# Patient Record
Sex: Male | Born: 1935
Health system: Southern US, Community
[De-identification: ages and names within clinical notes are randomized; demographics above are authoritative.]

## PROBLEM LIST (undated history)

## (undated) DIAGNOSIS — R011 Cardiac murmur, unspecified: Secondary | ICD-10-CM

## (undated) DIAGNOSIS — Z8673 Personal history of transient ischemic attack (TIA), and cerebral infarction without residual deficits: Secondary | ICD-10-CM

## (undated) DIAGNOSIS — T8859XA Other complications of anesthesia, initial encounter: Secondary | ICD-10-CM

## (undated) DIAGNOSIS — R2689 Other abnormalities of gait and mobility: Secondary | ICD-10-CM

## (undated) DIAGNOSIS — R269 Unspecified abnormalities of gait and mobility: Secondary | ICD-10-CM

## (undated) DIAGNOSIS — R112 Nausea with vomiting, unspecified: Secondary | ICD-10-CM

## (undated) DIAGNOSIS — I350 Nonrheumatic aortic (valve) stenosis: Secondary | ICD-10-CM

## (undated) DIAGNOSIS — F0390 Unspecified dementia without behavioral disturbance: Secondary | ICD-10-CM

## (undated) DIAGNOSIS — H409 Unspecified glaucoma: Secondary | ICD-10-CM

## (undated) DIAGNOSIS — Z9889 Other specified postprocedural states: Secondary | ICD-10-CM

## (undated) DIAGNOSIS — I340 Nonrheumatic mitral (valve) insufficiency: Secondary | ICD-10-CM

## (undated) DIAGNOSIS — Z9289 Personal history of other medical treatment: Secondary | ICD-10-CM

## (undated) DIAGNOSIS — E7800 Pure hypercholesterolemia, unspecified: Secondary | ICD-10-CM

## (undated) DIAGNOSIS — K922 Gastrointestinal hemorrhage, unspecified: Secondary | ICD-10-CM

## (undated) DIAGNOSIS — M1990 Unspecified osteoarthritis, unspecified site: Secondary | ICD-10-CM

## (undated) DIAGNOSIS — E785 Hyperlipidemia, unspecified: Secondary | ICD-10-CM

## (undated) DIAGNOSIS — R413 Other amnesia: Secondary | ICD-10-CM

## (undated) DIAGNOSIS — T4145XA Adverse effect of unspecified anesthetic, initial encounter: Secondary | ICD-10-CM

## (undated) DIAGNOSIS — C801 Malignant (primary) neoplasm, unspecified: Secondary | ICD-10-CM

## (undated) DIAGNOSIS — B029 Zoster without complications: Secondary | ICD-10-CM

## (undated) DIAGNOSIS — I10 Essential (primary) hypertension: Secondary | ICD-10-CM

## (undated) DIAGNOSIS — K219 Gastro-esophageal reflux disease without esophagitis: Secondary | ICD-10-CM

## (undated) DIAGNOSIS — I639 Cerebral infarction, unspecified: Secondary | ICD-10-CM

## (undated) DIAGNOSIS — N189 Chronic kidney disease, unspecified: Secondary | ICD-10-CM

## (undated) DIAGNOSIS — D649 Anemia, unspecified: Secondary | ICD-10-CM

## (undated) DIAGNOSIS — W19XXXA Unspecified fall, initial encounter: Secondary | ICD-10-CM

## (undated) HISTORY — DX: Gastro-esophageal reflux disease without esophagitis: K21.9

## (undated) HISTORY — PX: BACK SURGERY: SHX140

## (undated) HISTORY — DX: Hyperlipidemia, unspecified: E78.5

## (undated) HISTORY — DX: Other amnesia: R41.3

## (undated) HISTORY — DX: Unspecified glaucoma: H40.9

## (undated) HISTORY — PX: EYE SURGERY: SHX253

## (undated) HISTORY — DX: Personal history of transient ischemic attack (TIA), and cerebral infarction without residual deficits: Z86.73

## (undated) HISTORY — PX: APPENDECTOMY: SHX54

## (undated) HISTORY — DX: Unspecified dementia, unspecified severity, without behavioral disturbance, psychotic disturbance, mood disturbance, and anxiety: F03.90

## (undated) HISTORY — DX: Unspecified abnormalities of gait and mobility: R26.9

## (undated) HISTORY — DX: Pure hypercholesterolemia, unspecified: E78.00

## (undated) HISTORY — PX: AORTIC VALVE REPLACEMENT: SHX41

## (undated) HISTORY — DX: Anemia, unspecified: D64.9

## (undated) HISTORY — DX: Gastrointestinal hemorrhage, unspecified: K92.2

## (undated) HISTORY — DX: Essential (primary) hypertension: I10

## (undated) HISTORY — DX: Chronic kidney disease, unspecified: N18.9

## (undated) HISTORY — DX: Zoster without complications: B02.9

## (undated) HISTORY — DX: Cerebral infarction, unspecified: I63.9

## (undated) HISTORY — DX: Nonrheumatic mitral (valve) insufficiency: I34.0

## (undated) HISTORY — DX: Nonrheumatic aortic (valve) stenosis: I35.0

## (undated) HISTORY — DX: Unspecified fall, initial encounter: W19.XXXA

---

## 2000-01-19 ENCOUNTER — Ambulatory Visit (HOSPITAL_COMMUNITY): Admission: RE | Admit: 2000-01-19 | Discharge: 2000-01-19 | Payer: Self-pay | Admitting: Neurosurgery

## 2000-01-19 ENCOUNTER — Encounter: Payer: Self-pay | Admitting: Neurosurgery

## 2000-02-05 ENCOUNTER — Ambulatory Visit (HOSPITAL_COMMUNITY): Admission: RE | Admit: 2000-02-05 | Discharge: 2000-02-05 | Payer: Self-pay | Admitting: Neurosurgery

## 2000-02-05 ENCOUNTER — Encounter: Payer: Self-pay | Admitting: Neurosurgery

## 2000-02-19 ENCOUNTER — Ambulatory Visit (HOSPITAL_COMMUNITY): Admission: RE | Admit: 2000-02-19 | Discharge: 2000-02-19 | Payer: Self-pay | Admitting: Neurosurgery

## 2000-02-19 ENCOUNTER — Encounter: Payer: Self-pay | Admitting: Neurosurgery

## 2000-09-18 LAB — HM COLONOSCOPY

## 2000-12-16 ENCOUNTER — Ambulatory Visit (HOSPITAL_COMMUNITY): Admission: RE | Admit: 2000-12-16 | Discharge: 2000-12-16 | Payer: Self-pay | Admitting: Gastroenterology

## 2000-12-16 ENCOUNTER — Encounter (INDEPENDENT_AMBULATORY_CARE_PROVIDER_SITE_OTHER): Payer: Self-pay | Admitting: Specialist

## 2001-10-30 ENCOUNTER — Encounter: Admission: RE | Admit: 2001-10-30 | Discharge: 2001-10-30 | Payer: Self-pay | Admitting: Gastroenterology

## 2001-10-30 ENCOUNTER — Encounter: Payer: Self-pay | Admitting: Gastroenterology

## 2007-01-01 HISTORY — PX: CARDIOVASCULAR STRESS TEST: SHX262

## 2007-12-26 ENCOUNTER — Ambulatory Visit: Payer: Self-pay | Admitting: Vascular Surgery

## 2008-08-13 ENCOUNTER — Emergency Department (HOSPITAL_COMMUNITY): Admission: EM | Admit: 2008-08-13 | Discharge: 2008-08-13 | Payer: Self-pay | Admitting: Emergency Medicine

## 2008-11-03 ENCOUNTER — Encounter: Admission: RE | Admit: 2008-11-03 | Discharge: 2008-11-03 | Payer: Self-pay | Admitting: Neurosurgery

## 2008-11-19 ENCOUNTER — Encounter: Admission: RE | Admit: 2008-11-19 | Discharge: 2008-11-19 | Payer: Self-pay | Admitting: Neurosurgery

## 2009-03-03 ENCOUNTER — Encounter: Admission: RE | Admit: 2009-03-03 | Discharge: 2009-03-03 | Payer: Self-pay | Admitting: Neurosurgery

## 2009-04-22 ENCOUNTER — Encounter: Admission: RE | Admit: 2009-04-22 | Discharge: 2009-04-22 | Payer: Self-pay | Admitting: Neurosurgery

## 2009-06-17 ENCOUNTER — Encounter: Admission: RE | Admit: 2009-06-17 | Discharge: 2009-06-17 | Payer: Self-pay | Admitting: Neurosurgery

## 2009-08-17 ENCOUNTER — Inpatient Hospital Stay (HOSPITAL_COMMUNITY): Admission: RE | Admit: 2009-08-17 | Discharge: 2009-08-21 | Payer: Self-pay | Admitting: Neurosurgery

## 2010-07-04 ENCOUNTER — Encounter: Admission: RE | Admit: 2010-07-04 | Discharge: 2010-07-04 | Payer: Self-pay | Admitting: Endocrinology

## 2010-08-08 ENCOUNTER — Encounter
Admission: RE | Admit: 2010-08-08 | Discharge: 2010-08-08 | Payer: Self-pay | Source: Home / Self Care | Attending: Endocrinology | Admitting: Endocrinology

## 2010-08-23 ENCOUNTER — Ambulatory Visit: Payer: Self-pay | Admitting: Cardiovascular Disease

## 2010-08-27 ENCOUNTER — Encounter: Payer: Self-pay | Admitting: Orthopedic Surgery

## 2010-08-31 ENCOUNTER — Encounter: Payer: Self-pay | Admitting: Cardiovascular Disease

## 2010-08-31 ENCOUNTER — Ambulatory Visit: Admission: RE | Admit: 2010-08-31 | Discharge: 2010-08-31 | Payer: Self-pay | Source: Home / Self Care

## 2010-08-31 ENCOUNTER — Ambulatory Visit (HOSPITAL_COMMUNITY)
Admission: RE | Admit: 2010-08-31 | Discharge: 2010-08-31 | Payer: Self-pay | Source: Home / Self Care | Attending: Cardiovascular Disease | Admitting: Cardiovascular Disease

## 2010-08-31 HISTORY — PX: US ECHOCARDIOGRAPHY: HXRAD669

## 2010-10-19 ENCOUNTER — Ambulatory Visit (INDEPENDENT_AMBULATORY_CARE_PROVIDER_SITE_OTHER): Payer: Medicare Other | Admitting: Cardiovascular Disease

## 2010-10-19 DIAGNOSIS — I359 Nonrheumatic aortic valve disorder, unspecified: Secondary | ICD-10-CM

## 2010-10-19 DIAGNOSIS — R0602 Shortness of breath: Secondary | ICD-10-CM

## 2010-10-22 LAB — MRSA PCR SCREENING: MRSA by PCR: NEGATIVE

## 2010-10-22 LAB — CBC
Hemoglobin: 11.2 g/dL — ABNORMAL LOW (ref 13.0–17.0)
MCHC: 34.9 g/dL (ref 30.0–36.0)
MCV: 97.2 fL (ref 78.0–100.0)
RBC: 3.3 MIL/uL — ABNORMAL LOW (ref 4.22–5.81)
RDW: 13.8 % (ref 11.5–15.5)

## 2010-10-22 LAB — PROTIME-INR
INR: 1.15 (ref 0.00–1.49)
Prothrombin Time: 14.6 seconds (ref 11.6–15.2)

## 2010-10-22 LAB — COMPREHENSIVE METABOLIC PANEL
CO2: 25 mEq/L (ref 19–32)
Calcium: 9.3 mg/dL (ref 8.4–10.5)
Creatinine, Ser: 1.19 mg/dL (ref 0.4–1.5)
GFR calc Af Amer: >60 mL/min (ref >60)
GFR calc non Af Amer: 58 mL/min — ABNORMAL LOW (ref >60)
Glucose, Bld: 80 mg/dL (ref 70–99)
Total Protein: 7.1 g/dL (ref 6.0–8.3)

## 2010-10-22 LAB — DIFFERENTIAL
Lymphocytes Relative: 9 % — ABNORMAL LOW (ref 12–46)
Lymphs Abs: 0.4 10*3/uL — ABNORMAL LOW (ref 0.7–4.0)
Neutrophils Relative %: 79 % — ABNORMAL HIGH (ref 43–77)

## 2010-10-22 LAB — URINALYSIS, ROUTINE W REFLEX MICROSCOPIC
Hgb urine dipstick: NEGATIVE
Nitrite: NEGATIVE
Protein, ur: NEGATIVE mg/dL
Urobilinogen, UA: 1 mg/dL (ref 0.0–1.0)

## 2010-10-22 LAB — APTT: aPTT: 30 seconds (ref 24–37)

## 2010-10-23 ENCOUNTER — Inpatient Hospital Stay (HOSPITAL_BASED_OUTPATIENT_CLINIC_OR_DEPARTMENT_OTHER)
Admission: RE | Admit: 2010-10-23 | Discharge: 2010-10-23 | Disposition: A | Discharge status: Home / Self Care | Payer: Medicare Other | Source: Ambulatory Visit | Attending: Cardiovascular Disease | Admitting: Cardiovascular Disease

## 2010-10-23 DIAGNOSIS — E785 Hyperlipidemia, unspecified: Secondary | ICD-10-CM | POA: Insufficient documentation

## 2010-10-23 DIAGNOSIS — I359 Nonrheumatic aortic valve disorder, unspecified: Secondary | ICD-10-CM | POA: Insufficient documentation

## 2010-10-23 DIAGNOSIS — R0609 Other forms of dyspnea: Secondary | ICD-10-CM | POA: Insufficient documentation

## 2010-10-23 DIAGNOSIS — I10 Essential (primary) hypertension: Secondary | ICD-10-CM | POA: Insufficient documentation

## 2010-10-23 DIAGNOSIS — D649 Anemia, unspecified: Secondary | ICD-10-CM | POA: Insufficient documentation

## 2010-10-23 DIAGNOSIS — R0989 Other specified symptoms and signs involving the circulatory and respiratory systems: Secondary | ICD-10-CM | POA: Insufficient documentation

## 2010-10-23 DIAGNOSIS — R079 Chest pain, unspecified: Secondary | ICD-10-CM | POA: Insufficient documentation

## 2010-10-23 DIAGNOSIS — F0390 Unspecified dementia without behavioral disturbance: Secondary | ICD-10-CM | POA: Insufficient documentation

## 2010-10-23 HISTORY — PX: CARDIAC CATHETERIZATION: SHX172

## 2010-10-23 LAB — POCT I-STAT 3, ART BLOOD GAS (G3+)
Acid-base deficit: 1 mmol/L (ref 0.0–2.0)
Bicarbonate: 23.9 mEq/L (ref 20.0–24.0)

## 2010-10-23 LAB — POCT I-STAT 3, VENOUS BLOOD GAS (G3P V)
Acid-base deficit: 2 mmol/L (ref 0.0–2.0)
pO2, Ven: 34 mmHg (ref 30.0–45.0)

## 2010-10-23 LAB — POCT ACTIVATED CLOTTING TIME: Activated Clotting Time: 163 seconds

## 2010-11-14 NOTE — Procedures (Signed)
NAME:  Bryan Moore, Bryan Moore NO.:  [id number]  MEDICAL RECORD NO.:  [id number]           PATIENT TYPE:  LOCATION:                                 FACILITY:  PHYSICIAN:  Thayer Headings, M.D. DATE OF BIRTH:  January 16, 1920  DATE OF PROCEDURE:  10/23/2010 DATE OF DISCHARGE:                           CARDIAC CATHETERIZATION   PROCEDURE:  Right and left heart catheterization with coronary angiography and measurement of right heart pressures.  We will also do a left ventriculography.  HISTORY:  Mr. Caselli is an 77 year old gentleman with a history of mild dementia, hypertension, anemia, and hyperlipidemia.  He was recently found to have moderate-to-severe aortic stenosis.  He has developed symptoms consistent with aortic stenosis including chest pain and severe dyspnea on exertion.  We referred him for heart catheterization for further evaluation of his coronary arteries.  Our anticipation is that he hopefully can be referred for transcatheter aortic valve replacement.  PROCEDURE:  Right left heart catheterization with coronary angiography and measurement of right heart cath pressures.  The right femoral artery and the right femoral vein were easily cannulated using modified Seldinger technique.  HEMODYNAMICS:  The right atrial pressure is 11/9 with a mean of 7. Right ventricular pressure is 37/4 with a mean of 11.  Pulmonary capillary wedge pressure is 10.  Pulmonary artery pressure is 39/14 with a mean of 24.  His AO saturation is 94%, the PA saturation is 60%.  His cardiac output by thermodilution is 3.7 liters per minute.  The cardiac output by Fick calculation is 4.4 liters per minute.  His left ventricular pressure is 155/18.  On pullback, his aortic pressure was 129/55.  His aortic valve area by thermodilution is 0.69 cm2.  His aortic valve area by Fick calculation is 0.81 cm2.  Coronary angiography:  Left main.  The left main is smooth and normal.  Left  anterior descending artery:  LAD is smooth and normal.  It gives off two moderate to large diagonal vessels which are smooth and normal.  The left circumflex artery is a fairly normal vessel.  It provides flow mainly to a first obtuse marginal distribution.  This vessel is smooth and normal.  The right coronary artery is large and normal.  The posterior descending artery and the posterolateral segment artery are normal.  The left ventriculogram was performed in a 30-RAO position.  We crossed the valve using an Amplatz left 1 catheter with a straight wire.  The Amplatz catheter was traded out for pigtail to perform the left ventriculogram.  The LV gram reveals overall normal left ventricular systolic function. The ejection fraction was approximately 65%.  COMPLICATIONS:  None.  CONCLUSIONS: 1. Smooth and normal coronary arteries. 2. Moderate-to-severe aortic stenosis.  Given his symptoms, he     probably would benefit from aortic valve replacement.  Because of     his multiple problems, including his mild-to-moderate dementia, I     do not think that standard aortic valve surgery would be advisable.     He also is 77 years old.  We will discuss transcatheter aortic     valve  replacement with the family.  They have done quite a bit of     reading on this subject, and I think that this may be suitable.     Thayer Headings, M.D.     PJN/MEDQ  D:  10/23/2010  T:  10/24/2010  Job:  [id number]  cc:   Juanda Bond. [name], M.D.  Electronically Signed by Mertie Moores M.D. on 11/14/2010 09:07:53 AM

## 2010-11-22 ENCOUNTER — Other Ambulatory Visit: Payer: Self-pay | Admitting: *Deleted

## 2010-11-22 ENCOUNTER — Encounter: Payer: Self-pay | Admitting: Cardiovascular Disease

## 2010-11-22 MED ORDER — LISINOPRIL 10 MG PO TABS: 10.0000 mg | ORAL_TABLET | Freq: Every day | ORAL | Status: DC

## 2010-11-24 ENCOUNTER — Other Ambulatory Visit: Payer: Self-pay | Admitting: *Deleted

## 2010-11-24 DIAGNOSIS — I10 Essential (primary) hypertension: Secondary | ICD-10-CM

## 2010-11-24 MED ORDER — LISINOPRIL 10 MG PO TABS: 10.0000 mg | ORAL_TABLET | Freq: Every day | ORAL | Status: DC

## 2010-11-24 NOTE — Telephone Encounter (Signed)
Fax received from pharmacy. Refill completed. Jodette Tynetta Bachmann RN  

## 2010-12-13 ENCOUNTER — Other Ambulatory Visit: Payer: Self-pay | Admitting: *Deleted

## 2010-12-13 NOTE — Telephone Encounter (Signed)
Dr Acie Fredrickson was consulted, pt taking HCTZ that was left over from 2010, using it for ankle edema in afternoon. Pt told not to use and to elevate legs 3-4 times a day and to wear leg ted hose. Wife/pt verbalized understanding. F/u if  Edema continues. Corwin Levins RN

## 2010-12-13 NOTE — Telephone Encounter (Signed)
Received refill fax for hctz 25mg  and the med list doesn't have it on it on last visit. msg left to call and update me. jodette Daryana Whirley rn

## 2010-12-22 NOTE — Procedures (Signed)
[place]  Patient:    Bryan Moore, Bryan Moore                      MRN: [id number] Proc. Date: 12/16/00 Adm. Date:  [id number] Attending:  Ernie Avena CC:         Juanda Bond. [name], M.D.   Procedure Report  PROCEDURE:  Colonoscopy with polypectomy.  ENDOSCOPIST:  Cleotis Nipper, M.D.  INDICATIONS:  A 77 year old for screening.  FINDINGS:  Several small to medium sized polyps removed.  INFORMED CONSENT:  The nature, purpose, and risks of the procedure have been discussed with the patient who provided written consent.  SEDATION:  Fentanyl 40 mcg and versed 4 mg IV.  NOTE:  His heart rate did drop down into the high 30s, very transiently, while going through areas of spasm or around turns where there was more discomfort but it would come up spontaneously on its own and there is transient bradycardia, down from a baseline heart rate in the 60s was not associated with any evident clinical instability.  The Olympus pediatric adjustable tension video colonoscope was able to be advanced to the cecum through a somewhat spastic sigmoid region, turning the patient into the supine position and applying some external abdominal compression.  There is quite a bit of tortuosity and angulation and fixation in the sigmoid region perhaps with some degree of diverticulosis.  The cecum was reached and pullback was then performed.  The quality of the prep was very good and it was felt that essentially all areas were well seen although in some areas of fixation and angulation, the scope may have slid by without optimal visualization of the "inside curve."  However, it is not felt that any significant lesions would have been missed.  At or just above the cecum there was a 4 mm sessile polyp removed by snare technique.  There was a 6 mm polyp draped across the fold in the mid colon at about 70 cm which I snared off after using epinephrine  submucosally to raise a slight bleb and achieved local tissue blanching.  There was a 6 mm polyp in the distal rectum just a centimeter or 2 above the dentate line removed by snare technique as well as a couple of diminutive sessile polyps in the mid rectum removed by snare technique.  No large polyps, cancer, colitis or vascular malformations were noted.  There may have been some sigmoid diverticulosis.  The patient tolerated this procedure well and there were no apparent complications.  IMPRESSION:  Several small to medium sized polyps removed as described above.  PLAN:  Await pathology.  FOLLOWUP:  Depends on histologic findings. DD:  12/16/00 TD:  12/15/00 Job: [id number]

## 2011-03-05 ENCOUNTER — Telehealth: Payer: Self-pay | Admitting: Cardiovascular Disease

## 2011-03-05 ENCOUNTER — Telehealth: Payer: Self-pay | Admitting: *Deleted

## 2011-03-05 DIAGNOSIS — Z952 Presence of prosthetic heart valve: Secondary | ICD-10-CM

## 2011-03-05 NOTE — Telephone Encounter (Signed)
Wife has called numerous times today wanting labs and EKG done and be sent to [facility]. Received request from [place]. Will get EKG and lab Tues. Advised Dr. Acie Fredrickson is not in office until Wed so will be Wed before labs will be sent to [facility]. Fax at [facility] is [phone]

## 2011-03-05 NOTE — Telephone Encounter (Signed)
There was no note needed for this encounter.

## 2011-03-06 ENCOUNTER — Inpatient Hospital Stay (HOSPITAL_COMMUNITY)
Admission: EM | Admit: 2011-03-06 | Discharge: 2011-03-09 | DRG: 378 | Disposition: A | Discharge status: Home / Self Care | Payer: Medicare Other | Attending: Internal Medicine | Admitting: Internal Medicine

## 2011-03-06 ENCOUNTER — Telehealth: Payer: Self-pay | Admitting: *Deleted

## 2011-03-06 ENCOUNTER — Other Ambulatory Visit: Payer: Self-pay | Admitting: *Deleted

## 2011-03-06 ENCOUNTER — Other Ambulatory Visit (INDEPENDENT_AMBULATORY_CARE_PROVIDER_SITE_OTHER): Payer: Medicare Other | Admitting: *Deleted

## 2011-03-06 ENCOUNTER — Encounter (INDEPENDENT_AMBULATORY_CARE_PROVIDER_SITE_OTHER): Payer: Medicare Other | Admitting: *Deleted

## 2011-03-06 ENCOUNTER — Telehealth: Payer: Self-pay | Admitting: Cardiovascular Disease

## 2011-03-06 ENCOUNTER — Other Ambulatory Visit: Payer: Medicare Other | Admitting: *Deleted

## 2011-03-06 DIAGNOSIS — K31811 Angiodysplasia of stomach and duodenum with bleeding: Principal | ICD-10-CM | POA: Diagnosis present

## 2011-03-06 DIAGNOSIS — Z954 Presence of other heart-valve replacement: Secondary | ICD-10-CM

## 2011-03-06 DIAGNOSIS — Z7902 Long term (current) use of antithrombotics/antiplatelets: Secondary | ICD-10-CM

## 2011-03-06 DIAGNOSIS — I498 Other specified cardiac arrhythmias: Secondary | ICD-10-CM | POA: Diagnosis present

## 2011-03-06 DIAGNOSIS — Z952 Presence of prosthetic heart valve: Secondary | ICD-10-CM

## 2011-03-06 DIAGNOSIS — Z7982 Long term (current) use of aspirin: Secondary | ICD-10-CM

## 2011-03-06 DIAGNOSIS — I129 Hypertensive chronic kidney disease with stage 1 through stage 4 chronic kidney disease, or unspecified chronic kidney disease: Secondary | ICD-10-CM | POA: Diagnosis present

## 2011-03-06 DIAGNOSIS — N189 Chronic kidney disease, unspecified: Secondary | ICD-10-CM | POA: Diagnosis present

## 2011-03-06 DIAGNOSIS — I10 Essential (primary) hypertension: Secondary | ICD-10-CM

## 2011-03-06 DIAGNOSIS — D62 Acute posthemorrhagic anemia: Secondary | ICD-10-CM | POA: Diagnosis present

## 2011-03-06 DIAGNOSIS — Z880 Allergy status to penicillin: Secondary | ICD-10-CM

## 2011-03-06 LAB — CBC WITH DIFFERENTIAL/PLATELET
Basophils Relative: 0.3 % (ref 0.0–3.0)
Eosinophils Relative: 2.7 % (ref 0.0–5.0)
Hemoglobin: 6 g/dL — CL (ref 13.0–17.0)
Lymphocytes Relative: 10.8 % — ABNORMAL LOW (ref 12.0–46.0)
MCHC: 34.5 g/dL (ref 30.0–36.0)
Monocytes Relative: 12.6 % — ABNORMAL HIGH (ref 3.0–12.0)
Neutro Abs: 3.4 10*3/uL (ref 1.4–7.7)
RBC: 1.75 Mil/uL — ABNORMAL LOW (ref 4.22–5.81)

## 2011-03-06 LAB — HEPATIC FUNCTION PANEL
Albumin: 3.2 g/dL — ABNORMAL LOW (ref 3.5–5.2)
Alkaline Phosphatase: 64 U/L (ref 39–117)
Bilirubin, Direct: 0.1 mg/dL (ref 0.0–0.3)
Indirect Bilirubin: 0.1 mg/dL — ABNORMAL LOW (ref 0.3–0.9)
Total Bilirubin: 0.2 mg/dL — ABNORMAL LOW (ref 0.3–1.2)

## 2011-03-06 LAB — BASIC METABOLIC PANEL
CO2: 22 mEq/L (ref 19–32)
Calcium: 8.6 mg/dL (ref 8.4–10.5)
GFR: 43.48 mL/min — ABNORMAL LOW (ref >60.00)
Sodium: 138 mEq/L (ref 135–145)

## 2011-03-06 LAB — CK TOTAL AND CKMB (NOT AT ARMC): Relative Index: INVALID (ref 0.0–2.5)

## 2011-03-06 LAB — ABO/RH: ABO/RH(D): O POS

## 2011-03-06 LAB — CBC
Hemoglobin: 4.7 g/dL — CL (ref 13.0–17.0)
MCH: 31.5 pg (ref 26.0–34.0)
MCHC: 32.9 g/dL (ref 30.0–36.0)
Platelets: 154 10*3/uL (ref 150–400)
RDW: 15.3 % (ref 11.5–15.5)

## 2011-03-06 LAB — LACTATE DEHYDROGENASE: LDH: 203 U/L (ref 94–250)

## 2011-03-06 LAB — PHOSPHORUS: Phosphorus: 4 mg/dL (ref 2.3–4.6)

## 2011-03-06 LAB — PREPARE RBC (CROSSMATCH)

## 2011-03-06 LAB — TROPONIN I: Troponin I: <0.3 ng/mL (ref <0.30)

## 2011-03-06 LAB — PROTIME-INR
INR: 1.21 (ref 0.00–1.49)
Prothrombin Time: 15.6 seconds — ABNORMAL HIGH (ref 11.6–15.2)

## 2011-03-06 NOTE — Telephone Encounter (Signed)
Advised that I just sent labs and EKG to [facility] at fax [phone]. Advised that his HGB is 6 and if she doesn't hear from [facility] she should call them.

## 2011-03-06 NOTE — Telephone Encounter (Signed)
03/05/11 PT'S WIFE CALLED NUMEROUS TIMES TO CONFIRM FAX # AND WHETHER OR NOT WE HAD GOTTEN ORDERS FOR LABS AND WHAT SHE THOUGHT WAS AN ECHO FROM [facility]. ADVISED HER WE HAD NOT BUT WE WOULD LOOK FOR IT. AT ONE POINT, DAVID WALKER HAD EVEN FACILITATED IN CALLING [facility] TO MAKE SURE THAT THEY HAD THE CORRECT FAX #. FAX WAS FINALLY RECV LATER IN THE AFTERNOON. THE CHART WAS PULLED AND PLACED IN NURSE'S BOX TO BE FOLLOWED UP ON WITH THE PT. BY 450PM PT'S WIFE CALLED AGAIN (HAD PROB BEEN ABOUT 7 TIMES) SAYING THAT SHE STILL HAD NOT RECV A CALL BACK. ASK ANITA TO CALL PT'S WIFE. LABS AND EKG NOT ECHO WERE SET UP FOR 03/06/11. S/W ANITA THIS MORNING.

## 2011-03-06 NOTE — Telephone Encounter (Signed)
Was in this am for bmet,cbc and EKG. All faxed to [facility] at [phone]; hgb was 6. Called and spoke w/ PA at [facility]. She advised that pt go to ER here and be treated for GI bled. She will call Ms. Shultz and tell her to take him to ER. She will fax me all notes from [place]. I called [facility] and advised that he would be coming and sent labs from today from here and EKG, also records from [facility]. Faxed to ER at [phone].

## 2011-03-06 NOTE — Telephone Encounter (Signed)
Spoke w/ wife at 4:50 on 03/05/11; this was first time I was aware that I was to call pt. Set him up for CBC,Bmet,ekg for 7/31. Will fax to [facility].

## 2011-03-07 DIAGNOSIS — I359 Nonrheumatic aortic valve disorder, unspecified: Secondary | ICD-10-CM

## 2011-03-07 LAB — COMPREHENSIVE METABOLIC PANEL
Albumin: 2.9 g/dL — ABNORMAL LOW (ref 3.5–5.2)
BUN: 29 mg/dL — ABNORMAL HIGH (ref 6–23)
Calcium: 8.4 mg/dL (ref 8.4–10.5)
Chloride: 108 mEq/L (ref 96–112)
Creatinine, Ser: 1.45 mg/dL — ABNORMAL HIGH (ref 0.50–1.35)
GFR calc non Af Amer: 46 mL/min — ABNORMAL LOW (ref >60)
Total Bilirubin: 0.5 mg/dL (ref 0.3–1.2)

## 2011-03-07 LAB — CK TOTAL AND CKMB (NOT AT ARMC)
Relative Index: INVALID (ref 0.0–2.5)
Total CK: 99 U/L (ref 7–232)

## 2011-03-07 LAB — CARDIAC PANEL(CRET KIN+CKTOT+MB+TROPI)
CK, MB: 4.7 ng/mL — ABNORMAL HIGH (ref 0.3–4.0)
Relative Index: 3.8 — ABNORMAL HIGH (ref 0.0–2.5)
Troponin I: <0.3 ng/mL (ref <0.30)

## 2011-03-07 LAB — CBC
HCT: 24.9 % — ABNORMAL LOW (ref 39.0–52.0)
MCV: 91.3 fL (ref 78.0–100.0)
Platelets: 152 10*3/uL (ref 150–400)
Platelets: 162 10*3/uL (ref 150–400)
RBC: 2.3 MIL/uL — ABNORMAL LOW (ref 4.22–5.81)
RBC: 2.79 MIL/uL — ABNORMAL LOW (ref 4.22–5.81)
RDW: 16.4 % — ABNORMAL HIGH (ref 11.5–15.5)
RDW: 17.1 % — ABNORMAL HIGH (ref 11.5–15.5)
WBC: 4.4 10*3/uL (ref 4.0–10.5)
WBC: 5.7 10*3/uL (ref 4.0–10.5)

## 2011-03-07 LAB — TROPONIN I: Troponin I: <0.3 ng/mL (ref <0.30)

## 2011-03-07 LAB — PREPARE RBC (CROSSMATCH)

## 2011-03-07 LAB — TSH: TSH: 1.717 u[IU]/mL (ref 0.350–4.500)

## 2011-03-07 NOTE — Progress Notes (Signed)
Hgb noted at 6;  Per Dr. Acie Fredrickson:  It is ok for pt to go to [facility] for admission instead of [facility];  Spouse was notified

## 2011-03-08 DIAGNOSIS — I359 Nonrheumatic aortic valve disorder, unspecified: Secondary | ICD-10-CM

## 2011-03-08 LAB — BASIC METABOLIC PANEL
BUN: 27 mg/dL — ABNORMAL HIGH (ref 6–23)
CO2: 18 mEq/L — ABNORMAL LOW (ref 19–32)
Chloride: 105 mEq/L (ref 96–112)
Creatinine, Ser: 1.4 mg/dL — ABNORMAL HIGH (ref 0.50–1.35)
Glucose, Bld: 83 mg/dL (ref 70–99)

## 2011-03-08 LAB — CBC
MCH: 30.2 pg (ref 26.0–34.0)
MCHC: 34 g/dL (ref 30.0–36.0)
Platelets: 142 10*3/uL — ABNORMAL LOW (ref 150–400)
Platelets: 149 10*3/uL — ABNORMAL LOW (ref 150–400)
RDW: 16.6 % — ABNORMAL HIGH (ref 11.5–15.5)
WBC: 5.5 10*3/uL (ref 4.0–10.5)

## 2011-03-08 LAB — TSH: TSH: 2.116 u[IU]/mL (ref 0.350–4.500)

## 2011-03-08 LAB — MAGNESIUM
Magnesium: 2.3 mg/dL (ref 1.5–2.5)
Magnesium: 2.3 mg/dL (ref 1.5–2.5)

## 2011-03-08 LAB — CARDIAC PANEL(CRET KIN+CKTOT+MB+TROPI): Total CK: 116 U/L (ref 7–232)

## 2011-03-08 LAB — T4, FREE: Free T4: 1.37 ng/dL (ref 0.80–1.80)

## 2011-03-09 ENCOUNTER — Telehealth: Payer: Self-pay | Admitting: Cardiovascular Disease

## 2011-03-09 ENCOUNTER — Encounter: Payer: Self-pay | Admitting: Cardiology

## 2011-03-09 ENCOUNTER — Other Ambulatory Visit: Payer: Self-pay | Admitting: *Deleted

## 2011-03-09 DIAGNOSIS — D649 Anemia, unspecified: Secondary | ICD-10-CM

## 2011-03-09 LAB — TYPE AND SCREEN
Antibody Screen: NEGATIVE
Unit division: 0
Unit division: 0

## 2011-03-09 LAB — CBC
HCT: 31.2 % — ABNORMAL LOW (ref 39.0–52.0)
Hemoglobin: 11 g/dL — ABNORMAL LOW (ref 13.0–17.0)
MCH: 30.2 pg (ref 26.0–34.0)
Platelets: 136 10*3/uL — ABNORMAL LOW (ref 150–400)
RBC: 3.64 MIL/uL — ABNORMAL LOW (ref 4.22–5.81)
RDW: 16.2 % — ABNORMAL HIGH (ref 11.5–15.5)
RDW: 16.3 % — ABNORMAL HIGH (ref 11.5–15.5)
WBC: 4.2 10*3/uL (ref 4.0–10.5)
WBC: 6 10*3/uL (ref 4.0–10.5)

## 2011-03-09 LAB — BASIC METABOLIC PANEL
Chloride: 105 mEq/L (ref 96–112)
Creatinine, Ser: 1.54 mg/dL — ABNORMAL HIGH (ref 0.50–1.35)
GFR calc Af Amer: 52 mL/min — ABNORMAL LOW (ref >60)
Potassium: 3.9 mEq/L (ref 3.5–5.1)

## 2011-03-09 NOTE — Telephone Encounter (Signed)
Please call pt wife about low blood count #, pt's wife states pt is in the hospital now and being discharged this afternoon and nurses want his labs checked on Monday ASAP, please call Cell # [phone], pt wife states pt needs appt on this coming Monday

## 2011-03-09 NOTE — Telephone Encounter (Signed)
Previous messages sent to Rod[facility] Piety last few encounter messages

## 2011-03-12 ENCOUNTER — Telehealth: Payer: Self-pay | Admitting: *Deleted

## 2011-03-12 ENCOUNTER — Other Ambulatory Visit (INDEPENDENT_AMBULATORY_CARE_PROVIDER_SITE_OTHER): Payer: Medicare Other | Admitting: *Deleted

## 2011-03-12 DIAGNOSIS — D649 Anemia, unspecified: Secondary | ICD-10-CM

## 2011-03-12 LAB — CBC WITH DIFFERENTIAL/PLATELET
Basophils Relative: 0.2 % (ref 0.0–3.0)
Eosinophils Relative: 3.6 % (ref 0.0–5.0)
HCT: 29.5 % — ABNORMAL LOW (ref 39.0–52.0)
Hemoglobin: 9.7 g/dL — ABNORMAL LOW (ref 13.0–17.0)
Lymphs Abs: 0.4 10*3/uL — ABNORMAL LOW (ref 0.7–4.0)
MCV: 92.9 fl (ref 78.0–100.0)
Monocytes Absolute: 0.5 10*3/uL (ref 0.1–1.0)
Neutro Abs: 3.8 10*3/uL (ref 1.4–7.7)
Platelets: 156 10*3/uL (ref 150.0–400.0)
RBC: 3.18 Mil/uL — ABNORMAL LOW (ref 4.22–5.81)
WBC: 4.8 10*3/uL (ref 4.5–10.5)

## 2011-03-12 NOTE — Consult Note (Signed)
NAME:  Bryan Moore, Bryan Moore NO.:  [id number]  MEDICAL RECORD NO.:  [id number]  LOCATION:  2610                         FACILITY:  [place]  PHYSICIAN:  Denice Bors. Stanford Breed, MD, FACCDATE OF BIRTH:  [birth year]  DATE OF CONSULTATION:  03/07/2011 DATE OF DISCHARGE:                                CONSULTATION   The patient is an 77 year old male with past medical history of aortic stenosis, dementia, hypertension, hyperlipidemia, and anemia, who I am asked to evaluate for aortic stenosis.  The patient has been followed closely by Dr. Acie Fredrickson.  He underwent transcutaneous aortic valve replacement at [facility] in mid July.  He did well following discharge.  However, he was admitted on July 31 with complaints of progressive weakness, dyspnea on exertion, and melena.  His hemoglobin was found to be 6 and decreased to 4.7.  He has been transfused and GI evaluation is in progress.  He was also on aspirin and Plavix following his procedure and Cardiology is asked to further evaluate.  Note, he denies any chest pain, palpitations, or syncope.  MEDICATIONS AT PRESENT:  Include, eye drops, Lasix 20 mg IV x1, Protonix 40 mg IV b.i.d., and ondansetron as needed.  ALLERGIES:  He has an allergy to PENICILLIN.  SOCIAL HISTORY:  He is married and lives with his wife.  He does not smoke or consume alcohol by his report.  FAMILY HISTORY:  Significant for coronary artery disease in his brother.  PAST MEDICAL HISTORY:  Significant for hypertension, hyperlipidemia. There is no diabetes mellitus.  He does have a history of anemia and previous gastritis by endoscopy.  He had transcatheter aortic valve replacement on February 13, 2011.  He has had a prior appendectomy and lumbar surgery.  REVIEW OF SYSTEMS:  He denies any headaches, fevers, or chills.  There is no productive cough or hemoptysis.  There is no dysphagia, odynophagia.  He has had melena, but no hematochezia.  There is  no dysuria or hematuria.  There is no rash or seizure activity.  There is no orthopnea, PND, or pedal edema by his report.  Remaining systems are negative.  PHYSICAL EXAM:  VITAL SIGNS:  His temperature is 97.9, his blood pressure is 141/64, and his pulse is 84. GENERAL:  He is well developed and somewhat frail.  He is in no acute distress at present.  He is not to be depressed.  He has some lethargic, although, answers questions. SKIN:  Pallor.  Otherwise, warm and dry. BACK:  Normal.  There is no clubbing. HEENT:  Normal eyelids. NECK:  Supple with normal upstroke bilaterally.  I cannot appreciate bruits.  There is no jugular venous distention and no thyromegaly noted. CHEST:  Clear to auscultation, normal expansion. CARDIOVASCULAR:  Regular rhythm.  There is a 2/6 systolic murmur at left sternal border.  There is a 3/6 diastolic murmur. ABDOMEN:  Nontender, nondistended.  Positive bowel sounds.  No hepatosplenomegaly.  No mass appreciated.  There is no abdominal bruit. EXTREMITIES:  He has 2+ femoral pulses bilaterally.  No bruits. Extremities show trace edema.  I cannot palpate cords.  His distal pulses are diminished. NEUROLOGIC:  He does move all extremities  and his cranial nerves intact. However, his memory is somewhat decreased.  He is mildly lethargic after being awakened from sleep.  He initially was unaware of the year and also what city he was in.  There are no gross findings.  LABORATORIES:  His initial hemoglobin was 6 and decreased to 4.7.  It has improved with transfusion and was 7.1 this morning and further transfuse is being planned.  His cardiac markers were checked and were negative.  BUN and creatinine today are 29 and 1.45.  Potassium is 4.1. Liver functions are normal.  TSH was normal.  Electrocardiogram shows a sinus rhythm with a left bundle-branch block.  There is an occasional PAC.  DIAGNOSES: 1. Gastrointestinal bleed - I agree with holding the  patient's aspirin     and Plavix as he has been significantly anemic with a hemoglobin     down to 4.7.  These will need to be held until his gastrointestinal     workup is complete and until it is clear that his gastrointestinal     bleed has resolved.  I agree with continuing Protonix.  Upper     endoscopy is planned.  We will transfuse as needed to keep his     hemoglobin at 9 or better. 2. Status post recent transcatheter aortic valve replacement - his     aspirin, Plavix, we placed on hold given his ongoing significant     gastrointestinal bleed. 3. Hypertension - we will follow this and adjust his medications as     needed. 4. Hyperlipidemia.  We will be happy to follow while he is in the     hospital.     Denice Bors. Stanford Breed, MD, [facility]     BSC/MEDQ  D:  03/07/2011  T:  03/07/2011  Job:  [id number]  Electronically Signed by Kirk Ruths MD [facility] on 03/12/2011 03:54:26 PM

## 2011-03-12 NOTE — Telephone Encounter (Addendum)
Spoke with wife and results given of cbc / low hgb, sent to dr buccini and they were called with results and to inform dr/ spoke with rebecca

## 2011-03-13 NOTE — Telephone Encounter (Signed)
Agree.  Hb has fallen slightly.

## 2011-03-19 ENCOUNTER — Other Ambulatory Visit: Payer: Self-pay | Admitting: *Deleted

## 2011-03-19 DIAGNOSIS — E7800 Pure hypercholesterolemia, unspecified: Secondary | ICD-10-CM

## 2011-03-20 NOTE — Discharge Summary (Signed)
NAME:  Bryan Moore, Bryan Moore NO.:  [id number]  MEDICAL RECORD NO.:  [id number]  LOCATION:  2610                         FACILITY:  [place]  PHYSICIAN:  Niel Hummer, MD    DATE OF BIRTH:  [birth year]  DATE OF ADMISSION:  03/06/2011 DATE OF DISCHARGE:  03/09/2011                        DISCHARGE SUMMARY - REFERRING   DISCHARGE DIAGNOSES: 1. Acute blood loss, gastrointestinal bleed secondary possible to     arteriovenous malformation. 2. Acute-on-chronic renal insufficiency probably secondary to     decreased perfusion secondary to anemia. 3. Aortic stenosis with recent transcatheter aortic valve replacement. 4. Hypertension. 5. Status post lumbar spinal surgery. 6. Bradycardia in the setting of beta-blocker.  DISCHARGE MEDICATIONS: 1. Alphagan 0.1% one drop in each eye b.i.d. 2. Calcium carbonate 1 tablet p.o. daily. 3. Timolol 2/0.5% one drop in each eye b.i.d. 4. Multivitamins 1 tablet daily. 5. Protonix 40 mg p.o. daily. 6. Diovan 150 mg p.o. daily. 7. Vitamin B12 one tablet by mouth daily. 8. Vitamin D3 one tablet by mouth daily. 9. Xalatan one drop in each eye daily.  Medications stopped during this hospitalization are: 1. Lisinopril. 2. Hydrochlorothiazide. 3. Potassium. 4. Plavix.  The patient will be started on aspirin 81 mg on Sunday.  DISPOSITION AND FOLLOWUP:  Bryan Moore will need to follow at [facility]. During that appointment, they will need to check his blood counts again. They will need to discuss with the patient and see if the patient can be off Plavix for 2 weeks.  The patient is at risk for bleeding due to this AVM.  The patient will be started on aspirin 81 mg in 2 or 3 days.  So, he will be started on Sunday.  He will need to follow with his primary care physician on Monday to have hemoglobin checked.  The patient was advised to return to the hospital if he had any blood in the stool.  The patient wanted to go home today.  I explained  risks and benefits to the wife and the patient.  STUDIES PERFORMED: 1. Endoscopy showed AVM.  The patient had APC laser and Endoclip. 2. A 2-D echo pending at this time.  BRIEF HISTORY OF PRESENT ILLNESS:  This is an 77 year old with history of aortic stenosis, status post transcatheter aortic valve replacement. He was seen by his Cardiology and he was sent to the ED due to severe anemia.  The patient has been on Plavix.  He denies chest pain and shortness of breath.  His last hemoglobin was around 10.  He was having some black stool.  On admission, his hemoglobin was found to be at 6.0. A repeated one was at 4.7.  HOSPITAL COURSE: 1. Acute blood loss secondary to gastrointestinal bleed probably     secondary to arteriovenous malformation.  The patient was admitted     to the step-down unit.  He received a total of 4 units of packed     red blood cell.  His hemoglobin at some point was at 4.7.  He was     started on Protonix.  Plavix was discontinued. I spoke with the PA      of the patient's surgeon at [facility]  and she spoke     with the surgeon, Dr. Aline Brochure.  They were okay on holding Plavix.     They said that sometimes the patient can be off of Plavix.  They     were recommending to start aspirin as soon as possible.  I spoke with     Dr. Amedeo Plenty and he was okay to start aspirin in 2-3 days.  The     patient will need a very close follow-up.  The decision to restart     Plavix, we are going to leave that to physician at [facility], now in     that the patient is at high risk for bleed.  Dr. Amedeo Plenty was recommending [place], Plavix. He said that if bleed recurs, we can consider capsule endoscopy or colonoscopy.  He said that the bleeding may represent single gastric arteriovenous malformation although the patient might have others AVM.  1. History of aortic stenosis, recent transcatheter valve repair.  The     patient will be started on aspirin in 2 or 3 days.  He will be     restarted on  aspirin on Sunday.  We will need to monitor hemoglobin     closely.  He will need a hemoglobin on Monday.  I spoke with the     patient and the wife and they want to go home.  They understand the     risks of recurrent bleed.  He will need to follow at [facility]. 2. Hypertension.  We are going to hold his blood pressure medication.     His blood pressure has been in the 110 range.  Hold lisinopril due     to acute mild renal insufficiency.  Also holding     hydrochlorothiazide due to acute mild renal insufficiency. 3. Bradycardia.  The patient developed a heart rate in the 40s.  He     was receiving beta-blocker.  He was started on beta-blocker during     the hospitalization because his heart rate increased into the 130s.     We will discharge him off of any beta blocker.  Dr. Acie Fredrickson will see     the patient today and if it is okay from Dr. Acie Fredrickson and if it is     okay from cardiac point of view, the patient will be discharged     home today. 4. Renal insufficiency.  Creatinine peak is 1.6, has decreased to 1.5,     has been stable.  I will hold ACE at this time.  I will also hold     hydrochlorothiazide.  He will need a very close follow-up and a     BMET.  He will need to follow his primary care physician and to     consider restart lisinopril.  LABORATORY DATA:  On the day of discharge, the patient was in a stable condition.  Hemoglobin 10, platelet 157.  Sodium 137, potassium 3.9, chloride 105, bicarb 24, BUN 22, creatinine 1.5, glucose 85.  PHYSICAL EXAMINATION:  VITAL SIGNS:  Pulse 60, blood pressure 111/45, respirations 17, sat 100% on room air.     Niel Hummer, MD     BR/MEDQ  D:  03/09/2011  T:  03/09/2011  Job:  [id number]  cc:   Juanda Bond. [name], M.D. Carole Civil, M.D.  Electronically Signed by Niel Hummer MD on 03/20/2011 01:53:48 PM

## 2011-03-26 ENCOUNTER — Other Ambulatory Visit: Payer: Self-pay | Admitting: *Deleted

## 2011-03-26 MED ORDER — PANTOPRAZOLE SODIUM 40 MG PO TBEC: 40.0000 mg | DELAYED_RELEASE_TABLET | Freq: Every day | ORAL | Status: DC

## 2011-03-26 NOTE — Telephone Encounter (Signed)
Fax received from pharmacy. Refill completed. Jodette Rosealynn Mateus RN  

## 2011-03-27 ENCOUNTER — Other Ambulatory Visit (INDEPENDENT_AMBULATORY_CARE_PROVIDER_SITE_OTHER): Payer: Medicare Other | Admitting: *Deleted

## 2011-03-27 DIAGNOSIS — E7800 Pure hypercholesterolemia, unspecified: Secondary | ICD-10-CM

## 2011-03-27 LAB — LIPID PANEL
LDL Cholesterol: 118 mg/dL — ABNORMAL HIGH (ref 0–99)
Total CHOL/HDL Ratio: 4

## 2011-03-27 LAB — HEPATIC FUNCTION PANEL
ALT: 14 U/L (ref 0–53)
AST: 25 U/L (ref 0–37)
Bilirubin, Direct: 0.1 mg/dL (ref 0.0–0.3)
Total Bilirubin: 0.5 mg/dL (ref 0.3–1.2)

## 2011-03-27 LAB — BASIC METABOLIC PANEL
Chloride: 106 mEq/L (ref 96–112)
GFR: 51.57 mL/min — ABNORMAL LOW (ref >60.00)
Potassium: 4.6 mEq/L (ref 3.5–5.1)
Sodium: 138 mEq/L (ref 135–145)

## 2011-03-27 NOTE — H&P (Signed)
NAME:  Bryan Moore, Bryan Moore NO.:  [id number]  MEDICAL RECORD NO.:  [id number]  LOCATION:  2610                         FACILITY:  [facility]  PHYSICIAN:  Reyne Dumas, MD       DATE OF BIRTH:  09-08-1919  DATE OF ADMISSION:  03/06/2011 DATE OF DISCHARGE:                             HISTORY & PHYSICAL   PRIMARY CARE PHYSICIAN:  Juanda Bond. [name], MD  CHIEF COMPLAINT:  GI bleeding.  SUBJECTIVE:  This is an 77 year old male with severe symptomatic aortic stenosis who was sent over for evaluation by [facility] for his severe anemia.  The patient has complained of generalized weakness, decreased strength, and dark stools.  He had a recent transcatheter aortic valve replacement done on February 13, 2011.  Since then, he has been placed on Plavix.  He is not on any anticoagulation at this time.  The patient denied any chest pain per se.  Denies any shortness of breath. The patient has also been taking iron for his anemia.  His last known hemoglobin was around 10.2.  The patient denies any fever, chills, rigors, or cough.  Denies any nausea, vomiting, abdominal pain, any urinary urgency, frequency, or dysuria.  ALLERGIES:  PENICILLIN.  HOME MEDICATIONS:  Alphagan, hydrochlorothiazide, iron, lisinopril, Plavix, Protonix, timolol, and Xalatan.  PAST MEDICAL HISTORY: 1. Severe aortic stenosis. 2. Hypertension. 3. Hyperlipidemia. 4. Glaucoma. 5. Iron-deficiency anemia. 6. Recent gastritis confirmed by upper endoscopy in the setting of his     NSAIDs use.  PAST SURGICAL HISTORY: 1. Transcatheter aortic valve replacement on February 13, 2011. 2. Appendectomy. 3. Lumbar spinal surgery in 2011.  SOCIAL HISTORY:  The patient is married and lives with his wife.  He is a retired Hotel manager.  Recently smoked a pipe on social occasion.  FAMILY HISTORY:  History of paternal coronary artery disease as well as coronary artery disease in the brother with no family history  of valvular heart disease, aortic disease, bicuspid aortic valve with sudden unexplained cardiac death.  PHYSICAL EXAMINATION:  VITAL SIGNS:  Blood pressure 126/47, pulse of 78, respirations 18, temperature 97.4, and 100% on room air. GENERAL:  Comfortable in no acute cardiopulmonary distress. HEENT:  Pupils equal and reactive.  Extraocular movements are intact. NECK:  Supple.  No JVD. LUNGS:  Clear to auscultation bilaterally.  No wheezes, crackles, or rhonchi. CARDIOVASCULAR:  Regular rate and rhythm.  No murmurs, rubs, or gallops. ABDOMEN:  Obese, soft, nontender, and nondistended. EXTREMITIES:  Without cyanosis, clubbing, or edema. NEUROLOGIC:  Cranial nerves II-XII grossly intact. PSYCHIATRIC:  Appropriate mood and affect.  LABORATORY DATA:  EKG shows normal sinus rhythm with a left bundle- branch block.  LDH was 203.  Liver function tests are within normal limits.  Fecal occult blood is positive.  WBC 4.6, hemoglobin 6.0, hematocrit 17.4, and platelet count of 194.  Basic metabolic panel shows a sodium of 138, chloride 109, potassium 5.1, bicarb 22, glucose 85, BUN 35, creatinine 1.6, and calcium 8.6.  ASSESSMENT AND PLAN: 1. Microcytic anemia secondary to gastrointestinal bleeding.  Most     recent esophagogastroduodenoscopy was done on September 01, 2010 that     showed a few angiodysplastic lesions  in the greater curvature of     the stomach.  The patient had a last colonoscopy done in 2009 that     showed colon polyps and diverticulosis. 2. Acute on chronic kidney disease. 3. Aortic stenosis with recent transcatheter aortic valve replacement. 4. Hypertension. 5. Status post lumbar spinal surgery.  PLAN:  The patient will be admitted to Step-Down.  We will monitor serial CBCs.  We will transfuse him with 2 units of packed red blood cells.  We will hold his Plavix.  We will start him on the Protonix drip.  Dr. Cristina Gong to evaluate him in the morning for an upper endoscopy.   He will be kept n.p.o. past midnight.  Currently, the patient is a full code.     Reyne Dumas, MD     NA/MEDQ  D:  03/06/2011  T:  03/06/2011  Job:  [id number]  Electronically Signed by Reyne Dumas MD on 03/27/2011 10:54:39 PM

## 2011-03-28 ENCOUNTER — Ambulatory Visit (INDEPENDENT_AMBULATORY_CARE_PROVIDER_SITE_OTHER): Payer: Medicare Other | Admitting: Cardiovascular Disease

## 2011-03-28 ENCOUNTER — Encounter: Payer: Self-pay | Admitting: Cardiovascular Disease

## 2011-03-28 DIAGNOSIS — I1310 Hypertensive heart and chronic kidney disease without heart failure, with stage 1 through stage 4 chronic kidney disease, or unspecified chronic kidney disease: Secondary | ICD-10-CM | POA: Insufficient documentation

## 2011-03-28 DIAGNOSIS — D649 Anemia, unspecified: Secondary | ICD-10-CM

## 2011-03-28 DIAGNOSIS — I10 Essential (primary) hypertension: Secondary | ICD-10-CM

## 2011-03-28 DIAGNOSIS — E785 Hyperlipidemia, unspecified: Secondary | ICD-10-CM | POA: Insufficient documentation

## 2011-03-28 DIAGNOSIS — I359 Nonrheumatic aortic valve disorder, unspecified: Secondary | ICD-10-CM

## 2011-03-28 NOTE — Assessment & Plan Note (Addendum)
Secondary to AV malformations.  We'll recheck a CBC again in 3 months.  I would prefer that he take aspirin if at all possible to help inhibit his platelets with this aortic valve. If he continues to have lots of bleeding, we may need to stop the aspirin. One option would be to take the aspirin every other day which may minimize any gastritis that he might have.

## 2011-03-28 NOTE — Assessment & Plan Note (Signed)
Overall he seems to be doing quite well following his transcatheter aortic valve replacement. He has aortic insufficiency on exam and by echocardiography. Overall it is improved from where were several months ago.  He seems to be recovering quite nicely but has to slow down a little bit by his anemia. He apparently has had multiple AVMs. Several of these have been treated but he continues to have some slow bleeding. These will be followed by Dr. Cristina Gong

## 2011-03-28 NOTE — Progress Notes (Signed)
Bryan Moore Date of Birth  08/06/1920 [facility] [id number], [place]  [address] [phone]  Fax  [phone]  History of Present Illness:  77 year old gentleman with a history of aortic stenosis. He recently had an aortic valve replacement (TAVI) .  He has chronic edema formations and his stomach and has had some bleeding.  He had an EGD and had several of these lesions clipped  and/or cauterized. He has already seen Dr. Cristina Gong  in followup and still has some blood in his stool indicating that he has some additional AVMs that have not yet been adequately treated.  He's been on iron therapy.  Current Outpatient Prescriptions on File Prior to Visit  Medication Sig Dispense Refill  . pantoprazole (PROTONIX) 40 MG tablet Take 1 tablet (40 mg total) by mouth daily.  30 tablet  4  . lisinopril (PRINIVIL,ZESTRIL) 10 MG tablet Take 1 tablet (10 mg total) by mouth daily.  30 tablet  11    Allergies  Allergen Reactions  . Penicillins     No past medical history on file.  No past surgical history on file.  History  Smoking status  . Former Smoker  Smokeless tobacco  . Not on file    History  Alcohol Use     No family history on file.  Reviw of Systems:  Reviewed in the HPI.  All other systems are negative.  Physical Exam: BP 138/58  Pulse 78  Ht 5\' 8"  (1.727 m)  Wt 156 lb 12.8 oz (71.124 kg)  BMI 23.84 kg/m2 The patient is alert and oriented x 3.  The mood and affect are normal.   Skin: warm and dry.  Color is normal.    HEENT:   the sclera are nonicteric.  The mucous membranes are moist.  The carotids are 2+ without bruits.  There is no thyromegaly.  There is no JVD.    Lungs: clear.  The chest wall is non tender.    Heart: regular rate with a normal S1 and S2.  There is a 2/6 diastolic murmur with a soft systolic murmur. The PMI is not displaced.     Abdomen: good bowel sounds.  There is no  guarding or rebound.  There is no hepatosplenomegaly or tenderness.  There are no masses.   Extremities:  no clubbing, cyanosis, or edema.  The legs are without rashes.  The distal pulses are 1-2+ in his feet.   Neuro:  Cranial nerves II - XII are intact.  Motor and sensory functions are intact.    The gait is normal.  Assessment / Plan:

## 2011-03-29 ENCOUNTER — Telehealth: Payer: Self-pay | Admitting: *Deleted

## 2011-03-29 NOTE — Telephone Encounter (Signed)
Faxed order for cardiac rehab today,Jodette [facility] RN

## 2011-04-03 ENCOUNTER — Inpatient Hospital Stay (HOSPITAL_COMMUNITY)
Admission: EM | Admit: 2011-04-03 | Discharge: 2011-04-07 | DRG: 378 | Disposition: A | Discharge status: Home / Self Care | Payer: Medicare Other | Attending: Internal Medicine | Admitting: Internal Medicine

## 2011-04-03 DIAGNOSIS — E785 Hyperlipidemia, unspecified: Secondary | ICD-10-CM | POA: Diagnosis present

## 2011-04-03 DIAGNOSIS — Z954 Presence of other heart-valve replacement: Secondary | ICD-10-CM

## 2011-04-03 DIAGNOSIS — K31819 Angiodysplasia of stomach and duodenum without bleeding: Secondary | ICD-10-CM | POA: Diagnosis present

## 2011-04-03 DIAGNOSIS — N179 Acute kidney failure, unspecified: Secondary | ICD-10-CM | POA: Diagnosis present

## 2011-04-03 DIAGNOSIS — D62 Acute posthemorrhagic anemia: Secondary | ICD-10-CM | POA: Diagnosis present

## 2011-04-03 DIAGNOSIS — K922 Gastrointestinal hemorrhage, unspecified: Principal | ICD-10-CM | POA: Diagnosis present

## 2011-04-03 DIAGNOSIS — Z7982 Long term (current) use of aspirin: Secondary | ICD-10-CM

## 2011-04-03 DIAGNOSIS — Z79899 Other long term (current) drug therapy: Secondary | ICD-10-CM

## 2011-04-03 DIAGNOSIS — H409 Unspecified glaucoma: Secondary | ICD-10-CM | POA: Diagnosis present

## 2011-04-03 DIAGNOSIS — Z880 Allergy status to penicillin: Secondary | ICD-10-CM

## 2011-04-03 DIAGNOSIS — IMO0002 Reserved for concepts with insufficient information to code with codable children: Secondary | ICD-10-CM | POA: Diagnosis present

## 2011-04-03 LAB — BASIC METABOLIC PANEL
BUN: 30 mg/dL — ABNORMAL HIGH (ref 6–23)
Chloride: 104 mEq/L (ref 96–112)
GFR calc Af Amer: 51 mL/min — ABNORMAL LOW (ref >60)
GFR calc non Af Amer: 42 mL/min — ABNORMAL LOW (ref >60)
Potassium: 4.3 mEq/L (ref 3.5–5.1)
Sodium: 139 mEq/L (ref 135–145)

## 2011-04-03 LAB — CBC
Hemoglobin: 7.2 g/dL — ABNORMAL LOW (ref 13.0–17.0)
MCH: 30.3 pg (ref 26.0–34.0)
MCHC: 32.9 g/dL (ref 30.0–36.0)
MCV: 92 fL (ref 78.0–100.0)

## 2011-04-03 LAB — OCCULT BLOOD, POC DEVICE: Fecal Occult Bld: POSITIVE

## 2011-04-03 LAB — DIFFERENTIAL
Basophils Relative: 0 % (ref 0–1)
Eosinophils Absolute: 0.1 10*3/uL (ref 0.0–0.7)
Lymphs Abs: 0.5 10*3/uL — ABNORMAL LOW (ref 0.7–4.0)
Monocytes Absolute: 0.7 10*3/uL (ref 0.1–1.0)
Monocytes Relative: 14 % — ABNORMAL HIGH (ref 3–12)

## 2011-04-03 LAB — PROTIME-INR: Prothrombin Time: 14.1 seconds (ref 11.6–15.2)

## 2011-04-04 ENCOUNTER — Encounter (HOSPITAL_COMMUNITY): Payer: Medicare Other

## 2011-04-04 LAB — CBC
HCT: 27.2 % — ABNORMAL LOW (ref 39.0–52.0)
HCT: 28.3 % — ABNORMAL LOW (ref 39.0–52.0)
HCT: 27.7 % — ABNORMAL LOW (ref 39.0–52.0)
Hemoglobin: 8.9 g/dL — ABNORMAL LOW (ref 13.0–17.0)
Hemoglobin: 9.4 g/dL — ABNORMAL LOW (ref 13.0–17.0)
Hemoglobin: 9.2 g/dL — ABNORMAL LOW (ref 13.0–17.0)
MCH: 30.4 pg (ref 26.0–34.0)
MCH: 30.4 pg (ref 26.0–34.0)
MCHC: 33.5 g/dL (ref 30.0–36.0)
MCHC: 33.2 g/dL (ref 30.0–36.0)
MCV: 91 fL (ref 78.0–100.0)
MCV: 91.4 fL (ref 78.0–100.0)
Platelets: 165 10*3/uL (ref 150–400)
RBC: 2.93 MIL/uL — ABNORMAL LOW (ref 4.22–5.81)
RBC: 3.11 MIL/uL — ABNORMAL LOW (ref 4.22–5.81)
RDW: 15.6 % — ABNORMAL HIGH (ref 11.5–15.5)
WBC: 4.9 10*3/uL (ref 4.0–10.5)
WBC: 4.7 10*3/uL (ref 4.0–10.5)

## 2011-04-04 LAB — TYPE AND SCREEN
Antibody Screen: NEGATIVE
Unit division: 0

## 2011-04-04 LAB — COMPREHENSIVE METABOLIC PANEL
Albumin: 3.1 g/dL — ABNORMAL LOW (ref 3.5–5.2)
Alkaline Phosphatase: 69 U/L (ref 39–117)
BUN: 23 mg/dL (ref 6–23)
Calcium: 8.5 mg/dL (ref 8.4–10.5)
Creatinine, Ser: 1.36 mg/dL — ABNORMAL HIGH (ref 0.50–1.35)
Potassium: 3.8 mEq/L (ref 3.5–5.1)
Total Protein: 6.6 g/dL (ref 6.0–8.3)

## 2011-04-05 ENCOUNTER — Encounter (HOSPITAL_COMMUNITY): Payer: Medicare Other

## 2011-04-05 LAB — CBC
HCT: 25.8 % — ABNORMAL LOW (ref 39.0–52.0)
MCH: 29.9 pg (ref 26.0–34.0)
MCHC: 33.2 g/dL (ref 30.0–36.0)
MCV: 90.8 fL (ref 78.0–100.0)
MCV: 92.3 fL (ref 78.0–100.0)
Platelets: 153 10*3/uL (ref 150–400)
Platelets: 175 10*3/uL (ref 150–400)
RDW: 15.6 % — ABNORMAL HIGH (ref 11.5–15.5)
RDW: 15.8 % — ABNORMAL HIGH (ref 11.5–15.5)
WBC: 5.3 10*3/uL (ref 4.0–10.5)

## 2011-04-05 LAB — BASIC METABOLIC PANEL
BUN: 18 mg/dL (ref 6–23)
Calcium: 8.3 mg/dL — ABNORMAL LOW (ref 8.4–10.5)
Creatinine, Ser: 1.22 mg/dL (ref 0.50–1.35)
GFR calc Af Amer: >60 mL/min (ref >60)

## 2011-04-06 LAB — BASIC METABOLIC PANEL
Calcium: 8.5 mg/dL (ref 8.4–10.5)
GFR calc Af Amer: >60 mL/min (ref >60)
GFR calc non Af Amer: >60 mL/min (ref >60)
Glucose, Bld: 88 mg/dL (ref 70–99)
Potassium: 4.4 mEq/L (ref 3.5–5.1)
Sodium: 138 mEq/L (ref 135–145)

## 2011-04-06 LAB — CBC
Hemoglobin: 8.7 g/dL — ABNORMAL LOW (ref 13.0–17.0)
MCH: 30.3 pg (ref 26.0–34.0)
MCHC: 33.2 g/dL (ref 30.0–36.0)
Platelets: 150 10*3/uL (ref 150–400)
RBC: 2.87 MIL/uL — ABNORMAL LOW (ref 4.22–5.81)

## 2011-04-07 LAB — BASIC METABOLIC PANEL
CO2: 21 mEq/L (ref 19–32)
Calcium: 8.6 mg/dL (ref 8.4–10.5)
Chloride: 109 mEq/L (ref 96–112)
Glucose, Bld: 81 mg/dL (ref 70–99)
Potassium: 4 mEq/L (ref 3.5–5.1)
Sodium: 141 mEq/L (ref 135–145)

## 2011-04-07 LAB — CBC
Hemoglobin: 8.1 g/dL — ABNORMAL LOW (ref 13.0–17.0)
MCH: 30.6 pg (ref 26.0–34.0)
Platelets: 136 10*3/uL — ABNORMAL LOW (ref 150–400)
RBC: 2.65 MIL/uL — ABNORMAL LOW (ref 4.22–5.81)
WBC: 3.6 10*3/uL — ABNORMAL LOW (ref 4.0–10.5)

## 2011-04-12 NOTE — Discharge Summary (Signed)
NAME:  Bryan Moore, MUCK.:  [id number]  MEDICAL RECORD NO.:  [id number]  LOCATION:  [id number]                         FACILITY:  [facility]  PHYSICIAN:  [name], MD    DATE OF BIRTH:  06/24/1920  DATE OF ADMISSION:  04/03/2011 DATE OF DISCHARGE:  04/07/2011                        DISCHARGE SUMMARY    GASTROENTEROLOGIST:  Dr. Cristina Gong.  PRIMARY CARE PHYSICIAN:  Dr. Legrand Como [name].  DISCHARGE DIAGNOSES: 1. Recurrent symptomatic anemia/acute blood loss anemia, status post 2     units packed red blood cells, improved. 2. Acute renal failure, resolved. 3. Aortic stenosis, status post aortic valve replacement via     transcatheter replacement February 13, 2011. 4. Prior history of significant gastrointestinal bleed with acute     blood loss anemia July 31 through March 09, 2011, felt to be     secondary to arteriovenous malformation. 5. Status post lumbar surgery. 6. History of bradycardia in Bryan setting of beta-blocker. 7. History of glaucoma. 8. Hyperlipidemia. 9. History of gastritis felt to be secondary to NSAID use.  DISCHARGE MEDICATIONS: 1. Lasix 20 mg p.o. daily to resume in 1-2 days. 2. Alphagan 0.1% ophthalmic solution 1 drop to each eye twice daily. 3. Aspirin 81 mg p.o. daily. 4. Dorzolamide eyedrops 1 drop in each eye twice daily. 5. Multivitamin 1 tablet p.o. daily. 6. Protonix 40 mg p.o. daily. 7. Vitamin B12 one tablet p.o. daily. 8. Vitamin D3 one tablet p.o. daily. 9. Dilantin ophthalmic 0.005% one drop to each eye q.h.s.  DISPOSITION AND FOLLOWUP:  Bryan Moore will be discharged home.  Bryan Moore is to follow up at [place] office for a CBC to be checked on Tuesday April 10, 2011.  Bryan Moore is also to schedule a follow-up appointment with Dr. Cristina Gong in 1 week post discharge and follow-up CBC will need to be checked to follow up on Bryan Moore's H and H.  A BMET will also need to be checked and further evaluation of Bryan  Moore's anemia will be determined per his gastroenterologist, may consider p.r.n. transfusions.  CONSULTATIONS DONE:  A GI consultation was done.  Bryan Moore was seen in consultation by Dr. Penelope Coop on April 04, 2011.  PROCEDURES PERFORMED:  Bryan Moore was transfused 2 units packed red blood cells during this hospitalization.  Upper endoscopy was done on April 05, 2011, that showed 2 small arteriovenous malformations on Bryan greater curvature of Bryan stomach which were not bleeding but cauterized with argon plasma coagulator due to Bryan Moore's history.  Bryan previously placed EndoClip also was in place.  BRIEF ADMISSION HISTORY AND PHYSICAL:  Bryan Moore is an 77 year old Caucasian gentleman who is known to have recurrent GI bleeds, last admitted July 31 through August 03 where he was found to have a gastric arteriovenous malformation.  His gastrointestinal bleed was thought to be secondary to NSAID use in Bryan past on his last admission as well as due to Plavix.  Bryan Moore has had a transcatheter aortic valve replacement.  Bryan Moore had been doing fine since discharge; and when he was last discharged, his hemoglobin was greater than 9.  Bryan Moore presented back to [facility] ED after lab  work done at [facility] office showed a hemoglobin of 7.7.  In Bryan ED, hemoglobin was down to 7.2.  Bryan Moore had been feeling weak and tired especially in lower extremities. He denied any shortness of breath.  No leg swelling.  No chest pain.  No hematemesis.  No frank bleed.  His stool guaiac was, however, positive. For Bryan rest of admission history and physical, please see H and P that was dictated by Dr. Jonelle Sidle of job [phone].  HOSPITAL COURSE: 1. Recurrent symptomatic anemia/acute blood loss anemia.  Bryan Moore     had presented with recurrent symptomatic anemia/acute blood loss     anemia.  Bryan Moore was placed on tele bed.  He was hydrated with     IV fluids and transfused 2  units of packed red blood cells with     appropriate response in his hemoglobin.  Bryan Moore was placed on     IV Protonix.  He was monitored further, initially kept on clear     liquids.  A GI consultation was obtained.  Bryan Moore was seen in     consultation by Dr. Penelope Coop on April 04, 2011, at which time it was     recommended that Bryan Moore be scheduled for upper endoscopy.  Bryan     Moore did have an upper endoscopy done on April 05, 2011, with     results as stated above which showed 2 small AVMs in Bryan greater     curvature of Bryan stomach which were not bleeding, but they were     cauterized secondary to Bryan Moore's prior history.  Bryan     previously placed EndoClip was also noted to be in place.  Bryan     Moore remained in stable condition.  Bryan Moore improved     clinically and symptomatically.  He did not have any further dizzy     spells.  Bryan Moore was tolerating his diet.  His hemoglobin     remained stable.  Bryan Moore will be discharged in stable and     improved condition.  Bryan Moore will follow up with Dr. [facility]     office in April 10, 2011, for blood workup at which point in     time he needs a CBC drawn.  Bryan Moore will also to call to     schedule appointment with Dr. Cristina Gong in 1 week post discharge.     Bryan Moore will be discharged in stable and improved condition. 2. Acute renal failure.  On admission, Bryan Moore was noted to have     an acute renal insufficiency which was felt to be likely prerenal     azotemia secondary to recurrent symptomatic anemia/acute blood loss     anemia.  Bryan Moore was hydrated with IV fluids.  Bryan Moore     improved clinically in terms of his renal function and his acute     renal failure had resolved by Bryan day of discharge.  Bryan Moore     will be discharged in stable and improved condition.  Bryan rest of     Bryan Moore's chronic medical issues remained stable throughout Bryan      hospitalization.  DISCHARGE VITAL SIGNS:  On Bryan day of discharge, vital signs with temperature 98.6, pulse of 80, blood pressure 154/66, respirations 18, satting 95% on room air.  DISCHARGE LABS:  Sodium 141, potassium 4.0, chloride 109, bicarb 21, BUN 14, creatinine 1.18, glucose  of 81, calcium of 8.6.  CBC with a white count of 3.6, hemoglobin 8.1, platelet count of 136, hematocrit of 24.1.  It has been a pleasure taking care of Bryan Moore.  LABS ON DAY OF DISCHARGE:  Sodium 141, potassium 4.0, chloride 109, bicarb 21, BUN 14, creatinine 1.18, glucose of 81, calcium of 8.6.  CBC with a white count of 3.6, hemoglobin 8.1, hematocrit 24.1 and a platelet count of 136.     [name], MD     DT/MEDQ  D:  04/07/2011  T:  04/07/2011  Job:  [id number]  cc:   Ronald Lobo, M.D. Juanda Bond. [name], M.D.  Electronically Signed by [name] MD on 04/12/2011 12:24:33 PM

## 2011-04-18 NOTE — Consult Note (Signed)
  NAME:  Bryan Moore, LAFAZIA.:  [id number]  MEDICAL RECORD NO.:  [id number]  LOCATION:  [id number]                         FACILITY:  [facility]  PHYSICIAN:  Wonda Horner, M.D.   DATE OF BIRTH:  1920/03/29  DATE OF CONSULTATION:  04/04/2011 DATE OF DISCHARGE:                                CONSULTATION   REASON FOR CONSULTATION:  The patient is an 77 year old male who was admitted to the hospital because of significant weakness.  He has a history of having had a GI bleed recently and on March 07, 2011, he was in this hospital and had an EGD with Dr. Amedeo Plenty who found a single gastric AVM and when attempted argon plasma coagulation therapy was done of this it started to bleed, it was therefore Endoclipped.  It was felt that this was probably the source of his bleeding.  The patient was discharged on March 09, 2011, and at that time he had a hemoglobin and hematocrit of 11 and 32.9 respectively.  He was readmitted to the hospital yesterday and his hemoglobin and hematocrit were 7.2 and 21.9 respectively.  The patient has not vomited any blood.  His stools have been black, but he has been on iron.  He was previously on Plavix, but he has been off Plavix since March 04, 2011.  His stools have been found to be heme positive again on this admission.  We are asked to see him in regards to follow up endoscopic evaluation.  PAST HISTORY:  In addition to above, he has a history of aortic stenosis and is status post a recent aortic valve replacement by transcatheter method at [facility].  Also history of hypertension, degenerative disk disease, glaucoma, hyperlipidemia.  ALLERGIES:  PENICILLIN.  MEDICATIONS PRIOR TO ADMISSION:  Aspirin, iron, Lasix, Protonix.  REVIEW OF SYSTEMS:  Not complaining of any chest pain or shortness of breath.  Has been feeling weak.  PHYSICAL EXAMINATION:  VITAL SIGNS:  Stable. GENERAL:  He is alert and oriented.  He is in no distress. HEENT:   Nonicteric. HEART:  Regular rhythm. LUNGS:  Clear. ABDOMEN:  Soft, nontender.  No hepatosplenomegaly.  IMPRESSION:  Recurrent anemia in an 77 year old male with a history of gastric arteriovenous malformation.  PLAN:  I will schedule him for an EGD tomorrow to further evaluate.  The patient states his last colonoscopy was 3 years ago.  If his EGD is negative at this time, I will then set him up for a colonoscopy.          ______________________________ Wonda Horner, M.D.     SFG/MEDQ  D:  04/04/2011  T:  04/04/2011  Job:  [id number]  cc:   Ronald Lobo, M.D. [facility] by Anson Fret MD on 04/18/2011 08:50:01 AM

## 2011-04-18 NOTE — Op Note (Signed)
  NAME:  Bryan Moore, FENSTERMAKER NO.:  [id number]  MEDICAL RECORD NO.:  [id number]  LOCATION:  3709                         FACILITY:  [facility]  PHYSICIAN:  Wonda Horner, M.D.   DATE OF BIRTH:  12-20-1919  DATE OF PROCEDURE: DATE OF DISCHARGE:                              OPERATIVE REPORT   INDICATION FOR PROCEDURE:  Anemia, heme-positive stool, history of gastric AVM recently scoped by Dr. Amedeo Plenty with EndoClipping of an AVM in the proximal stomach.  The patient has returned to the hospital with worsening anemia.  Informed consent was obtained after explanation of the risks of bleeding, infection and perforation.  PREMEDICATION: 1. Fentanyl 50 mcg IV. 2. Versed 3 mg IV.  PROCEDURE:  With the patient in the left lateral decubitus position, the Pentax gastroscope was inserted into the oropharynx and passed into the esophagus.  It was advanced down the esophagus then into the stomach and into the duodenum.  The second portion and bulb of the duodenum looked normal.  In the stomach in the upper fundal area, I could see an EndoClip, where the prior AVM had been clipped.  There was no evidence of bleeding in this area.  In the greater curvature of the stomach, there were 2 small AVMs.  Both of which were cauterized with the argon plasma coagulator with good results.  The rest of the stomach looked normal.  The esophagus looked normal.  He tolerated the procedure well without complications.  IMPRESSION:  Two small arteriovenous malformations on the greater curvature of the stomach which were not bleeding, but cauterized with the argon plasma coagulator due to the patient's history the previously placed EndoClip was still in place.          ______________________________ Wonda Horner, M.D.     SFG/MEDQ  D:  04/05/2011  T:  04/05/2011  Job:  [id number]  cc:   Ronald Lobo, M.D.  Electronically Signed by Anson Fret MD on 04/18/2011 08:50:05 AM

## 2011-04-18 NOTE — Consult Note (Signed)
  NAME:  Bryan Moore, DONICA.:  [id number]  MEDICAL RECORD NO.:  [id number]  LOCATION:  2610                         FACILITY:  [facility]  PHYSICIAN:  Wonda Horner, M.D.   DATE OF BIRTH:  08/13/1919  DATE OF CONSULTATION:  03/06/2011 DATE OF DISCHARGE:                                CONSULTATION   REASON FOR CONSULTATION:  The patient is an 77 year old white male who is status post transcatheter aortic valve replacement a couple of weeks ago at [facility].  He presents to the emergency room with complaints of melena for several days associated with progressive weakness and with a hemoglobin and hematocrit of 6 and 17.4 today in the emergency room.  An EGD was done on this patient on September 01, 2010, by Dr. Cristina Gong  which showed a few angiodysplastic lesions in the greater curvature of the stomach.  This was done because of a history of chronic anemia.  There was no evidence of significant bleeding and no other lesions.  The patient denies abdominal pain, nausea, vomiting, or hematemesis.  [facility], he has been on Protonix, but also has been on Plavix.  Last colonoscopy noted from records was in 2009, with findings of colon polyps and diverticulosis.  MEDICATIONS:  Alphagan, hydrochlorothiazide, iron, lisinopril, Plavix, Protonix, atenolol, Xalatan.  ALLERGIES:  PENICILLIN.  SOCIAL HISTORY:  Negative for smoking or alcohol.  SYSTEMS REVIEW:  No complaints of chest pain or shortness of breath.  Other surgeries include back surgery.  PHYSICAL EXAMINATION:  GENERAL:  He appears pale.  He is alert and nonicteric. VITAL SIGNS:  Stable. HEART:  Regular rhythm. LUNGS:  Clear. ABDOMEN:  Bowel sounds were normal.  It is soft.  It is nontender.  IMPRESSION:  Gastrointestinal bleeding characterized by melena and associated with anemia with a hemoglobin of 6 and a hematocrit of 17.4.  COMMENT:  I suspect that this patient is a bleeding  from angiodysplasia in the stomach which has been previously seen on EGD back in January of this year.  This has probably been aggravated by the Plavix that he is taking and perhaps stress-induced gastritis postoperatively.  PLAN:  He is being admitted to the hospital by the [facility].  He should be on a proton pump inhibitor.  Dr. Cristina Gong will see the patient tomorrow and reassess whether repeat endoscopy needs to be done or just continue with conservative therapy at this time. I would recommend transfusion of 2 units of packed red cells this evening.          ______________________________ Wonda Horner, M.D.     SFG/MEDQ  D:  03/06/2011  T:  03/07/2011  Job:  [id number]  cc:   Juanda Bond. [name], M.D. Ronald Lobo, M.D.  Electronically Signed by Anson Fret MD on 04/18/2011 08:49:48 AM

## 2011-04-29 NOTE — Op Note (Signed)
  NAME:  Bryan Moore, CARNER NO.:  [id number]  MEDICAL RECORD NO.:  [id number]  LOCATION:  2610                         FACILITY:  [facility]  PHYSICIAN:  Oluwadamilola Deliz C. Amedeo Plenty, M.D.    DATE OF BIRTH:  1920/04/27  DATE OF PROCEDURE:  03/07/2011 DATE OF DISCHARGE:                              OPERATIVE REPORT   Esophagogastroduodenoscopy with laser therapy.  INDICATIONS FOR PROCEDURE:  GI bleeding on Plavix.  PROCEDURE:  The patient was placed in the left lateral decubitus position and placed on the pulse monitor with continuous low-flow oxygen delivered by nasal cannula.  He was sedated with 30 mcg IV fentanyl and 2.5 IV Versed.  The Olympus video endoscope was advanced under direct vision into the oropharynx and esophagus.  The esophagus was straight and of normal caliber with the squamocolumnar line at 38 cm.  There was a small hiatal hernia, but no other abnormality at the GE junction.  The stomach was entered, and a small amount of liquid secretions were suctioned from the fundus.  Retroflexed view of cardia confirmed the hiatal hernia and was otherwise unremarkable.  Within the fundus, there was seen a single prominent AVM, and no other abnormalities.  The duodenum was entered both bulb and second portion were inspected and appeared be within normal limits.  The scope was withdrawn back into the stomach and the argon plasma coagulator used to attempt hemostasis. However, this was unsuccessful and bleeding ensued.  After a period of observation, it was elected to use the EndoClip, which was applied with immediate hemostasis, and no further bleeding ensued.  The scope was then withdrawn, and the patient returned to the recovery room in stable condition.  He tolerated the procedure well, and there were no immediate complications.  IMPRESSION:  Single gastric arteriovenous malformation not bleeding at the time of the procedure, but with significant bleeding after  attempted laser therapy.  Hemostasis achieved with the EndoClip.  PLAN:  Hold Plavix as long as safely allowable for cardiac standpoint, observe for further bleeding by monitoring stools.  Hemoglobin, consider capsule endoscopy or colonoscopy if bleeding persists.          ______________________________ Elyse Jarvis Amedeo Plenty, M.D.     JCH/MEDQ  D:  03/07/2011  T:  03/08/2011  Job:  [id number]  Electronically Signed by Teena Irani M.D. on 04/29/2011 11:38:18 AM

## 2011-05-02 ENCOUNTER — Ambulatory Visit (HOSPITAL_COMMUNITY): Payer: Medicare Other | Attending: Gastroenterology

## 2011-05-02 ENCOUNTER — Other Ambulatory Visit: Payer: Self-pay | Admitting: Gastroenterology

## 2011-05-02 DIAGNOSIS — D62 Acute posthemorrhagic anemia: Secondary | ICD-10-CM | POA: Insufficient documentation

## 2011-05-03 LAB — CROSSMATCH
ABO/RH(D): O POS
Antibody Screen: NEGATIVE
Unit division: 0
Unit division: 0

## 2011-05-15 ENCOUNTER — Other Ambulatory Visit: Payer: Self-pay | Admitting: *Deleted

## 2011-05-15 MED ORDER — FUROSEMIDE 20 MG PO TABS: 20.0000 mg | ORAL_TABLET | Freq: Every day | ORAL | Status: DC

## 2011-05-19 NOTE — H&P (Signed)
NAME:  Bryan Moore, Bryan Moore NO.:  [id number]  MEDICAL RECORD NO.:  [id number]  LOCATION:  [facility]                         FACILITY:  [facility]  PHYSICIAN:  Barbette Merino, M.D.      DATE OF BIRTH:  1920-03-28  DATE OF ADMISSION:  04/03/2011 DATE OF DISCHARGE:                             HISTORY & PHYSICAL   PRIMARY CARE PHYSICIAN:  Juanda Bond. [name], MD  GASTROENTEROLOGIST:  Ronald Lobo, MD  PRESENTING COMPLAINT:  Weakness and anemia.  HISTORY OF PRESENT ILLNESS:  The patient is an 77 year old gentleman who was known to have recurrent GI bleeds was last admitted July 31 to August 3 here where he was found to have a gastric AVM.  His GI bleed was thought to be secondary to NSAID use in the past on last admission due to Plavix.  He has had transcatheter aortic valve replacement.  The patient has been doing fine since discharge and at the time of discharge his hemoglobin was above 9.  He came in today after lab work done by Dr. Cristina Gong which shows hemoglobin of 7.7.  Here in the ED his hemoglobin was down to 7.2.  He is being weak and tired, especially in his lower extremities but denied any shortness of breath.  Denied any leg swelling.  No chest pain.  He denied any hematemesis and no frank bleeding.  His stool guaiac was, however, positive here.  PAST MEDICAL HISTORY:  Significant for GI bleed with anemia of blood loss, history of aortic stenosis status post recent aortic valve replacement which is transcatheter performed at [facility] hypertension, degenerative disk disease status post lumbar surgery, history of glaucoma, hyperlipidemia, history of gastritis from NSAID use.  PAST SURGICAL HISTORY:  Significant for transcatheter aortic valve replacement on February 13, 2011, lumbar spinal surgery in 2011, and appendectomy.  ALLERGIES:  The patient has is allergic to PENICILLIN that causes anaphylaxis.  CURRENT MEDICATIONS:  ASPIRIN, IRON, LASIX, and PROTONIX.  SOCIAL  HISTORY:  The patient lives with his wife here in [place].  No tobacco, alcohol, or IV drug use.  He is a retired Hotel manager.  He used to smoke pipe only on social occasion.  FAMILY HISTORY:  Significant for coronary artery disease in side of his father and 1 of his brothers.  REVIEW OF SYSTEMS:  All system reviewed are negative except per HPI.  PHYSICAL EXAMINATION:  VITAL SIGNS:  His temperature is 97.8, blood pressure 124/44, his pulse 85, respiratory rate 16, sats 98% on room air. GENERAL:  He is awake, alert, oriented, pleasant who is in no acute distress. HEENT:  PERRL.  EOMI.  No significant pallor, no jaundice.  No rhinorrhea. NECK:  Supple.  No visible JVD, no lymphadenopathy. RESPIRATORY:  He has good air entry bilaterally.  No wheezes, no rales, no crackles. CARDIOVASCULAR SYSTEM:  He has S1, S2.  No audible murmur. ABDOMEN:  Soft, full, nontender with positive bowel sounds. EXTREMITIES:  No edema, cyanosis, or clubbing. SKIN:  No rashes, no ulcers. MUSCULOSKELETAL:  No joint swelling, tenderness.  LABORATORY DATA:  His white count is 4.9, hemoglobin 7.2 with platelets of 176,000.  He has normal differentials.  Sodium is 139, potassium  4.3, chloride 104, CO2 26, glucose 87, BUN 30, creatinine 1.57 with a calcium of 8.8.  His PT is 14.1, INR 1.07.  Fecal occult blood test was positive.  ASSESSMENT:  This is an 77 year old gentleman presenting with acute blood loss anemia since the patient has started having problem.  More than likely he has arteriovenous malformation bleed.  His hemoglobin was 9.7 on August 6 and now 7.2.  PLAN: 1. Acute blood loss anemia.  We will transfuse at least 2 units of     packed red blood cells.  Admit the patient, 2 wide bore IVs.  We     will also give him IV Protonix, get GI back for possible repeat EGD     and evaluation.  We will also hydrate the patient and put him on     tele. 2. GI bleed.  Again we will proceed as above.  We will  follow serial     CBCs every 6 hours.  We will also look at his stools for further     evidence of bleed. 3. History of aortic stenosis.  The patient is stable after his     surgery. 4. Hypertension.  Blood pressure is reasonable and we will hold     antihypertensive in the setting of GI bleed.  Other medical problems also are stable and we will continue to observe the patient here.     Barbette Merino, M.D.     Scot Dock  D:  04/03/2011  T:  04/03/2011  Job:  [id number]  Electronically Signed by Barbette Merino M.D. on 05/19/2011 03:02:07 PM

## 2011-05-28 ENCOUNTER — Encounter (HOSPITAL_COMMUNITY)
Admission: RE | Admit: 2011-05-28 | Discharge: 2011-05-28 | Disposition: A | Discharge status: Home / Self Care | Payer: Medicare Other | Source: Ambulatory Visit | Attending: Cardiovascular Disease | Admitting: Cardiovascular Disease

## 2011-05-28 DIAGNOSIS — I10 Essential (primary) hypertension: Secondary | ICD-10-CM | POA: Insufficient documentation

## 2011-05-28 DIAGNOSIS — Z5189 Encounter for other specified aftercare: Secondary | ICD-10-CM | POA: Insufficient documentation

## 2011-05-28 DIAGNOSIS — R0609 Other forms of dyspnea: Secondary | ICD-10-CM | POA: Insufficient documentation

## 2011-05-28 DIAGNOSIS — E785 Hyperlipidemia, unspecified: Secondary | ICD-10-CM | POA: Insufficient documentation

## 2011-05-28 DIAGNOSIS — R0989 Other specified symptoms and signs involving the circulatory and respiratory systems: Secondary | ICD-10-CM | POA: Insufficient documentation

## 2011-05-28 DIAGNOSIS — R079 Chest pain, unspecified: Secondary | ICD-10-CM | POA: Insufficient documentation

## 2011-05-28 DIAGNOSIS — I359 Nonrheumatic aortic valve disorder, unspecified: Secondary | ICD-10-CM | POA: Insufficient documentation

## 2011-05-28 DIAGNOSIS — F0390 Unspecified dementia without behavioral disturbance: Secondary | ICD-10-CM | POA: Insufficient documentation

## 2011-05-30 ENCOUNTER — Encounter (HOSPITAL_COMMUNITY): Payer: Medicare Other

## 2011-06-01 ENCOUNTER — Encounter (HOSPITAL_COMMUNITY): Payer: Medicare Other

## 2011-06-04 ENCOUNTER — Encounter (HOSPITAL_COMMUNITY): Payer: Medicare Other

## 2011-06-06 ENCOUNTER — Encounter (HOSPITAL_COMMUNITY): Payer: Medicare Other

## 2011-06-08 ENCOUNTER — Encounter (HOSPITAL_COMMUNITY): Payer: Medicare Other

## 2011-06-11 ENCOUNTER — Encounter (HOSPITAL_COMMUNITY): Payer: Medicare Other

## 2011-06-11 ENCOUNTER — Telehealth: Payer: Self-pay | Admitting: *Deleted

## 2011-06-11 DIAGNOSIS — R0989 Other specified symptoms and signs involving the circulatory and respiratory systems: Secondary | ICD-10-CM | POA: Insufficient documentation

## 2011-06-11 DIAGNOSIS — E785 Hyperlipidemia, unspecified: Secondary | ICD-10-CM | POA: Insufficient documentation

## 2011-06-11 DIAGNOSIS — Z5189 Encounter for other specified aftercare: Secondary | ICD-10-CM | POA: Insufficient documentation

## 2011-06-11 DIAGNOSIS — I10 Essential (primary) hypertension: Secondary | ICD-10-CM | POA: Insufficient documentation

## 2011-06-11 DIAGNOSIS — R0609 Other forms of dyspnea: Secondary | ICD-10-CM | POA: Insufficient documentation

## 2011-06-11 DIAGNOSIS — F0390 Unspecified dementia without behavioral disturbance: Secondary | ICD-10-CM | POA: Insufficient documentation

## 2011-06-11 DIAGNOSIS — R079 Chest pain, unspecified: Secondary | ICD-10-CM | POA: Insufficient documentation

## 2011-06-11 DIAGNOSIS — I359 Nonrheumatic aortic valve disorder, unspecified: Secondary | ICD-10-CM | POA: Insufficient documentation

## 2011-06-11 NOTE — Telephone Encounter (Signed)
Received an ekg for dr to interpret from cardiac rehab/ carleta careson rn, returned call and informed ekg is normal for pt. Strip sent to be scanned.

## 2011-06-11 NOTE — Progress Notes (Signed)
Reviewed home exercise with pt today.  Pt plans to continue walking and going to [facility] for exercise.  Reviewed THR, pulse, RPE, sign and symptoms, NTG use, and when to call 911 or MD.  Pt voiced understanding.

## 2011-06-13 ENCOUNTER — Encounter (HOSPITAL_COMMUNITY): Payer: Medicare Other

## 2011-06-15 ENCOUNTER — Encounter (HOSPITAL_COMMUNITY): Payer: Medicare Other

## 2011-06-18 ENCOUNTER — Encounter (HOSPITAL_COMMUNITY)
Admission: RE | Admit: 2011-06-18 | Discharge: 2011-06-18 | Payer: Medicare Other | Source: Ambulatory Visit | Attending: Cardiovascular Disease | Admitting: Cardiovascular Disease

## 2011-06-20 ENCOUNTER — Encounter (HOSPITAL_COMMUNITY): Payer: Medicare Other

## 2011-06-21 ENCOUNTER — Encounter: Payer: Self-pay | Admitting: Cardiovascular Disease

## 2011-06-22 ENCOUNTER — Other Ambulatory Visit (HOSPITAL_COMMUNITY): Payer: Self-pay | Admitting: *Deleted

## 2011-06-22 ENCOUNTER — Encounter (HOSPITAL_COMMUNITY): Payer: Medicare Other

## 2011-06-25 ENCOUNTER — Telehealth (HOSPITAL_COMMUNITY): Payer: Self-pay | Admitting: *Deleted

## 2011-06-25 ENCOUNTER — Encounter (HOSPITAL_COMMUNITY): Admission: RE | Admit: 2011-06-25 | Payer: Medicare Other | Source: Ambulatory Visit

## 2011-06-25 ENCOUNTER — Encounter (HOSPITAL_COMMUNITY): Payer: Self-pay | Admitting: *Deleted

## 2011-06-25 NOTE — Telephone Encounter (Signed)
Called and spoke to pt's wife Tula Nakayama.  Pt wife reports he is to have repeat of lab work RBC today.  Pt's wife anticipates he will stay out of rehab this week and hopes to return on Monday - December 2.

## 2011-06-26 ENCOUNTER — Other Ambulatory Visit (HOSPITAL_COMMUNITY): Payer: Self-pay | Admitting: *Deleted

## 2011-06-26 ENCOUNTER — Encounter (HOSPITAL_COMMUNITY): Payer: Self-pay | Admitting: *Deleted

## 2011-06-27 ENCOUNTER — Encounter (HOSPITAL_COMMUNITY): Payer: Medicare Other

## 2011-06-27 ENCOUNTER — Other Ambulatory Visit (HOSPITAL_COMMUNITY): Payer: Self-pay | Admitting: *Deleted

## 2011-06-27 ENCOUNTER — Encounter (HOSPITAL_COMMUNITY): Payer: Self-pay

## 2011-06-27 ENCOUNTER — Encounter (HOSPITAL_COMMUNITY)
Admission: RE | Admit: 2011-06-27 | Discharge: 2011-06-27 | Disposition: A | Discharge status: Home / Self Care | Payer: Medicare Other | Source: Ambulatory Visit | Attending: Gastroenterology | Admitting: Gastroenterology

## 2011-06-27 LAB — ABO/RH: ABO/RH(D): O POS

## 2011-06-27 LAB — PREPARE RBC (CROSSMATCH)

## 2011-06-27 MED ORDER — SODIUM CHLORIDE 0.9 % IV SOLN
Freq: Once | INTRAVENOUS | Status: AC
  Administered 2011-06-27: 250 mL via INTRAVENOUS

## 2011-06-27 MED ORDER — FUROSEMIDE 20 MG PO TABS
20.0000 mg | ORAL_TABLET | Freq: Once | ORAL | Status: AC
  Administered 2011-06-27: 20 mg via ORAL
  Filled 2011-06-27: qty 1

## 2011-06-28 LAB — TYPE AND SCREEN
ABO/RH(D): O POS
Antibody Screen: NEGATIVE
Unit division: 0

## 2011-06-29 ENCOUNTER — Encounter (HOSPITAL_COMMUNITY): Payer: Medicare Other

## 2011-07-02 ENCOUNTER — Encounter (HOSPITAL_COMMUNITY): Payer: Medicare Other

## 2011-07-04 ENCOUNTER — Encounter (HOSPITAL_COMMUNITY): Payer: Medicare Other

## 2011-07-05 ENCOUNTER — Encounter: Payer: Self-pay | Admitting: Cardiovascular Disease

## 2011-07-06 ENCOUNTER — Encounter (HOSPITAL_COMMUNITY): Payer: Medicare Other

## 2011-07-06 ENCOUNTER — Encounter: Payer: Self-pay | Admitting: Cardiovascular Disease

## 2011-07-06 ENCOUNTER — Ambulatory Visit (INDEPENDENT_AMBULATORY_CARE_PROVIDER_SITE_OTHER): Payer: Medicare Other | Admitting: Cardiovascular Disease

## 2011-07-06 DIAGNOSIS — I10 Essential (primary) hypertension: Secondary | ICD-10-CM

## 2011-07-06 DIAGNOSIS — I359 Nonrheumatic aortic valve disorder, unspecified: Secondary | ICD-10-CM

## 2011-07-06 NOTE — Assessment & Plan Note (Signed)
He continues to do fairly well. He status post TAVR. He has some aortic insufficiency following the procedure. He seems to be doing better. He is no longer having as much shortness breath or weakness. He still is a little "off balance".  I think some of this may be related to his age. I do not think that it is related to his aortic valve.

## 2011-07-06 NOTE — Assessment & Plan Note (Signed)
His blood pressure has been well controlled. BP 138/56  Pulse 61  Ht 5\' 8"  (1.727 m)  Wt 161 lb 1.9 oz (73.084 kg)  BMI 24.50 kg/m2

## 2011-07-06 NOTE — Patient Instructions (Signed)
Your physician wants you to follow-up in:  6 months. You will receive a reminder letter in the mail two months in advance. If you don't receive a letter, please call our office to schedule the follow-up appointment.   

## 2011-07-06 NOTE — Progress Notes (Signed)
    Bryan Moore Date of Birth  May 04, 1920 [facility] [id number]    [place], [place]  [address] [phone]  Fax  [phone]  History of Present Illness:  Bryan Moore is an 77 y.o. gentleman with a hx of CAD , s/p CABG and hx of AS - s/p TAVR.  Unfortunately he's had lots of GI bleeds do to AV malformations. He is now off aspirin and Plavix. He continues to have some problems with anemia. He's getting a blood transfusion about once a month.  Current Outpatient Prescriptions on File Prior to Visit  Medication Sig Dispense Refill  . B Complex Vitamins (B-COMPLEX/B-12 PO) Take by mouth daily.        . furosemide (LASIX) 20 MG tablet Take 1 tablet (20 mg total) by mouth daily.  30 tablet  6  . IRON PO Take by mouth daily.          Allergies  Allergen Reactions  . Penicillins     Reaction not listed  Review c patient    Past Medical History  Diagnosis Date  . Anemia   . Hypertension   . Hyperlipemia   . Aortic stenosis   . Dementia   . Glaucoma   . GERD (gastroesophageal reflux disease)   . Mitral regurgitation     Past Surgical History  Procedure Date  . Appendectomy   . Back surgery   . Cardiac valve replacement     02/2011  . Cardiac catheterization 10/23/2010    ef 65%  . US echocardiography 08/31/10    EF 55-60%  . Cardiovascular stress test 01/01/2007    EF 64%, NO EVIDENCE OF ISCHEMIA    History  Smoking status  . Former Smoker  . Quit date: 07/04/1981  Smokeless tobacco  . Not on file    History  Alcohol Use     No family history on file.  Reviw of Systems:  Reviewed in the HPI.  All other systems are negative.  Physical Exam: BP 138/56  Pulse 61  Ht 5\' 8"  (1.727 m)  Wt 161 lb 1.9 oz (73.084 kg)  BMI 24.50 kg/m2 The patient is alert and oriented x 3.  The mood and affect are normal.   Skin: warm and dry.  Color is normal.    HEENT:   [place]/AT, no JVD, normal carotids  Lungs: He had a few rales in the bases. These rales  cleared with several deep breaths.  Heart: RR.  There is 2/6 diastolic murmur at the LSB.     Abdomen: +BS, no HSM  Extremities:  No c/c/e  Neuro:  Non focal    ECG:  Assessment / Plan:

## 2011-07-09 ENCOUNTER — Encounter (HOSPITAL_COMMUNITY): Admission: RE | Admit: 2011-07-09 | Payer: Medicare Other | Source: Ambulatory Visit

## 2011-07-11 ENCOUNTER — Encounter (HOSPITAL_COMMUNITY): Payer: Medicare Other

## 2011-07-11 ENCOUNTER — Telehealth (HOSPITAL_COMMUNITY): Payer: Self-pay | Admitting: *Deleted

## 2011-07-11 NOTE — Telephone Encounter (Signed)
Called pt regarding returning to exercise  Pt did not wish to return due to medical issues.  Will send progress note to his MD.

## 2011-07-13 ENCOUNTER — Encounter (HOSPITAL_COMMUNITY): Payer: Medicare Other

## 2011-07-16 ENCOUNTER — Encounter (HOSPITAL_COMMUNITY): Payer: Medicare Other

## 2011-07-18 ENCOUNTER — Encounter (HOSPITAL_COMMUNITY): Payer: Medicare Other

## 2011-07-18 NOTE — Progress Notes (Addendum)
Cardiac Rehabilitation Program Progress Report   Orientation:  05/24/2011 Graduate Date:   Discharge Date:  07/12/2011 # of sessions completed: 6/36  Cardiologist: Acie Fredrickson Family MD:  Altholmer  Class Time:  [id number]  A.  Exercise Program:  Tolerates exercise @ 2.7 METS for 30 minutes, Bike Test Results:  Pre: 0.85 miles, Discharged to home exercise program.  Anticipated compliance:  poor and Discharged  B.  Mental Health:  Health related anxiety  C.  Education/Instruction/Skills  Attended 3/10 education classes  Home exercise given: 06/11/11  D.  Nutrition/Weight Control/Body Composition:  Adherence to prescribed nutrition program: fair   *This section completed by Derek Mound, Reed Pandy, RD, LDN, CDE  E.  Blood Lipids    Lab Results  Component Value Date   CHOL 175 03/27/2011     Lab Results  Component Value Date   TRIG 74.0 03/27/2011     Lab Results  Component Value Date   HDL 41.80 03/27/2011     Lab Results  Component Value Date   CHOLHDL 4 03/27/2011     No results found for this basename: LDLDIRECT      F.  Lifestyle Changes:  Needs physician encouragement on making lifestyle changes  G.  Symptoms noted with exercise:  Asymptomatic  Report Completed By:  Clotilde Dieter   Comments:  Pt dropped from program for personal reasons.  Pt plans to continue to exercise by walking.       Pt completed 6 exercise sessions from 05/28/11 to 07/12/11.  Pt would benefit from encouragement to continue with his home exercise.  Maurice Small RN

## 2011-07-20 ENCOUNTER — Encounter (HOSPITAL_COMMUNITY): Payer: Medicare Other

## 2011-07-23 ENCOUNTER — Encounter (HOSPITAL_COMMUNITY): Payer: Medicare Other

## 2011-07-25 ENCOUNTER — Encounter (HOSPITAL_COMMUNITY): Payer: Medicare Other

## 2011-07-27 ENCOUNTER — Encounter (HOSPITAL_COMMUNITY): Payer: Medicare Other

## 2011-07-30 ENCOUNTER — Encounter (HOSPITAL_COMMUNITY): Payer: Medicare Other

## 2011-08-01 ENCOUNTER — Encounter (HOSPITAL_COMMUNITY): Payer: Medicare Other

## 2011-08-03 ENCOUNTER — Encounter (HOSPITAL_COMMUNITY): Payer: Medicare Other

## 2011-08-06 ENCOUNTER — Telehealth: Payer: Self-pay | Admitting: Cardiovascular Disease

## 2011-08-06 ENCOUNTER — Encounter (HOSPITAL_COMMUNITY): Payer: Medicare Other

## 2011-08-06 NOTE — Telephone Encounter (Signed)
I spoke with the pt's wife and she said that Christmas morning the pt had an episode of fatigue, sweating on forehead and was disoriented.  This episode lasted about an hour.  The pt did not have CP with episode.  Since this episode the pt has been fine without any symptoms. She did give the pt 2 baby ASA during this episode.  At this time the pt is scheduled to follow-up at [facility] on 08/16/11.  I asked her if the pt had eaten on Christmas morning and she could not remember.  She did give the pt something to eat during the episode and he started to feel better.  I made her aware that the pt's symptoms could have been related to a low blood sugar.  She also states that the pt had some anxiety because of the holiday and having 10 people staying in the home.  I instructed her to continue to monitor the pt and if he developed any symptoms to notify our office or go to the ER.  She agreed with plan.

## 2011-08-06 NOTE — Telephone Encounter (Signed)
New Problem:     Patient's wife called because the patient had a small attack, she believes, at 9:00am on Christmas day.  He was very tired, minimally perspiring on his forehead, and disoriented.  She gave him two baby aspirin and he felt better. She was wondering if he should come in and have an EKG to see if he had a stroke or a mini heart attack.

## 2011-08-08 ENCOUNTER — Encounter (HOSPITAL_COMMUNITY): Payer: Medicare Other

## 2011-08-10 ENCOUNTER — Encounter (HOSPITAL_COMMUNITY): Payer: Medicare Other

## 2011-08-13 ENCOUNTER — Encounter (HOSPITAL_COMMUNITY): Payer: Medicare Other

## 2011-08-15 ENCOUNTER — Encounter (HOSPITAL_COMMUNITY): Payer: Medicare Other

## 2011-08-15 DIAGNOSIS — D649 Anemia, unspecified: Secondary | ICD-10-CM | POA: Diagnosis not present

## 2011-08-15 DIAGNOSIS — I350 Nonrheumatic aortic (valve) stenosis: Secondary | ICD-10-CM | POA: Insufficient documentation

## 2011-08-16 DIAGNOSIS — I359 Nonrheumatic aortic valve disorder, unspecified: Secondary | ICD-10-CM | POA: Diagnosis not present

## 2011-08-16 DIAGNOSIS — K922 Gastrointestinal hemorrhage, unspecified: Secondary | ICD-10-CM | POA: Diagnosis not present

## 2011-08-16 DIAGNOSIS — I10 Essential (primary) hypertension: Secondary | ICD-10-CM | POA: Diagnosis not present

## 2011-08-16 DIAGNOSIS — D649 Anemia, unspecified: Secondary | ICD-10-CM | POA: Diagnosis not present

## 2011-08-17 ENCOUNTER — Encounter (HOSPITAL_COMMUNITY): Payer: Medicare Other

## 2011-08-20 ENCOUNTER — Encounter (HOSPITAL_COMMUNITY): Payer: Medicare Other

## 2011-08-22 ENCOUNTER — Encounter (HOSPITAL_COMMUNITY): Payer: Medicare Other

## 2011-08-24 ENCOUNTER — Encounter (HOSPITAL_COMMUNITY): Payer: Medicare Other

## 2011-08-27 ENCOUNTER — Encounter (HOSPITAL_COMMUNITY): Payer: Medicare Other

## 2011-08-29 ENCOUNTER — Encounter (HOSPITAL_COMMUNITY): Payer: Medicare Other

## 2011-08-29 DIAGNOSIS — N500 Atrophy of testis: Secondary | ICD-10-CM | POA: Diagnosis not present

## 2011-08-29 DIAGNOSIS — R350 Frequency of micturition: Secondary | ICD-10-CM | POA: Diagnosis not present

## 2011-08-29 DIAGNOSIS — N400 Enlarged prostate without lower urinary tract symptoms: Secondary | ICD-10-CM | POA: Diagnosis not present

## 2011-08-29 DIAGNOSIS — N402 Nodular prostate without lower urinary tract symptoms: Secondary | ICD-10-CM | POA: Diagnosis not present

## 2011-08-31 ENCOUNTER — Encounter (HOSPITAL_COMMUNITY): Payer: Medicare Other

## 2011-09-11 DIAGNOSIS — D500 Iron deficiency anemia secondary to blood loss (chronic): Secondary | ICD-10-CM | POA: Diagnosis not present

## 2011-09-11 DIAGNOSIS — D649 Anemia, unspecified: Secondary | ICD-10-CM | POA: Diagnosis not present

## 2011-10-01 DIAGNOSIS — D500 Iron deficiency anemia secondary to blood loss (chronic): Secondary | ICD-10-CM | POA: Diagnosis not present

## 2011-10-01 DIAGNOSIS — D649 Anemia, unspecified: Secondary | ICD-10-CM | POA: Diagnosis not present

## 2011-10-03 DIAGNOSIS — D500 Iron deficiency anemia secondary to blood loss (chronic): Secondary | ICD-10-CM | POA: Diagnosis not present

## 2011-10-08 DIAGNOSIS — I10 Essential (primary) hypertension: Secondary | ICD-10-CM | POA: Diagnosis not present

## 2011-10-10 DIAGNOSIS — E7800 Pure hypercholesterolemia, unspecified: Secondary | ICD-10-CM | POA: Diagnosis not present

## 2011-10-10 DIAGNOSIS — J42 Unspecified chronic bronchitis: Secondary | ICD-10-CM | POA: Diagnosis not present

## 2011-10-10 DIAGNOSIS — R413 Other amnesia: Secondary | ICD-10-CM | POA: Diagnosis not present

## 2011-10-10 DIAGNOSIS — I10 Essential (primary) hypertension: Secondary | ICD-10-CM | POA: Diagnosis not present

## 2011-10-10 DIAGNOSIS — Z954 Presence of other heart-valve replacement: Secondary | ICD-10-CM | POA: Diagnosis not present

## 2011-10-10 DIAGNOSIS — D500 Iron deficiency anemia secondary to blood loss (chronic): Secondary | ICD-10-CM | POA: Diagnosis not present

## 2011-10-22 DIAGNOSIS — H4011X0 Primary open-angle glaucoma, stage unspecified: Secondary | ICD-10-CM | POA: Diagnosis not present

## 2011-10-22 DIAGNOSIS — H409 Unspecified glaucoma: Secondary | ICD-10-CM | POA: Diagnosis not present

## 2011-10-22 DIAGNOSIS — Z961 Presence of intraocular lens: Secondary | ICD-10-CM | POA: Diagnosis not present

## 2011-10-30 DIAGNOSIS — D500 Iron deficiency anemia secondary to blood loss (chronic): Secondary | ICD-10-CM | POA: Diagnosis not present

## 2011-11-27 DIAGNOSIS — D649 Anemia, unspecified: Secondary | ICD-10-CM | POA: Diagnosis not present

## 2011-12-05 DIAGNOSIS — B029 Zoster without complications: Secondary | ICD-10-CM

## 2011-12-05 HISTORY — DX: Zoster without complications: B02.9

## 2011-12-10 ENCOUNTER — Ambulatory Visit (INDEPENDENT_AMBULATORY_CARE_PROVIDER_SITE_OTHER): Payer: Medicare Other | Admitting: Nurse Practitioner

## 2011-12-10 ENCOUNTER — Encounter: Payer: Self-pay | Admitting: Nurse Practitioner

## 2011-12-10 ENCOUNTER — Telehealth: Payer: Self-pay | Admitting: Cardiovascular Disease

## 2011-12-10 VITALS — BP 170/80 | HR 54 | Ht 68.0 in | Wt 162.0 lb

## 2011-12-10 DIAGNOSIS — R0789 Other chest pain: Secondary | ICD-10-CM

## 2011-12-10 DIAGNOSIS — I359 Nonrheumatic aortic valve disorder, unspecified: Secondary | ICD-10-CM

## 2011-12-10 DIAGNOSIS — I10 Essential (primary) hypertension: Secondary | ICD-10-CM

## 2011-12-10 MED ORDER — LISINOPRIL 10 MG PO TABS: 10.0000 mg | ORAL_TABLET | Freq: Every day | ORAL | Status: DC

## 2011-12-10 NOTE — Telephone Encounter (Signed)
APP MADE TODAY

## 2011-12-10 NOTE — Patient Instructions (Signed)
We are going to restart your Lisinopril at 10 mg each day.  Monitor your blood pressure at home.  Try to minimize your salt  I will see you in a week  Call the [place] office at [phone] if you have any questions, problems or concerns.

## 2011-12-10 NOTE — Assessment & Plan Note (Signed)
He presents with palpable chest wall pain. This is not felt to be cardiac. Tylenol is recommended. He cannot use aspirin or NSAIDS due to prior bleeding.

## 2011-12-10 NOTE — Assessment & Plan Note (Addendum)
His blood pressure is markedly elevated today. We will check a CBC and BMET today. I have restarted his Lisinopril at 10 mg daily. He is asked to cut back his sodium intake. I have asked his wife to monitor some blood pressures at home. I will see him back in a week.

## 2011-12-10 NOTE — Telephone Encounter (Signed)
New msg Pt's wife called and said that he is having pain in his right shoulder and arm. She wanted to talk to you about this.

## 2011-12-10 NOTE — Assessment & Plan Note (Signed)
He is s/p TAVI. He has a murmur on exam. Will consider echo once blood pressure has improved.

## 2011-12-10 NOTE — Progress Notes (Signed)
Bryan Moore Date of Birth: 09/26/1919 Medical Record [id number]  History of Present Illness: Bryan Moore is seen today for a work in visit. He is seen for Dr. Acie Fredrickson. He is an 78 year old male with prior AS and underwent TAVI down at [facility] last summer. He has no known apparent CAD. He has had lots of GI bleeding from AVM's and requires transfusions.  Not taking aspirin or NSAIDS. His other issues include HTN, HLD, GERD and dementia. His last follow up at [facility] was back in December.   He comes in today. He is here with his wife. He is complaining of sharp pain in the right upper chest that goes down his right arm. It hurts worse if he lies on that side. He has to turn over and then he feels better. He has been doing some new exercises for his balance. He has no other associated symptoms. His wife is quite concerned that this may be cardiac related. They have not been checking his blood pressure. They do eat out and probably have some excessive salt intake. He has been on blood pressure medicines in the past but currently not on any blood pressure agents.   Current Outpatient Prescriptions on File Prior to Visit  Medication Sig Dispense Refill  . B Complex Vitamins (B-COMPLEX/B-12 PO) Take by mouth daily.        . Cholecalciferol (VITAMIN D PO) Take by mouth daily.        . Cyanocobalamin (VITAMIN B-12 PO) Take by mouth daily.        . dorzolamide-timolol (COSOPT) 22.3-6.8 MG/ML ophthalmic solution       . IRON PO Take by mouth daily.        Marland Kitchen latanoprost (XALATAN) 0.005 % ophthalmic solution       . Multiple Vitamin (MULTIVITAMIN PO) Take by mouth daily.        Marland Kitchen lisinopril (PRINIVIL,ZESTRIL) 10 MG tablet Take 1 tablet (10 mg total) by mouth daily.  30 tablet  11    Allergies  Allergen Reactions  . Penicillins     Reaction not listed  Review c patient    Past Medical History  Diagnosis Date  . Anemia     from AVM's. Requiring transfusion  . Hypertension   . Hyperlipemia   .  Aortic stenosis     s/p TAVI July 2012  . Dementia   . Glaucoma   . GERD (gastroesophageal reflux disease)   . Mitral regurgitation     Past Surgical History  Procedure Date  . Appendectomy   . Back surgery   . Cardiac valve replacement     02/2011  . Cardiac catheterization 10/23/2010    ef 65%  . US echocardiography 08/31/10    EF 55-60%  . Cardiovascular stress test 01/01/2007    EF 64%, NO EVIDENCE OF ISCHEMIA    History  Smoking status  . Former Smoker  . Quit date: 07/04/1981  Smokeless tobacco  . Not on file    History  Alcohol Use  . 0.6 oz/week  . 1 Glasses of wine per week    History reviewed. No pertinent family history.  Review of Systems: The review of systems is per the HPI.  All other systems were reviewed and are negative.  Physical Exam: BP 170/80  Pulse 54  Ht 5\' 8"  (1.727 m)  Wt 162 lb (73.483 kg)  BMI 24.63 kg/m2 Initial blood pressure was over [id number] systolic.  Patient is pleasant and  in no acute distress. He seems a little demented. Skin is warm and dry. Color is normal.  HEENT is unremarkable. Normocephalic/atraumatic. PERRL. Sclera are nonicteric. Neck is supple. No masses. No JVD. Lungs are clear. Cardiac exam shows a regular rate and rhythm. He has a blowing diastolic murmur heard at the base. He has palpable chest wall pain over the right chest and shoulder. Abdomen is soft. Extremities are without edema. Gait and ROM are intact. No gross neurologic deficits noted.  LABORATORY DATA: His EKG today shows sinus rhythm, L BBB. Tracing is reviewed with Dr. Acie Fredrickson and is unchanged.   Lab Results  Component Value Date   WBC 3.6* 04/07/2011   HGB 8.1* 04/07/2011   HCT 24.1* 04/07/2011   PLT 136* 04/07/2011   GLUCOSE 81 04/07/2011   CHOL 175 03/27/2011   TRIG 74.0 03/27/2011   HDL 41.80 03/27/2011   LDLCALC 118* 03/27/2011   ALT 8 04/04/2011   AST 17 04/04/2011   NA 141 04/07/2011   K 4.0 04/07/2011   CL 109 04/07/2011   CREATININE 1.18 04/07/2011   BUN 14  04/07/2011   CO2 21 04/07/2011   TSH 2.116 03/08/2011   INR 1.07 04/03/2011    Assessment / Plan:

## 2011-12-11 DIAGNOSIS — R413 Other amnesia: Secondary | ICD-10-CM | POA: Diagnosis not present

## 2011-12-11 DIAGNOSIS — J42 Unspecified chronic bronchitis: Secondary | ICD-10-CM | POA: Diagnosis not present

## 2011-12-11 DIAGNOSIS — D500 Iron deficiency anemia secondary to blood loss (chronic): Secondary | ICD-10-CM | POA: Diagnosis not present

## 2011-12-11 DIAGNOSIS — Z954 Presence of other heart-valve replacement: Secondary | ICD-10-CM | POA: Diagnosis not present

## 2011-12-11 DIAGNOSIS — E7800 Pure hypercholesterolemia, unspecified: Secondary | ICD-10-CM | POA: Diagnosis not present

## 2011-12-11 DIAGNOSIS — R071 Chest pain on breathing: Secondary | ICD-10-CM | POA: Diagnosis not present

## 2011-12-11 DIAGNOSIS — I10 Essential (primary) hypertension: Secondary | ICD-10-CM | POA: Diagnosis not present

## 2011-12-11 LAB — CBC WITH DIFFERENTIAL/PLATELET
Basophils Absolute: 0 10*3/uL (ref 0.0–0.1)
Basophils Relative: 0.2 % (ref 0.0–3.0)
Eosinophils Absolute: 0.1 10*3/uL (ref 0.0–0.7)
Eosinophils Relative: 2 % (ref 0.0–5.0)
HCT: 35.2 % — ABNORMAL LOW (ref 39.0–52.0)
Hemoglobin: 11.9 g/dL — ABNORMAL LOW (ref 13.0–17.0)
Lymphocytes Relative: 14 % (ref 12.0–46.0)
Lymphs Abs: 0.6 10*3/uL — ABNORMAL LOW (ref 0.7–4.0)
MCHC: 33.8 g/dL (ref 30.0–36.0)
MCV: 99.4 fl (ref 78.0–100.0)
Monocytes Absolute: 0.4 10*3/uL (ref 0.1–1.0)
Monocytes Relative: 9.1 % (ref 3.0–12.0)
Neutro Abs: 3.4 10*3/uL (ref 1.4–7.7)
Neutrophils Relative %: 74.7 % (ref 43.0–77.0)
Platelets: 149 10*3/uL — ABNORMAL LOW (ref 150.0–400.0)
RBC: 3.54 Mil/uL — ABNORMAL LOW (ref 4.22–5.81)
RDW: 14.3 % (ref 11.5–14.6)
WBC: 4.6 10*3/uL (ref 4.5–10.5)

## 2011-12-11 LAB — BASIC METABOLIC PANEL
BUN: 21 mg/dL (ref 6–23)
CO2: 25 mEq/L (ref 19–32)
Calcium: 9.5 mg/dL (ref 8.4–10.5)
Chloride: 102 mEq/L (ref 96–112)
Creatinine, Ser: 1.4 mg/dL (ref 0.4–1.5)
GFR: 51.92 mL/min — ABNORMAL LOW (ref >60.00)
Glucose, Bld: 83 mg/dL (ref 70–99)
Potassium: 4.8 mEq/L (ref 3.5–5.1)
Sodium: 138 mEq/L (ref 135–145)

## 2011-12-12 DIAGNOSIS — D500 Iron deficiency anemia secondary to blood loss (chronic): Secondary | ICD-10-CM | POA: Diagnosis not present

## 2011-12-12 DIAGNOSIS — R195 Other fecal abnormalities: Secondary | ICD-10-CM | POA: Diagnosis not present

## 2011-12-12 DIAGNOSIS — K31819 Angiodysplasia of stomach and duodenum without bleeding: Secondary | ICD-10-CM | POA: Diagnosis not present

## 2011-12-13 DIAGNOSIS — B029 Zoster without complications: Secondary | ICD-10-CM | POA: Diagnosis not present

## 2011-12-13 DIAGNOSIS — Z954 Presence of other heart-valve replacement: Secondary | ICD-10-CM | POA: Diagnosis not present

## 2011-12-13 DIAGNOSIS — R21 Rash and other nonspecific skin eruption: Secondary | ICD-10-CM | POA: Diagnosis not present

## 2011-12-13 DIAGNOSIS — E7800 Pure hypercholesterolemia, unspecified: Secondary | ICD-10-CM | POA: Diagnosis not present

## 2011-12-13 DIAGNOSIS — I10 Essential (primary) hypertension: Secondary | ICD-10-CM | POA: Diagnosis not present

## 2011-12-13 DIAGNOSIS — R071 Chest pain on breathing: Secondary | ICD-10-CM | POA: Diagnosis not present

## 2011-12-13 DIAGNOSIS — J42 Unspecified chronic bronchitis: Secondary | ICD-10-CM | POA: Diagnosis not present

## 2011-12-13 DIAGNOSIS — D500 Iron deficiency anemia secondary to blood loss (chronic): Secondary | ICD-10-CM | POA: Diagnosis not present

## 2011-12-17 ENCOUNTER — Ambulatory Visit (INDEPENDENT_AMBULATORY_CARE_PROVIDER_SITE_OTHER): Payer: Medicare Other | Admitting: Nurse Practitioner

## 2011-12-17 ENCOUNTER — Encounter: Payer: Self-pay | Admitting: Nurse Practitioner

## 2011-12-17 VITALS — BP 148/70 | HR 78 | Ht 68.0 in | Wt 158.0 lb

## 2011-12-17 DIAGNOSIS — R0789 Other chest pain: Secondary | ICD-10-CM | POA: Diagnosis not present

## 2011-12-17 DIAGNOSIS — I10 Essential (primary) hypertension: Secondary | ICD-10-CM

## 2011-12-17 DIAGNOSIS — I359 Nonrheumatic aortic valve disorder, unspecified: Secondary | ICD-10-CM

## 2011-12-17 DIAGNOSIS — W19XXXA Unspecified fall, initial encounter: Secondary | ICD-10-CM

## 2011-12-17 LAB — BASIC METABOLIC PANEL
BUN: 23 mg/dL (ref 6–23)
CO2: 25 mEq/L (ref 19–32)
Calcium: 8.9 mg/dL (ref 8.4–10.5)
Chloride: 97 mEq/L (ref 96–112)
Creatinine, Ser: 1.3 mg/dL (ref 0.4–1.5)
GFR: 54.67 mL/min — ABNORMAL LOW (ref >60.00)
Glucose, Bld: 88 mg/dL (ref 70–99)
Potassium: 4.6 mEq/L (ref 3.5–5.1)
Sodium: 136 mEq/L (ref 135–145)

## 2011-12-17 NOTE — Assessment & Plan Note (Signed)
Blood pressure has improved. His murmur is not as loud. We will check a BMET today. His cuff correlates. They will continue to monitor at home.

## 2011-12-17 NOTE — Progress Notes (Signed)
[name] Date of Birth: [birth year] Medical Record [id number]  History of Present Illness: Mr. Joens is seen today for a one week check. He is seen for Dr. Acie Fredrickson. He is 78 years old. He has had TAVI at [facility] last summer and has no apparent CAD. He has had lots of issues with GI bleeding from AVM's and requires frequent transfusions. His other problems include HTN, HLD, GERD and dementia. I saw him a week ago with atypical chest pain. Blood pressure was grossly elevated. He had been off of his medicines for reasons that were not clear.   He comes in today. He is here with his wife. He broke out in blisters since he was last here and has shingles. Was given some type of medicine but had a reaction according to the wife after just 2 pills. He was given another agent but did not start it until today. His wife did not bring the bottles and does not remember the names. Blood pressure has come down. His cuff correlates.   Current Outpatient Prescriptions on File Prior to Visit  Medication Sig Dispense Refill  . B Complex Vitamins (B-COMPLEX/B-12 PO) Take by mouth daily.        . Cholecalciferol (VITAMIN D PO) Take by mouth daily.        . Cyanocobalamin (VITAMIN B-12 PO) Take by mouth daily.        . dorzolamide-timolol (COSOPT) 22.3-6.8 MG/ML ophthalmic solution       . IRON PO Take by mouth daily.        Marland Kitchen latanoprost (XALATAN) 0.005 % ophthalmic solution       . lisinopril (PRINIVIL,ZESTRIL) 10 MG tablet Take 1 tablet (10 mg total) by mouth daily.  30 tablet  11  . Multiple Vitamin (MULTIVITAMIN PO) Take by mouth daily.          Allergies  Allergen Reactions  . Penicillins     Reaction not listed  Review c patient    Past Medical History  Diagnosis Date  . Anemia     from AVM's. Requiring transfusion  . Hypertension   . Hyperlipemia   . Aortic stenosis     s/p TAVI July 2012  . Dementia   . Glaucoma   . GERD (gastroesophageal reflux disease)   . Mitral regurgitation   .  Shingles May 2013    Past Surgical History  Procedure Date  . Appendectomy   . Back surgery   . Cardiac valve replacement     02/2011  . Cardiac catheterization 10/23/2010    ef 65%  . US echocardiography 08/31/10    EF 55-60%  . Cardiovascular stress test 01/01/2007    EF 64%, NO EVIDENCE OF ISCHEMIA    History  Smoking status  . Former Smoker  . Quit date: 07/04/1981  Smokeless tobacco  . Not on file    History  Alcohol Use  . 0.6 oz/week  . 1 Glasses of wine per week    History reviewed. No pertinent family history.  Review of Systems: The review of systems is positive for shingles of the right arm and chest.  All other systems were reviewed and are negative.  Physical Exam: BP 148/70  Pulse 78  Ht 5\' 8"  (1.727 m)  Wt 158 lb (71.668 kg)  BMI 24.02 kg/m2 Patient is very pleasant and in no acute distress. Little demented. Skin is warm and dry. Color is normal.  HEENT is unremarkable. Normocephalic/atraumatic. PERRL. Sclera are nonicteric.  Neck is supple. No masses. No JVD. Lungs are clear. Cardiac exam shows a regular rate and rhythm. His murmur is much softer today.  Abdomen is soft. Extremities are without edema. Gait and ROM are intact. No gross neurologic deficits noted.  LABORATORY DATA: BMET is pending.   Assessment / Plan:

## 2011-12-17 NOTE — Assessment & Plan Note (Signed)
Patient did experience a fall in the lab today after getting his blood drawn. No injury noted. He did not wish for any further evaluation.

## 2011-12-17 NOTE — Assessment & Plan Note (Signed)
He is s/p TAVI. Murmur is much softer today. He is seeing Dr. Acie Fredrickson next month. I will defer repeat echo to Dr. Elmarie Shiley discretion.

## 2011-12-17 NOTE — Patient Instructions (Signed)
Stay on your current medicines.  We are going to recheck your potassium level and kidney function today  Monitor your blood pressure at home  Call the [facility] office at [phone] if you have any questions, problems or concerns.

## 2011-12-17 NOTE — Assessment & Plan Note (Signed)
This is felt to be due to shingles. He has restarted anti viral medicines just today. He is improved clinically with no further reports of pain.

## 2011-12-18 ENCOUNTER — Telehealth: Payer: Self-pay | Admitting: Nurse Practitioner

## 2011-12-18 NOTE — Telephone Encounter (Signed)
Call completed

## 2011-12-18 NOTE — Telephone Encounter (Signed)
New Problem:     Patient's wife returned your phone call. Please call back.

## 2011-12-24 DIAGNOSIS — M47817 Spondylosis without myelopathy or radiculopathy, lumbosacral region: Secondary | ICD-10-CM | POA: Diagnosis not present

## 2012-01-02 DIAGNOSIS — Z954 Presence of other heart-valve replacement: Secondary | ICD-10-CM | POA: Diagnosis not present

## 2012-01-02 DIAGNOSIS — J42 Unspecified chronic bronchitis: Secondary | ICD-10-CM | POA: Diagnosis not present

## 2012-01-02 DIAGNOSIS — I10 Essential (primary) hypertension: Secondary | ICD-10-CM | POA: Diagnosis not present

## 2012-01-02 DIAGNOSIS — R413 Other amnesia: Secondary | ICD-10-CM | POA: Diagnosis not present

## 2012-01-02 DIAGNOSIS — B029 Zoster without complications: Secondary | ICD-10-CM | POA: Diagnosis not present

## 2012-01-02 DIAGNOSIS — D500 Iron deficiency anemia secondary to blood loss (chronic): Secondary | ICD-10-CM | POA: Diagnosis not present

## 2012-01-02 DIAGNOSIS — E7800 Pure hypercholesterolemia, unspecified: Secondary | ICD-10-CM | POA: Diagnosis not present

## 2012-01-11 ENCOUNTER — Encounter: Payer: Self-pay | Admitting: Cardiovascular Disease

## 2012-01-11 ENCOUNTER — Ambulatory Visit (INDEPENDENT_AMBULATORY_CARE_PROVIDER_SITE_OTHER): Payer: Medicare Other | Admitting: Cardiovascular Disease

## 2012-01-11 VITALS — BP 130/68 | HR 74 | Resp 18 | Ht 65.0 in | Wt 154.4 lb

## 2012-01-11 DIAGNOSIS — I359 Nonrheumatic aortic valve disorder, unspecified: Secondary | ICD-10-CM

## 2012-01-11 NOTE — Assessment & Plan Note (Signed)
He continues to do fairly well. He status post TAVR. He has some aortic insufficiency following the procedure. He seems to be doing better. He is no longer having as much shortness breath or weakness. He still is a little "off balance". He's walking with the assistance of a cane. I think some of this may be related to his age. I do not think that it is related to his aortic valve.  I seen again in 6 months. I've asked him to get his echocardiogram results. He has been periodically at [facility]

## 2012-01-11 NOTE — Patient Instructions (Signed)
Your physician wants you to follow-up in: 6 months  You will receive a reminder letter in the mail two months in advance. If you don't receive a letter, please call our office to schedule the follow-up appointment.  Your physician recommends that you return for a FASTING lipid profile: 6 months

## 2012-01-11 NOTE — Progress Notes (Signed)
Bryan Moore Date of Birth  [birth year]       [facility]    [employer] [address], [address] [place], [place], [place]  [address]   [place], [place]  [address] [phone]     [phone]   Fax  [phone]    Fax [phone]  Problem List: 1. Aortic stenosis-status post TAV are active, soma, 2012 2. AV formations with recurrent GI bleeds 3. hypertension 4. Hyperlipidemia. 5. Shingles  History of Present Illness:  Pt has been doing well from a cardiac standpoint.  He has no cardiac complaints.  He had shingles 4-5 weeks ago .  He saw Cecille Rubin and is feeling better.  Current Outpatient Prescriptions on File Prior to Visit  Medication Sig Dispense Refill  . B Complex Vitamins (B-COMPLEX/B-12 PO) Take by mouth daily.        . Cholecalciferol (VITAMIN D PO) Take by mouth daily.        . Cyanocobalamin (VITAMIN B-12 PO) Take by mouth daily.        . dorzolamide-timolol (COSOPT) 22.3-6.8 MG/ML ophthalmic solution       . IRON PO Take by mouth daily.        Marland Kitchen latanoprost (XALATAN) 0.005 % ophthalmic solution       . lisinopril (PRINIVIL,ZESTRIL) 10 MG tablet Take 1 tablet (10 mg total) by mouth daily.  30 tablet  11  . Multiple Vitamin (MULTIVITAMIN PO) Take by mouth daily.          Allergies  Allergen Reactions  . Penicillins     Reaction not listed  Review c patient    Past Medical History  Diagnosis Date  . Anemia     from AVM's. Requiring transfusion  . Hypertension   . Hyperlipemia   . Aortic stenosis     s/p TAVI July 2012  . Dementia   . Glaucoma   . GERD (gastroesophageal reflux disease)   . Mitral regurgitation   . Shingles May 2013    Past Surgical History  Procedure Date  . Appendectomy   . Back surgery   . Cardiac valve replacement     02/2011  . Cardiac catheterization 10/23/2010    ef 65%  . US echocardiography 08/31/10    EF 55-60%  . Cardiovascular stress test 01/01/2007    EF 64%, NO EVIDENCE OF  ISCHEMIA    History  Smoking status  . Former Smoker  . Quit date: 07/04/1981  Smokeless tobacco  . Not on file    History  Alcohol Use  . 0.6 oz/week  . 1 Glasses of wine per week    No family history on file.  Reviw of Systems:  Reviewed in the HPI.  All other systems are negative.  Physical Exam: Blood pressure 130/68, pulse 74, resp. rate 18, height 5\' 5"  (1.651 m), weight 154 lb 6.4 oz (70.035 kg). General: Well developed, well nourished, in no acute distress.  Head: Normocephalic, atraumatic, sclera non-icteric, mucus membranes are moist,   Neck: Supple. Carotids are 2 + without bruits. No JVD  Lungs: Clear bilaterally to auscultation.  Heart: regular rate.  normal  S1 S2.  There is a 2 / 6 disastolic murmur c/w AI.  Abdomen: Soft, non-tender, non-distended with normal bowel sounds. No hepatomegaly. No rebound/guarding. No masses.  Msk:  Strength and tone are normal  Extremities: No clubbing or cyanosis. No edema.  Distal pedal pulses are 2+ and equal bilaterally.  Neuro: Alert and oriented X 3. Moves all extremities spontaneously.  Psych:  Responds to questions appropriately with a normal affect.  ECG:   Assessment / Plan:

## 2012-01-15 DIAGNOSIS — D500 Iron deficiency anemia secondary to blood loss (chronic): Secondary | ICD-10-CM | POA: Diagnosis not present

## 2012-02-18 DIAGNOSIS — K921 Melena: Secondary | ICD-10-CM | POA: Diagnosis not present

## 2012-02-25 ENCOUNTER — Encounter: Payer: Self-pay | Admitting: Cardiovascular Disease

## 2012-02-25 DIAGNOSIS — Z961 Presence of intraocular lens: Secondary | ICD-10-CM | POA: Diagnosis not present

## 2012-02-25 DIAGNOSIS — H4011X0 Primary open-angle glaucoma, stage unspecified: Secondary | ICD-10-CM | POA: Diagnosis not present

## 2012-02-25 DIAGNOSIS — H409 Unspecified glaucoma: Secondary | ICD-10-CM | POA: Diagnosis not present

## 2012-03-12 DIAGNOSIS — Z954 Presence of other heart-valve replacement: Secondary | ICD-10-CM | POA: Diagnosis not present

## 2012-03-12 DIAGNOSIS — I10 Essential (primary) hypertension: Secondary | ICD-10-CM | POA: Diagnosis not present

## 2012-03-12 DIAGNOSIS — Z006 Encounter for examination for normal comparison and control in clinical research program: Secondary | ICD-10-CM | POA: Diagnosis not present

## 2012-03-17 DIAGNOSIS — D500 Iron deficiency anemia secondary to blood loss (chronic): Secondary | ICD-10-CM | POA: Diagnosis not present

## 2012-03-20 DIAGNOSIS — I447 Left bundle-branch block, unspecified: Secondary | ICD-10-CM | POA: Diagnosis not present

## 2012-03-20 DIAGNOSIS — Z954 Presence of other heart-valve replacement: Secondary | ICD-10-CM | POA: Diagnosis not present

## 2012-03-20 DIAGNOSIS — Z006 Encounter for examination for normal comparison and control in clinical research program: Secondary | ICD-10-CM | POA: Diagnosis not present

## 2012-03-20 DIAGNOSIS — Z09 Encounter for follow-up examination after completed treatment for conditions other than malignant neoplasm: Secondary | ICD-10-CM | POA: Diagnosis not present

## 2012-03-20 DIAGNOSIS — Z5181 Encounter for therapeutic drug level monitoring: Secondary | ICD-10-CM | POA: Diagnosis not present

## 2012-03-20 DIAGNOSIS — Z79899 Other long term (current) drug therapy: Secondary | ICD-10-CM | POA: Diagnosis not present

## 2012-04-14 DIAGNOSIS — D509 Iron deficiency anemia, unspecified: Secondary | ICD-10-CM | POA: Diagnosis not present

## 2012-04-16 DIAGNOSIS — R195 Other fecal abnormalities: Secondary | ICD-10-CM | POA: Diagnosis not present

## 2012-04-16 DIAGNOSIS — D500 Iron deficiency anemia secondary to blood loss (chronic): Secondary | ICD-10-CM | POA: Diagnosis not present

## 2012-05-02 DIAGNOSIS — Z23 Encounter for immunization: Secondary | ICD-10-CM | POA: Diagnosis not present

## 2012-05-14 DIAGNOSIS — D509 Iron deficiency anemia, unspecified: Secondary | ICD-10-CM | POA: Diagnosis not present

## 2012-06-17 DIAGNOSIS — D62 Acute posthemorrhagic anemia: Secondary | ICD-10-CM | POA: Diagnosis not present

## 2012-07-16 ENCOUNTER — Telehealth: Payer: Self-pay | Admitting: Cardiovascular Disease

## 2012-07-16 NOTE — Telephone Encounter (Signed)
Appt made to see Dr. Acie Fredrickson on 07/17/2012.

## 2012-07-16 NOTE — Telephone Encounter (Signed)
New Problem:    Patient's wife called in because her husband has been feeling weak and she was concerned about his heart valve.  Patient fell backwards yesterday.  Patient's blood pressure was taken and it seemed ok.  Wanted to know if he could be seen by Dr. Acie Fredrickson today.  Please call back.

## 2012-07-16 NOTE — Telephone Encounter (Signed)
N/A.  LMTC. 

## 2012-07-16 NOTE — Telephone Encounter (Signed)
Follow-up:    Patient returned your call.  Please call back. 

## 2012-07-17 ENCOUNTER — Encounter: Payer: Self-pay | Admitting: Cardiovascular Disease

## 2012-07-17 ENCOUNTER — Ambulatory Visit (INDEPENDENT_AMBULATORY_CARE_PROVIDER_SITE_OTHER): Payer: Medicare Other | Admitting: Cardiovascular Disease

## 2012-07-17 VITALS — BP 164/70 | HR 67 | Ht 65.0 in | Wt 155.8 lb

## 2012-07-17 DIAGNOSIS — R296 Repeated falls: Secondary | ICD-10-CM | POA: Insufficient documentation

## 2012-07-17 DIAGNOSIS — I359 Nonrheumatic aortic valve disorder, unspecified: Secondary | ICD-10-CM | POA: Diagnosis not present

## 2012-07-17 DIAGNOSIS — E785 Hyperlipidemia, unspecified: Secondary | ICD-10-CM

## 2012-07-17 DIAGNOSIS — W19XXXA Unspecified fall, initial encounter: Secondary | ICD-10-CM

## 2012-07-17 DIAGNOSIS — Z9181 History of falling: Secondary | ICD-10-CM | POA: Diagnosis not present

## 2012-07-17 DIAGNOSIS — R29818 Other symptoms and signs involving the nervous system: Secondary | ICD-10-CM | POA: Diagnosis not present

## 2012-07-17 DIAGNOSIS — R2689 Other abnormalities of gait and mobility: Secondary | ICD-10-CM

## 2012-07-17 LAB — BASIC METABOLIC PANEL
BUN: 22 mg/dL (ref 6–23)
CO2: 25 mEq/L (ref 19–32)
Chloride: 103 mEq/L (ref 96–112)
GFR: 54.6 mL/min — ABNORMAL LOW (ref >60.00)
Glucose, Bld: 95 mg/dL (ref 70–99)
Potassium: 4.4 mEq/L (ref 3.5–5.1)
Sodium: 133 mEq/L — ABNORMAL LOW (ref 135–145)

## 2012-07-17 LAB — HEPATIC FUNCTION PANEL
ALT: 11 U/L (ref 0–53)
Bilirubin, Direct: 0.1 mg/dL (ref 0.0–0.3)
Total Bilirubin: 0.7 mg/dL (ref 0.3–1.2)

## 2012-07-17 LAB — CBC WITH DIFFERENTIAL/PLATELET
Basophils Relative: 0.4 % (ref 0.0–3.0)
Eosinophils Relative: 2.1 % (ref 0.0–5.0)
Hemoglobin: 11.8 g/dL — ABNORMAL LOW (ref 13.0–17.0)
MCV: 94.6 fl (ref 78.0–100.0)
Monocytes Absolute: 0.5 10*3/uL (ref 0.1–1.0)
Neutro Abs: 3.3 10*3/uL (ref 1.4–7.7)
Neutrophils Relative %: 74.2 % (ref 43.0–77.0)
RBC: 3.69 Mil/uL — ABNORMAL LOW (ref 4.22–5.81)
WBC: 4.5 10*3/uL (ref 4.5–10.5)

## 2012-07-17 LAB — LIPID PANEL
HDL: 45.7 mg/dL (ref >39.00)
LDL Cholesterol: 116 mg/dL — ABNORMAL HIGH (ref 0–99)
Total CHOL/HDL Ratio: 4
VLDL: 13.6 mg/dL (ref 0.0–40.0)

## 2012-07-17 NOTE — Patient Instructions (Addendum)
You have been referred to out patient physical therapy of your choice.   Your physician wants you to follow-up in: 6 months  You will receive a reminder letter in the mail two months in advance. If you don't receive a letter, please call our office to schedule the follow-up appointment.  Your physician recommends that you return for a FASTING lipid profile: today with cbc   Your physician has recommended you make the following change in your medication:   Stop lisinopril to see if that will help balance.

## 2012-07-17 NOTE — Progress Notes (Signed)
Bryan Moore Date of Birth  06-22-1920       [facility]    [employer] [address] [place], [place], [place]  [address]   [place], [place]  [address] [phone]     [phone]   Fax  [phone]    Fax [phone]  Problem List: 1. Aortic stenosis-status post TAVR,  2012 2. AV formations with recurrent GI bleeds 3. hypertension 4. Hyperlipidemia. 5. Shingles 6. Frequent Falls ( falls backwards)  History of Present Illness:  Pt has been doing well from a cardiac standpoint.  He denies any episodes of chest pain or shortness breath. He has been falling recently and stopped his lisinopril because he thought it was causing him to have some dizziness. He's been falling backwards.  Current Outpatient Prescriptions on File Prior to Visit  Medication Sig Dispense Refill  . B Complex Vitamins (B-COMPLEX/B-12 PO) Take by mouth daily.        . Cholecalciferol (VITAMIN D PO) Take by mouth daily.        . Cyanocobalamin (VITAMIN B-12 PO) Take by mouth daily.        . dorzolamide-timolol (COSOPT) 22.3-6.8 MG/ML ophthalmic solution 1 drop every 12 (twelve) hours.       . IRON PO Take by mouth daily.        Marland Kitchen latanoprost (XALATAN) 0.005 % ophthalmic solution 1 drop daily.       Marland Kitchen lisinopril (PRINIVIL,ZESTRIL) 10 MG tablet Take 1 tablet (10 mg total) by mouth daily.  30 tablet  11  . Multiple Vitamin (MULTIVITAMIN PO) Take by mouth daily.          Allergies  Allergen Reactions  . Penicillins     Reaction not listed  Review c patient    Past Medical History  Diagnosis Date  . Anemia     from AVM's. Requiring transfusion  . Hypertension   . Hyperlipemia   . Aortic stenosis     s/p TAVI July 2012  . Dementia   . Glaucoma(365)   . GERD (gastroesophageal reflux disease)   . Mitral regurgitation   . Shingles May 2013    Past Surgical History  Procedure Date  . Appendectomy   . Back surgery   . Cardiac valve replacement       02/2011  . Cardiac catheterization 10/23/2010    ef 65%  . US echocardiography 08/31/10    EF 55-60%  . Cardiovascular stress test 01/01/2007    EF 64%, NO EVIDENCE OF ISCHEMIA    History  Smoking status  . Former Smoker  . Quit date: 07/04/1981  Smokeless tobacco  . Not on file    History  Alcohol Use  . 0.6 oz/week  . 1 Glasses of wine per week    No family history on file.  Reviw of Systems:  Reviewed in the HPI.  All other systems are negative.  Physical Exam: Blood pressure 164/70, pulse 67, height 5\' 5"  (1.651 m), weight 155 lb 12.8 oz (70.67 kg), SpO2 97.00%. General: Well developed, well nourished, in no acute distress.  He has an abrasion in the right side of his forehead.    Head: Normocephalic, atraumatic, sclera non-icteric, mucus membranes are moist,   Neck: Supple. Carotids are 2 + without bruits. No JVD  Lungs: Clear bilaterally to auscultation.  Heart: regular rate.  normal  S1 S2.  There is a 2 / 6 disastolic murmur c/w AI.  Abdomen: Soft, non-tender, non-distended with normal bowel sounds. No hepatomegaly. No rebound/guarding. No masses.  Msk:  Strength and tone are normal  Extremities: No clubbing or cyanosis. No edema.  Distal pedal pulses are 2+ and equal bilaterally.  Neuro: Alert and oriented X 3. Moves all extremities spontaneously.  Psych:  Responds to questions appropriately with a normal affect.  ECG:   Assessment / Plan:

## 2012-07-17 NOTE — Assessment & Plan Note (Addendum)
He's had frequent episodes of falling. His wife states that he has been falling backwards. He stopped his lisinopril an effort to improve his balance. I'm not sure that it helped but I am not going to restart the lisinopril at this time.  I have suggested that he needs to use a walker. I've placed an order for outpatient  physical therapy.  We'll check blood work today including a basic metabolic profile, CBC, hepatic profile, and fasting lipids.  He'll need to followup for  further workup of this with his general medical Dr.

## 2012-07-17 NOTE — Assessment & Plan Note (Signed)
He continues to do fairly well. He status post TAVR. He has some aortic insufficiency following the procedure. He seems to be doing better. He is no longer having as much shortness breath or weakness. He still is a little "off balance". He's walking with the assistance of a cane.   I think he may need a walker.   I think some of this may be related to his age. I do not think that it is related to his aortic valve.  We will put in a consult for outpatient physical therapy.  He has been periodically at [facility].  I seen again in 6 months.

## 2012-07-20 ENCOUNTER — Emergency Department (HOSPITAL_COMMUNITY)
Admission: EM | Admit: 2012-07-20 | Discharge: 2012-07-20 | Disposition: A | Discharge status: Home / Self Care | Payer: Medicare Other | Attending: Emergency Medicine | Admitting: Emergency Medicine

## 2012-07-20 ENCOUNTER — Emergency Department (HOSPITAL_COMMUNITY): Payer: Medicare Other

## 2012-07-20 ENCOUNTER — Encounter (HOSPITAL_COMMUNITY): Payer: Self-pay | Admitting: Nurse Practitioner

## 2012-07-20 DIAGNOSIS — S298XXA Other specified injuries of thorax, initial encounter: Secondary | ICD-10-CM | POA: Diagnosis not present

## 2012-07-20 DIAGNOSIS — M549 Dorsalgia, unspecified: Secondary | ICD-10-CM | POA: Insufficient documentation

## 2012-07-20 DIAGNOSIS — Z87891 Personal history of nicotine dependence: Secondary | ICD-10-CM | POA: Diagnosis not present

## 2012-07-20 DIAGNOSIS — Z8719 Personal history of other diseases of the digestive system: Secondary | ICD-10-CM | POA: Diagnosis not present

## 2012-07-20 DIAGNOSIS — S0180XA Unspecified open wound of other part of head, initial encounter: Secondary | ICD-10-CM | POA: Insufficient documentation

## 2012-07-20 DIAGNOSIS — E785 Hyperlipidemia, unspecified: Secondary | ICD-10-CM | POA: Insufficient documentation

## 2012-07-20 DIAGNOSIS — S0003XA Contusion of scalp, initial encounter: Secondary | ICD-10-CM | POA: Diagnosis not present

## 2012-07-20 DIAGNOSIS — Z862 Personal history of diseases of the blood and blood-forming organs and certain disorders involving the immune mechanism: Secondary | ICD-10-CM | POA: Diagnosis not present

## 2012-07-20 DIAGNOSIS — Z79899 Other long term (current) drug therapy: Secondary | ICD-10-CM | POA: Insufficient documentation

## 2012-07-20 DIAGNOSIS — S239XXA Sprain of unspecified parts of thorax, initial encounter: Secondary | ICD-10-CM | POA: Insufficient documentation

## 2012-07-20 DIAGNOSIS — M25539 Pain in unspecified wrist: Secondary | ICD-10-CM | POA: Diagnosis not present

## 2012-07-20 DIAGNOSIS — IMO0002 Reserved for concepts with insufficient information to code with codable children: Secondary | ICD-10-CM | POA: Diagnosis not present

## 2012-07-20 DIAGNOSIS — S1093XA Contusion of unspecified part of neck, initial encounter: Secondary | ICD-10-CM | POA: Insufficient documentation

## 2012-07-20 DIAGNOSIS — Z8619 Personal history of other infectious and parasitic diseases: Secondary | ICD-10-CM | POA: Diagnosis not present

## 2012-07-20 DIAGNOSIS — Y939 Activity, unspecified: Secondary | ICD-10-CM | POA: Insufficient documentation

## 2012-07-20 DIAGNOSIS — I10 Essential (primary) hypertension: Secondary | ICD-10-CM | POA: Insufficient documentation

## 2012-07-20 DIAGNOSIS — Z8669 Personal history of other diseases of the nervous system and sense organs: Secondary | ICD-10-CM | POA: Diagnosis not present

## 2012-07-20 DIAGNOSIS — Z8679 Personal history of other diseases of the circulatory system: Secondary | ICD-10-CM | POA: Insufficient documentation

## 2012-07-20 DIAGNOSIS — R079 Chest pain, unspecified: Secondary | ICD-10-CM | POA: Diagnosis not present

## 2012-07-20 DIAGNOSIS — R296 Repeated falls: Secondary | ICD-10-CM | POA: Insufficient documentation

## 2012-07-20 DIAGNOSIS — Y929 Unspecified place or not applicable: Secondary | ICD-10-CM | POA: Insufficient documentation

## 2012-07-20 DIAGNOSIS — S29019A Strain of muscle and tendon of unspecified wall of thorax, initial encounter: Secondary | ICD-10-CM

## 2012-07-20 MED ORDER — HYDROCODONE-ACETAMINOPHEN 5-500 MG PO TABS: 1.0000 | ORAL_TABLET | Freq: Four times a day (QID) | ORAL | Status: DC | PRN

## 2012-07-20 MED ORDER — HYDROCODONE-ACETAMINOPHEN 5-325 MG PO TABS
1.0000 | ORAL_TABLET | Freq: Once | ORAL | Status: AC
  Administered 2012-07-20: 1 via ORAL
  Filled 2012-07-20: qty 1

## 2012-07-20 NOTE — ED Provider Notes (Signed)
History   This chart was scribed for Bryan Mires, MD by Malen Gauze, ED Scribe. The patient was seen in room TR07C/TR07C and the patient's care was started at 5:34PM.    CSN: [id number]  Arrival date & time 07/20/12  1654   None     Chief Complaint  Patient presents with  . Fall    (Consider location/radiation/quality/duration/timing/severity/associated sxs/prior treatment) The history is provided by the patient. No language interpreter was used.   ROCCI SIRCY is a 78 y.o. male who presents to the Emergency Department complaining of constant, moderate to severe back pain with an onset today. He reports that he was sitting down on the couch and coughed hard, and then that's when the pain started. He also reports a mild pain and bruising to right face/right eyebrow area, mild  left elbow pain, and left mid to upper back pertaining to a fall with head contact but no LOC 5 days ago. Has hx frequent falls, denies abrupt increase in falls. No loc. No faintness or lightheadedness. He initially was not going to seek medical treatment, but when the back pain got worse today after coughing episode, he decided to come into the ED. Tylenol did not alleviate the back pain. Denies severe HAs, fever, neck pain, sore throat, CP, SOB, abdominal pain, nausea, emesis, diarrhea, dysuria, or extremity weakness, numbness, or tingling. He reports he went to the cardiologist the day after the fall; has a Hx of heart valve issues. He does not take blood thinners. Allergic to ASA. No other pertinent medical symptoms.     Past Medical History  Diagnosis Date  . Anemia     from AVM's. Requiring transfusion  . Hypertension   . Hyperlipemia   . Aortic stenosis     s/p TAVI July 2012  . Dementia   . Glaucoma(365)   . GERD (gastroesophageal reflux disease)   . Mitral regurgitation   . Shingles May 2013    Past Surgical History  Procedure Date  . Appendectomy   . Back surgery   . Cardiac valve  replacement     02/2011  . Cardiac catheterization 10/23/2010    ef 65%  . US echocardiography 08/31/10    EF 55-60%  . Cardiovascular stress test 01/01/2007    EF 64%, NO EVIDENCE OF ISCHEMIA    History reviewed. No pertinent family history.  History  Substance Use Topics  . Smoking status: Former Smoker    Quit date: 07/04/1981  . Smokeless tobacco: Not on file  . Alcohol Use: 0.6 oz/week    1 Glasses of wine per week      Review of Systems  HENT: Negative for neck pain.   Respiratory: Negative for shortness of breath.   Cardiovascular: Negative for chest pain and leg swelling.  Gastrointestinal: Negative for nausea, vomiting and abdominal pain.  Neurological: Negative for headaches.   10 Systems reviewed and all are negative for acute change except as noted in the HPI.   Allergies  Advil; Aspirin; and Penicillins  Home Medications   Current Outpatient Rx  Name  Route  Sig  Dispense  Refill  . ACETAMINOPHEN 325 MG PO TABS   Oral   Take 650 mg by mouth every 6 (six) hours as needed. For pain         . VITAMIN D 1000 UNITS PO TABS   Oral   Take 1,000 Units by mouth daily.         . DORZOLAMIDE HCL-TIMOLOL  MAL 22.3-6.8 MG/ML OP SOLN   Both Eyes   Place 1 drop into both eyes every 12 (twelve) hours.          Marland Kitchen POLYSACCHARIDE IRON COMPLEX 150 MG PO CAPS   Oral   Take 150 mg by mouth daily.         Marland Kitchen LATANOPROST 0.005 % OP SOLN   Both Eyes   Place 1 drop into both eyes daily.          . CYANOCOBALAMIN 250 MCG PO TABS   Oral   Take 250 mcg by mouth daily.           BP 152/57  Pulse 73  Temp 97.9 F (36.6 C) (Oral)  Resp 16  SpO2 99%  Physical Exam  Nursing note and vitals reviewed. Constitutional: He is oriented to person, place, and time. He appears well-developed and well-nourished. No distress.       Awake, alert, nontoxic appearance.  HENT:  Head: Normocephalic and atraumatic.       Mild bruising noted about right eye. No sts  noted.   Eyes: Conjunctivae normal are normal. Pupils are equal, round, and reactive to light. Right eye exhibits no discharge. Left eye exhibits no discharge.  Neck: Normal range of motion. Neck supple.  Cardiovascular: Normal rate, regular rhythm, normal heart sounds and intact distal pulses.   Pulmonary/Chest: Effort normal and breath sounds normal. No respiratory distress. He exhibits no tenderness.  Abdominal: Soft. Bowel sounds are normal. He exhibits no distension and no mass. There is no tenderness. There is no rebound and no guarding.       No abd wall contusion or bruising. abd soft nt. No puls mass.   Genitourinary:       No cva tenderness  Musculoskeletal: He exhibits tenderness. He exhibits no edema.       Left posterior rib and mid back tenderness, otherwise, CTLS spine, non tender, aligned, no step off. . Baseline ROM, no obvious new focal weakness.  Neurological: He is alert and oriented to person, place, and time.       Mental status and motor strength appears baseline for patient and situation.  Skin: No rash noted. He is not diaphoretic.  Psychiatric: He has a normal mood and affect.    ED Course  Procedures (including critical care time)  COORDINATION OF CARE:  5:37PM - norco, DG Thoracic Spine 2 View, and DG Ribs Unilateral W/Chest Left will be ordered for [name].   7:00PM - imaging results reviewed and are unremarkable. Pain meds will be Rx for Mr Duskey. He is ready for d/c.  *RADIOLOGY REPORT*  Clinical Data: Fall. Left chest injury and rib pain.  LEFT RIBS AND CHEST - 3+ VIEW  Comparison: 12/11/2011  Findings: No left-sided rib fractures other bone lesions identified. No evidence of pneumothorax or hemothorax.  Both lungs remain clear. Cardiomegaly and ectasia thoracic aorta are stable. An intracardiac prostatic device is again noted.  IMPRESSION: No acute findings.   Original Report Authenticated By: Earle Gell,  M.D.      *RADIOLOGY REPORT*  Clinical Data: Fall. Thoracic back pain.  THORACIC SPINE - 2 VIEW + SWIMMERS  Comparison: Chest radiograph on 12/11/2011  Findings: No evidence of acute fracture or subluxation. Moderate degenerative disc disease is seen throughout the thoracic spine. No focal lytic or sclerotic bone lesions identified.  IMPRESSION:  1. No acute findings. 2. Diffuse thoracic degenerative disc disease.   Original Report Authenticated By: Jenny Reichmann  Kris Hartmann, M.D.      MDM  I personally performed the services described in this documentation, which was scribed in my presence. The recorded information has been reviewed and is accurate.  Xrays.  vicodin 1 po.  Reviewed nursing notes and prior charts for additional history.   Recheck no persistent or recurrent cough. No sob. No fevers.  ?thoracic strain from recent fall exacerbated by coughing episode.   Pt comfortable on recheck.         Bryan Mires, MD 07/20/12 [phone]

## 2012-07-20 NOTE — ED Notes (Signed)
Family at bedside. 

## 2012-07-20 NOTE — ED Notes (Signed)
Pt reports he coughed this after noon and had a sharp pain to LT back . Pt also reports a recent fall on Tues 12-10 -13. On assessment Pt has a bruise to RT eye with a small lac to rt forehead.. Pt also reports pain to Lt elbow.

## 2012-07-20 NOTE — ED Notes (Signed)
Pt reports he lost his balance a week ago and fell onto floor. Laceration to R forehead that is scabbed over now, bruising to R eye, L elbow pain, L side pain. Pt was not going to seek evaluation until he started having severe back pain today while sitting on couch. A&Ox4, breathing easily

## 2012-08-18 DIAGNOSIS — H4011X0 Primary open-angle glaucoma, stage unspecified: Secondary | ICD-10-CM | POA: Diagnosis not present

## 2012-08-18 DIAGNOSIS — H409 Unspecified glaucoma: Secondary | ICD-10-CM | POA: Diagnosis not present

## 2012-08-18 DIAGNOSIS — R195 Other fecal abnormalities: Secondary | ICD-10-CM | POA: Diagnosis not present

## 2012-09-01 DIAGNOSIS — D500 Iron deficiency anemia secondary to blood loss (chronic): Secondary | ICD-10-CM | POA: Diagnosis not present

## 2012-09-01 DIAGNOSIS — R195 Other fecal abnormalities: Secondary | ICD-10-CM | POA: Diagnosis not present

## 2012-09-01 DIAGNOSIS — N189 Chronic kidney disease, unspecified: Secondary | ICD-10-CM | POA: Diagnosis not present

## 2012-09-08 DIAGNOSIS — N189 Chronic kidney disease, unspecified: Secondary | ICD-10-CM | POA: Diagnosis not present

## 2012-09-08 DIAGNOSIS — R195 Other fecal abnormalities: Secondary | ICD-10-CM | POA: Diagnosis not present

## 2012-09-15 DIAGNOSIS — D62 Acute posthemorrhagic anemia: Secondary | ICD-10-CM | POA: Diagnosis not present

## 2012-09-16 ENCOUNTER — Encounter (HOSPITAL_COMMUNITY): Admission: RE | Payer: Self-pay | Source: Ambulatory Visit

## 2012-09-16 ENCOUNTER — Ambulatory Visit (HOSPITAL_COMMUNITY): Admission: RE | Admit: 2012-09-16 | Payer: Medicare Other | Source: Ambulatory Visit | Admitting: Gastroenterology

## 2012-09-16 SURGERY — EGD (ESOPHAGOGASTRODUODENOSCOPY)
Anesthesia: Moderate Sedation

## 2012-09-20 ENCOUNTER — Other Ambulatory Visit: Payer: Self-pay

## 2012-10-01 DIAGNOSIS — D508 Other iron deficiency anemias: Secondary | ICD-10-CM | POA: Diagnosis not present

## 2012-10-02 DIAGNOSIS — D500 Iron deficiency anemia secondary to blood loss (chronic): Secondary | ICD-10-CM | POA: Diagnosis not present

## 2012-10-02 DIAGNOSIS — R195 Other fecal abnormalities: Secondary | ICD-10-CM | POA: Diagnosis not present

## 2012-10-17 DIAGNOSIS — H4011X0 Primary open-angle glaucoma, stage unspecified: Secondary | ICD-10-CM | POA: Diagnosis not present

## 2012-10-17 DIAGNOSIS — H409 Unspecified glaucoma: Secondary | ICD-10-CM | POA: Diagnosis not present

## 2012-10-27 ENCOUNTER — Other Ambulatory Visit: Payer: Self-pay | Admitting: Neurological Surgery

## 2012-10-27 DIAGNOSIS — M47816 Spondylosis without myelopathy or radiculopathy, lumbar region: Secondary | ICD-10-CM

## 2012-10-31 DIAGNOSIS — D500 Iron deficiency anemia secondary to blood loss (chronic): Secondary | ICD-10-CM | POA: Diagnosis not present

## 2012-11-07 ENCOUNTER — Other Ambulatory Visit: Payer: Self-pay | Admitting: Neurological Surgery

## 2012-11-07 ENCOUNTER — Ambulatory Visit
Admission: RE | Admit: 2012-11-07 | Discharge: 2012-11-07 | Disposition: A | Discharge status: Home / Self Care | Payer: Medicare Other | Source: Ambulatory Visit | Attending: Neurological Surgery | Admitting: Neurological Surgery

## 2012-11-07 VITALS — BP 139/63 | HR 68

## 2012-11-07 DIAGNOSIS — M5126 Other intervertebral disc displacement, lumbar region: Secondary | ICD-10-CM | POA: Diagnosis not present

## 2012-11-07 DIAGNOSIS — M47817 Spondylosis without myelopathy or radiculopathy, lumbosacral region: Secondary | ICD-10-CM | POA: Diagnosis not present

## 2012-11-07 DIAGNOSIS — M47816 Spondylosis without myelopathy or radiculopathy, lumbar region: Secondary | ICD-10-CM

## 2012-11-07 MED ORDER — METHYLPREDNISOLONE ACETATE 40 MG/ML INJ SUSP (RADIOLOG
120.0000 mg | Freq: Once | INTRAMUSCULAR | Status: AC
  Administered 2012-11-07: 120 mg via EPIDURAL

## 2012-11-07 MED ORDER — IOHEXOL 180 MG/ML  SOLN
1.0000 mL | Freq: Once | INTRAMUSCULAR | Status: AC | PRN
  Administered 2012-11-07: 1 mL via EPIDURAL

## 2012-12-30 DIAGNOSIS — D500 Iron deficiency anemia secondary to blood loss (chronic): Secondary | ICD-10-CM | POA: Diagnosis not present

## 2013-01-05 ENCOUNTER — Telehealth: Payer: Self-pay | Admitting: Cardiovascular Disease

## 2013-01-05 NOTE — Telephone Encounter (Signed)
New problem   Pt's wife says he is having sweating spells and seems to be tired a lot. He is schedule for 7.28.14 but wife wants him to be seen before then.

## 2013-01-05 NOTE — Telephone Encounter (Signed)
Per wife has been having increased fatigue and has had a couple of episodes of "sweating", denies any pain. Scheduled appointment with Tera Helper NP for January 19, 2013 and advised wife to call back if worse between now and then, verbalized understanding. Patient was due for a 6 month ov

## 2013-01-08 ENCOUNTER — Other Ambulatory Visit: Payer: Self-pay | Admitting: Neurological Surgery

## 2013-01-08 DIAGNOSIS — M47816 Spondylosis without myelopathy or radiculopathy, lumbar region: Secondary | ICD-10-CM

## 2013-01-14 ENCOUNTER — Ambulatory Visit
Admission: RE | Admit: 2013-01-14 | Discharge: 2013-01-14 | Disposition: A | Discharge status: Home / Self Care | Payer: Medicare Other | Source: Ambulatory Visit | Attending: Neurological Surgery | Admitting: Neurological Surgery

## 2013-01-14 DIAGNOSIS — M79609 Pain in unspecified limb: Secondary | ICD-10-CM | POA: Diagnosis not present

## 2013-01-14 DIAGNOSIS — M545 Low back pain: Secondary | ICD-10-CM | POA: Diagnosis not present

## 2013-01-14 DIAGNOSIS — M47816 Spondylosis without myelopathy or radiculopathy, lumbar region: Secondary | ICD-10-CM

## 2013-01-14 DIAGNOSIS — M5126 Other intervertebral disc displacement, lumbar region: Secondary | ICD-10-CM | POA: Diagnosis not present

## 2013-01-14 DIAGNOSIS — M47817 Spondylosis without myelopathy or radiculopathy, lumbosacral region: Secondary | ICD-10-CM | POA: Diagnosis not present

## 2013-01-14 MED ORDER — IOHEXOL 180 MG/ML  SOLN
1.0000 mL | Freq: Once | INTRAMUSCULAR | Status: AC | PRN
  Administered 2013-01-14: 1 mL via EPIDURAL

## 2013-01-14 MED ORDER — METHYLPREDNISOLONE ACETATE 40 MG/ML INJ SUSP (RADIOLOG
120.0000 mg | Freq: Once | INTRAMUSCULAR | Status: AC
  Administered 2013-01-14: 120 mg via EPIDURAL

## 2013-01-19 ENCOUNTER — Encounter: Payer: Self-pay | Admitting: Nurse Practitioner

## 2013-01-19 ENCOUNTER — Ambulatory Visit (INDEPENDENT_AMBULATORY_CARE_PROVIDER_SITE_OTHER): Payer: Medicare Other | Admitting: Nurse Practitioner

## 2013-01-19 VITALS — BP 140/60 | HR 77 | Ht 67.0 in | Wt 151.8 lb

## 2013-01-19 DIAGNOSIS — R5383 Other fatigue: Secondary | ICD-10-CM | POA: Diagnosis not present

## 2013-01-19 DIAGNOSIS — I359 Nonrheumatic aortic valve disorder, unspecified: Secondary | ICD-10-CM | POA: Diagnosis not present

## 2013-01-19 DIAGNOSIS — R5381 Other malaise: Secondary | ICD-10-CM

## 2013-01-19 DIAGNOSIS — I351 Nonrheumatic aortic (valve) insufficiency: Secondary | ICD-10-CM

## 2013-01-19 LAB — CBC WITH DIFFERENTIAL/PLATELET
Basophils Absolute: 0 10*3/uL (ref 0.0–0.1)
Basophils Relative: 0.2 % (ref 0.0–3.0)
Eosinophils Absolute: 0.2 10*3/uL (ref 0.0–0.7)
Eosinophils Relative: 3 % (ref 0.0–5.0)
HCT: 36.5 % — ABNORMAL LOW (ref 39.0–52.0)
Hemoglobin: 12.4 g/dL — ABNORMAL LOW (ref 13.0–17.0)
Lymphocytes Relative: 6.8 % — ABNORMAL LOW (ref 12.0–46.0)
Lymphs Abs: 0.4 10*3/uL — ABNORMAL LOW (ref 0.7–4.0)
MCHC: 34.1 g/dL (ref 30.0–36.0)
MCV: 95.5 fl (ref 78.0–100.0)
Monocytes Absolute: 0.6 10*3/uL (ref 0.1–1.0)
Monocytes Relative: 9.7 % (ref 3.0–12.0)
Neutro Abs: 5.1 10*3/uL (ref 1.4–7.7)
Neutrophils Relative %: 80.3 % — ABNORMAL HIGH (ref 43.0–77.0)
Platelets: 239 10*3/uL (ref 150.0–400.0)
RBC: 3.82 Mil/uL — ABNORMAL LOW (ref 4.22–5.81)
RDW: 15.7 % — ABNORMAL HIGH (ref 11.5–14.6)
WBC: 6.3 10*3/uL (ref 4.5–10.5)

## 2013-01-19 LAB — TSH: TSH: 1.25 u[IU]/mL (ref 0.35–5.50)

## 2013-01-19 LAB — BASIC METABOLIC PANEL
BUN: 30 mg/dL — ABNORMAL HIGH (ref 6–23)
CO2: 28 mEq/L (ref 19–32)
Calcium: 9.6 mg/dL (ref 8.4–10.5)
Chloride: 103 mEq/L (ref 96–112)
Creatinine, Ser: 1.4 mg/dL (ref 0.4–1.5)
GFR: 52.23 mL/min — ABNORMAL LOW (ref >60.00)
Glucose, Bld: 95 mg/dL (ref 70–99)
Potassium: 4.7 mEq/L (ref 3.5–5.1)
Sodium: 138 mEq/L (ref 135–145)

## 2013-01-19 NOTE — Patient Instructions (Addendum)
We need to check labs today  We will get an echo of your heart  Stay on your current medicines  Keep your visit with Dr. Acie Fredrickson in July  Call the [facility] office at [phone] if you have any questions, problems or concerns.

## 2013-01-19 NOTE — Progress Notes (Signed)
Bryan Moore Date of Birth: 06-Jul-1920 Medical Record [id number]  History of Present Illness: Mr. Ezernack is seen back today for a work in visit. Seen for Dr. Acie Fredrickson. He is now 79 years of age. Has had severe AS and had TAVR at [facility] in the summer of 2012 with some residual AI following the procedure. No apparent CAD. Lots of issues with GI bleeding from AVM's and requires transfusions. Other issues include HTN, HLD, GERD and dementia.   Last seen here in December. Had stopped his lisinopril due to dizziness. This had been previously restarted due to marked hypertension. He was noted to be falling more and Dr. Acie Fredrickson felt his issues were more age related.   Comes in today. Here with his wife. She provides most of the history. Has increasing fatigue and a couple of episodes of "sweating". No pain. He can walk on his treadmill for about 10 minutes at a time. He tells me that the only thing wrong is the shingles (which he had a year ago). No swelling. Some shortness of breath. She is quite worried about his valve and wants to know if it is leaking more. She is wanting another echo to follow.   Current Outpatient Prescriptions on File Prior to Visit  Medication Sig Dispense Refill  . acetaminophen (TYLENOL) 325 MG tablet Take 650 mg by mouth every 6 (six) hours as needed. For pain      . cholecalciferol (VITAMIN D) 1000 UNITS tablet Take 1,000 Units by mouth daily.      . dorzolamide-timolol (COSOPT) 22.3-6.8 MG/ML ophthalmic solution Place 1 drop into both eyes every 12 (twelve) hours.       . iron polysaccharides (NIFEREX) 150 MG capsule Take 150 mg by mouth daily.      Marland Kitchen latanoprost (XALATAN) 0.005 % ophthalmic solution Place 1 drop into both eyes daily.       . vitamin B-12 (CYANOCOBALAMIN) 250 MCG tablet Take 250 mcg by mouth daily.       No current facility-administered medications on file prior to visit.    Allergies  Allergen Reactions  . Advil (Ibuprofen) Other (See Comments)   Causes stomach bleeding  . Aspirin Other (See Comments)    Causes stomach bleeding  . Penicillins Itching and Swelling    Past Medical History  Diagnosis Date  . Anemia     from AVM's. Requiring transfusion  . Hypertension   . Hyperlipemia   . Aortic stenosis     s/p TAVI July 2012  . Dementia   . Glaucoma   . GERD (gastroesophageal reflux disease)   . Mitral regurgitation   . Shingles May 2013    Past Surgical History  Procedure Laterality Date  . Appendectomy    . Back surgery    . Cardiac valve replacement      02/2011  . Cardiac catheterization  10/23/2010    ef 65%  . US echocardiography  08/31/10    EF 55-60%  . Cardiovascular stress test  01/01/2007    EF 64%, NO EVIDENCE OF ISCHEMIA    History  Smoking status  . Former Smoker  . Quit date: 07/04/1981  Smokeless tobacco  . Not on file    History  Alcohol Use  . 0.6 oz/week  . 1 Glasses of wine per week    History reviewed. No pertinent family history.  Review of Systems: The review of systems is per the HPI.  All other systems were reviewed and are negative.  Physical Exam: BP 140/60  Pulse 77  Ht 5\' 7"  (1.702 m)  Wt 151 lb 12.8 oz (68.856 kg)  BMI 23.77 kg/m2  SpO2 97% Patient is very pleasant and in no acute distress. Skin is warm and dry. Color is normal.  HEENT is unremarkable. Normocephalic/atraumatic. PERRL. Sclera are nonicteric. Neck is supple. No masses. No JVD. Lungs are clear. Cardiac exam shows a regular rate and rhythm. 2/6 diastolic murmur noted. Abdomen is soft. Extremities are without edema. Gait and ROM are intact. No gross neurologic deficits noted.  LABORATORY DATA: Lab Results  Component Value Date   WBC 4.5 07/17/2012   HGB 11.8* 07/17/2012   HCT 34.9* 07/17/2012   PLT 161.0 07/17/2012   GLUCOSE 95 07/17/2012   CHOL 175 07/17/2012   TRIG 68.0 07/17/2012   HDL 45.70 07/17/2012   LDLCALC 116* 07/17/2012   ALT 11 07/17/2012   AST 24 07/17/2012   NA 133* 07/17/2012   K  4.4 07/17/2012   CL 103 07/17/2012   CREATININE 1.3 07/17/2012   BUN 22 07/17/2012   CO2 25 07/17/2012   TSH 2.116 03/08/2011   INR 1.07 04/03/2011     Assessment / Plan: 1. Fatigue and sweats - unsure etiology - will check some baseline labs. I suspect this is more a reflection of his age and generalized deconditioning.  2. S/P TAVR - with resultant AI - wife is wanting a recheck of his echo. I do not think this will change our management but may help [place] deal with symptoms. Will keep his follow up with Dr. Acie Fredrickson as planned in July.   3. Falls - most likely balance issue.   Call the [facility] office at [phone] if you have any questions, problems or concerns.   Burtis Junes, RN, ANP-C [place] [address] [place], [place]  [address]

## 2013-01-19 NOTE — Addendum Note (Signed)
Addended by: Eulis Foster on: 01/19/2013 03:37 PM   Modules accepted: Orders

## 2013-01-28 ENCOUNTER — Ambulatory Visit (HOSPITAL_COMMUNITY): Payer: Medicare Other | Attending: Cardiovascular Disease | Admitting: Radiology

## 2013-01-28 DIAGNOSIS — I428 Other cardiomyopathies: Secondary | ICD-10-CM

## 2013-01-28 DIAGNOSIS — I10 Essential (primary) hypertension: Secondary | ICD-10-CM | POA: Diagnosis not present

## 2013-01-28 DIAGNOSIS — I059 Rheumatic mitral valve disease, unspecified: Secondary | ICD-10-CM | POA: Insufficient documentation

## 2013-01-28 DIAGNOSIS — R5381 Other malaise: Secondary | ICD-10-CM | POA: Diagnosis not present

## 2013-01-28 DIAGNOSIS — R5383 Other fatigue: Secondary | ICD-10-CM

## 2013-01-28 DIAGNOSIS — I351 Nonrheumatic aortic (valve) insufficiency: Secondary | ICD-10-CM

## 2013-01-28 DIAGNOSIS — I379 Nonrheumatic pulmonary valve disorder, unspecified: Secondary | ICD-10-CM | POA: Insufficient documentation

## 2013-01-28 DIAGNOSIS — H4011X0 Primary open-angle glaucoma, stage unspecified: Secondary | ICD-10-CM | POA: Diagnosis not present

## 2013-01-28 DIAGNOSIS — I079 Rheumatic tricuspid valve disease, unspecified: Secondary | ICD-10-CM | POA: Diagnosis not present

## 2013-01-28 DIAGNOSIS — E785 Hyperlipidemia, unspecified: Secondary | ICD-10-CM | POA: Diagnosis not present

## 2013-01-28 NOTE — Progress Notes (Signed)
Echocardiogram performed.  

## 2013-02-10 DIAGNOSIS — H4011X0 Primary open-angle glaucoma, stage unspecified: Secondary | ICD-10-CM | POA: Diagnosis not present

## 2013-02-10 DIAGNOSIS — H409 Unspecified glaucoma: Secondary | ICD-10-CM | POA: Diagnosis not present

## 2013-03-02 ENCOUNTER — Ambulatory Visit (INDEPENDENT_AMBULATORY_CARE_PROVIDER_SITE_OTHER): Payer: Medicare Other | Admitting: Cardiovascular Disease

## 2013-03-02 ENCOUNTER — Encounter: Payer: Self-pay | Admitting: Cardiovascular Disease

## 2013-03-02 VITALS — BP 118/58 | HR 71 | Ht 67.0 in | Wt 155.4 lb

## 2013-03-02 DIAGNOSIS — I359 Nonrheumatic aortic valve disorder, unspecified: Secondary | ICD-10-CM | POA: Diagnosis not present

## 2013-03-02 DIAGNOSIS — I10 Essential (primary) hypertension: Secondary | ICD-10-CM | POA: Diagnosis not present

## 2013-03-02 NOTE — Patient Instructions (Addendum)
Your physician wants you to follow-up in: 6 months  You will receive a reminder letter in the mail two months in advance. If you don't receive a letter, please call our office to schedule the follow-up appointment.  Your physician recommends that you continue on your current medications as directed. Please refer to the Current Medication list given to you today.  

## 2013-03-02 NOTE — Assessment & Plan Note (Addendum)
Bryan Moore is doing well.  He continues to have some balance issues but I dont think these are related to his aortic valve.    Continue current meds.  His most recent echo shows that his TAV R is working well.  He is scheduled to go back to [facility] soon for follow up .

## 2013-03-02 NOTE — Assessment & Plan Note (Signed)
His BP is well controlled 

## 2013-03-02 NOTE — Progress Notes (Signed)
Bryan Moore Date of Birth  08/06/1920       [name]    [employer] [address], [address], [place], [place]  [address]   [place], [place]  [address] [phone]     [phone]   Fax  [phone]    Fax [phone]  Problem List: 1. Aortic stenosis-status post TAVR,  2012 2. AV formations with recurrent GI bleeds 3. hypertension 4. Hyperlipidemia. 5. Shingles 6. Frequent Falls ( falls backwards)  History of Present Illness:  Pt has been doing well from a cardiac standpoint.  He denies any episodes of chest pain or shortness breath. He has been falling recently and stopped his lisinopril because he thought it was causing him to have some dizziness. He's been falling backwards.  March 02, 2013:  Bryan Moore is doing well.  He has some dyspnea - especially with exertion.  Still have balance issues.    Current Outpatient Prescriptions on File Prior to Visit  Medication Sig Dispense Refill  . acetaminophen (TYLENOL) 325 MG tablet Take 650 mg by mouth every 6 (six) hours as needed. For pain      . brimonidine (ALPHAGAN P) 0.1 % SOLN 1 drop every 8 (eight) hours.      . cholecalciferol (VITAMIN D) 1000 UNITS tablet Take 1,000 Units by mouth daily.      . dorzolamide-timolol (COSOPT) 22.3-6.8 MG/ML ophthalmic solution Place 1 drop into both eyes every 12 (twelve) hours.       . iron polysaccharides (NIFEREX) 150 MG capsule Take 150 mg by mouth daily.      Marland Kitchen latanoprost (XALATAN) 0.005 % ophthalmic solution Place 1 drop into both eyes daily.       . vitamin B-12 (CYANOCOBALAMIN) 250 MCG tablet Take 250 mcg by mouth daily.       No current facility-administered medications on file prior to visit.    Allergies  Allergen Reactions  . Advil (Ibuprofen) Other (See Comments)    Causes stomach bleeding  . Aspirin Other (See Comments)    Causes stomach bleeding  . Penicillins Itching and Swelling    Past Medical History  Diagnosis  Date  . Anemia     from AVM's. Requiring transfusion  . Hypertension   . Hyperlipemia   . Aortic stenosis     s/p TAVI July 2012  . Dementia   . Glaucoma   . GERD (gastroesophageal reflux disease)   . Mitral regurgitation   . Shingles May 2013    Past Surgical History  Procedure Laterality Date  . Appendectomy    . Back surgery    . Cardiac valve replacement      02/2011  . Cardiac catheterization  10/23/2010    ef 65%  . US echocardiography  08/31/10    EF 55-60%  . Cardiovascular stress test  01/01/2007    EF 64%, NO EVIDENCE OF ISCHEMIA    History  Smoking status  . Former Smoker  . Quit date: 07/04/1981  Smokeless tobacco  . Not on file    History  Alcohol Use  . 0.6 oz/week  . 1 Glasses of wine per week    No family history on file.  Reviw of Systems:  Reviewed in the HPI.  All other systems are negative.  Physical Exam: Blood pressure 118/58, pulse 71, height 5\' 7"  (1.702 m), weight 155 lb 6.4 oz (70.489 kg). General: Well developed, well nourished, in no acute distress.  He has an abrasion in the right side of his forehead.    Head: Normocephalic, atraumatic, sclera non-icteric, mucus membranes are moist,   Neck: Supple. Carotids are 2 + without bruits. No JVD  Lungs: Clear bilaterally to auscultation.  Heart: regular rate.  normal  S1 S2.  There is a 2 / 6 disastolic murmur c/w AI.  Abdomen: Soft, non-tender, non-distended with normal bowel sounds. No hepatomegaly. No rebound/guarding. No masses.  Msk:  Strength and tone are normal  Extremities: No clubbing or cyanosis. No edema.  Distal pedal pulses are 2+ and equal bilaterally.  Neuro: Alert and oriented X 3. Moves all extremities spontaneously.  Psych:  Responds to questions appropriately with a normal affect.  ECG: March 02, 2013:  NSR at 83, with occasion PVC.  LBBB.  Assessment / Plan:

## 2013-03-04 DIAGNOSIS — K5100 Ulcerative (chronic) pancolitis without complications: Secondary | ICD-10-CM | POA: Diagnosis not present

## 2013-03-11 ENCOUNTER — Other Ambulatory Visit: Payer: Self-pay

## 2013-03-12 DIAGNOSIS — R82998 Other abnormal findings in urine: Secondary | ICD-10-CM | POA: Diagnosis not present

## 2013-03-12 DIAGNOSIS — N402 Nodular prostate without lower urinary tract symptoms: Secondary | ICD-10-CM | POA: Diagnosis not present

## 2013-03-12 DIAGNOSIS — R3129 Other microscopic hematuria: Secondary | ICD-10-CM | POA: Diagnosis not present

## 2013-03-20 DIAGNOSIS — H409 Unspecified glaucoma: Secondary | ICD-10-CM | POA: Diagnosis not present

## 2013-03-20 DIAGNOSIS — H4011X0 Primary open-angle glaucoma, stage unspecified: Secondary | ICD-10-CM | POA: Diagnosis not present

## 2013-03-26 DIAGNOSIS — R3129 Other microscopic hematuria: Secondary | ICD-10-CM | POA: Diagnosis not present

## 2013-04-01 DIAGNOSIS — R3129 Other microscopic hematuria: Secondary | ICD-10-CM | POA: Diagnosis not present

## 2013-04-01 DIAGNOSIS — N1330 Unspecified hydronephrosis: Secondary | ICD-10-CM | POA: Diagnosis not present

## 2013-04-07 ENCOUNTER — Telehealth: Payer: Self-pay | Admitting: Cardiovascular Disease

## 2013-04-07 ENCOUNTER — Other Ambulatory Visit: Payer: Self-pay | Admitting: Urology

## 2013-04-07 DIAGNOSIS — C670 Malignant neoplasm of trigone of bladder: Secondary | ICD-10-CM | POA: Diagnosis not present

## 2013-04-07 DIAGNOSIS — R3129 Other microscopic hematuria: Secondary | ICD-10-CM | POA: Diagnosis not present

## 2013-04-07 DIAGNOSIS — N402 Nodular prostate without lower urinary tract symptoms: Secondary | ICD-10-CM | POA: Diagnosis not present

## 2013-04-07 DIAGNOSIS — N1330 Unspecified hydronephrosis: Secondary | ICD-10-CM | POA: Diagnosis not present

## 2013-04-07 NOTE — Telephone Encounter (Signed)
New problem   [facility] is needing cardiac clearance for sx for bladder tumor. Please fax to [phone].

## 2013-04-07 NOTE — Telephone Encounter (Signed)
msg left w/ [facility] scheduler for urology procedures, that I faxed clearance and to call if not received.

## 2013-04-07 NOTE — Telephone Encounter (Signed)
Pt was called/ informed that Dr Acie Fredrickson was working in the hospital but would get to an answer today.

## 2013-04-07 NOTE — Telephone Encounter (Signed)
New Problem  Pt has 3 tumors that look cancerous// trying to sch an appt at hosp w// Dr [name].. need Dr. Gardiner Ramus approval. Sending message vs scheduling surg clearance because pt was recently seen on 7/28.

## 2013-04-07 NOTE — Telephone Encounter (Signed)
Bryan Moore is at low risk for his upcoming urology procedure.

## 2013-04-07 NOTE — Telephone Encounter (Signed)
Dr Acie Fredrickson, Please review and advise.

## 2013-04-08 ENCOUNTER — Encounter (HOSPITAL_COMMUNITY): Payer: Self-pay | Admitting: Pharmacy Technician

## 2013-04-08 ENCOUNTER — Encounter (HOSPITAL_COMMUNITY)
Admission: RE | Admit: 2013-04-08 | Discharge: 2013-04-08 | Disposition: A | Discharge status: Home / Self Care | Payer: Medicare Other | Source: Ambulatory Visit | Attending: Urology | Admitting: Urology

## 2013-04-08 ENCOUNTER — Encounter (HOSPITAL_COMMUNITY): Payer: Self-pay

## 2013-04-08 DIAGNOSIS — Z01812 Encounter for preprocedural laboratory examination: Secondary | ICD-10-CM | POA: Insufficient documentation

## 2013-04-08 HISTORY — DX: Personal history of other medical treatment: Z92.89

## 2013-04-08 LAB — BASIC METABOLIC PANEL
Calcium: 9.7 mg/dL (ref 8.4–10.5)
Creatinine, Ser: 1.33 mg/dL (ref 0.50–1.35)
GFR calc non Af Amer: 45 mL/min — ABNORMAL LOW (ref >90)
Sodium: 137 mEq/L (ref 135–145)

## 2013-04-08 LAB — CBC
MCV: 95.2 fL (ref 78.0–100.0)
Platelets: 168 10*3/uL (ref 150–400)
RBC: 3.57 MIL/uL — ABNORMAL LOW (ref 4.22–5.81)
RDW: 15 % (ref 11.5–15.5)
WBC: 4.4 10*3/uL (ref 4.0–10.5)

## 2013-04-08 NOTE — Pre-Procedure Instructions (Signed)
EKG 7'14, Echo 6'14/CXR 12'13 -Epic

## 2013-04-08 NOTE — Patient Instructions (Addendum)
[place]  04/08/2013   Your procedure is scheduled on:  9-8 -2014  Report to [facility] at      0730  AM.   Call this number if you have problems the morning of surgery: [phone]  Or Presurgical Testing [phone](Lithzy Bernard)      Do not eat food:After Midnight.    Take these medicines the morning of surgery with A SIP OF WATER: eye drops, Tylenol.   Do not wear jewelry, make-up or nail polish.  Do not wear lotions, powders, or perfumes. You may wear deodorant.  Do not shave 12 hours prior to first CHG shower(legs and under arms).(face and neck okay.)  Do not bring valuables to the hospital.  Contacts, dentures or bridgework,body piercing,  may not be worn into surgery.  Leave suitcase in the car. After surgery it may be brought to your room.  For patients admitted to the hospital, checkout time is 11:00 AM the day of discharge.   Patients discharged the day of surgery will not be allowed to drive home. Must have responsible person with you x 24 hours once discharged.  Name and phone number of your driver:   Special Instructions: CHG(Chlorhedine 4%-"Hibiclens","Betasept","Aplicare") Shower Use Special Wash: see special instructions.(avoid face and genitals)      Failure to follow these instructions may result in Cancellation of your surgery.   Patient signature_______________________________________________________

## 2013-04-10 NOTE — H&P (Signed)
ctive Problems Problems  1. Benign Prostatic Hypertrophy Without Urinary Obstruction 600.00 2. Cystic Kidney Disease 753.10 3. Hydronephrosis On The Left 591 4. Malignant Neoplasm Of The Trigone Of The Bladder 188.0 5. Microscopic Hematuria 599.72 6. Organic Impotence 607.84 7. Prostate Hard Area Or Nodule 600.10 8. Pyuria 791.9 9. Testicular Atrophy 608.3 10. Urinary Frequency 788.41  History of Present Illness  Mr. Bryan Moore returns today in f/u from a CT done for hematuria and pyuria with a negative culture.   The CT showed moderate left hydro to the bladder with a mass in the left trigone that appears to extend from the prostate but could be bladder origin as well.  His has a known stable prostate nodule but had a stable PSA for many years up until his last in 2011 that was 3.2.   His culture was negative and he never filled the bactrim that was prescribed on 8/7.    He has a Cr of 1.3 and no left flank pain.     Past Medical History Problems  1. History of  Glaucoma 365.9 2. History of  Herpes Zoster (Shingles) 053.9 3. History of  Hypertension 401.9 4. History of  PSA,Elevated 790.93  Surgical History Problems  1. History of  Aortic Valve Replacement 2. History of  Appendectomy 3. History of  Back Surgery  Current Meds 1. Alphagan P 0.1 % Ophthalmic Solution; Therapy: [id number] to 2. Brimonidine Tartrate 0.15 % Ophthalmic Solution; Therapy: [id number] to 3. Dorzolamide HCl-Timolol Mal 22.3-6.8 MG/ML Ophthalmic Solution; Therapy: [id number] to  Allergies Medication  1. Penicillins  Family History Problems  1. Family history of  Family Health Status Number Of Children 1 son; 1 daughter  Social History Problems  1. Alcohol Use Occ glass of wine. 2. Caffeine Use drinks coffee 3. Former Smoker V15.82 4. Marital History - Currently Married 5. Occupation: retired Denied  6. Tobacco Use  Review of Systems  Genitourinary: no hematuria.  Gastrointestinal: no  flank pain.  Constitutional: no fever.    Vitals Vital Signs [Data Includes: Last 1 Day]  02Sep2014 09:11AM  Blood Pressure: 164 / 76 Temperature: 97.5 F Heart Rate: 61  Physical Exam Constitutional: Well nourished and well developed . No acute distress.  Pulmonary: No respiratory distress and normal respiratory rhythm and effort.  Cardiovascular: Heart rate and rhythm are normal . No peripheral edema.  Abdomen: The abdomen is soft and nontender. No masses are palpated. No CVA tenderness. No hernias are palpable. No hepatosplenomegaly noted.  Lymphatics: The supraclavicular, axillary, femoral and inguinal nodes are not enlarged or tender.    Results/Data  The following images/tracing/specimen were independently visualized:  CT films reviewed.  The following clinical lab reports were reviewed:  UA's and BUN/Cr reviewed. Selected Results  BUN & CREATININE 27Aug2014 02:29PM [name]  SPECIMEN TYPE: BLOOD   Test Name Result Flag Reference  CREATININE 1.30 mg/dL  0.50-1.70  BUN 21 mg/dL  6-30  Est GFR, African American 55 mL/min L   PERFORMED AT:        [facility]                      [place]                      [place], [place] [address]  Est GFR, NonAfrican American 48 mL/min L   THE ESTIMATED GFR IS A CALCULATION VALID FOR ADULTS (>=66 YEARS OLD) THAT USES THE CKD-EPI ALGORITHM TO  ADJUST FOR AGE AND SEX. IT IS   NOT TO BE USED FOR CHILDREN, PREGNANT WOMEN, HOSPITALIZED PATIENTS,    PATIENTS ON DIALYSIS, OR WITH RAPIDLY CHANGING KIDNEY FUNCTION. ACCORDING TO THE NKDEP, EGFR >89 IS NORMAL, 60-89 SHOWS MILD IMPAIRMENT, 30-59 SHOWS MODERATE IMPAIRMENT, 15-29 SHOWS SEVERE IMPAIRMENT AND <15 IS ESRD.   AU CT-HEMATURIA PROTOCOL [id number] 12:00AM [name]   Test Name Result Flag Reference  ** RADIOLOGY REPORT BY [facility] **   *RADIOLOGY REPORT*  Clinical Data: Hematuria.  CT ABDOMEN AND PELVIS WITHOUT AND WITH CONTRAST  Technique:  Multidetector CT imaging of the abdomen and pelvis was performed without contrast material in one or both body regions, followed by contrast material(s) and further sections in one or both body regions.  Contrast: 125 ml Isovue 300  Comparison: None.  Findings: Metallic device at the aortic valve is most compatible with a transcatheter aortic valve replacement. Lung bases are clear. No evidence for free air.  There is moderate left hydroureteronephrosis. The left ureter is dilated down to the left ureterovesical junction. No evidence for kidney stones. The patient has multiple bilateral renal cortical cysts. No suspicious renal parenchymal lesions. No evidence for urothelial lesions in the left upper renal collecting system. There is a contrast-fluid level in the distal left ureter and there is concern for obstruction at the left ureterovesical junction. There may be compression of the left ureterovesical junction due to the prostate. Difficult to exclude a mucosal lesion at this location. There may be mild bladder wall thickening along the right side which is poorly characterized. Normal appearance of the right ureter and right renal collecting system.  Prostate is prominent measuring 5.5 x 5.4 x 6.2 cm. No gross abnormality to the seminal vesicles. Diverticulosis in the sigmoid colon without acute inflammation. Normal appearance of the liver, gallbladder, portal venous system, spleen, pancreas and adrenal glands. Evidence for a lipoma in the third portion of the duodenum. Multiple small lymph nodes in the periaortic region are nonspecific and index lesion measures 8 mm in short axis on image number 30, sequence 3. Atherosclerotic disease involving the abdominal aorta without aneurysmal dilatation.  Multilevel degenerative changes in the lumbar spine.  IMPRESSION: Moderate left hydroureteronephrosis. There is a transition point at the left ureterovesical junction. There may  be compression of the left ureterovesical junction from the adjacent prostate but cannot exclude a lesion in this region. This could be better evaluated with cystoscopy.  Bilateral renal cysts.   Original Report Authenticated By: Markus Daft, M.D.   UA With REFLEX 21Aug2014 02:53PM [name]   Test Name Result Flag Reference  COLOR YELLOW  YELLOW  APPEARANCE CLEAR  CLEAR  SPECIFIC GRAVITY 1.020  1.005-1.030  pH 6.0  5.0-8.0  GLUCOSE NEG mg/dL  NEG  BILIRUBIN NEG  NEG  KETONE NEG mg/dL  NEG  BLOOD TRACE A NEG  PROTEIN NEG mg/dL  NEG  UROBILINOGEN 0.2 mg/dL  0.0-1.0  NITRITE NEG  NEG  LEUKOCYTE ESTERASE SMALL A NEG  SQUAMOUS EPITHELIAL/HPF RARE  RARE  WBC 11-20 WBC/hpf A <3  RBC 3-6 RBC/hpf A <3  BACTERIA RARE  RARE  CRYSTALS NONE SEEN  NONE SEEN  CASTS NONE SEEN  NONE SEEN   URINE CULTURE 07Aug2014 03:53PM [name]  SOURCE : CLEAN CATCH SPECIMEN TYPE: CLEAN CATCH   Test Name Result Flag Reference  CULTURE, URINE Culture, Urine    ===== COLONY COUNT: =====  NO GROWTH   FINAL REPORT: NO GROWTH   Procedure  Procedure: Cystoscopy   Indication: History of Urothelial Carcinoma.  Informed Consent: Risks, benefits, and potential adverse events were discussed and informed consent was obtained from the patient.  Prep: The patient was prepped with betadine.  Antibiotic prophylaxis: Ciprofloxacin.  Procedure Note:  Urethral meatus:. No abnormalities.  Anterior urethra: No abnormalities.  Prostatic urethra: No abnormalities . Estimated length was 3 cm. There was visual obstruction of the prostatic urethra. The lateral and median prostatic lobes were enlarged. An enlarged intravesical median lobe was visualized.  Bladder: Visulization was clear. The right ureteral orifice was in the normal anatomic position. The left ureteral orifice was not able to be identified. The mucosa was smooth without abnormalities. Examination of the bladder demonstrated moderate trabeculation.  Multiple tumors were identified in the bladder. (He has several papillary/sessile tumors at the left bladder neck and trigone. The left UO wasn't visualized. ) The patient tolerated the procedure well.  Complications: None.    Assessment Assessed  1. Prostate Hard Area Or Nodule 600.10 2. Microscopic Hematuria 599.72 3. Hydronephrosis On The Left 591 4. Malignant Neoplasm Of The Trigone Of The Bladder 188.0   He has left hydro from a neoplasm at the left trigone and bladder neck that appears most consistent with a urothelial carcinoma.   Plan Health Maintenance (V70.0)  1. UA With REFLEX  Done: [id number] Malignant Neoplasm Of The Trigone Of The Bladder (188.0)  2. Follow-up Schedule Surgery Office  Follow-up  Requested for: 02Sep2014 Prostate Hard Area Or Nodule (600.10)  3. PSA  Done: 02Sep2014 4. VENIPUNCTURE  [id number]   He needs a TURBT with possible left ureteral stent.   I have reviewed the risks of bleeding, infection, injury to the bladder wall, ureter and urethra, need for secondary resections, thrombotic events and anesthetic complications.  Since it is likely invasive with ureteral obstruction, I don't think we will be able to use [facility].   Discussion/Summary  CC: Dr. Legrand Como [name], Dr. Liam Rogers and Dr. Ronald Lobo.

## 2013-04-13 ENCOUNTER — Encounter (HOSPITAL_COMMUNITY)
Admission: RE | Disposition: A | Discharge status: Home / Self Care | Payer: Self-pay | Source: Ambulatory Visit | Attending: Urology

## 2013-04-13 ENCOUNTER — Ambulatory Visit (HOSPITAL_COMMUNITY): Payer: Medicare Other

## 2013-04-13 ENCOUNTER — Inpatient Hospital Stay (HOSPITAL_COMMUNITY)
Admission: RE | Admit: 2013-04-13 | Discharge: 2013-04-15 | DRG: 669 | Disposition: A | Discharge status: Home / Self Care | Payer: Medicare Other | Source: Ambulatory Visit | Attending: Urology | Admitting: Urology

## 2013-04-13 ENCOUNTER — Encounter (HOSPITAL_COMMUNITY): Payer: Self-pay | Admitting: *Deleted

## 2013-04-13 ENCOUNTER — Ambulatory Visit (HOSPITAL_COMMUNITY): Payer: Medicare Other | Admitting: *Deleted

## 2013-04-13 DIAGNOSIS — I10 Essential (primary) hypertension: Secondary | ICD-10-CM | POA: Diagnosis present

## 2013-04-13 DIAGNOSIS — F05 Delirium due to known physiological condition: Secondary | ICD-10-CM | POA: Diagnosis not present

## 2013-04-13 DIAGNOSIS — E785 Hyperlipidemia, unspecified: Secondary | ICD-10-CM | POA: Diagnosis present

## 2013-04-13 DIAGNOSIS — Z87891 Personal history of nicotine dependence: Secondary | ICD-10-CM | POA: Diagnosis not present

## 2013-04-13 DIAGNOSIS — N135 Crossing vessel and stricture of ureter without hydronephrosis: Secondary | ICD-10-CM | POA: Diagnosis not present

## 2013-04-13 DIAGNOSIS — K219 Gastro-esophageal reflux disease without esophagitis: Secondary | ICD-10-CM | POA: Diagnosis present

## 2013-04-13 DIAGNOSIS — Z954 Presence of other heart-valve replacement: Secondary | ICD-10-CM

## 2013-04-13 DIAGNOSIS — Z79899 Other long term (current) drug therapy: Secondary | ICD-10-CM | POA: Diagnosis not present

## 2013-04-13 DIAGNOSIS — H409 Unspecified glaucoma: Secondary | ICD-10-CM | POA: Diagnosis present

## 2013-04-13 DIAGNOSIS — C679 Malignant neoplasm of bladder, unspecified: Secondary | ICD-10-CM | POA: Diagnosis present

## 2013-04-13 DIAGNOSIS — F0390 Unspecified dementia without behavioral disturbance: Secondary | ICD-10-CM | POA: Diagnosis present

## 2013-04-13 DIAGNOSIS — N1330 Unspecified hydronephrosis: Secondary | ICD-10-CM | POA: Diagnosis not present

## 2013-04-13 DIAGNOSIS — C670 Malignant neoplasm of trigone of bladder: Principal | ICD-10-CM | POA: Diagnosis present

## 2013-04-13 DIAGNOSIS — D494 Neoplasm of unspecified behavior of bladder: Secondary | ICD-10-CM | POA: Diagnosis not present

## 2013-04-13 DIAGNOSIS — N400 Enlarged prostate without lower urinary tract symptoms: Secondary | ICD-10-CM | POA: Diagnosis present

## 2013-04-13 HISTORY — PX: TRANSURETHRAL RESECTION OF BLADDER TUMOR WITH GYRUS (TURBT-GYRUS): SHX6458

## 2013-04-13 HISTORY — PX: CYSTOSCOPY WITH STENT PLACEMENT: SHX5790

## 2013-04-13 SURGERY — TRANSURETHRAL RESECTION OF BLADDER TUMOR WITH GYRUS (TURBT-GYRUS)
Anesthesia: General | Wound class: Clean Contaminated

## 2013-04-13 MED ORDER — VITAMIN D3 25 MCG (1000 UNIT) PO TABS
1000.0000 [IU] | ORAL_TABLET | Freq: Every day | ORAL | Status: DC
  Administered 2013-04-13 – 2013-04-15 (×3): 1000 [IU] via ORAL
  Filled 2013-04-13 (×3): qty 1

## 2013-04-13 MED ORDER — LACTATED RINGERS IV SOLN
INTRAVENOUS | Status: DC | PRN
  Administered 2013-04-13 (×2): via INTRAVENOUS

## 2013-04-13 MED ORDER — BRIMONIDINE TARTRATE 0.15 % OP SOLN
1.0000 [drp] | Freq: Three times a day (TID) | OPHTHALMIC | Status: DC
  Administered 2013-04-13 – 2013-04-15 (×5): 1 [drp] via OPHTHALMIC
  Filled 2013-04-13: qty 5

## 2013-04-13 MED ORDER — KCL IN DEXTROSE-NACL 20-5-0.45 MEQ/L-%-% IV SOLN
INTRAVENOUS | Status: DC
  Administered 2013-04-13: 75 mL/h via INTRAVENOUS
  Administered 2013-04-14: 18:00:00 via INTRAVENOUS
  Filled 2013-04-13 (×5): qty 1000

## 2013-04-13 MED ORDER — PROMETHAZINE HCL 25 MG/ML IJ SOLN
INTRAMUSCULAR | Status: AC
  Filled 2013-04-13: qty 1

## 2013-04-13 MED ORDER — PROMETHAZINE HCL 25 MG/ML IJ SOLN
6.2500 mg | INTRAMUSCULAR | Status: DC | PRN
  Administered 2013-04-13: 6.25 mg via INTRAVENOUS

## 2013-04-13 MED ORDER — ONDANSETRON HCL 4 MG/2ML IJ SOLN: 4.0000 mg | INTRAMUSCULAR | Status: DC | PRN

## 2013-04-13 MED ORDER — FENTANYL CITRATE 0.05 MG/ML IJ SOLN
25.0000 ug | INTRAMUSCULAR | Status: DC | PRN
  Administered 2013-04-13: 25 ug via INTRAVENOUS

## 2013-04-13 MED ORDER — DORZOLAMIDE HCL-TIMOLOL MAL 2-0.5 % OP SOLN
1.0000 [drp] | Freq: Two times a day (BID) | OPHTHALMIC | Status: DC
  Administered 2013-04-13 – 2013-04-15 (×4): 1 [drp] via OPHTHALMIC
  Filled 2013-04-13: qty 10

## 2013-04-13 MED ORDER — LISINOPRIL 10 MG PO TABS
10.0000 mg | ORAL_TABLET | Freq: Every day | ORAL | Status: DC
  Administered 2013-04-13 – 2013-04-15 (×3): 10 mg via ORAL
  Filled 2013-04-13 (×3): qty 1

## 2013-04-13 MED ORDER — SODIUM CHLORIDE 0.9 % IR SOLN
Status: DC | PRN
  Administered 2013-04-13: 9000 mL

## 2013-04-13 MED ORDER — ACETAMINOPHEN 325 MG PO TABS: 650.0000 mg | ORAL_TABLET | ORAL | Status: DC | PRN

## 2013-04-13 MED ORDER — SUCCINYLCHOLINE CHLORIDE 20 MG/ML IJ SOLN
INTRAMUSCULAR | Status: DC | PRN
  Administered 2013-04-13: 100 mg via INTRAVENOUS

## 2013-04-13 MED ORDER — FENTANYL CITRATE 0.05 MG/ML IJ SOLN
INTRAMUSCULAR | Status: AC
  Filled 2013-04-13: qty 2

## 2013-04-13 MED ORDER — ONDANSETRON HCL 4 MG/2ML IJ SOLN
INTRAMUSCULAR | Status: DC | PRN
  Administered 2013-04-13: 4 mg via INTRAVENOUS

## 2013-04-13 MED ORDER — IOHEXOL 300 MG/ML  SOLN
INTRAMUSCULAR | Status: DC | PRN
  Administered 2013-04-13: 8 mL via URETHRAL

## 2013-04-13 MED ORDER — HYDROMORPHONE HCL PF 1 MG/ML IJ SOLN: 0.5000 mg | INTRAMUSCULAR | Status: DC | PRN

## 2013-04-13 MED ORDER — PHENYLEPHRINE HCL 10 MG/ML IJ SOLN
INTRAMUSCULAR | Status: DC | PRN
  Administered 2013-04-13: 20 ug via INTRAVENOUS
  Administered 2013-04-13: 10 ug via INTRAVENOUS

## 2013-04-13 MED ORDER — CYANOCOBALAMIN 250 MCG PO TABS
250.0000 ug | ORAL_TABLET | Freq: Every day | ORAL | Status: DC
  Administered 2013-04-13 – 2013-04-15 (×3): 250 ug via ORAL
  Filled 2013-04-13 (×3): qty 1

## 2013-04-13 MED ORDER — POLYSACCHARIDE IRON COMPLEX 150 MG PO CAPS
150.0000 mg | ORAL_CAPSULE | Freq: Every day | ORAL | Status: DC
  Administered 2013-04-13 – 2013-04-15 (×3): 150 mg via ORAL
  Filled 2013-04-13 (×3): qty 1

## 2013-04-13 MED ORDER — MEPERIDINE HCL 50 MG/ML IJ SOLN: 6.2500 mg | INTRAMUSCULAR | Status: DC | PRN

## 2013-04-13 MED ORDER — GLYCOPYRROLATE 0.2 MG/ML IJ SOLN
INTRAMUSCULAR | Status: DC | PRN
  Administered 2013-04-13: 0.2 mg via INTRAVENOUS
  Administered 2013-04-13: 0.6 mg via INTRAVENOUS

## 2013-04-13 MED ORDER — CISATRACURIUM BESYLATE (PF) 10 MG/5ML IV SOLN
INTRAVENOUS | Status: DC | PRN
  Administered 2013-04-13: 8 mg via INTRAVENOUS

## 2013-04-13 MED ORDER — BISACODYL 10 MG RE SUPP: 10.0000 mg | Freq: Every day | RECTAL | Status: DC | PRN

## 2013-04-13 MED ORDER — CIPROFLOXACIN IN D5W 400 MG/200ML IV SOLN
INTRAVENOUS | Status: AC
  Filled 2013-04-13: qty 200

## 2013-04-13 MED ORDER — HYOSCYAMINE SULFATE 0.125 MG SL SUBL
0.1250 mg | SUBLINGUAL_TABLET | SUBLINGUAL | Status: DC | PRN
  Administered 2013-04-13: 15:00:00 0.125 mg via ORAL
  Filled 2013-04-13: qty 1

## 2013-04-13 MED ORDER — ZOLPIDEM TARTRATE 5 MG PO TABS: 5.0000 mg | ORAL_TABLET | Freq: Every evening | ORAL | Status: DC | PRN

## 2013-04-13 MED ORDER — PROPOFOL 10 MG/ML IV BOLUS
INTRAVENOUS | Status: DC | PRN
  Administered 2013-04-13: 150 mg via INTRAVENOUS

## 2013-04-13 MED ORDER — CIPROFLOXACIN IN D5W 400 MG/200ML IV SOLN
400.0000 mg | INTRAVENOUS | Status: AC
  Administered 2013-04-13: 400 mg via INTRAVENOUS

## 2013-04-13 MED ORDER — CIPROFLOXACIN HCL 250 MG PO TABS
250.0000 mg | ORAL_TABLET | Freq: Two times a day (BID) | ORAL | Status: DC
  Administered 2013-04-13 – 2013-04-15 (×4): 250 mg via ORAL
  Filled 2013-04-13 (×6): qty 1

## 2013-04-13 MED ORDER — FENTANYL CITRATE 0.05 MG/ML IJ SOLN
INTRAMUSCULAR | Status: DC | PRN
  Administered 2013-04-13 (×5): 50 ug via INTRAVENOUS

## 2013-04-13 MED ORDER — LATANOPROST 0.005 % OP SOLN
1.0000 [drp] | Freq: Every day | OPHTHALMIC | Status: DC
  Administered 2013-04-14 – 2013-04-15 (×2): 1 [drp] via OPHTHALMIC
  Filled 2013-04-13: qty 2.5

## 2013-04-13 MED ORDER — NEOSTIGMINE METHYLSULFATE 1 MG/ML IJ SOLN
INTRAMUSCULAR | Status: DC | PRN
  Administered 2013-04-13: 4 mg via INTRAVENOUS

## 2013-04-13 MED ORDER — HYDROCODONE-ACETAMINOPHEN 5-325 MG PO TABS
1.0000 | ORAL_TABLET | ORAL | Status: DC | PRN
  Administered 2013-04-13: 1 via ORAL
  Administered 2013-04-14: 2 via ORAL
  Administered 2013-04-14: 1 via ORAL
  Filled 2013-04-13: qty 2
  Filled 2013-04-13 (×2): qty 1

## 2013-04-13 MED ORDER — DOCUSATE SODIUM 100 MG PO CAPS
100.0000 mg | ORAL_CAPSULE | Freq: Two times a day (BID) | ORAL | Status: DC
  Administered 2013-04-13 – 2013-04-15 (×4): 100 mg via ORAL
  Filled 2013-04-13 (×5): qty 1

## 2013-04-13 MED ORDER — LIDOCAINE HCL (CARDIAC) 20 MG/ML IV SOLN
INTRAVENOUS | Status: DC | PRN
  Administered 2013-04-13: 75 mg via INTRAVENOUS

## 2013-04-13 MED ORDER — LIDOCAINE HCL (CARDIAC) 20 MG/ML IV SOLN: INTRAVENOUS | Status: DC | PRN

## 2013-04-13 MED ORDER — LIDOCAINE HCL 2 % EX GEL
CUTANEOUS | Status: AC
  Filled 2013-04-13: qty 10

## 2013-04-13 MED ORDER — ADULT MULTIVITAMIN W/MINERALS CH
1.0000 | ORAL_TABLET | Freq: Every day | ORAL | Status: DC
  Administered 2013-04-13 – 2013-04-15 (×3): 1 via ORAL
  Filled 2013-04-13 (×3): qty 1

## 2013-04-13 SURGICAL SUPPLY — 25 items
BAG URINE DRAINAGE (UROLOGICAL SUPPLIES) IMPLANT
BAG URO CATCHER STRL LF (DRAPE) ×3 IMPLANT
CATH FOLEY 2WAY SLVR  5CC 22FR (CATHETERS) ×1
CATH FOLEY 2WAY SLVR 5CC 22FR (CATHETERS) IMPLANT
CATH FOLEY 3WAY 30CC 22FR (CATHETERS) IMPLANT
CATH URET 5FR 28IN OPEN ENDED (CATHETERS) ×4 IMPLANT
CLOTH BEACON ORANGE TIMEOUT ST (SAFETY) ×3 IMPLANT
DRAPE CAMERA CLOSED 9X96 (DRAPES) ×3 IMPLANT
ELECT BUTTON HF 24-28F 2 30DE (ELECTRODE) ×2 IMPLANT
ELECT LOOP MED HF 24F 12D (CUTTING LOOP) ×3 IMPLANT
ELECT LOOP MED HF 24F 12D CBL (CLIP) ×2 IMPLANT
ELECT RESECT VAPORIZE 12D CBL (ELECTRODE) ×3 IMPLANT
GLOVE SURG SS PI 8.0 STRL IVOR (GLOVE) ×5 IMPLANT
GOWN PREVENTION PLUS XLARGE (GOWN DISPOSABLE) ×3 IMPLANT
GOWN STRL REIN XL XLG (GOWN DISPOSABLE) ×3 IMPLANT
GUIDEWIRE STR DUAL SENSOR (WIRE) ×1 IMPLANT
HOLDER FOLEY CATH W/STRAP (MISCELLANEOUS) IMPLANT
KIT ASPIRATION TUBING (SET/KITS/TRAYS/PACK) ×2 IMPLANT
MANIFOLD NEPTUNE II (INSTRUMENTS) ×3 IMPLANT
PACK CYSTO (CUSTOM PROCEDURE TRAY) ×3 IMPLANT
STENT CONTOUR 6FRX24X.038 (STENTS) ×1 IMPLANT
SUT ETHILON 3 0 PS 1 (SUTURE) IMPLANT
SYR 30ML LL (SYRINGE) ×1 IMPLANT
SYRINGE IRR TOOMEY STRL 70CC (SYRINGE) ×1 IMPLANT
TUBING CONNECTING 10 (TUBING) ×3 IMPLANT

## 2013-04-13 NOTE — Anesthesia Postprocedure Evaluation (Signed)
  Anesthesia Post-op Note  Patient: Bryan Moore  Procedure(s) Performed: Procedure(s) (LRB): TRANSURETHRAL RESECTION OF BLADDER TUMOR WITH GYRUS (TURBT-GYRUS) (N/A)  LEFT URETERAL STENT PLACEMENT (Left)  Patient Location: PACU  Anesthesia Type: General  Level of Consciousness: awake and alert   Airway and Oxygen Therapy: Patient Spontanous Breathing  Post-op Pain: mild  Post-op Assessment: Post-op Vital signs reviewed, Patient's Cardiovascular Status Stable, Respiratory Function Stable, Patent Airway and No signs of Nausea or vomiting  Last Vitals:  Filed Vitals:   04/13/13 1254  BP: 181/97  Pulse: 72  Temp: 36.1 C  Resp: 24    Post-op Vital Signs: stable   Complications: No apparent anesthesia complications

## 2013-04-13 NOTE — Transfer of Care (Signed)
Immediate Anesthesia Transfer of Care Note  Patient: Bryan Moore  Procedure(s) Performed: Procedure(s): TRANSURETHRAL RESECTION OF BLADDER TUMOR WITH GYRUS (TURBT-GYRUS) (N/A)  LEFT URETERAL STENT PLACEMENT (Left)  Patient Location: PACU  Anesthesia Type:General  Level of Consciousness: awake, pateint uncooperative, confused, lethargic and responds to stimulation  Airway & Oxygen Therapy: Patient Spontanous Breathing and Patient connected to face mask oxygen  Post-op Assessment: Report given to PACU RN, Post -op Vital signs reviewed and stable and Patient moving all extremities  Post vital signs: Reviewed and stable  Complications: No apparent anesthesia complications

## 2013-04-13 NOTE — Preoperative (Signed)
Beta Blockers   Reason not to administer Beta Blockers:Not Applicable 

## 2013-04-13 NOTE — Anesthesia Procedure Notes (Signed)
Procedure Name: Intubation Date/Time: 04/13/2013 10:32 AM Performed by: Ofilia Neas Pre-anesthesia Checklist: Patient identified, Timeout performed, Emergency Drugs available, Suction available and Patient being monitored Patient Re-evaluated:Patient Re-evaluated prior to inductionOxygen Delivery Method: Circle system utilized Preoxygenation: Pre-oxygenation with 100% oxygen Intubation Type: IV induction and Cricoid Pressure applied Ventilation: Mask ventilation without difficulty Grade View: Grade II Tube type: Oral (surgeon requests muscle relaxant therefore intubated) Tube size: 7.5 mm Number of attempts: 1 Airway Equipment and Method: Stylet Placement Confirmation: ETT inserted through vocal cords under direct vision,  positive ETCO2 and breath sounds checked- equal and bilateral Secured at: 21 cm Tube secured with: Tape Dental Injury: Teeth and Oropharynx as per pre-operative assessment

## 2013-04-13 NOTE — Anesthesia Preprocedure Evaluation (Addendum)
Anesthesia Evaluation  Patient identified by MRN, date of birth, ID band Patient awake    Reviewed: Allergy & Precautions, H&P , NPO status , Patient's Chart, lab work & pertinent test results  Airway Mallampati: II TM Distance: >3 FB Neck ROM: Full    Dental no notable dental hx.    Pulmonary neg pulmonary ROS,  breath sounds clear to auscultation  Pulmonary exam normal       Cardiovascular hypertension, Pt. on medications Rhythm:Regular Rate:Normal  S/p TAVR 2012 for AS   Neuro/Psych Dementia  negative psych ROS   GI/Hepatic Neg liver ROS, GERD-  ,  Endo/Other  negative endocrine ROS  Renal/GU negative Renal ROS  negative genitourinary   Musculoskeletal negative musculoskeletal ROS (+)   Abdominal   Peds negative pediatric ROS (+)  Hematology negative hematology ROS (+)   Anesthesia Other Findings   Reproductive/Obstetrics negative OB ROS                          Anesthesia Physical Anesthesia Plan  ASA: II  Anesthesia Plan: General   Post-op Pain Management:    Induction: Intravenous  Airway Management Planned: LMA  Additional Equipment:   Intra-op Plan:   Post-operative Plan: Extubation in OR  Informed Consent: I have reviewed the patients History and Physical, chart, labs and discussed the procedure including the risks, benefits and alternatives for the proposed anesthesia with the patient or authorized representative who has indicated his/her understanding and acceptance.   Dental advisory given  Plan Discussed with: CRNA  Anesthesia Plan Comments:         Anesthesia Quick Evaluation

## 2013-04-13 NOTE — Brief Op Note (Signed)
04/13/2013  11:36 AM  PATIENT:  Bryan Moore  79 y.o. male  PRE-OPERATIVE DIAGNOSIS:  bladder tumor with left ureteral obstruction  POST-OPERATIVE DIAGNOSIS:  bladder tumor same  PROCEDURE:  Procedure(s): TRANSURETHRAL RESECTION OF BLADDER TUMOR WITH GYRUS (TURBT-GYRUS) (N/A)  LEFT URETERAL STENT PLACEMENT (Left) CYSTOSCOPY WITH LEFT RTG PYELOGRAM WITH INTERPRETATION  SURGEON:  Surgeon(s) and Role:    * [name], MD - Primary  PHYSICIAN ASSISTANT:   ASSISTANTS: none   ANESTHESIA:   general  EBL:  Total I/O In: 1000 [I.V.:1000] Out: -   BLOOD ADMINISTERED:none  DRAINS: Urinary Catheter (Foley)   LOCAL MEDICATIONS USED:  NONE  SPECIMEN:  Source of Specimen:  bladder tumor  DISPOSITION OF SPECIMEN:  PATHOLOGY  COUNTS:  YES  TOURNIQUET:  * No tourniquets in log *  DICTATION: .Other Dictation: Dictation Number [id number]  PLAN OF CARE: Admit for overnight observation  PATIENT DISPOSITION:  PACU - hemodynamically stable.   Delay start of Pharmacological VTE agent (>24hrs) due to surgical blood loss or risk of bleeding: yes

## 2013-04-13 NOTE — Interval H&P Note (Signed)
History and Physical Interval Note:  04/13/2013 9:58 AM  Bryan Moore  has presented today for surgery, with the diagnosis of bladder tumor  The various methods of treatment have been discussed with the patient and family. After consideration of risks, benefits and other options for treatment, the patient has consented to  Procedure(s): TRANSURETHRAL RESECTION OF BLADDER TUMOR WITH GYRUS (TURBT-GYRUS) (N/A) POSSIBLE LEFT URETERAL STENT PLACEMENT (Left) as a surgical intervention .  The patient's history has been reviewed, patient examined, no change in status, stable for surgery.  I have reviewed the patient's chart and labs.  Questions were answered to the patient's satisfaction.     [name] J

## 2013-04-14 ENCOUNTER — Encounter (HOSPITAL_COMMUNITY): Payer: Self-pay | Admitting: Urology

## 2013-04-14 NOTE — Plan of Care (Signed)
Problem: Phase I Progression Outcomes Goal: Continue bladder and/or hand irrigation Outcome: Not Applicable Date Met:  [id number] Foley D/C'd

## 2013-04-14 NOTE — Progress Notes (Signed)
Patient slept well.  Patient c/o pain only once during the night Will continue to monitor the patient.Marland Kitchen

## 2013-04-14 NOTE — Op Note (Signed)
NAME:  Bryan Moore, Bryan Moore NO.:  [id number]  MEDICAL RECORD NO.:  [id number]  LOCATION:  [id number]                         FACILITY:  [facility]  PHYSICIAN:  Marshall Cork. [name], M.D.    DATE OF BIRTH:  1919/08/21  DATE OF PROCEDURE:  04/13/2013 DATE OF DISCHARGE:                              OPERATIVE REPORT   PREOPERATIVE DIAGNOSIS:  Bladder cancer with left ureteral obstruction.  POSTOPERATIVE DIAGNOSIS:  Multifocal bladder cancer with probable CIS and left ureteral obstruction.  SURGEON:  Marshall Cork. [name], M.D.  ANESTHESIA:  General.  SPECIMEN:  Bladder tumor chips.  DRAINS:  A 6-French 24-cm left double-J stent and 22-French Foley catheter.  COMPLICATIONS:  None.  ESTIMATED BLOOD LOSS:  Minimal.  INDICATIONS:  Mr. Voelkel is a 79 year old white male, who was evaluated for hematuria.  He was found to have left hydronephrosis to the distal ureter and a mass in the bladder that on cystoscopy was consistent with bladder cancer at the left trigone and lateral wall.  It was felt that resection and stent placement was indicated.  FINDINGS OF PROCEDURE:  He was taken to the operating room where he was given Cipro.  A general anesthetic was induced.  He was fitted with PAS hose and placed in lithotomy position.  His perineum and genitalia were prepped with Betadine solution.  He was draped in the usual sterile fashion.  Cystoscopy was performed using a 22-French scope and 12-degree lens.  Examination revealed a normal urethra.  The external sphincter was intact.  The prostatic urethra was approximately 3 cm in length with bilobar hyperplasia with some obstruction.  Examination of the bladder revealed mild trabeculation.  There were multifocal papillary bladder lesions particularly in the left trigone and lateral wall area that in this area covered a diameter of what appeared to be more than 5 cm. There was also a patchy papillary and probable carcinoma in situ, tumor on the  right lateral wall lateral to the trigone.  Additionally, there were papillary lesions and findings consistent with carcinoma at the bladder neck, primarily on the left and dome of the bladder.  After initial cystoscopic inspection, an open-end catheter was passed through the cystoscope to the left ureteral orifice which was somewhat distorted by the surrounding tumor and was not able to get the open-end catheter but I could get a Sensor wire through the open-end catheter and up the ureter.  The open-end catheter was then advanced over the wire and the wire was removed.  There was a brisk hydronephrotic drip.  Contrast was instilled and the retrograde pyelogram was performed.  This revealed a dilated tortuous ureter with no filling defects with the point of obstruction in the last few millimeters of the ureter.  At this point, the guidewire was reinserted to the kidney and the open- end catheter was removed.  The cystoscope was removed leaving the wire in place, and the 28-French continuous resectoscope sheath was placed per urethra alongside the wire.  This was fitted with an [place] handle, a bipolar loop and a 12-degree lens.  Saline was used as the irrigant.  Initially the broad-based papillary lesions of the left lateral wall, and trigone were  resected with muscular fibers and fat noted in the bed of the resection, but no bladder perforation.  I delicately resected around the ureteral meatus including unroofing the distal 1 cm of the meatus which appeared to be the locus of obstruction.  Once this area had been resected and hemostasis achieved, I turned my attention to the right lateral wall and trigonal lesions, which were resected as well.  This patch was approximately 3-4 cm.  Once this resection had been performed, the loop and subsequently the button electrode were used to generously fulgurate the areas that were suspicious for carcinoma in situ, and additionally the  papillary lesions at the dome since they were in a difficult area to actually resect. However, by the end of the procedure, I felt like I had obliterated all visible tumor and hemostasis was achieved.  The bladder had been evacuated free of the chips which were collected for pathologic analysis.  At this point, the cystoscope was reinserted over the wire and a 6- [place] 24-cm double-J stent was inserted under fluoroscopic guidance to the kidney.  The wire was removed leaving a good coil in the kidney and a good coil in the bladder.  The resectoscope was reinserted per urethra and a button electrode was then used to complete the fulguration and ensure good hemostasis. Inspection revealed no retained chips.  The left ureteral orifice had a stent appropriately positioned and the right ureteral orifice was intact, however, there was fulguration near the orifice.  At this point, the scope was removed and a 22-French Foley catheter was inserted.  The balloon was filled with 10 mL of sterile fluid.  The catheter was hand irrigated with clear return.  The catheter was placed to straight drainage.  The patient was taken down from lithotomy position.  His anesthetic was reversed once his paralysis had resolved and he was taken to the recovery room in stable condition.  There were no complications.     Marshall Cork. [name], M.D.     JJW/MEDQ  D:  04/13/2013  T:  04/14/2013  Job:  [id number]

## 2013-04-14 NOTE — Progress Notes (Signed)
Patient confused-- pulled IV out, sitter at bedside. Pt is very bellergerent. Security and the [facility] came up to provide support. Wife called and is coming back to stay with patient. Pt very restless and agitated. Will continue to monitor. SRP, RN, BSN.

## 2013-04-14 NOTE — Progress Notes (Signed)
Post foley removal, patient having difficulty voiding, C/O bladder discomfort, only able to put less than 20cc. Bladder scan up to 463cc. Notified MD, awaiting on call back from MD. Will continue to assess patient.

## 2013-04-14 NOTE — Progress Notes (Signed)
Patient ID: Bryan Moore, male   DOB: 09-22-1919, 79 y.o.   MRN: [id number] 1 Day Post-Op  Subjective: He is doing well and has had no confusion.   His bowel urgency has abated.   Urine is clearing but still slightly cherry colored without clots.  ROS: Negative except as above.  He has no further confusion.   Objective: Vital signs in last 24 hours: Temp:  [97 F (36.1 C)-98.8 F (37.1 C)] 98.4 F (36.9 C) (09/09 0615) Pulse Rate:  [66-79] 71 (09/09 0615) Resp:  [14-24] 18 (09/09 0615) BP: (106-197)/(50-97) 107/50 mmHg (09/09 0615) SpO2:  [97 %-100 %] 100 % (09/09 0615) Weight:  [70.308 kg (155 lb)] 70.308 kg (155 lb) (09/08 1842)  Intake/Output from previous day: 09/08 0701 - 09/09 0700 In: 3408.8 [P.O.:480; I.V.:2878.8] Out: 1525 [Urine:1525] Intake/Output this shift:    General appearance: alert and no distress Resp: clear to auscultation bilaterally Cardio: regular rate and rhythm  Lab Results:  No results found for this basename: WBC, HGB, HCT, PLT,  in the last 72 hours BMET No results found for this basename: NA, K, CL, CO2, GLUCOSE, BUN, CREATININE, CALCIUM,  in the last 72 hours PT/INR No results found for this basename: LABPROT, INR,  in the last 72 hours ABG No results found for this basename: PHART, PCO2, PO2, HCO3,  in the last 72 hours  Studies/Results: Dg C-arm 1-60 Min-no Report  04/13/2013   CLINICAL DATA: stent   C-ARM 1-60 MINUTES  Fluoroscopy was utilized by the requesting physician.  No radiographic  interpretation.     Anti-infectives: Anti-infectives   Start     Dose/Rate Route Frequency Ordered Stop   04/13/13 2200  ciprofloxacin (CIPRO) tablet 250 mg     250 mg Oral 2 times daily 04/13/13 1312     04/13/13 0732  ciprofloxacin (CIPRO) IVPB 400 mg     400 mg 200 mL/hr over 60 Minutes Intravenous 60 min pre-op 04/13/13 0732 04/13/13 1025      Current Facility-Administered Medications  Medication Dose Route Frequency Provider Last Rate Last  Dose  . acetaminophen (TYLENOL) tablet 650 mg  650 mg Oral Q4H PRN [name], MD      . bisacodyl (DULCOLAX) suppository 10 mg  10 mg Rectal Daily PRN [name], MD      . brimonidine (ALPHAGAN) 0.15 % ophthalmic solution 1 drop  1 drop Both Eyes Q8H [name], MD   1 drop at 04/14/13 0604  . cholecalciferol (VITAMIN D) tablet 1,000 Units  1,000 Units Oral Daily [name], MD   1,000 Units at 04/13/13 1800  . ciprofloxacin (CIPRO) tablet 250 mg  250 mg Oral BID [name], MD   250 mg at 04/13/13 2156  . dextrose 5 % and 0.45 % NaCl with KCl 20 mEq/L infusion   Intravenous Continuous [name], MD 75 mL/hr at 04/13/13 1821 75 mL/hr at 04/13/13 1821  . docusate sodium (COLACE) capsule 100 mg  100 mg Oral BID [name], MD   100 mg at 04/13/13 2156  . dorzolamide-timolol (COSOPT) 22.3-6.8 MG/ML ophthalmic solution 1 drop  1 drop Both Eyes Q12H [name], MD   1 drop at 04/13/13 2156  . HYDROcodone-acetaminophen (NORCO/VICODIN) 5-325 MG per tablet 1-2 tablet  1-2 tablet Oral Q4H PRN [name], MD   1 tablet at 04/13/13 2003  . HYDROmorphone (DILAUDID) injection 0.5-1 mg  0.5-1 mg Intravenous Q2H PRN Marshall Cork  [name], MD      . hyoscyamine (LEVSIN SL) SL tablet 0.125 mg  0.125 mg Oral Q4H PRN [name], MD   0.125 mg at 04/13/13 1451  . iron polysaccharides (NIFEREX) capsule 150 mg  150 mg Oral Daily [name], MD   150 mg at 04/13/13 1822  . latanoprost (XALATAN) 0.005 % ophthalmic solution 1 drop  1 drop Both Eyes Daily [name], MD      . lisinopril (PRINIVIL,ZESTRIL) tablet 10 mg  10 mg Oral Daily [name], MD   10 mg at 04/13/13 1822  . multivitamin with minerals tablet 1 tablet  1 tablet Oral Daily [name], MD   1 tablet at 04/13/13 1822  . ondansetron (ZOFRAN) injection 4 mg  4 mg Intravenous Q4H PRN [name], MD      . vitamin B-12 (CYANOCOBALAMIN) tablet 250 mcg  250 mcg Oral Daily [name], MD   250 mcg at 04/13/13 1823  . zolpidem (AMBIEN)  tablet 5 mg  5 mg Oral QHS PRN [name], MD        Assessment: s/p Procedure(s): TRANSURETHRAL RESECTION OF BLADDER TUMOR WITH GYRUS (TURBT-GYRUS)  LEFT URETERAL STENT PLACEMENT  Doing well post op but the urine is still a bit bloody.   Plan: I will keep him until tomorrow and remove the foley then prior to discharge.      LOS: 1 day    Deke Tilghman J 04/14/2013

## 2013-04-14 NOTE — Progress Notes (Signed)
Foley cath replace, patient tolerated well, 400cc dark red urine with small clot return, patient reported free of bladder discomfort. Will continue to assess patient.

## 2013-04-15 DIAGNOSIS — C679 Malignant neoplasm of bladder, unspecified: Secondary | ICD-10-CM | POA: Diagnosis present

## 2013-04-15 DIAGNOSIS — F05 Delirium due to known physiological condition: Secondary | ICD-10-CM | POA: Diagnosis not present

## 2013-04-15 DIAGNOSIS — N1330 Unspecified hydronephrosis: Secondary | ICD-10-CM | POA: Diagnosis present

## 2013-04-15 MED ORDER — CIPROFLOXACIN HCL 250 MG PO TABS: 250.0000 mg | ORAL_TABLET | Freq: Two times a day (BID) | ORAL | Status: DC

## 2013-04-15 MED ORDER — HYDROCODONE-ACETAMINOPHEN 5-325 MG PO TABS: 1.0000 | ORAL_TABLET | Freq: Four times a day (QID) | ORAL | Status: DC | PRN

## 2013-04-15 NOTE — Progress Notes (Signed)
Pt continues to be confused and agitated.Patient refuse to have the IV replaced. Wife has arrived and is at the bedside with patient. Sitter continues to sit with patient. SRP, Hydrographic surveyor.

## 2013-04-15 NOTE — Progress Notes (Signed)
Bladder scanned pt got 424ml.  Paged Dr [name] Dr. [name] called back to put 18 french foley and then patient will be discharged with foley.  Patient may shower.

## 2013-04-15 NOTE — Discharge Summary (Signed)
Physician Discharge Summary  Patient ID: Bryan Moore MRN: [id number] DOB/AGE: 77-Mar-1921 77 y.o.  Admit date: 04/13/2013 Discharge date: 04/15/2013  Admission Diagnoses:  Bladder cancer  Discharge Diagnoses:  Principal Problem:   Bladder cancer Active Problems:   Hydronephrosis of left kidney   Sundown syndrome   Past Medical History  Diagnosis Date  . Anemia     from AVM's. Requiring transfusion  . Hyperlipemia   . Aortic stenosis     s/p TAVI July 2012  . Dementia   . Glaucoma   . GERD (gastroesophageal reflux disease)   . Mitral regurgitation   . Shingles May 2013    right arm residual pain- last outbreak 1 1/2 yrs ago.  Marland Kitchen Hypertension     Not taking Lisinopril due to BP drops low.  . Transfusion history     last 2 yrs ago.    Surgeries: Procedure(s): TRANSURETHRAL RESECTION OF BLADDER TUMOR WITH GYRUS (TURBT-GYRUS)  LEFT URETERAL STENT PLACEMENT on 04/13/2013   Consultants (if any):    Discharged Condition: Improved  Hospital Course: Bryan Moore is an 79 y.o. male who was admitted 04/13/2013 with a diagnosis of Bladder cancer and went to the operating room on 04/13/2013 and underwent the above named procedures.  Postoperatively he did well but he did have some sundowning with confusion.   His UA today has nearly cleared and the foley will be removed.   He will be discharged when voiding.    His path showed high grade T1 urothelial carcinoma without muscle invasion.   He was given perioperative antibiotics:  Anti-infectives   Start     Dose/Rate Route Frequency Ordered Stop   04/15/13 0000  ciprofloxacin (CIPRO) 250 MG tablet     250 mg Oral 2 times daily 04/15/13 0758     04/13/13 2200  ciprofloxacin (CIPRO) tablet 250 mg     250 mg Oral 2 times daily 04/13/13 1312     04/13/13 0732  ciprofloxacin (CIPRO) IVPB 400 mg     400 mg 200 mL/hr over 60 Minutes Intravenous 60 min pre-op 04/13/13 0732 04/13/13 1025    .  He was given sequential compression  devices for DVT prophylaxis.  He benefited maximally from the hospital stay and there were no complications other than the sundowning.  Recent vital signs:  Filed Vitals:   04/15/13 0726  BP: 152/57  Pulse: 76  Temp: 98.2 F (36.8 C)  Resp: 20    Recent laboratory studies:  Lab Results  Component Value Date   HGB 11.5* 04/08/2013   HGB 12.4* 01/19/2013   HGB 11.8* 07/17/2012   Lab Results  Component Value Date   WBC 4.4 04/08/2013   PLT 168 04/08/2013   Lab Results  Component Value Date   INR 1.07 04/03/2011   Lab Results  Component Value Date   NA 137 04/08/2013   K 4.2 04/08/2013   CL 102 04/08/2013   CO2 28 04/08/2013   BUN 25* 04/08/2013   CREATININE 1.33 04/08/2013   GLUCOSE 83 04/08/2013    Discharge Medications:     Medication List         acetaminophen 325 MG tablet  Commonly known as:  TYLENOL  Take 650 mg by mouth every 6 (six) hours as needed. For pain     brimonidine 0.1 % Soln  Commonly known as:  ALPHAGAN P  Place 1 drop into both eyes every 8 (eight) hours.     cholecalciferol 1000 UNITS tablet  Commonly known as:  VITAMIN D  Take 1,000 Units by mouth daily.     ciprofloxacin 250 MG tablet  Commonly known as:  CIPRO  Take 1 tablet (250 mg total) by mouth 2 (two) times daily.     dorzolamide-timolol 22.3-6.8 MG/ML ophthalmic solution  Commonly known as:  COSOPT  Place 1 drop into both eyes every 12 (twelve) hours.     HYDROcodone-acetaminophen 5-325 MG per tablet  Commonly known as:  NORCO/VICODIN  Take 1 tablet by mouth every 6 (six) hours as needed.     iron polysaccharides 150 MG capsule  Commonly known as:  NIFEREX  Take 150 mg by mouth daily.     latanoprost 0.005 % ophthalmic solution  Commonly known as:  XALATAN  Place 1 drop into both eyes daily.     lisinopril 10 MG tablet  Commonly known as:  PRINIVIL,ZESTRIL  Take 10 mg by mouth daily. 04-08-13 Med being held at present-BP drops low     multivitamin with minerals Tabs tablet  Take 1  tablet by mouth daily.     vitamin B-12 250 MCG tablet  Commonly known as:  CYANOCOBALAMIN  Take 250 mcg by mouth daily.        Diagnostic Studies: Dg C-arm 1-60 Min-no Report  04/13/2013   CLINICAL DATA: stent   C-ARM 1-60 MINUTES  Fluoroscopy was utilized by the requesting physician.  No radiographic  interpretation.    pathology   Disposition: 01-Home or Self Care      Discharge Orders   Future Orders Complete By Expires   Discontinue IV  As directed       Follow-up Information   Follow up with [name], MD On 04/21/2013. (130)    Specialty:  Urology   Contact information:   [address] [place] [address] [phone]        Signed: [name] 04/15/2013, 8:01 AM

## 2013-04-17 ENCOUNTER — Encounter (HOSPITAL_COMMUNITY): Payer: Self-pay

## 2013-04-17 ENCOUNTER — Emergency Department (HOSPITAL_COMMUNITY)
Admission: EM | Admit: 2013-04-17 | Discharge: 2013-04-18 | Disposition: A | Discharge status: Home / Self Care | Payer: Medicare Other | Attending: Emergency Medicine | Admitting: Emergency Medicine

## 2013-04-17 DIAGNOSIS — Z8719 Personal history of other diseases of the digestive system: Secondary | ICD-10-CM | POA: Diagnosis not present

## 2013-04-17 DIAGNOSIS — Z8639 Personal history of other endocrine, nutritional and metabolic disease: Secondary | ICD-10-CM | POA: Insufficient documentation

## 2013-04-17 DIAGNOSIS — R300 Dysuria: Secondary | ICD-10-CM | POA: Insufficient documentation

## 2013-04-17 DIAGNOSIS — R319 Hematuria, unspecified: Secondary | ICD-10-CM | POA: Insufficient documentation

## 2013-04-17 DIAGNOSIS — IMO0002 Reserved for concepts with insufficient information to code with codable children: Secondary | ICD-10-CM

## 2013-04-17 DIAGNOSIS — H409 Unspecified glaucoma: Secondary | ICD-10-CM | POA: Diagnosis not present

## 2013-04-17 DIAGNOSIS — Y939 Activity, unspecified: Secondary | ICD-10-CM | POA: Insufficient documentation

## 2013-04-17 DIAGNOSIS — Z880 Allergy status to penicillin: Secondary | ICD-10-CM | POA: Diagnosis not present

## 2013-04-17 DIAGNOSIS — Z862 Personal history of diseases of the blood and blood-forming organs and certain disorders involving the immune mechanism: Secondary | ICD-10-CM | POA: Insufficient documentation

## 2013-04-17 DIAGNOSIS — Z9089 Acquired absence of other organs: Secondary | ICD-10-CM | POA: Insufficient documentation

## 2013-04-17 DIAGNOSIS — I10 Essential (primary) hypertension: Secondary | ICD-10-CM | POA: Diagnosis not present

## 2013-04-17 DIAGNOSIS — R141 Gas pain: Secondary | ICD-10-CM | POA: Diagnosis not present

## 2013-04-17 DIAGNOSIS — T148XXA Other injury of unspecified body region, initial encounter: Secondary | ICD-10-CM | POA: Diagnosis not present

## 2013-04-17 DIAGNOSIS — Z8619 Personal history of other infectious and parasitic diseases: Secondary | ICD-10-CM | POA: Insufficient documentation

## 2013-04-17 DIAGNOSIS — N400 Enlarged prostate without lower urinary tract symptoms: Secondary | ICD-10-CM | POA: Diagnosis not present

## 2013-04-17 DIAGNOSIS — F0390 Unspecified dementia without behavioral disturbance: Secondary | ICD-10-CM | POA: Insufficient documentation

## 2013-04-17 DIAGNOSIS — S41009A Unspecified open wound of unspecified shoulder, initial encounter: Secondary | ICD-10-CM | POA: Diagnosis not present

## 2013-04-17 DIAGNOSIS — Z79899 Other long term (current) drug therapy: Secondary | ICD-10-CM | POA: Diagnosis not present

## 2013-04-17 DIAGNOSIS — N390 Urinary tract infection, site not specified: Secondary | ICD-10-CM | POA: Diagnosis not present

## 2013-04-17 DIAGNOSIS — R142 Eructation: Secondary | ICD-10-CM | POA: Insufficient documentation

## 2013-04-17 DIAGNOSIS — R339 Retention of urine, unspecified: Secondary | ICD-10-CM | POA: Diagnosis not present

## 2013-04-17 DIAGNOSIS — D649 Anemia, unspecified: Secondary | ICD-10-CM | POA: Insufficient documentation

## 2013-04-17 DIAGNOSIS — W19XXXA Unspecified fall, initial encounter: Secondary | ICD-10-CM | POA: Insufficient documentation

## 2013-04-17 DIAGNOSIS — Z87891 Personal history of nicotine dependence: Secondary | ICD-10-CM | POA: Insufficient documentation

## 2013-04-17 DIAGNOSIS — R109 Unspecified abdominal pain: Secondary | ICD-10-CM | POA: Insufficient documentation

## 2013-04-17 DIAGNOSIS — Y929 Unspecified place or not applicable: Secondary | ICD-10-CM | POA: Insufficient documentation

## 2013-04-17 DIAGNOSIS — C679 Malignant neoplasm of bladder, unspecified: Secondary | ICD-10-CM | POA: Diagnosis not present

## 2013-04-17 NOTE — ED Notes (Signed)
Per pt, bladder tumors removed Monday and had foley placed.  Had foley removed at 9:30 this morning.  Pt has not been able to urinate.  He came by at 3pm and had 200 cc urine.  They did not place cath at that time at urology center.  Pt still unable to urinate.

## 2013-04-17 NOTE — ED Notes (Signed)
Pt also has skin tear to rt arm that needs checked.

## 2013-04-18 LAB — URINALYSIS, ROUTINE W REFLEX MICROSCOPIC
Bilirubin Urine: NEGATIVE
Nitrite: NEGATIVE
Specific Gravity, Urine: 1.021 (ref 1.005–1.030)
Urobilinogen, UA: 0.2 mg/dL (ref 0.0–1.0)

## 2013-04-18 LAB — URINE MICROSCOPIC-ADD ON

## 2013-04-18 MED ORDER — CIPROFLOXACIN HCL 250 MG PO TABS: 250.0000 mg | ORAL_TABLET | Freq: Two times a day (BID) | ORAL | Status: DC

## 2013-04-18 NOTE — ED Provider Notes (Signed)
CSN: [id number]     Arrival date & time 04/17/13  2317 History   First MD Initiated Contact with Patient 04/17/13 2346     Chief Complaint  Patient presents with  . Urinary Retention   (Consider location/radiation/quality/duration/timing/severity/associated sxs/prior Treatment) HPI Patient underwent bladder surgery earlier this week performed by Dr.Wrenn. He had a Foley catheter left in place. Had followup with urology this morning and a Foley catheter was removed. Patient has difficulty urinating all day. He did produce 200 cc of urine at roughly 3 PM but has not urinated since. He is in an moderate amount of discomfort complaining of lower, swelling and pain. He denies fevers or chills. No nausea or vomiting.  Past Medical History  Diagnosis Date  . Anemia     from AVM's. Requiring transfusion  . Hyperlipemia   . Aortic stenosis     s/p TAVI July 2012  . Dementia   . Glaucoma   . GERD (gastroesophageal reflux disease)   . Mitral regurgitation   . Shingles May 2013    right arm residual pain- last outbreak 1 1/2 yrs ago.  Marland Kitchen Hypertension     Not taking Lisinopril due to BP drops low.  . Transfusion history     last 2 yrs ago.   Past Surgical History  Procedure Laterality Date  . Appendectomy    . Back surgery    . Cardiac catheterization  10/23/2010    ef 65%  . US echocardiography  08/31/10    EF 55-60%  . Cardiovascular stress test  01/01/2007    EF 64%, NO EVIDENCE OF ISCHEMIA  . Cardiac valve replacement      02/2011([facility])  . Transurethral resection of bladder tumor with gyrus (turbt-gyrus) N/A 04/13/2013    Procedure: TRANSURETHRAL RESECTION OF BLADDER TUMOR WITH GYRUS (TURBT-GYRUS);  Surgeon: [name], MD;  Location: [facility];  Service: Urology;  Laterality: N/A;  . Cystoscopy with stent placement Left 04/13/2013    Procedure:  LEFT URETERAL STENT PLACEMENT;  Surgeon: [name], MD;  Location: [facility];  Service: Urology;  Laterality: Left;   History reviewed. No  pertinent family history. History  Substance Use Topics  . Smoking status: Former Smoker    Quit date: 07/04/1981  . Smokeless tobacco: Not on file  . Alcohol Use: 0.6 oz/week    1 Glasses of wine per week     Comment: nightly    Review of Systems  Constitutional: Negative for fever and chills.  Respiratory: Negative for shortness of breath.   Cardiovascular: Negative for chest pain.  Gastrointestinal: Positive for abdominal pain and abdominal distention. Negative for nausea, vomiting and diarrhea.  Genitourinary: Positive for difficulty urinating. Negative for frequency and hematuria.  Musculoskeletal: Negative for back pain.  Skin: Negative for rash and wound.  Neurological: Negative for dizziness, weakness, light-headedness, numbness and headaches.    Allergies  Advil; Aspirin; and Penicillins  Home Medications   Current Outpatient Rx  Name  Route  Sig  Dispense  Refill  . acetaminophen (TYLENOL) 325 MG tablet   Oral   Take 650 mg by mouth every 6 (six) hours as needed for pain. For pain         . brimonidine (ALPHAGAN P) 0.1 % SOLN   Both Eyes   Place 1 drop into both eyes 2 (two) times daily.          . cholecalciferol (VITAMIN D) 1000 UNITS tablet   Oral   Take 1,000  Units by mouth every morning.          . dorzolamide-timolol (COSOPT) 22.3-6.8 MG/ML ophthalmic solution   Both Eyes   Place 1 drop into both eyes every 12 (twelve) hours.          Marland Kitchen HYDROcodone-acetaminophen (NORCO/VICODIN) 5-325 MG per tablet   Oral   Take 1 tablet by mouth every 6 (six) hours as needed for pain.         . iron polysaccharides (NIFEREX) 150 MG capsule   Oral   Take 150 mg by mouth every morning.          . latanoprost (XALATAN) 0.005 % ophthalmic solution   Both Eyes   Place 1 drop into both eyes daily.          Marland Kitchen lisinopril (PRINIVIL,ZESTRIL) 10 MG tablet   Oral   Take 10 mg by mouth daily as needed (high blood pressure).          . Multiple Vitamin  (MULTIVITAMIN WITH MINERALS) TABS tablet   Oral   Take 1 tablet by mouth every morning.          . silodosin (RAPAFLO) 8 MG CAPS capsule   Oral   Take 8 mg by mouth every evening.         . vitamin B-12 (CYANOCOBALAMIN) 250 MCG tablet   Oral   Take 250 mcg by mouth every morning.          . ciprofloxacin (CIPRO) 250 MG tablet   Oral   Take 1 tablet (250 mg total) by mouth 2 (two) times daily.   6 tablet   0    BP 156/100  Pulse 119  Temp(Src) 97.5 F (36.4 C) (Oral)  Resp 20  SpO2 100% Physical Exam  Nursing note and vitals reviewed. Constitutional: He is oriented to person, place, and time. He appears well-developed and well-nourished. He appears distressed.  Patient is uncomfortable appearing.  HENT:  Head: Normocephalic and atraumatic.  Mouth/Throat: Oropharynx is clear and moist.  Eyes: EOM are normal. Pupils are equal, round, and reactive to light.  Neck: Normal range of motion. Neck supple.  Cardiovascular: Normal rate and regular rhythm.   Pulmonary/Chest: Effort normal and breath sounds normal. No respiratory distress. He has no wheezes. He has no rales.  Abdominal: Soft. Bowel sounds are normal. He exhibits distension (lower abdominal distention). There is tenderness (Mild tenderness to palpation over his lower abdomen). There is no rebound and no guarding.  Musculoskeletal: Normal range of motion. He exhibits no edema and no tenderness.  Neurological: He is alert and oriented to person, place, and time.  Moves all extremities without deficit. Sensation grossly intact.  Skin: Skin is warm and dry. No rash noted. No erythema.  Psychiatric: He has a normal mood and affect. His behavior is normal.    ED Course  Procedures (including critical care time) Labs Review Labs Reviewed  URINALYSIS, ROUTINE W REFLEX MICROSCOPIC   Imaging Review No results found.  MDM  Patient appears to be in acute urinary retention. Place Foley catheter and  reassess.  Patient's wife wanted a skin tear to his right shoulder evaluated. She states she fell several days ago and sustained it. She placing bacitracin on it. Patient states he has no pain at the site. Has full range of motion at the right shoulder. The patient has a superficial skin tear over the right deltoid. There is no evidence of infection. Bacitracin was placed in the emergency department and  the wound is dressed.  Patient with immediate relief after foley catheter placed. Foley drained grossly bloody urine. Question of urinary tract infection. Will restart Cipro and have patient followup with his urologist as soon as possible. Return precautions given  Julianne Rice, MD 04/18/13 [phone]

## 2013-04-20 DIAGNOSIS — R339 Retention of urine, unspecified: Secondary | ICD-10-CM | POA: Diagnosis not present

## 2013-04-20 DIAGNOSIS — N400 Enlarged prostate without lower urinary tract symptoms: Secondary | ICD-10-CM | POA: Diagnosis not present

## 2013-04-27 DIAGNOSIS — D500 Iron deficiency anemia secondary to blood loss (chronic): Secondary | ICD-10-CM | POA: Diagnosis not present

## 2013-04-29 DIAGNOSIS — R339 Retention of urine, unspecified: Secondary | ICD-10-CM | POA: Diagnosis not present

## 2013-04-29 DIAGNOSIS — N1330 Unspecified hydronephrosis: Secondary | ICD-10-CM | POA: Diagnosis not present

## 2013-05-20 DIAGNOSIS — N400 Enlarged prostate without lower urinary tract symptoms: Secondary | ICD-10-CM | POA: Diagnosis not present

## 2013-05-20 DIAGNOSIS — R82998 Other abnormal findings in urine: Secondary | ICD-10-CM | POA: Diagnosis not present

## 2013-05-20 DIAGNOSIS — N1330 Unspecified hydronephrosis: Secondary | ICD-10-CM | POA: Diagnosis not present

## 2013-05-20 DIAGNOSIS — C679 Malignant neoplasm of bladder, unspecified: Secondary | ICD-10-CM | POA: Diagnosis not present

## 2013-05-21 DIAGNOSIS — D500 Iron deficiency anemia secondary to blood loss (chronic): Secondary | ICD-10-CM | POA: Diagnosis not present

## 2013-05-22 DIAGNOSIS — Z23 Encounter for immunization: Secondary | ICD-10-CM | POA: Diagnosis not present

## 2013-05-25 ENCOUNTER — Other Ambulatory Visit: Payer: Self-pay | Admitting: Urology

## 2013-05-27 ENCOUNTER — Encounter (HOSPITAL_COMMUNITY): Payer: Self-pay | Admitting: Pharmacy Technician

## 2013-06-01 ENCOUNTER — Encounter (HOSPITAL_COMMUNITY)
Admission: RE | Admit: 2013-06-01 | Discharge: 2013-06-01 | Disposition: A | Discharge status: Home / Self Care | Payer: Medicare Other | Source: Ambulatory Visit | Attending: Urology | Admitting: Urology

## 2013-06-01 ENCOUNTER — Encounter (HOSPITAL_COMMUNITY): Payer: Self-pay

## 2013-06-01 ENCOUNTER — Encounter (INDEPENDENT_AMBULATORY_CARE_PROVIDER_SITE_OTHER): Payer: Self-pay

## 2013-06-01 DIAGNOSIS — Q619 Cystic kidney disease, unspecified: Secondary | ICD-10-CM | POA: Diagnosis not present

## 2013-06-01 DIAGNOSIS — R339 Retention of urine, unspecified: Secondary | ICD-10-CM | POA: Diagnosis not present

## 2013-06-01 DIAGNOSIS — I10 Essential (primary) hypertension: Secondary | ICD-10-CM | POA: Diagnosis not present

## 2013-06-01 DIAGNOSIS — Z01812 Encounter for preprocedural laboratory examination: Secondary | ICD-10-CM | POA: Diagnosis not present

## 2013-06-01 DIAGNOSIS — C672 Malignant neoplasm of lateral wall of bladder: Secondary | ICD-10-CM | POA: Diagnosis not present

## 2013-06-01 DIAGNOSIS — Z79899 Other long term (current) drug therapy: Secondary | ICD-10-CM | POA: Diagnosis not present

## 2013-06-01 DIAGNOSIS — N401 Enlarged prostate with lower urinary tract symptoms: Secondary | ICD-10-CM | POA: Diagnosis not present

## 2013-06-01 DIAGNOSIS — N1330 Unspecified hydronephrosis: Secondary | ICD-10-CM | POA: Diagnosis not present

## 2013-06-01 DIAGNOSIS — F0390 Unspecified dementia without behavioral disturbance: Secondary | ICD-10-CM | POA: Diagnosis not present

## 2013-06-01 DIAGNOSIS — R29898 Other symptoms and signs involving the musculoskeletal system: Secondary | ICD-10-CM | POA: Diagnosis not present

## 2013-06-01 DIAGNOSIS — R3129 Other microscopic hematuria: Secondary | ICD-10-CM | POA: Diagnosis not present

## 2013-06-01 DIAGNOSIS — E785 Hyperlipidemia, unspecified: Secondary | ICD-10-CM | POA: Diagnosis not present

## 2013-06-01 DIAGNOSIS — N500 Atrophy of testis: Secondary | ICD-10-CM | POA: Diagnosis not present

## 2013-06-01 DIAGNOSIS — R350 Frequency of micturition: Secondary | ICD-10-CM | POA: Diagnosis not present

## 2013-06-01 DIAGNOSIS — H409 Unspecified glaucoma: Secondary | ICD-10-CM | POA: Diagnosis not present

## 2013-06-01 DIAGNOSIS — Z954 Presence of other heart-valve replacement: Secondary | ICD-10-CM | POA: Diagnosis not present

## 2013-06-01 DIAGNOSIS — N139 Obstructive and reflux uropathy, unspecified: Secondary | ICD-10-CM | POA: Diagnosis not present

## 2013-06-01 DIAGNOSIS — N529 Male erectile dysfunction, unspecified: Secondary | ICD-10-CM | POA: Diagnosis not present

## 2013-06-01 DIAGNOSIS — K219 Gastro-esophageal reflux disease without esophagitis: Secondary | ICD-10-CM | POA: Diagnosis not present

## 2013-06-01 HISTORY — DX: Cardiac murmur, unspecified: R01.1

## 2013-06-01 HISTORY — DX: Unspecified osteoarthritis, unspecified site: M19.90

## 2013-06-01 HISTORY — DX: Adverse effect of unspecified anesthetic, initial encounter: T41.45XA

## 2013-06-01 HISTORY — DX: Other complications of anesthesia, initial encounter: T88.59XA

## 2013-06-01 HISTORY — DX: Malignant (primary) neoplasm, unspecified: C80.1

## 2013-06-01 HISTORY — DX: Other abnormalities of gait and mobility: R26.89

## 2013-06-01 LAB — CBC
Hemoglobin: 11.4 g/dL — ABNORMAL LOW (ref 13.0–17.0)
MCH: 30.9 pg (ref 26.0–34.0)
MCHC: 32.8 g/dL (ref 30.0–36.0)

## 2013-06-01 LAB — BASIC METABOLIC PANEL
Calcium: 10 mg/dL (ref 8.4–10.5)
Creatinine, Ser: 1.27 mg/dL (ref 0.50–1.35)
GFR calc non Af Amer: 48 mL/min — ABNORMAL LOW (ref >90)
Glucose, Bld: 92 mg/dL (ref 70–99)
Sodium: 134 mEq/L — ABNORMAL LOW (ref 135–145)

## 2013-06-01 NOTE — Patient Instructions (Signed)
YOUR SURGERY IS SCHEDULED AT [facility]  ON:   Thursday  10/30  REPORT TO [facility] AT:  10:00 AM      PHONE # FOR SHORT STAY IS [phone]  DO NOT EAT ANYTHING AFTER MIDNIGHT THE NIGHT BEFORE YOUR SURGERY.   NO FOOD, NO CHEWING GUM, NO MINTS, NO CANDIES, NO CHEWING TOBACCO. YOU MAY HAVE CLEAR LIQUIDS TO DRINK FROM MIDNIGHT UNTIL 6:00 AM MORNING OF SURGERY - LIKE WATER.   NOTHING TO DRINK AFTER 6:00 AM DAY OF SURGERY.  PLEASE TAKE THE FOLLOWING MEDICATIONS THE AM OF YOUR SURGERY WITH A FEW SIPS OF WATER:  MAY USE YOUR EYE DROPS    DO NOT BRING VALUABLES, MONEY, CREDIT CARDS.  DO NOT WEAR JEWELRY, MAKE-UP, NAIL POLISH AND NO METAL PINS OR CLIPS IN YOUR HAIR. CONTACT LENS, DENTURES / PARTIALS, GLASSES SHOULD NOT BE WORN TO SURGERY AND IN MOST CASES-HEARING AIDS WILL NEED TO BE REMOVED.  BRING YOUR GLASSES CASE, ANY EQUIPMENT NEEDED FOR YOUR CONTACT LENS. FOR PATIENTS ADMITTED TO THE HOSPITAL--CHECK OUT TIME THE DAY OF DISCHARGE IS 11:00 AM.  ALL INPATIENT ROOMS ARE PRIVATE - WITH BATHROOM, TELEPHONE, TELEVISION AND WIFI INTERNET.   FAILURE TO FOLLOW THESE INSTRUCTIONS MAY RESULT IN THE CANCELLATION OF YOUR SURGERY.   PATIENT SIGNATURE_________________________________

## 2013-06-01 NOTE — Pre-Procedure Instructions (Signed)
CXR WITH RIB XRAY REPORT IN EPIC FROM 07/20/12. EKG, CARDIOLOGY OFFICE NOTE IN EPIC FROM DR. NAHSER  03/02/13

## 2013-06-01 NOTE — Progress Notes (Signed)
06/01/13 1330  OBSTRUCTIVE SLEEP APNEA  Have you ever been diagnosed with sleep apnea through a sleep study? No  Do you snore loudly (loud enough to be heard through closed doors)?  0  Do you often feel tired, fatigued, or sleepy during the daytime? 1  Has anyone observed you stop breathing during your sleep? 0  Do you have, or are you being treated for high blood pressure? 1  BMI more than 35 kg/m2? 0  Age over 77 years old? 1  Neck circumference greater than 40 cm/18 inches? 0  Gender: 1  Obstructive Sleep Apnea Score 4  Score 4 or greater  Results sent to PCP

## 2013-06-03 NOTE — H&P (Signed)
ctive Problems Problems  1. Benign Prostatic Hypertrophy Without Urinary Obstruction 600.00 2. Bladder Cancer 188.9 3. Cystic Kidney Disease 753.10 4. Malignant Neoplasm Of The Trigone Of The Bladder 188.0 5. Microscopic Hematuria 599.72 6. Organic Impotence 607.84 7. Prostate Hard Area Or Nodule 600.10 8. Pyuria 791.9 9. Testicular Atrophy 608.3 10. Urinary Frequency 788.41  History of Present Illness  Mr. Bryan Moore returns today in f/u.   He has a history of T1 HG NMIBC and left ureteral obstruction with a recent TURBT and  left stent.  He had some postop retention and has a large obstructing prostate.  He has been able to stop the rapaflo.  He had the stent removed on about 3 weeks ago and he has done well with no flank pain or hematuria.  A renal US today shows no left side hydro and he is emptying his bladder well.   Past Medical History Problems  1. History of  Acute Urinary Retention 788.20 2. History of  Glaucoma 365.9 3. History of  Herpes Zoster (Shingles) 053.9 4. History of  Hydronephrosis On The Left 591 5. History of  Hypertension 401.9 6. History of  Incomplete Emptying Of Bladder 788.21 7. History of  PSA,Elevated 790.93  Surgical History Problems  1. History of  Aortic Valve Replacement 2. History of  Appendectomy 3. History of  Back Surgery 4. History of  Cystoscopy With Fulguration Large Lesion (Over 5cm) 5. History of  Cystoscopy With Insertion Of Ureteral Stent Left  Current Meds 1. Alphagan P 0.1 % Ophthalmic Solution; Therapy: [id number] to 2. Brimonidine Tartrate 0.15 % Ophthalmic Solution; Therapy: [id number] to 3. Dorzolamide HCl-Timolol Mal 22.3-6.8 MG/ML Ophthalmic Solution; Therapy: [id number] to 4. Rapaflo 8 MG Oral Capsule; TAKE 1 CAPSULE Daily; Therapy: 12Sep2014 to  (Evaluate:19Sep2014); Last Rx:12Sep2014  Allergies Medication  1. Penicillins  Family History Problems  1. Family history of  Family Health Status Number Of Children 1 son; 1  daughter  Social History Problems  1. Alcohol Use Occ glass of wine. 2. Caffeine Use drinks coffee 3. Former Smoker V15.82 4. Marital History - Currently Married 5. Occupation: retired Denied  6. Tobacco Use  Review of Systems  Genitourinary: no hematuria.  Gastrointestinal: no flank pain.  Constitutional: no fever.    Vitals Vital Signs [Data Includes: Last 1 Day]  15Oct2014 03:14PM  Blood Pressure: 129 / 54 Temperature: 97.7 F Heart Rate: 63  Physical Exam Constitutional: Well nourished and well developed . No acute distress.  Neck: The appearance of the neck is normal and no neck mass is present.  Pulmonary: No respiratory distress and normal respiratory rhythm and effort.  Cardiovascular: Heart rate and rhythm are normal . No peripheral edema.  Abdomen: The abdomen is soft and nontender. No masses are palpated. No CVA tenderness. No hernias are palpable. No hepatosplenomegaly noted.    Results/Data Urine [Data Includes: Last 1 Day]   [id number]  COLOR YELLOW   APPEARANCE CLEAR   SPECIFIC GRAVITY 1.020   pH 6.0   GLUCOSE NEG mg/dL  BILIRUBIN NEG   KETONE NEG mg/dL  BLOOD MOD   PROTEIN 30 mg/dL  UROBILINOGEN 0.2 mg/dL  NITRITE NEG   LEUKOCYTE ESTERASE MOD   SQUAMOUS EPITHELIAL/HPF RARE   WBC 21-50 WBC/hpf  RBC 11-20 RBC/hpf  BACTERIA FEW   CRYSTALS NONE SEEN   CASTS NONE SEEN    The following images/tracing/specimen were independently visualized:  left renal US shows no hydro. there are simple renal cysts on the LUP and LLP. The  bladder PVR is only 18cc. See study sheet for measurements.  The following clinical lab reports were reviewed:  UA reviewed.    Assessment Assessed  1. Bladder Cancer 188.9 2. Hydronephrosis On The Left 591 3. Pyuria 791.9 4. Microscopic Hematuria 599.72 5. Benign Prostatic Hypertrophy Without Urinary Obstruction 600.00 6. Incomplete Emptying Of Bladder 788.21   He is doing well voiding to completion and he has  hydronephrosis. He has some pyuria and microhematuria as expected.   Plan Bladder Cancer (188.9)  1. Follow-up Schedule Surgery Office  Follow-up  Requested for: 15Oct2014 Health Maintenance (V70.0)  2. UA With REFLEX  Done: [id number] 02:41PM PMH: Incomplete Emptying Of Bladder (788.21)  3. Rapaflo 8 MG Oral Capsule; TAKE 1 CAPSULE Daily; Therapy: [id number] to [phone]; Last  Rx:12Sep2014; Status: DISCONTINUED Pyuria (791.9)  4. URINE CULTURE  Requested for: 15Oct2014   Urine culture today. I discussed the options for treatment including restaging TURBT and the eventual need for BCG. He is agreeable to the restaging procedure and I reviewed the risks including the possible need for a stent. I will have him resume Rapaflo prior to the procedure.   Discussion/Summary  CC: Dr. Jeneen Rinks [name].

## 2013-06-04 ENCOUNTER — Observation Stay (HOSPITAL_COMMUNITY)
Admission: RE | Admit: 2013-06-04 | Discharge: 2013-06-05 | Disposition: A | Discharge status: Home / Self Care | Payer: Medicare Other | Source: Ambulatory Visit | Attending: Urology | Admitting: Urology

## 2013-06-04 ENCOUNTER — Encounter (HOSPITAL_COMMUNITY): Payer: Self-pay | Admitting: *Deleted

## 2013-06-04 ENCOUNTER — Ambulatory Visit (HOSPITAL_COMMUNITY): Payer: Medicare Other | Admitting: Anesthesiology

## 2013-06-04 ENCOUNTER — Encounter (HOSPITAL_COMMUNITY): Payer: Medicare Other | Admitting: Anesthesiology

## 2013-06-04 ENCOUNTER — Encounter (HOSPITAL_COMMUNITY)
Admission: RE | Disposition: A | Discharge status: Home / Self Care | Payer: Self-pay | Source: Ambulatory Visit | Attending: Urology

## 2013-06-04 DIAGNOSIS — N529 Male erectile dysfunction, unspecified: Secondary | ICD-10-CM | POA: Insufficient documentation

## 2013-06-04 DIAGNOSIS — R3129 Other microscopic hematuria: Secondary | ICD-10-CM | POA: Insufficient documentation

## 2013-06-04 DIAGNOSIS — N135 Crossing vessel and stricture of ureter without hydronephrosis: Secondary | ICD-10-CM | POA: Diagnosis not present

## 2013-06-04 DIAGNOSIS — I10 Essential (primary) hypertension: Secondary | ICD-10-CM | POA: Insufficient documentation

## 2013-06-04 DIAGNOSIS — C679 Malignant neoplasm of bladder, unspecified: Secondary | ICD-10-CM | POA: Diagnosis present

## 2013-06-04 DIAGNOSIS — Q619 Cystic kidney disease, unspecified: Secondary | ICD-10-CM | POA: Insufficient documentation

## 2013-06-04 DIAGNOSIS — N1330 Unspecified hydronephrosis: Secondary | ICD-10-CM | POA: Insufficient documentation

## 2013-06-04 DIAGNOSIS — N139 Obstructive and reflux uropathy, unspecified: Secondary | ICD-10-CM | POA: Insufficient documentation

## 2013-06-04 DIAGNOSIS — Z954 Presence of other heart-valve replacement: Secondary | ICD-10-CM | POA: Insufficient documentation

## 2013-06-04 DIAGNOSIS — R339 Retention of urine, unspecified: Secondary | ICD-10-CM | POA: Insufficient documentation

## 2013-06-04 DIAGNOSIS — E785 Hyperlipidemia, unspecified: Secondary | ICD-10-CM | POA: Insufficient documentation

## 2013-06-04 DIAGNOSIS — R350 Frequency of micturition: Secondary | ICD-10-CM | POA: Insufficient documentation

## 2013-06-04 DIAGNOSIS — C672 Malignant neoplasm of lateral wall of bladder: Secondary | ICD-10-CM | POA: Diagnosis not present

## 2013-06-04 DIAGNOSIS — N401 Enlarged prostate with lower urinary tract symptoms: Secondary | ICD-10-CM | POA: Insufficient documentation

## 2013-06-04 DIAGNOSIS — Z79899 Other long term (current) drug therapy: Secondary | ICD-10-CM | POA: Insufficient documentation

## 2013-06-04 DIAGNOSIS — R29898 Other symptoms and signs involving the musculoskeletal system: Secondary | ICD-10-CM | POA: Insufficient documentation

## 2013-06-04 DIAGNOSIS — F0390 Unspecified dementia without behavioral disturbance: Secondary | ICD-10-CM | POA: Insufficient documentation

## 2013-06-04 DIAGNOSIS — H409 Unspecified glaucoma: Secondary | ICD-10-CM | POA: Insufficient documentation

## 2013-06-04 DIAGNOSIS — Z01812 Encounter for preprocedural laboratory examination: Secondary | ICD-10-CM | POA: Insufficient documentation

## 2013-06-04 DIAGNOSIS — N138 Other obstructive and reflux uropathy: Secondary | ICD-10-CM | POA: Insufficient documentation

## 2013-06-04 DIAGNOSIS — K219 Gastro-esophageal reflux disease without esophagitis: Secondary | ICD-10-CM | POA: Insufficient documentation

## 2013-06-04 DIAGNOSIS — N500 Atrophy of testis: Secondary | ICD-10-CM | POA: Insufficient documentation

## 2013-06-04 HISTORY — PX: TRANSURETHRAL RESECTION OF BLADDER TUMOR: SHX2575

## 2013-06-04 SURGERY — TURBT (TRANSURETHRAL RESECTION OF BLADDER TUMOR)
Anesthesia: General | Wound class: Clean Contaminated

## 2013-06-04 MED ORDER — MEPERIDINE HCL 50 MG/ML IJ SOLN: 6.2500 mg | INTRAMUSCULAR | Status: DC | PRN

## 2013-06-04 MED ORDER — SODIUM CHLORIDE 0.9 % IR SOLN
Status: DC | PRN
  Administered 2013-06-04: 6000 mL via INTRAVESICAL

## 2013-06-04 MED ORDER — HYDROMORPHONE HCL PF 1 MG/ML IJ SOLN: 0.5000 mg | INTRAMUSCULAR | Status: DC | PRN

## 2013-06-04 MED ORDER — DIPHENHYDRAMINE HCL 50 MG/ML IJ SOLN: 12.5000 mg | Freq: Four times a day (QID) | INTRAMUSCULAR | Status: DC | PRN

## 2013-06-04 MED ORDER — CYANOCOBALAMIN 250 MCG PO TABS
250.0000 ug | ORAL_TABLET | Freq: Every morning | ORAL | Status: DC
  Administered 2013-06-04 – 2013-06-05 (×2): 250 ug via ORAL
  Filled 2013-06-04 (×2): qty 1

## 2013-06-04 MED ORDER — HYOSCYAMINE SULFATE 0.125 MG SL SUBL
0.1250 mg | SUBLINGUAL_TABLET | SUBLINGUAL | Status: DC | PRN
  Filled 2013-06-04: qty 1

## 2013-06-04 MED ORDER — POLYSACCHARIDE IRON COMPLEX 150 MG PO CAPS
150.0000 mg | ORAL_CAPSULE | Freq: Every morning | ORAL | Status: DC
  Administered 2013-06-04 – 2013-06-05 (×2): 150 mg via ORAL
  Filled 2013-06-04 (×2): qty 1

## 2013-06-04 MED ORDER — DIPHENHYDRAMINE HCL 12.5 MG/5ML PO ELIX: 12.5000 mg | ORAL_SOLUTION | Freq: Four times a day (QID) | ORAL | Status: DC | PRN

## 2013-06-04 MED ORDER — ADULT MULTIVITAMIN W/MINERALS CH
1.0000 | ORAL_TABLET | Freq: Every morning | ORAL | Status: DC
  Administered 2013-06-04 – 2013-06-05 (×2): 1 via ORAL
  Filled 2013-06-04 (×2): qty 1

## 2013-06-04 MED ORDER — DEXAMETHASONE SODIUM PHOSPHATE 10 MG/ML IJ SOLN
INTRAMUSCULAR | Status: DC | PRN
  Administered 2013-06-04: 10 mg via INTRAVENOUS

## 2013-06-04 MED ORDER — BISACODYL 10 MG RE SUPP: 10.0000 mg | Freq: Every day | RECTAL | Status: DC | PRN

## 2013-06-04 MED ORDER — CIPROFLOXACIN HCL 250 MG PO TABS
250.0000 mg | ORAL_TABLET | Freq: Two times a day (BID) | ORAL | Status: DC
  Administered 2013-06-04 – 2013-06-05 (×2): 250 mg via ORAL
  Filled 2013-06-04 (×5): qty 1

## 2013-06-04 MED ORDER — DORZOLAMIDE HCL-TIMOLOL MAL 2-0.5 % OP SOLN
1.0000 [drp] | Freq: Two times a day (BID) | OPHTHALMIC | Status: DC
  Administered 2013-06-04 – 2013-06-05 (×2): 1 [drp] via OPHTHALMIC
  Filled 2013-06-04: qty 10

## 2013-06-04 MED ORDER — CIPROFLOXACIN IN D5W 200 MG/100ML IV SOLN
200.0000 mg | INTRAVENOUS | Status: AC
  Administered 2013-06-04: 200 mg via INTRAVENOUS
  Filled 2013-06-04: qty 100

## 2013-06-04 MED ORDER — PROMETHAZINE HCL 25 MG/ML IJ SOLN: 6.2500 mg | INTRAMUSCULAR | Status: DC | PRN

## 2013-06-04 MED ORDER — TAMSULOSIN HCL 0.4 MG PO CAPS
0.4000 mg | ORAL_CAPSULE | Freq: Every day | ORAL | Status: DC
  Administered 2013-06-04: 0.4 mg via ORAL
  Filled 2013-06-04 (×2): qty 1

## 2013-06-04 MED ORDER — FENTANYL CITRATE 0.05 MG/ML IJ SOLN: 25.0000 ug | INTRAMUSCULAR | Status: DC | PRN

## 2013-06-04 MED ORDER — ZOLPIDEM TARTRATE 5 MG PO TABS
5.0000 mg | ORAL_TABLET | Freq: Every evening | ORAL | Status: DC | PRN
  Administered 2013-06-04: 5 mg via ORAL
  Filled 2013-06-04: qty 1

## 2013-06-04 MED ORDER — LATANOPROST 0.005 % OP SOLN
1.0000 [drp] | Freq: Every day | OPHTHALMIC | Status: DC
  Administered 2013-06-04: 21:00:00 1 [drp] via OPHTHALMIC
  Filled 2013-06-04: qty 2.5

## 2013-06-04 MED ORDER — LIDOCAINE HCL 2 % EX GEL
CUTANEOUS | Status: AC
  Filled 2013-06-04: qty 10

## 2013-06-04 MED ORDER — 0.9 % SODIUM CHLORIDE (POUR BTL) OPTIME
TOPICAL | Status: DC | PRN
  Administered 2013-06-04: 1000 mL

## 2013-06-04 MED ORDER — ONDANSETRON HCL 4 MG/2ML IJ SOLN
INTRAMUSCULAR | Status: DC | PRN
  Administered 2013-06-04: 4 mg via INTRAVENOUS

## 2013-06-04 MED ORDER — VITAMIN D3 25 MCG (1000 UNIT) PO TABS
1000.0000 [IU] | ORAL_TABLET | Freq: Every morning | ORAL | Status: DC
  Administered 2013-06-04 – 2013-06-05 (×2): 1000 [IU] via ORAL
  Filled 2013-06-04 (×2): qty 1

## 2013-06-04 MED ORDER — HYDROCODONE-ACETAMINOPHEN 5-325 MG PO TABS: 1.0000 | ORAL_TABLET | ORAL | Status: DC | PRN

## 2013-06-04 MED ORDER — KCL IN DEXTROSE-NACL 20-5-0.45 MEQ/L-%-% IV SOLN
INTRAVENOUS | Status: DC
  Administered 2013-06-04: 17:00:00 via INTRAVENOUS
  Filled 2013-06-04 (×3): qty 1000

## 2013-06-04 MED ORDER — ONDANSETRON HCL 4 MG/2ML IJ SOLN: 4.0000 mg | INTRAMUSCULAR | Status: DC | PRN

## 2013-06-04 MED ORDER — ACETAMINOPHEN 325 MG PO TABS: 650.0000 mg | ORAL_TABLET | ORAL | Status: DC | PRN

## 2013-06-04 MED ORDER — LACTATED RINGERS IV SOLN
INTRAVENOUS | Status: DC | PRN
  Administered 2013-06-04: 12:00:00 via INTRAVENOUS

## 2013-06-04 MED ORDER — PROPOFOL 10 MG/ML IV BOLUS
INTRAVENOUS | Status: DC | PRN
  Administered 2013-06-04: 120 mg via INTRAVENOUS

## 2013-06-04 MED ORDER — FENTANYL CITRATE 0.05 MG/ML IJ SOLN
INTRAMUSCULAR | Status: DC | PRN
  Administered 2013-06-04 (×4): 25 ug via INTRAVENOUS

## 2013-06-04 MED ORDER — LORAZEPAM 0.5 MG PO TABS
0.5000 mg | ORAL_TABLET | Freq: Four times a day (QID) | ORAL | Status: DC | PRN
  Administered 2013-06-04: 0.5 mg via ORAL
  Filled 2013-06-04: qty 1

## 2013-06-04 MED ORDER — BRIMONIDINE TARTRATE 0.15 % OP SOLN
1.0000 [drp] | Freq: Three times a day (TID) | OPHTHALMIC | Status: DC
  Administered 2013-06-04 – 2013-06-05 (×2): 1 [drp] via OPHTHALMIC
  Filled 2013-06-04: qty 5

## 2013-06-04 SURGICAL SUPPLY — 22 items
BAG URINE DRAINAGE (UROLOGICAL SUPPLIES) ×1 IMPLANT
BAG URO CATCHER STRL LF (DRAPE) ×2 IMPLANT
CATH FOLEY 2WAY SLVR  5CC 20FR (CATHETERS) ×1
CATH FOLEY 2WAY SLVR 5CC 20FR (CATHETERS) IMPLANT
CATH FOLEY 3WAY 30CC 22FR (CATHETERS) IMPLANT
CATH URET 5FR 28IN OPEN ENDED (CATHETERS) ×1 IMPLANT
CLOTH BEACON ORANGE TIMEOUT ST (SAFETY) ×2 IMPLANT
DRAPE CAMERA CLOSED 9X96 (DRAPES) ×2 IMPLANT
ELECT BUTTON HF 24-28F 2 30DE (ELECTRODE) ×1 IMPLANT
ELECT LOOP MED HF 24F 12D (CUTTING LOOP) ×1 IMPLANT
ELECT LOOP MED HF 24F 12D CBL (CLIP) ×2 IMPLANT
ELECT RESECT VAPORIZE 12D CBL (ELECTRODE) ×1 IMPLANT
GLOVE SURG SS PI 8.0 STRL IVOR (GLOVE) IMPLANT
GOWN STRL REIN XL XLG (GOWN DISPOSABLE) ×2 IMPLANT
HOLDER FOLEY CATH W/STRAP (MISCELLANEOUS) IMPLANT
KIT ASPIRATION TUBING (SET/KITS/TRAYS/PACK) ×1 IMPLANT
MANIFOLD NEPTUNE II (INSTRUMENTS) ×2 IMPLANT
PACK CYSTO (CUSTOM PROCEDURE TRAY) ×2 IMPLANT
SUT ETHILON 3 0 PS 1 (SUTURE) IMPLANT
SYR 30ML LL (SYRINGE) IMPLANT
SYRINGE IRR TOOMEY STRL 70CC (SYRINGE) IMPLANT
TUBING CONNECTING 10 (TUBING) ×2 IMPLANT

## 2013-06-04 NOTE — Progress Notes (Signed)
Patient ID: Bryan Moore, male   DOB: 02/04/1920, 79 y.o.   MRN: [id number] Pt doing well.  Urine clear.  Will send home with foley tomorrow if it stays clear.

## 2013-06-04 NOTE — Progress Notes (Signed)
Patient and family  instructed on importance of using incentive spirometer.  Patient demonstrated proper use and stated " I have two of those at home and I know how to use them."  Patient achieved 2500.

## 2013-06-04 NOTE — Anesthesia Preprocedure Evaluation (Signed)
Anesthesia Evaluation  Patient identified by MRN, date of birth, ID band Patient awake    Reviewed: Allergy & Precautions, H&P , NPO status , Patient's Chart, lab work & pertinent test results  Airway Mallampati: II TM Distance: >3 FB Neck ROM: Full    Dental no notable dental hx.    Pulmonary neg pulmonary ROS,  breath sounds clear to auscultation  Pulmonary exam normal       Cardiovascular hypertension, Pt. on medications Rhythm:Regular Rate:Normal  S/p TAVR 2012 for AS   Neuro/Psych Dementia  negative psych ROS   GI/Hepatic Neg liver ROS, GERD-  ,  Endo/Other  negative endocrine ROS  Renal/GU negative Renal ROS  negative genitourinary   Musculoskeletal negative musculoskeletal ROS (+)   Abdominal   Peds negative pediatric ROS (+)  Hematology negative hematology ROS (+)   Anesthesia Other Findings   Reproductive/Obstetrics negative OB ROS                           Anesthesia Physical  Anesthesia Plan  ASA: II  Anesthesia Plan: General   Post-op Pain Management:    Induction: Intravenous  Airway Management Planned: LMA  Additional Equipment:   Intra-op Plan:   Post-operative Plan: Extubation in OR  Informed Consent: I have reviewed the patients History and Physical, chart, labs and discussed the procedure including the risks, benefits and alternatives for the proposed anesthesia with the patient or authorized representative who has indicated his/her understanding and acceptance.   Dental advisory given  Plan Discussed with: CRNA  Anesthesia Plan Comments:         Anesthesia Quick Evaluation

## 2013-06-04 NOTE — Brief Op Note (Signed)
06/04/2013  12:58 PM  PATIENT:  Bryan Moore  79 y.o. male  PRE-OPERATIVE DIAGNOSIS:  Bladder Cancer  POST-OPERATIVE DIAGNOSIS:  Bladder Cancer  PROCEDURE:  Procedure(s) with comments: RESTAGING TRANSURETHRAL RESECTION OF BLADDER TUMOR 2-5cm (TURBT), URETERAL CATHERIZATION  (N/A) - CYSTO RESTAGING TURBT     SURGEON:  Surgeon(s) and Role:    * [name], MD - Primary  PHYSICIAN ASSISTANT:   ASSISTANTS: none   ANESTHESIA:   general  EBL:  Total I/O In: 400 [I.V.:400] Out: -   BLOOD ADMINISTERED:none  DRAINS: Urinary Catheter (Foley)   LOCAL MEDICATIONS USED:  NONE  SPECIMEN:  Source of Specimen:  chips from left lateral bladder and bladder neck.  DISPOSITION OF SPECIMEN:  PATHOLOGY  COUNTS:  YES  TOURNIQUET:  * No tourniquets in log *  DICTATION: .Other Dictation: Dictation Number [id number]  PLAN OF CARE: Admit for overnight observation  PATIENT DISPOSITION:  PACU - hemodynamically stable.   Delay start of Pharmacological VTE agent (>24hrs) due to surgical blood loss or risk of bleeding: yes

## 2013-06-04 NOTE — Interval H&P Note (Signed)
History and Physical Interval Note:  06/04/2013 11:55 AM  Bryan Moore  has presented today for surgery, with the diagnosis of Bladder Cancer  The various methods of treatment have been discussed with the patient and family. After consideration of risks, benefits and other options for treatment, the patient has consented to  Procedure(s) with comments: RESTAGING TRANSURETHRAL RESECTION OF BLADDER TUMOR (TURBT)    (N/A) - CYSTO RESTAGING TURBT POSS STENT PLACEMENT   CYSTOSCOPY WITH POSSIBLE STENT PLACEMENT (N/A) as a surgical intervention .  The patient's history has been reviewed, patient examined, no change in status, stable for surgery.  I have reviewed the patient's chart and labs.  Questions were answered to the patient's satisfaction.     Aliegha Paullin J

## 2013-06-04 NOTE — Anesthesia Postprocedure Evaluation (Signed)
  Anesthesia Post-op Note  Patient: Bryan Moore  Procedure(s) Performed: Procedure(s) (LRB): RESTAGING TRANSURETHRAL RESECTION OF BLADDER TUMOR (TURBT), URETERAL CATHERIZATION  (N/A)  Patient Location: PACU  Anesthesia Type: General  Level of Consciousness: awake and alert   Airway and Oxygen Therapy: Patient Spontanous Breathing  Post-op Pain: mild  Post-op Assessment: Post-op Vital signs reviewed, Patient's Cardiovascular Status Stable, Respiratory Function Stable, Patent Airway and No signs of Nausea or vomiting  Last Vitals:  Filed Vitals:   06/04/13 1429  BP: 172/82  Pulse: 62  Temp: 36.6 C  Resp: 12    Post-op Vital Signs: stable   Complications: No apparent anesthesia complications

## 2013-06-04 NOTE — Preoperative (Signed)
Beta Blockers   Reason not to administer Beta Blockers:Not Applicable 

## 2013-06-04 NOTE — Care Management Note (Addendum)
    Page 1 of 1   06/05/2013     12:48:47 PM   CARE MANAGEMENT NOTE 06/05/2013  Patient:  Bryan Moore, Bryan Moore   Account Number:  [id number]  Date Initiated:  06/04/2013  Documentation initiated by:  Dessa Phi  Subjective/Objective Assessment:   79 Y/O M ADMITTED W/BPH,BLADDER CA.     Action/Plan:   FROM HOME W/SPOUSE.   Anticipated DC Date:  06/05/2013   Anticipated DC Plan:  [place]  CM consult      Choice offered to / List presented to:             Status of service:  Completed, signed off Medicare Important Message given?   (If response is "NO", the following Medicare IM given date fields will be blank) Date Medicare IM given:   Date Additional Medicare IM given:    Discharge Disposition:  HOME/SELF CARE  Per UR Regulation:  Reviewed for med. necessity/level of care/duration of stay  If discussed at [place] of Stay Meetings, dates discussed:    Comments:  06/05/13 Doralee Kocak RN,BSN NCM [phone] D/C HOME NO NEEDS.  06/04/13 Geetika Laborde RN,BSN NCM [phone] S/P TURBT.NO ANTICIPATED D/C NEEDS.

## 2013-06-04 NOTE — Transfer of Care (Signed)
Immediate Anesthesia Transfer of Care Note  Patient: Bryan Moore  Procedure(s) Performed: Procedure(s) with comments: RESTAGING TRANSURETHRAL RESECTION OF BLADDER TUMOR (TURBT), URETERAL CATHERIZATION  (N/A) - CYSTO RESTAGING TURBT     Patient Location: PACU  Anesthesia Type:General  Level of Consciousness: awake, oriented and patient cooperative  Airway & Oxygen Therapy: Patient Spontanous Breathing and Patient connected to face mask oxygen  Post-op Assessment: Report given to PACU RN and Post -op Vital signs reviewed and stable  Post vital signs: Reviewed and stable  Complications: No apparent anesthesia complications

## 2013-06-05 ENCOUNTER — Encounter (HOSPITAL_COMMUNITY): Payer: Self-pay | Admitting: Urology

## 2013-06-05 NOTE — Progress Notes (Signed)
Patient and wife educated about importance of wearing large foley bag at night and leaving the bag below the level of bladder so urine can drain by gravity.  Patient's wife stated " I am very familiar with the leg bag and big bag, he has had this done before."  Patient and wife prefer patient to wear large foley bag home and I gave patient a leg bag to take home.  Patient and wife verbalized understanding of discharge instructions and have no questions at this time.

## 2013-06-05 NOTE — Op Note (Signed)
NAME:  Bryan Moore, Bryan Moore NO.:  [id number]  MEDICAL RECORD NO.:  [id number]  LOCATION:  9                         FACILITY:  [facility]  PHYSICIAN:  Marshall Cork. [name], M.D.    DATE OF BIRTH:  [birth year]  DATE OF PROCEDURE:  06/04/2013 DATE OF DISCHARGE:                              OPERATIVE REPORT   PROCEDURES:  Cystoscopy, left ureteral catheterization, restaging transurethral resection of the 2-5 cm bladder tumor.  PREOPERATIVE DIAGNOSIS:  High-grade T1 non-small cell invasive bladder cancer with prior ureteral involvement.  POSTOPERATIVE DIAGNOSIS:  High-grade T1 non-small cell invasive bladder cancer with prior ureteral involvement.  SURGEON:  Marshall Cork. [name], M.D.  ANESTHESIA:  General.  SPECIMEN:  Chips from the left lateral wall, tumor and resection bed and from the left bladder neck and prostate prior resection bed.  DRAINS:  A 22-French Foley catheter.  BLOOD LOSS:  Minimal.  COMPLICATIONS:  None.  INDICATIONS:  [name] is a 79 year old white male, who underwent TURBT in September and was found to have a high-grade T1 non-small cell invasive bladder cancer that involved the left ureteral orifice.  The orifice required resection and the stent was left for approximately 2 weeks.  Following removal of the stent, ultrasounds revealed no hydronephrosis.  He has agreed to restaging as per standard of care.  FINDINGS OF PROCEDURE:  He was given Cipro.  He was taken to the operating room where general anesthetic was induced.  He was placed in lithotomy position.  His perineum and genitalia were prepped with Betadine solution and he was draped in usual sterile fashion. Cystoscopy was performed using the 22-French scope and 12-degree lens. Examination revealed the normal urethra.  The external sphincter was intact.  The prostatic urethra was approximately 4-5 cm in length with bilobar hyperplasia with coaptation and obstruction.  There was evidence of  prior resection at the left bladder neck and prostate.  Inspection of the bladder revealed mild trabeculation.  The right ureteral orifice had some mild inflammation from the cauterization that was performed around it.  The left ureteral orifice was widemouthed from the prior resection, but quite tightened.  Superior lateral to the orifice was the prior resection bed with some inflammatory changes, but more lateral to that was patchy residual papillary-appearing tumor.  The mucosa on the right lateral wall and trigone had some cobblestoning and it was difficult to know if this is persistent/recurrent tumor or just postoperative effect.  Once the thorough inspection had been performed, a 5-French open-end catheter was passed up the left ureteral orifice to ensure patency, it went easily and was removed.  The urethra was then calibrated to 30-French with Leander Rams sounds and a 28-French continuous flow resectoscope sheath was inserted.  This was fitted with an Beatrix Fetters handle with a bipolar loop and 12-degree lens. Saline was used as the irrigant.  The area on the left lateral wall with the recurrent tumor fronds in the resection bed was then resected down to muscular fibers, no obturator reflex was noted.  The area of resection was approximately 3-4 cm.  Once this area had been resected and the periphery fulgurated, the specimen was removed.  I then fulgurated  some abnormal mucosa on the right trigone and lateral wall, and following this, the resection bed of the left bladder neck was resected, this was also to help open the bladder neck slightly as the patient has had prior postoperative urinary retention and there was a small flap of tissue in this area that may have contributed.  Once this resection had been completed, the specimen was removed and hemostasis was assured.  Final inspection revealed no obvious residual tumor and no bleeding from the resection sites.  The right  ureteral orifice remained intact as was the left ureteral orifice, and it is new, slightly ectopic, but wide-mouth position.  The resectoscope was removed and a 20-French Foley catheter was inserted, then the balloon was filled with 10 mL of sterile fluid.  The catheter was irrigated with clear return and placed to straight drainage.  The patient was taken down from lithotomy position.  His anesthetic was reversed.  He was moved to the recovery room in stable condition.  I elected not to give mitomycin-C as he has probable reflux on the left.  There were no complications.     Marshall Cork. [name], M.D.     JJW/MEDQ  D:  06/04/2013  T:  06/05/2013  Job:  [id number]

## 2013-06-05 NOTE — Discharge Summary (Signed)
Physician Discharge Summary  Patient ID: Bryan Moore MRN: [id number] DOB/AGE: 11-19-1919 79 y.o.  Admit date: 06/04/2013 Discharge date: 06/05/2013  Admission Diagnoses:  Bladder cancer  Discharge Diagnoses:  Principal Problem:   Bladder cancer   Past Medical History  Diagnosis Date  . Anemia     from AVM's. Requiring transfusion  . Hyperlipemia   . Aortic stenosis     s/p TAVI July 2012  . Dementia   . Glaucoma   . Mitral regurgitation   . Shingles May 2013    right arm residual pain- last outbreak 1 1/2 yrs ago.  Marland Kitchen Hypertension     Not taking Lisinopril due to BP drops low.  . Transfusion history     last 2 yrs ago.  Marland Kitchen GERD (gastroesophageal reflux disease)     RARE - AND NO MEDS  . Cancer     BLADDER CANCER  . Arthritis   . Balance problem     WEAK ON RIGHT SIDE ( SCOLISOSIS AND HX OF A BACK SURGERY) - BALANCE PROBLEM - "FALLS BACKWARD" - FREQUENT FALLS.  USES CANE WHEN AMBULATING  . Complication of anesthesia     VERY DROWSY FOR HOURS AFTER SURGERY - AND DISORIENTATION  . Heart murmur     PT'S CARDIOLOGIST IS DR. Acie Fredrickson -LAST OFFICE VISIT 03/02/13 IN EP[facility]    Surgeries: Procedure(s): RESTAGING TRANSURETHRAL RESECTION OF BLADDER TUMOR (TURBT), URETERAL CATHERIZATION  on 06/04/2013   Consultants (if any):    Discharged Condition: Improved  Hospital Course: [name] is an 79 y.o. male who was admitted 06/04/2013 with a diagnosis of Bladder cancer and went to the operating room on 06/04/2013 and underwent the above named procedures.  He has T1 HGNMIBC with left ureteral obstruction and had a restaging TURBT.  There was residual cancer but the left ureter was no longer obstructed.  His UA this morning is pink without clots and he is ok for discharge with a leg bag.   He will see me Monday for foley removal.   His BP was low this morning but he was assymptomatic and not tachycardic.  This will be checked prior to discharge.   He was given perioperative  antibiotics:  Anti-infectives   Start     Dose/Rate Route Frequency Ordered Stop   06/04/13 2000  ciprofloxacin (CIPRO) tablet 250 mg     250 mg Oral 2 times daily 06/04/13 1446     06/04/13 1013  ciprofloxacin (CIPRO) IVPB 200 mg     200 mg 100 mL/hr over 60 Minutes Intravenous 60 min pre-op 06/04/13 1013 06/04/13 1228    .  He was given sequential compression devices for DVT prophylaxis.  He benefited maximally from the hospital stay and there were no complications.    Recent vital signs:  Filed Vitals:   06/05/13 0542  BP: 94/42  Pulse: 70  Temp: 97.7 F (36.5 C)  Resp: 12    Recent laboratory studies:  Lab Results  Component Value Date   HGB 11.4* 06/01/2013   HGB 11.5* 04/08/2013   HGB 12.4* 01/19/2013   Lab Results  Component Value Date   WBC 4.5 06/01/2013   PLT 198 06/01/2013   Lab Results  Component Value Date   INR 1.07 04/03/2011   Lab Results  Component Value Date   NA 134* 06/01/2013   K 4.7 06/01/2013   CL 99 06/01/2013   CO2 25 06/01/2013   BUN 20 06/01/2013   CREATININE 1.27 06/01/2013  GLUCOSE 92 06/01/2013    Discharge Medications:     Medication List         acetaminophen 325 MG tablet  Commonly known as:  TYLENOL  Take 650 mg by mouth every 6 (six) hours as needed for pain. For pain     brimonidine 0.1 % Soln  Commonly known as:  ALPHAGAN P  Place 1 drop into both eyes 2 (two) times daily.     cholecalciferol 1000 UNITS tablet  Commonly known as:  VITAMIN D  Take 1,000 Units by mouth every morning.     dorzolamide-timolol 22.3-6.8 MG/ML ophthalmic solution  Commonly known as:  COSOPT  Place 1 drop into both eyes every 12 (twelve) hours.     iron polysaccharides 150 MG capsule  Commonly known as:  NIFEREX  Take 150 mg by mouth every morning.     latanoprost 0.005 % ophthalmic solution  Commonly known as:  XALATAN  Place 1 drop into both eyes at bedtime.     multivitamin with minerals Tabs tablet  Take 1 tablet by mouth  every morning.     silodosin 8 MG Caps capsule  Commonly known as:  RAPAFLO  Take 8 mg by mouth every evening.     vitamin B-12 250 MCG tablet  Commonly known as:  CYANOCOBALAMIN  Take 250 mcg by mouth every morning.        Diagnostic Studies: No results found.  Disposition: 01-Home or Self Care      Discharge Orders   Future Orders Complete By Expires   Urinary leg bag  As directed       Follow-up Information   Follow up with [name], MD On 06/11/2013. (130.   Also please come to the office Monday morning early for catheter removal. )    Specialty:  Urology   Contact information:   [address] [place] [address] [phone]        Signed: [name] 06/05/2013, 7:06 AM

## 2013-06-08 DIAGNOSIS — N400 Enlarged prostate without lower urinary tract symptoms: Secondary | ICD-10-CM | POA: Diagnosis not present

## 2013-06-08 DIAGNOSIS — C670 Malignant neoplasm of trigone of bladder: Secondary | ICD-10-CM | POA: Diagnosis not present

## 2013-06-22 DIAGNOSIS — C672 Malignant neoplasm of lateral wall of bladder: Secondary | ICD-10-CM | POA: Diagnosis not present

## 2013-06-22 DIAGNOSIS — R82998 Other abnormal findings in urine: Secondary | ICD-10-CM | POA: Diagnosis not present

## 2013-06-22 DIAGNOSIS — N400 Enlarged prostate without lower urinary tract symptoms: Secondary | ICD-10-CM | POA: Diagnosis not present

## 2013-07-21 DIAGNOSIS — H4011X0 Primary open-angle glaucoma, stage unspecified: Secondary | ICD-10-CM | POA: Diagnosis not present

## 2013-07-21 DIAGNOSIS — D500 Iron deficiency anemia secondary to blood loss (chronic): Secondary | ICD-10-CM | POA: Diagnosis not present

## 2013-07-21 DIAGNOSIS — H409 Unspecified glaucoma: Secondary | ICD-10-CM | POA: Diagnosis not present

## 2013-08-05 ENCOUNTER — Other Ambulatory Visit: Payer: Self-pay | Admitting: Dermatology

## 2013-08-05 DIAGNOSIS — L570 Actinic keratosis: Secondary | ICD-10-CM | POA: Diagnosis not present

## 2013-08-05 DIAGNOSIS — D485 Neoplasm of uncertain behavior of skin: Secondary | ICD-10-CM | POA: Diagnosis not present

## 2013-08-08 DIAGNOSIS — M19019 Primary osteoarthritis, unspecified shoulder: Secondary | ICD-10-CM | POA: Diagnosis not present

## 2013-08-08 DIAGNOSIS — M545 Low back pain, unspecified: Secondary | ICD-10-CM | POA: Diagnosis not present

## 2013-08-12 DIAGNOSIS — R82998 Other abnormal findings in urine: Secondary | ICD-10-CM | POA: Diagnosis not present

## 2013-08-12 DIAGNOSIS — C679 Malignant neoplasm of bladder, unspecified: Secondary | ICD-10-CM | POA: Diagnosis not present

## 2013-08-13 ENCOUNTER — Other Ambulatory Visit: Payer: Self-pay | Admitting: Urology

## 2013-08-17 DIAGNOSIS — D500 Iron deficiency anemia secondary to blood loss (chronic): Secondary | ICD-10-CM | POA: Diagnosis not present

## 2013-08-18 ENCOUNTER — Encounter (HOSPITAL_COMMUNITY): Payer: Self-pay | Admitting: Emergency Medicine

## 2013-08-18 ENCOUNTER — Emergency Department (HOSPITAL_COMMUNITY): Payer: Medicare Other

## 2013-08-18 ENCOUNTER — Inpatient Hospital Stay (HOSPITAL_COMMUNITY)
Admission: EM | Admit: 2013-08-18 | Discharge: 2013-08-24 | DRG: 064 | Disposition: A | Discharge status: Rehabilitation Facility | Payer: Medicare Other | Attending: Internal Medicine | Admitting: Internal Medicine

## 2013-08-18 DIAGNOSIS — I639 Cerebral infarction, unspecified: Secondary | ICD-10-CM

## 2013-08-18 DIAGNOSIS — C679 Malignant neoplasm of bladder, unspecified: Secondary | ICD-10-CM | POA: Diagnosis not present

## 2013-08-18 DIAGNOSIS — Z87891 Personal history of nicotine dependence: Secondary | ICD-10-CM | POA: Diagnosis not present

## 2013-08-18 DIAGNOSIS — I059 Rheumatic mitral valve disease, unspecified: Secondary | ICD-10-CM | POA: Diagnosis present

## 2013-08-18 DIAGNOSIS — I10 Essential (primary) hypertension: Secondary | ICD-10-CM

## 2013-08-18 DIAGNOSIS — Z79899 Other long term (current) drug therapy: Secondary | ICD-10-CM

## 2013-08-18 DIAGNOSIS — R4182 Altered mental status, unspecified: Secondary | ICD-10-CM | POA: Diagnosis not present

## 2013-08-18 DIAGNOSIS — Z9181 History of falling: Secondary | ICD-10-CM

## 2013-08-18 DIAGNOSIS — N390 Urinary tract infection, site not specified: Secondary | ICD-10-CM | POA: Diagnosis not present

## 2013-08-18 DIAGNOSIS — F0390 Unspecified dementia without behavioral disturbance: Secondary | ICD-10-CM | POA: Diagnosis present

## 2013-08-18 DIAGNOSIS — R279 Unspecified lack of coordination: Secondary | ICD-10-CM | POA: Diagnosis not present

## 2013-08-18 DIAGNOSIS — D649 Anemia, unspecified: Secondary | ICD-10-CM | POA: Diagnosis not present

## 2013-08-18 DIAGNOSIS — Z5189 Encounter for other specified aftercare: Secondary | ICD-10-CM | POA: Diagnosis not present

## 2013-08-18 DIAGNOSIS — F29 Unspecified psychosis not due to a substance or known physiological condition: Secondary | ICD-10-CM | POA: Diagnosis not present

## 2013-08-18 DIAGNOSIS — E785 Hyperlipidemia, unspecified: Secondary | ICD-10-CM | POA: Diagnosis not present

## 2013-08-18 DIAGNOSIS — Z8673 Personal history of transient ischemic attack (TIA), and cerebral infarction without residual deficits: Secondary | ICD-10-CM | POA: Diagnosis present

## 2013-08-18 DIAGNOSIS — F02818 Dementia in other diseases classified elsewhere, unspecified severity, with other behavioral disturbance: Secondary | ICD-10-CM | POA: Diagnosis not present

## 2013-08-18 DIAGNOSIS — E860 Dehydration: Secondary | ICD-10-CM | POA: Diagnosis not present

## 2013-08-18 DIAGNOSIS — I6350 Cerebral infarction due to unspecified occlusion or stenosis of unspecified cerebral artery: Secondary | ICD-10-CM | POA: Diagnosis not present

## 2013-08-18 DIAGNOSIS — G8190 Hemiplegia, unspecified affecting unspecified side: Secondary | ICD-10-CM | POA: Diagnosis present

## 2013-08-18 DIAGNOSIS — E7800 Pure hypercholesterolemia, unspecified: Secondary | ICD-10-CM | POA: Diagnosis not present

## 2013-08-18 DIAGNOSIS — S0990XA Unspecified injury of head, initial encounter: Secondary | ICD-10-CM | POA: Diagnosis not present

## 2013-08-18 DIAGNOSIS — I1310 Hypertensive heart and chronic kidney disease without heart failure, with stage 1 through stage 4 chronic kidney disease, or unspecified chronic kidney disease: Secondary | ICD-10-CM | POA: Diagnosis present

## 2013-08-18 DIAGNOSIS — Z954 Presence of other heart-valve replacement: Secondary | ICD-10-CM

## 2013-08-18 DIAGNOSIS — G9340 Encephalopathy, unspecified: Secondary | ICD-10-CM

## 2013-08-18 DIAGNOSIS — I359 Nonrheumatic aortic valve disorder, unspecified: Secondary | ICD-10-CM

## 2013-08-18 DIAGNOSIS — F0281 Dementia in other diseases classified elsewhere with behavioral disturbance: Secondary | ICD-10-CM | POA: Diagnosis not present

## 2013-08-18 DIAGNOSIS — R4789 Other speech disturbances: Secondary | ICD-10-CM | POA: Diagnosis not present

## 2013-08-18 DIAGNOSIS — I6330 Cerebral infarction due to thrombosis of unspecified cerebral artery: Secondary | ICD-10-CM | POA: Diagnosis not present

## 2013-08-18 DIAGNOSIS — R0789 Other chest pain: Secondary | ICD-10-CM

## 2013-08-18 LAB — URINALYSIS, ROUTINE W REFLEX MICROSCOPIC
BILIRUBIN URINE: NEGATIVE
Glucose, UA: NEGATIVE mg/dL
Ketones, ur: 15 mg/dL — AB
Nitrite: NEGATIVE
PROTEIN: NEGATIVE mg/dL
Specific Gravity, Urine: 1.018 (ref 1.005–1.030)
UROBILINOGEN UA: 0.2 mg/dL (ref 0.0–1.0)
pH: 6 (ref 5.0–8.0)

## 2013-08-18 LAB — CBC WITH DIFFERENTIAL/PLATELET
BASOS PCT: 0 % (ref 0–1)
Basophils Absolute: 0 10*3/uL (ref 0.0–0.1)
EOS ABS: 0.1 10*3/uL (ref 0.0–0.7)
Eosinophils Relative: 1 % (ref 0–5)
HCT: 32.5 % — ABNORMAL LOW (ref 39.0–52.0)
HEMOGLOBIN: 10.9 g/dL — AB (ref 13.0–17.0)
LYMPHS ABS: 0.7 10*3/uL (ref 0.7–4.0)
Lymphocytes Relative: 14 % (ref 12–46)
MCH: 32.4 pg (ref 26.0–34.0)
MCHC: 33.5 g/dL (ref 30.0–36.0)
MCV: 96.7 fL (ref 78.0–100.0)
Monocytes Absolute: 0.3 10*3/uL (ref 0.1–1.0)
Monocytes Relative: 7 % (ref 3–12)
NEUTROS ABS: 3.8 10*3/uL (ref 1.7–7.7)
NEUTROS PCT: 78 % — AB (ref 43–77)
Platelets: 172 10*3/uL (ref 150–400)
RBC: 3.36 MIL/uL — AB (ref 4.22–5.81)
RDW: 15.2 % (ref 11.5–15.5)
WBC: 4.8 10*3/uL (ref 4.0–10.5)

## 2013-08-18 LAB — POCT I-STAT, CHEM 8
BUN: 28 mg/dL — AB (ref 6–23)
CREATININE: 1.4 mg/dL — AB (ref 0.50–1.35)
Calcium, Ion: 1.26 mmol/L (ref 1.13–1.30)
Chloride: 104 mEq/L (ref 96–112)
GLUCOSE: 89 mg/dL (ref 70–99)
HCT: 34 % — ABNORMAL LOW (ref 39.0–52.0)
HEMOGLOBIN: 11.6 g/dL — AB (ref 13.0–17.0)
Potassium: 4.6 mEq/L (ref 3.7–5.3)
Sodium: 141 mEq/L (ref 137–147)
TCO2: 25 mmol/L (ref 0–100)

## 2013-08-18 LAB — COMPREHENSIVE METABOLIC PANEL
ALBUMIN: 3.5 g/dL (ref 3.5–5.2)
ALT: 11 U/L (ref 0–53)
AST: 19 U/L (ref 0–37)
Alkaline Phosphatase: 76 U/L (ref 39–117)
BUN: 28 mg/dL — ABNORMAL HIGH (ref 6–23)
CO2: 25 mEq/L (ref 19–32)
Calcium: 8.7 mg/dL (ref 8.4–10.5)
Chloride: 103 mEq/L (ref 96–112)
Creatinine, Ser: 1.34 mg/dL (ref 0.50–1.35)
GFR calc Af Amer: 52 mL/min — ABNORMAL LOW (ref >90)
GFR calc non Af Amer: 45 mL/min — ABNORMAL LOW (ref >90)
Glucose, Bld: 88 mg/dL (ref 70–99)
POTASSIUM: 4.7 meq/L (ref 3.7–5.3)
Sodium: 140 mEq/L (ref 137–147)
TOTAL PROTEIN: 7.4 g/dL (ref 6.0–8.3)
Total Bilirubin: 0.2 mg/dL — ABNORMAL LOW (ref 0.3–1.2)

## 2013-08-18 LAB — URINE MICROSCOPIC-ADD ON

## 2013-08-18 MED ORDER — HYDRALAZINE HCL 20 MG/ML IJ SOLN
5.0000 mg | Freq: Four times a day (QID) | INTRAMUSCULAR | Status: DC | PRN
  Administered 2013-08-19: 5 mg via INTRAVENOUS
  Filled 2013-08-18: qty 1

## 2013-08-18 MED ORDER — CIPROFLOXACIN IN D5W 400 MG/200ML IV SOLN
400.0000 mg | Freq: Two times a day (BID) | INTRAVENOUS | Status: DC
  Administered 2013-08-18 – 2013-08-19 (×2): 400 mg via INTRAVENOUS
  Filled 2013-08-18 (×3): qty 200

## 2013-08-18 MED ORDER — ENOXAPARIN SODIUM 30 MG/0.3ML ~~LOC~~ SOLN: 30.0000 mg | SUBCUTANEOUS | Status: DC

## 2013-08-18 MED ORDER — SENNOSIDES-DOCUSATE SODIUM 8.6-50 MG PO TABS
1.0000 | ORAL_TABLET | Freq: Every evening | ORAL | Status: DC | PRN
  Filled 2013-08-18: qty 1

## 2013-08-18 NOTE — ED Notes (Signed)
Transporting patient to new room assignment. 

## 2013-08-18 NOTE — ED Notes (Signed)
Pt's wife st's pt has hx of dementia but has had a big mental change in past 2-3 days.  Also st's that pt fell on 12/25 into bathtub but did not have any injuries at that time.  Pt disoriented to time and place.

## 2013-08-18 NOTE — ED Provider Notes (Signed)
CSN: [id number]     Arrival date & time 08/18/13  1657 History   First MD Initiated Contact with Patient 08/18/13 1723     Chief Complaint  Patient presents with  . Altered Mental Status   HPI  79 y/o male with history as noted below who presents with cc of AMS. The patient was in his usual state of health until Sunday. Beginning at that time at noon the patient has had intermittent confusion. He has been getting confused in regards to where he is at at times. The patient\'s wife states that today he didn\'t know how to use his eye drops like he usually does. He fell on 12/25. The patient\'s wife states that he appeared to be not injured as a result of the fall but then began to complain of right shoulder pain. He was seen by orthopedics as an outpatient and had a normal x-ray.   Past Medical History  Diagnosis Date  . Anemia     from AVM\'s. Requiring transfusion  . Hyperlipemia   . Aortic stenosis     s/p TAVI July 2012  . Dementia   . Glaucoma   . Mitral regurgitation   . Shingles May 2013    right arm residual pain- last outbreak 1 1/2 yrs ago.  . Hypertension     Not taking Lisinopril due to BP drops low.  . Transfusion history     last 2 yrs ago.  . GERD (gastroesophageal reflux disease)     RARE - AND NO MEDS  . Cancer     BLADDER CANCER  . Arthritis   . Balance problem     WEAK ON RIGHT SIDE ( SCOLISOSIS AND HX OF A BACK SURGERY) - BALANCE PROBLEM - "FALLS BACKWARD" - FREQUENT FALLS.  USES CANE WHEN AMBULATING  . Complication of anesthesia     VERY DROWSY FOR HOURS AFTER SURGERY - AND DISORIENTATION  . Heart murmur     PT\'S CARDIOLOGIST IS DR. NAHSER -LAST OFFICE VISIT 03/02/13 IN [facility]   Past Surgical History  Procedure Laterality Date  . Appendectomy    . Cardiac catheterization  10/23/2010    ef 65%  . Us echocardiography  08/31/10    EF 55-60%  . Cardiovascular stress test  01/01/2007    EF 64%, NO EVIDENCE OF ISCHEMIA  . Cardiac valve replacement      07 /2012([facility])  . Transurethral resection of bladder tumor with gyrus (turbt-gyrus) N/A 04/13/2013    Procedure: TRANSURETHRAL RESECTION OF BLADDER TUMOR WITH GYRUS (TURBT-GYRUS);  Surgeon: [name], MD;  Location: [facility];  Service: Urology;  Laterality: N/A;  . Cystoscopy with stent placement Left 04/13/2013    Procedure:  LEFT URETERAL STENT PLACEMENT;  Surgeon: [name], MD;  Location: [facility];  Service: Urology;  Laterality: Left;  . Back surgery  2010 OR 2011  . Transurethral resection of bladder tumor N/A 06/04/2013    Procedure: RESTAGING TRANSURETHRAL RESECTION OF BLADDER TUMOR (TURBT), URETERAL CATHERIZATION ;  Surgeon: [name], MD;  Location: [facility];  Service: Urology;  Laterality: N/A;  CYSTO RESTAGING TURBT      History reviewed. No pertinent family history. History  Substance Use Topics  . Smoking status: Former Smoker    Quit date: 07/04/1981  . Smokeless tobacco: Not on file  . Alcohol Use: 0.6 oz/week    1 Glasses of wine per week     Comment: nightly  Review of Systems  Constitutional: Negative for  fever and chills.  Respiratory: Negative for shortness of breath.   Cardiovascular: Negative for chest pain.  Gastrointestinal: Negative for nausea, vomiting and abdominal pain.  Genitourinary: Negative for dysuria and frequency.  Psychiatric/Behavioral: Positive for confusion.  All other systems reviewed and are negative.   Allergies  Advil; Aspirin; and Penicillins  Home Medications   Current Outpatient Rx  Name  Route  Sig  Dispense  Refill  . acetaminophen (TYLENOL) 325 MG tablet   Oral   Take 650 mg by mouth every 6 (six) hours as needed for moderate pain.          . brimonidine (ALPHAGAN P) 0.1 % SOLN   Both Eyes   Place 1 drop into both eyes 2 (two) times daily.          . cholecalciferol (VITAMIN D) 1000 UNITS tablet   Oral   Take 1,000 Units by mouth every morning.          . dorzolamide-timolol (COSOPT) 22.3-6.8 MG/ML ophthalmic solution    Both Eyes   Place 1 drop into both eyes every 12 (twelve) hours.          . iron polysaccharides (NIFEREX) 150 MG capsule   Oral   Take 150 mg by mouth every morning.          . latanoprost (XALATAN) 0.005 % ophthalmic solution   Both Eyes   Place 1 drop into both eyes at bedtime.          . Multiple Vitamin (MULTIVITAMIN WITH MINERALS) TABS tablet   Oral   Take 1 tablet by mouth every morning.          . vitamin B-12 (CYANOCOBALAMIN) 250 MCG tablet   Oral   Take 250 mcg by mouth every morning.           BP 167/63  Pulse 80  Temp(Src) 98.2 F (36.8 C) (Oral)  Resp 14  SpO2 99% Physical Exam  Constitutional: He appears well-developed and well-nourished. No distress.  HENT:  Head: Normocephalic and atraumatic.  Mouth/Throat: No oropharyngeal exudate.  Eyes: Conjunctivae are normal. Pupils are equal, round, and reactive to light.  Neck: Normal range of motion. Neck supple.  Cardiovascular: Normal rate and normal heart sounds.  Exam reveals no gallop and no friction rub.   No murmur heard. Pulmonary/Chest: Effort normal and breath sounds normal.  Abdominal: Soft. He exhibits no distension. There is no tenderness.  Musculoskeletal: Normal range of motion. He exhibits no edema and no tenderness.  Neurological: He is alert. He has normal strength and normal reflexes. No cranial nerve deficit or sensory deficit. Coordination normal. GCS eye subscore is 4. GCS verbal subscore is 5. GCS motor subscore is 6.  Oriented to self. Oriented to place. Not oriented to year or president.   Skin: Skin is warm and dry.   ED Course  Procedures (including critical care time) Labs Review Labs Reviewed  URINALYSIS, ROUTINE W REFLEX MICROSCOPIC - Abnormal; Notable for the following:    APPearance CLOUDY (*)    Hgb urine dipstick LARGE (*)    Ketones, ur 15 (*)    Leukocytes, UA SMALL (*)    All other components within normal limits  CBC WITH DIFFERENTIAL - Abnormal; Notable for  the following:    RBC 3.36 (*)    Hemoglobin 10.9 (*)    HCT 32.5 (*)    Neutrophils Relative % 78 (*)    All other components within normal limits  COMPREHENSIVE METABOLIC  PANEL - Abnormal; Notable for the following:    BUN 28 (*)    Total Bilirubin 0.2 (*)    GFR calc non Af Amer 45 (*)    GFR calc Af Amer 52 (*)    All other components within normal limits  POCT I-STAT, CHEM 8 - Abnormal; Notable for the following:    BUN 28 (*)    Creatinine, Ser 1.40 (*)    Hemoglobin 11.6 (*)    HCT 34.0 (*)    All other components within normal limits  URINE CULTURE  URINE MICROSCOPIC-ADD ON  HEMOGLOBIN A1C  LIPID PANEL   Imaging Review Dg Chest 2 View  08/18/2013   CLINICAL DATA:  Acute mental status changes.  EXAM: CHEST  2 VIEW  COMPARISON:  One view chest x-ray 07/20/2012 and two-view chest x-ray 12/11/2011.  FINDINGS: AP erect and lateral chest again demonstrate an aortic valve prosthesis which appears appropriately oriented. Cardiac silhouette mildly enlarged but stable. Thoracic aorta mildly tortuous and atherosclerotic, unchanged. Calcified left hilar lymph nodes, unchanged. Lungs clear. Bronchovascular markings normal. Pulmonary vascularity normal. No visible pleural effusions. No pneumothorax. Degenerative changes involving the thoracic spine.  IMPRESSION: Stable mild cardiomegaly.  No acute cardiopulmonary disease.   Electronically Signed   By: Evangeline Dakin M.D.   On: 08/18/2013 19:09   Ct Head Wo Contrast  08/18/2013   CLINICAL DATA:  Altered mental status, baseline dementia, recent fall  EXAM: CT HEAD WITHOUT CONTRAST  TECHNIQUE: Contiguous axial images were obtained from the base of the skull through the vertex without intravenous contrast.  COMPARISON:  08/13/2008  FINDINGS: No evidence of parenchymal hemorrhage or extra-axial fluid collection. No mass lesion, mass effect, or midline shift.  10 mm hypodensity in the left internal capsule (series 2/image 15), suspicious for age  indeterminate lacunar infarct, new from 2010.  Subcortical white matter and periventricular small vessel ischemic changes. Mild intracranial atherosclerosis.  Global cortical atrophy.  Stable secondary ventricular prominence.  The visualized paranasal sinuses are essentially clear. The mastoid air cells are unopacified.  No evidence of calvarial fracture.  IMPRESSION: Suspected 10 mm lacunar infarct in the left internal capsule, age indeterminate, new from 2010.  Atrophy with small vessel ischemic changes.   Electronically Signed   By: Julian Hy M.D.   On: 08/18/2013 18:50    EKG Interpretation   None      MDM   1. Encephalopathy     Here with AMS. Has had rapid decline over past two days. Is having trouble remember things and performing certain ADL's. Has a history of bladder cancer. Has had recurrence and is scheduled for surgery next week. Afebrile. VSS. Overall well appearing but not oriented to year and doesn't know who the president is (both of which the family states is different). Exam is otherwise non-focal. CT head without acute abnormality but does reveal an age indeterminate infarct in left internal capsule. Without additional weakness, numbness or aphasia this lesion is unlikely to be the culprit of his symptoms. UA doesn't appear to be c/w UTI. CXR without obvious pneumonia. Labs unremarkable. Unclear etiology behind mental status change. The patient was admitted to medicine for further workup.     Donita Brooks, MD 08/18/13 2322

## 2013-08-18 NOTE — H&P (Addendum)
Hospitalist Admission History and Physical  Patient name: Bryan Moore Medical record number: [id number] Date of birth: [birth year] Age: 80 y.o. Gender: male  Primary Care Provider: [name], MD  Chief Complaint: encephalopathy History of Present Illness:This is a 80 y.o. year old male with multiple medical problems including dementia, HTN, aortic valve disease s/p AVR, recurrent GI bleeds 2/2 AVMs, frequent falls, bladder cancer presenting with confusion x 2-3 days. Level V caveat as pt is acutely confused. Primary history is from wife. Per wife, pt became acutely confused on Sunday. Wife states that pt is otherwise alert and able to perform his ADLs at baseline. Pt has been having intermittent outbursts as well as asking for deceased relatives. Wife denies any recent illnesses. Pt did reportedly fall in the shower on christmas, but there was no direct head trauma. Wife denies any overt hemiparesis, though she does report mild worsening of R sided weakness since Sunday. Pt also with increased urinary frequency. Appetite has been mildly decreased. No reported melena. Not on aspirin. Pt has been seen by urology for recent cystoscopies and bladder cancer resections. Wife denies any hematuria.  In the ER, head CT shows L lacunar infarct in L internal capsule, small vessel ischemic changes. UA with mild changes consistent with infection. CXR with cardiomegaly, otherwise no infiltrate,   Patient Active Problem List   Diagnosis Date Noted  . Encephalopathy 08/18/2013  . Bladder cancer 04/15/2013  . 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 Frequent falls 07/17/2012  . Fall 12/17/2011  . Atypical chest pain 12/10/2011  . Aortic valve disease 03/28/2011  . Hypertension 03/28/2011  . Hyperlipidemia 03/28/2011  . Anemia 03/28/2011   Past Medical History: Past Medical History  Diagnosis Date  . Anemia     from AVM\'s. Requiring transfusion  . Hyperlipemia   . Aortic stenosis     s/p TAVI July 2012   . Dementia   . Glaucoma   . Mitral regurgitation   . Shingles May 2013    right arm residual pain- last outbreak 1 1/2 yrs ago.  . Hypertension     Not taking Lisinopril due to BP drops low.  . Transfusion history     last 2 yrs ago.  . GERD (gastroesophageal reflux disease)     RARE - AND NO MEDS  . Cancer     BLADDER CANCER  . Arthritis   . Balance problem     WEAK ON RIGHT SIDE ( SCOLISOSIS AND HX OF A BACK SURGERY) - BALANCE PROBLEM - "FALLS BACKWARD" - FREQUENT FALLS.  USES CANE WHEN AMBULATING  . Complication of anesthesia     VERY DROWSY FOR HOURS AFTER SURGERY - AND DISORIENTATION  . Heart murmur     PT\'S CARDIOLOGIST IS DR. NAHSER -LAST OFFICE VISIT 03/02/13 IN [facility]    Past Surgical History: Past Surgical History  Procedure Laterality Date  . Appendectomy    . Cardiac catheterization  10/23/2010    ef 65%  . Us echocardiography  08/31/10    EF 55-60%  . Cardiovascular stress test  01/01/2007    EF 64%, NO EVIDENCE OF ISCHEMIA  . Cardiac valve replacement      07 /2012([facility])  . Transurethral resection of bladder tumor with gyrus (turbt-gyrus) N/A 04/13/2013    Procedure: TRANSURETHRAL RESECTION OF BLADDER TUMOR WITH GYRUS (TURBT-GYRUS);  Surgeon: [name], MD;  Location: [facility];  Service: Urology;  Laterality: N/A;  . Cystoscopy with stent placement Left 04/13/2013    Procedure:  LEFT URETERAL STENT PLACEMENT;  Surgeon: [name], MD;  Location: [facility];  Service: Urology;  Laterality: Left;  . Back surgery  2010 OR 2011  . Transurethral resection of bladder tumor N/A 06/04/2013    Procedure: RESTAGING TRANSURETHRAL RESECTION OF BLADDER TUMOR (TURBT), URETERAL CATHERIZATION ;  Surgeon: [name], MD;  Location: [facility];  Service: Urology;  Laterality: N/A;  CYSTO RESTAGING TURBT       Social History: History   Social History  . Marital Status: Married    Spouse Name: N/A    Number of Children: N/A  . Years of Education: N/A   Social History Main  Topics  . Smoking status: Former Smoker    Quit date: 07/04/1981  . Smokeless tobacco: None  . Alcohol Use: 0.6 oz/week    1 Glasses of wine per week     Comment: nightly  . Drug Use: No  . Sexual Activity: Not Currently   Other Topics Concern  . None   Social History Narrative  . None    Family History: History reviewed. No pertinent family history.  Allergies: Allergies  Allergen Reactions  . Advil [Ibuprofen] Other (See Comments)    Causes stomach bleeding, need transfusions as result  . Aspirin Other (See Comments)    Causes stomach bleeding, needs transfusion as result  . Penicillins Itching and Swelling    Current Facility-Administered Medications  Medication Dose Route Frequency Provider Last Rate Last Dose  . ciprofloxacin (CIPRO) IVPB 400 mg  400 mg Intravenous Q12H Shanda Howells, MD      . senna-docusate (Senokot-S) tablet 1 tablet  1 tablet Oral QHS PRN Shanda Howells, MD       Current Outpatient Prescriptions  Medication Sig Dispense Refill  . acetaminophen (TYLENOL) 325 MG tablet Take 650 mg by mouth every 6 (six) hours as needed for moderate pain.       . brimonidine (ALPHAGAN P) 0.1 % SOLN Place 1 drop into both eyes 2 (two) times daily.       . cholecalciferol (VITAMIN D) 1000 UNITS tablet Take 1,000 Units by mouth every morning.       . dorzolamide-timolol (COSOPT) 22.3-6.8 MG/ML ophthalmic solution Place 1 drop into both eyes every 12 (twelve) hours.       . iron polysaccharides (NIFEREX) 150 MG capsule Take 150 mg by mouth every morning.       . latanoprost (XALATAN) 0.005 % ophthalmic solution Place 1 drop into both eyes at bedtime.       . Multiple Vitamin (MULTIVITAMIN WITH MINERALS) TABS tablet Take 1 tablet by mouth every morning.       . vitamin B-12 (CYANOCOBALAMIN) 250 MCG tablet Take 250 mcg by mouth every morning.        Review Of Systems: 12 point ROS negative except as noted above in HPI.  Physical Exam: Filed Vitals:   08/18/13 2202   BP: 160/120  Pulse: 94  Temp:   Resp:     General: in no distress, confused  HEENT: PERRLA and extra ocular movement intact Heart: S1, S2 normal, no murmur, rub or gallop, regular rate and rhythm Lungs: clear to auscultation, no wheezes or rales and unlabored breathing Abdomen: abdomen is soft without significant tenderness, masses, organomegaly or guarding Extremities: extremities normal, atraumatic, no cyanosis or edema Skin:no rashes Neurology: mild R sided weakness, otherwise no focal neurological deficits   Labs and Imaging: Lab Results  Component Value Date/Time   NA 141 08/18/2013  7:28  PM   K 4.6 08/18/2013  7:28 PM   CL 104 08/18/2013  7:28 PM   CO2 25 08/18/2013  6:19 PM   BUN 28* 08/18/2013  7:28 PM   CREATININE 1.40* 08/18/2013  7:28 PM   GLUCOSE 89 08/18/2013  7:28 PM   Lab Results  Component Value Date   WBC 4.8 08/18/2013   HGB 11.6* 08/18/2013   HCT 34.0* 08/18/2013   MCV 96.7 08/18/2013   PLT 172 08/18/2013   Urinalysis    Component Value Date/Time   COLORURINE YELLOW 08/18/2013 1937   APPEARANCEUR CLOUDY* 08/18/2013 1937   LABSPEC 1.018 08/18/2013 1937   PHURINE 6.0 08/18/2013 1937   GLUCOSEU NEGATIVE 08/18/2013 1937   HGBUR LARGE* 08/18/2013 1937   BILIRUBINUR NEGATIVE 08/18/2013 1937   KETONESUR 15* 08/18/2013 1937   PROTEINUR NEGATIVE 08/18/2013 1937   UROBILINOGEN 0.2 08/18/2013 1937   NITRITE NEGATIVE 08/18/2013 1937   LEUKOCYTESUR SMALL* 08/18/2013 1937       Dg Chest 2 View  08/18/2013   CLINICAL DATA:  Acute mental status changes.  EXAM: CHEST  2 VIEW  COMPARISON:  One view chest x-ray 07/20/2012 and two-view chest x-ray 12/11/2011.  FINDINGS: AP erect and lateral chest again demonstrate an aortic valve prosthesis which appears appropriately oriented. Cardiac silhouette mildly enlarged but stable. Thoracic aorta mildly tortuous and atherosclerotic, unchanged. Calcified left hilar lymph nodes, unchanged. Lungs clear. Bronchovascular markings normal. Pulmonary  vascularity normal. No visible pleural effusions. No pneumothorax. Degenerative changes involving the thoracic spine.  IMPRESSION: Stable mild cardiomegaly.  No acute cardiopulmonary disease.   Electronically Signed   By: Evangeline Dakin M.D.   On: 08/18/2013 19:09   Ct Head Wo Contrast  08/18/2013   CLINICAL DATA:  Altered mental status, baseline dementia, recent fall  EXAM: CT HEAD WITHOUT CONTRAST  TECHNIQUE: Contiguous axial images were obtained from the base of the skull through the vertex without intravenous contrast.  COMPARISON:  08/13/2008  FINDINGS: No evidence of parenchymal hemorrhage or extra-axial fluid collection. No mass lesion, mass effect, or midline shift.  10 mm hypodensity in the left internal capsule (series 2/image 15), suspicious for age indeterminate lacunar infarct, new from 2010.  Subcortical white matter and periventricular small vessel ischemic changes. Mild intracranial atherosclerosis.  Global cortical atrophy.  Stable secondary ventricular prominence.  The visualized paranasal sinuses are essentially clear. The mastoid air cells are unopacified.  No evidence of calvarial fracture.  IMPRESSION: Suspected 10 mm lacunar infarct in the left internal capsule, age indeterminate, new from 2010.  Atrophy with small vessel ischemic changes.   Electronically Signed   By: Julian Hy M.D.   On: 08/18/2013 18:50     Assessment and Plan: HAVYN ROHS is a 80 y.o. year old male presenting with encephalopathy   Encephalopathy: Fairly broad differential for symptoms including CVA, infection, dehydration. Will proceed done CVA pathway. MRI, MRA, 2D ECHO, carotid dopplers. ASA contraindicated given hx/o AVMs. ? UTI. Will start on IV cipro for treatment pending urine culture. Mildly dry on exam. Noted BUN/CR ratio 20:1. Gentle hydration. Will reassess in am. ? Neuro consult in am.   HTN: Elevated BP. Hold oral meds pending swallow eval. PRN hydralazine.  Anemia: Hgb stable @ 10.9.  Continue to closely follow.  FEN/GI: NPO.  Prophylaxis: SCDs. Hold on anticoagulation given hx/o GIBs.  Disposition: pending further evaluation.  Code Status:Full Code        Shanda Howells MD  Pager: [phone]

## 2013-08-18 NOTE — ED Provider Notes (Addendum)
Patient seen and evaluated.  Care was discussed with him the sofa and the resident, Dr. Jamse Arn.  Nonfocal neurological exam but confusion.  Starting to speak with [place] that brother having episodes of confusion and marked acute short-term memory loss.  But no signs of hemiparesis, aphasia, no electrolyte or metabolic disturbance or fever or obvious infection.  Patient admitted for further evaluation for encephalopathy.  Tanna Furry, MD 08/18/13 2247  I saw and evaluated the patient, reviewed the resident's note and I agree with the findings and plan.  EKG Interpretation    Date/Time:  Tuesday August 18 2013 17:35:39 EST Ventricular Rate:  60 PR Interval:  194 QRS Duration: 140 QT Interval:  520 QTC Calculation: 520 R Axis:   58 Text Interpretation:  Sinus rhythm Ventricular premature complex Left bundle branch block ED PHYSICIAN INTERPRETATION AVAILABLE IN [facility] Confirmed by TEST, RECORD ([id number]) on 08/20/2013 11:58:19 AM              Tanna Furry, MD 08/22/13 [place], MD 08/22/13 [id number]

## 2013-08-19 ENCOUNTER — Observation Stay (HOSPITAL_COMMUNITY): Payer: Medicare Other

## 2013-08-19 DIAGNOSIS — N390 Urinary tract infection, site not specified: Secondary | ICD-10-CM

## 2013-08-19 DIAGNOSIS — I6350 Cerebral infarction due to unspecified occlusion or stenosis of unspecified cerebral artery: Principal | ICD-10-CM

## 2013-08-19 DIAGNOSIS — I059 Rheumatic mitral valve disease, unspecified: Secondary | ICD-10-CM

## 2013-08-19 DIAGNOSIS — D649 Anemia, unspecified: Secondary | ICD-10-CM

## 2013-08-19 DIAGNOSIS — Z8673 Personal history of transient ischemic attack (TIA), and cerebral infarction without residual deficits: Secondary | ICD-10-CM

## 2013-08-19 HISTORY — DX: Personal history of transient ischemic attack (TIA), and cerebral infarction without residual deficits: Z86.73

## 2013-08-19 LAB — CBC
HCT: 33.2 % — ABNORMAL LOW (ref 39.0–52.0)
Hemoglobin: 11.5 g/dL — ABNORMAL LOW (ref 13.0–17.0)
MCH: 32.5 pg (ref 26.0–34.0)
MCHC: 34.6 g/dL (ref 30.0–36.0)
MCV: 93.8 fL (ref 78.0–100.0)
PLATELETS: 165 10*3/uL (ref 150–400)
RBC: 3.54 MIL/uL — ABNORMAL LOW (ref 4.22–5.81)
RDW: 14.6 % (ref 11.5–15.5)
WBC: 4.6 10*3/uL (ref 4.0–10.5)

## 2013-08-19 LAB — HEMOGLOBIN A1C
HEMOGLOBIN A1C: 5.2 % (ref <5.7)
Mean Plasma Glucose: 103 mg/dL (ref <117)

## 2013-08-19 LAB — URINE CULTURE
Colony Count: NO GROWTH
Culture: NO GROWTH

## 2013-08-19 LAB — LIPID PANEL
CHOLESTEROL: 169 mg/dL (ref 0–200)
HDL: 54 mg/dL (ref >39)
LDL Cholesterol: 95 mg/dL (ref 0–99)
Total CHOL/HDL Ratio: 3.1 RATIO
Triglycerides: 99 mg/dL (ref <150)
VLDL: 20 mg/dL (ref 0–40)

## 2013-08-19 LAB — AMMONIA: Ammonia: 37 umol/L (ref 11–60)

## 2013-08-19 LAB — BASIC METABOLIC PANEL
BUN: 24 mg/dL — ABNORMAL HIGH (ref 6–23)
CO2: 23 meq/L (ref 19–32)
CREATININE: 1.16 mg/dL (ref 0.50–1.35)
Calcium: 8.9 mg/dL (ref 8.4–10.5)
Chloride: 103 mEq/L (ref 96–112)
GFR calc Af Amer: 62 mL/min — ABNORMAL LOW (ref >90)
GFR, EST NON AFRICAN AMERICAN: 53 mL/min — AB (ref >90)
GLUCOSE: 85 mg/dL (ref 70–99)
Potassium: 4.2 mEq/L (ref 3.7–5.3)
Sodium: 139 mEq/L (ref 137–147)

## 2013-08-19 MED ORDER — DIPHENHYDRAMINE HCL 25 MG PO CAPS
25.0000 mg | ORAL_CAPSULE | Freq: Once | ORAL | Status: AC
  Administered 2013-08-19: 25 mg via ORAL
  Filled 2013-08-19: qty 1

## 2013-08-19 MED ORDER — SODIUM CHLORIDE 0.9 % IV SOLN
INTRAVENOUS | Status: DC
  Administered 2013-08-19 – 2013-08-22 (×3): via INTRAVENOUS

## 2013-08-19 MED ORDER — DEXTROSE 5 % IV SOLN
1.0000 g | INTRAVENOUS | Status: DC
  Administered 2013-08-19: 1 g via INTRAVENOUS
  Filled 2013-08-19 (×2): qty 10

## 2013-08-19 MED ORDER — LORAZEPAM 2 MG/ML IJ SOLN
0.2500 mg | Freq: Once | INTRAMUSCULAR | Status: AC
  Administered 2013-08-19: 0.25 mg via INTRAVENOUS
  Filled 2013-08-19: qty 1

## 2013-08-19 MED ORDER — LORAZEPAM 2 MG/ML IJ SOLN
0.2500 mg | Freq: Three times a day (TID) | INTRAMUSCULAR | Status: DC | PRN
  Administered 2013-08-19: 0.25 mg via INTRAVENOUS
  Filled 2013-08-19: qty 1

## 2013-08-19 MED ORDER — LORAZEPAM 2 MG/ML IJ SOLN: 0.5000 mg | Freq: Four times a day (QID) | INTRAMUSCULAR | Status: DC | PRN

## 2013-08-19 MED ORDER — DIPHENHYDRAMINE HCL 25 MG PO CAPS: 25.0000 mg | ORAL_CAPSULE | Freq: Four times a day (QID) | ORAL | Status: DC | PRN

## 2013-08-19 NOTE — Progress Notes (Signed)
UR completed 

## 2013-08-19 NOTE — Plan of Care (Signed)
Pt wife called nurse into the room. Pt developed a rash on bil thighs. Wife thinks it might be from [place]. Denies sob or itching. Dr Grandville Silos notified and order received for benedryl.

## 2013-08-19 NOTE — Consult Note (Addendum)
Referring Physician: Grandville Silos    Chief Complaint: Stroke  HPI:                                                                                                                                         Bryan Moore is an 80 y.o. male with underlying dementia who was brought to the hospital due to 2-3 days of worsening confusion. Majority of history is obtained from chart as patient is unable to provide. Per note he he fell on christmas but had no deficits from his fall Wife noted increased weakness on the right side since Sunday. Wife has also noted increased urinary frequency. Pateint was brought to the ED, due to increasing confusion.  While hospitalized CT head was obtained showing left lacunar infarct in the internal capsule of indeterminate age. Neurology was consulted regarding infarct.   Of note--Allergie to ASA which caused GI bleed in the past  Date last known well: Unable to determine Time last known well: Unable to determine tPA Given: No: unable to determine last known normal  Past Medical History  Diagnosis Date  . Anemia     from AVM\'s. Requiring transfusion  . Hyperlipemia   . Aortic stenosis     s/p TAVI July 2012  . Dementia   . Glaucoma   . Mitral regurgitation   . Shingles May 2013    right arm residual pain- last outbreak 1 1/2 yrs ago.  . Hypertension     Not taking Lisinopril due to BP drops low.  . Transfusion history     last 2 yrs ago.  . GERD (gastroesophageal reflux disease)     RARE - AND NO MEDS  . Cancer     BLADDER CANCER  . Arthritis   . Balance problem     WEAK ON RIGHT SIDE ( SCOLISOSIS AND HX OF A BACK SURGERY) - BALANCE PROBLEM - "FALLS BACKWARD" - FREQUENT FALLS.  USES CANE WHEN AMBULATING  . Complication of anesthesia     VERY DROWSY FOR HOURS AFTER SURGERY - AND DISORIENTATION  . Heart murmur     PT\'S CARDIOLOGIST IS DR. NAHSER -LAST OFFICE VISIT 03/02/13 IN [facility]    Past Surgical History  Procedure Laterality Date  . Appendectomy    .  Cardiac catheterization  10/23/2010    ef 65%  . Us echocardiography  08/31/10    EF 55-60%  . Cardiovascular stress test  01/01/2007    EF 64%, NO EVIDENCE OF ISCHEMIA  . Cardiac valve replacement      07 /2012([facility])  . Transurethral resection of bladder tumor with gyrus (turbt-gyrus) N/A 04/13/2013    Procedure: TRANSURETHRAL RESECTION OF BLADDER TUMOR WITH GYRUS (TURBT-GYRUS);  Surgeon: [name], MD;  Location: [facility];  Service: Urology;  Laterality: N/A;  . Cystoscopy with stent placement Left 04/13/2013    Procedure:  LEFT URETERAL STENT PLACEMENT;  Surgeon: [name], MD;  Location: [facility];  Service: Urology;  Laterality: Left;  . Back surgery  2010 OR 2011  . Transurethral resection of bladder tumor N/A 06/04/2013    Procedure: RESTAGING TRANSURETHRAL RESECTION OF BLADDER TUMOR (TURBT), URETERAL CATHERIZATION ;  Surgeon: [name], MD;  Location: [facility];  Service: Urology;  Laterality: N/A;  CYSTO RESTAGING TURBT       Family history--not able to obtain due to patients mental capacity and dementia  Social History:  Per chart he quit smoking about 32 years ago. He does not have any smokeless tobacco history on file. He reports that he drinks about 0.6 ounces of alcohol per week. He reports that he does not use illicit drugs.  Allergies:  Allergies  Allergen Reactions  . Advil [Ibuprofen] Other (See Comments)    Causes stomach bleeding, need transfusions as result  . Aspirin Other (See Comments)    Causes stomach bleeding, needs transfusion as result  . Penicillins Itching and Swelling    Medications:                                                                                                                           Scheduled:  Continuous: . sodium chloride 75 mL/hr at 08/19/13 0953   [id number], senna-docusate  ROS:                                                                                                                                        History obtained from unobtainable from patient due to mental status   Neurologic Examination:                                                                                                      Blood pressure 178/63, pulse 84, temperature 97.4 F (36.3 C), temperature source Oral, resp. rate 18, height 5\' 8"  (1.727 m), weight 68.8 kg (151 lb 10.8 oz), SpO2 99.00%.  Mental Status: Alert,not  oriented to time, date, place and unaware of why he was brought to ED.  Speech fluent without evidence of aphasia.  Able to follow simple commands without difficulty. Cranial Nerves: II: Discs flat bilaterally; Visual fields grossly normal, pupils 41mm on the right and 69mm on the left, round, reactive to light and accommodation III,IV, VI: ptosis not present, extra-ocular motions intact bilaterally V,VII: smile symmetric, right NL fold decrease at rest, facial light touch sensation normal bilaterally VIII: hearing normal bilaterally IX,X: gag reflex present XI: bilateral shoulder shrug XII: midline tongue extension   --positive palmomental sign bilaterally  Motor: Right : Upper extremity   5/5    Left:     Upper extremity   5/5  Lower extremity   5/5     Lower extremity   5/5 --No drift was noted --resting tremor on the right and cog wheeling noted on the right.  Tone and bulk:normal tone throughout; no atrophy noted Sensory: Pinprick and light touch intact throughout, bilaterally Deep Tendon Reflexes:  Right: Upper Extremity   Left: Upper extremity   biceps (C-5 to C-6) 2/4   biceps (C-5 to C-6) 2/4 tricep (C7) 2/4    triceps (C7) 2/4 Brachioradialis (C6) 2/4  Brachioradialis (C6) 2/4  Lower Extremity Lower Extremity  quadriceps (L-2 to L-4) 0/4   quadriceps (L-2 to L-4) 0/4 Achilles (S1) 0/4   Achilles (S1) 0/4  Plantars: Right: downgoing   Left: downgoing Cerebellar: normal finger-to-nose,  normal heel-to-shin test Gait: not tested CV: pulses palpable throughout    Lab  Results: Basic Metabolic Panel:  Recent Labs Lab 08/18/13 1819 08/18/13 1928 08/19/13 0915  NA 140 141 139  K 4.7 4.6 4.2  CL 103 104 103  CO2 25  --  23  GLUCOSE 88 89 85  BUN 28* 28* 24*  CREATININE 1.34 1.40* 1.16  CALCIUM 8.7  --  8.9    Liver Function Tests:  Recent Labs Lab 08/18/13 1819  AST 19  ALT 11  ALKPHOS 76  BILITOT 0.2*  PROT 7.4  ALBUMIN 3.5   No results found for this basename: LIPASE, AMYLASE,  in the last 168 hours  Recent Labs Lab 08/19/13 0510  AMMONIA 37    CBC:  Recent Labs Lab 08/18/13 1819 08/18/13 1928 08/19/13 0915  WBC 4.8  --  4.6  NEUTROABS 3.8  --   --   HGB 10.9* 11.6* 11.5*  HCT 32.5* 34.0* 33.2*  MCV 96.7  --  93.8  PLT 172  --  165    Cardiac Enzymes: No results found for this basename: CKTOTAL, CKMB, CKMBINDEX, TROPONINI,  in the last 168 hours  Lipid Panel:  Recent Labs Lab 08/19/13 0510  CHOL 169  TRIG 99  HDL 54  CHOLHDL 3.1  VLDL 20  LDLCALC 95    CBG: No results found for this basename: GLUCAP,  in the last 168 hours  Microbiology: Results for orders placed during the hospital encounter of 03/06/11  MRSA PCR SCREENING     Status: None   Collection Time    03/06/11  7:50 PM      Result Value Range Status   MRSA by PCR NEGATIVE  NEGATIVE Final   Comment:            The GeneXpert MRSA Assay (FDA     approved for NASAL specimens     only), is one component of a     comprehensive MRSA colonization     surveillance program. It  is not     intended to diagnose MRSA     infection nor to guide or     monitor treatment for     MRSA infections.    Coagulation Studies: No results found for this basename: LABPROT, INR,  in the last 72 hours  Imaging: Dg Chest 2 View  08/18/2013   CLINICAL DATA:  Acute mental status changes.  EXAM: CHEST  2 VIEW  COMPARISON:  One view chest x-ray 07/20/2012 and two-view chest x-ray 12/11/2011.  FINDINGS: AP erect and lateral chest again demonstrate an aortic  valve prosthesis which appears appropriately oriented. Cardiac silhouette mildly enlarged but stable. Thoracic aorta mildly tortuous and atherosclerotic, unchanged. Calcified left hilar lymph nodes, unchanged. Lungs clear. Bronchovascular markings normal. Pulmonary vascularity normal. No visible pleural effusions. No pneumothorax. Degenerative changes involving the thoracic spine.  IMPRESSION: Stable mild cardiomegaly.  No acute cardiopulmonary disease.   Electronically Signed   By: Evangeline Dakin M.D.   On: 08/18/2013 19:09   Ct Head Wo Contrast  08/18/2013   CLINICAL DATA:  Altered mental status, baseline dementia, recent fall  EXAM: CT HEAD WITHOUT CONTRAST  TECHNIQUE: Contiguous axial images were obtained from the base of the skull through the vertex without intravenous contrast.  COMPARISON:  08/13/2008  FINDINGS: No evidence of parenchymal hemorrhage or extra-axial fluid collection. No mass lesion, mass effect, or midline shift.  10 mm hypodensity in the left internal capsule (series 2/image 15), suspicious for age indeterminate lacunar infarct, new from 2010.  Subcortical white matter and periventricular small vessel ischemic changes. Mild intracranial atherosclerosis.  Global cortical atrophy.  Stable secondary ventricular prominence.  The visualized paranasal sinuses are essentially clear. The mastoid air cells are unopacified.  No evidence of calvarial fracture.  IMPRESSION: Suspected 10 mm lacunar infarct in the left internal capsule, age indeterminate, new from 2010.  Atrophy with small vessel ischemic changes.   Electronically Signed   By: Julian Hy M.D.   On: 08/18/2013 18:50    Stroke Risk Factors - hyperlipidemia and hypertension   Etta Quill PA-C [facility] [phone]  08/19/2013, 4:23 PM  Patient seen and examined.  Clinical course and management discussed.  Necessary edits performed.  I agree with the above.  Assessment and plan of care developed and  discussed below.     Assessment: 80 y.o. male presenting with confusion.  Etiology unclear.  Possible UTI.  Patient of advanced age. CT scan performed and reviewed.  There is an area of hypodensity in the left internal capsule.  Acute to subacute infarct is in the differential.  Further work up recommended.  Recommendations: 1.  Treatment of UTI 2.  MRI of the brain without contrast.  Would not initiate stroke work up unless MRI indicative of an acute infarct.  3.  Telemetry monitoring 4.  Frequent neuro checks   Alexis Goodell, MD [facility] [phone]  08/19/2013  5:28 PM

## 2013-08-19 NOTE — Progress Notes (Signed)
Pt had 1.45 sec and 1.42 sec pauses. Pt is asymptomatic, resting comfortably in bed. V/S stable. NP Baltazar Najjar (on call) was notified. Will continue to monitor.

## 2013-08-19 NOTE — Evaluation (Signed)
Physical Therapy Evaluation Patient Details Name: SYAIRE ANGLADE MRN: [id number] DOB: 05/12/1920 Today's Date: 08/19/2013 Time: [id number] PT Time Calculation (min): 34 min  PT Assessment / Plan / Recommendation History of Present Illness  pt presents with 3 day hx of confusion and weakness.  pt with hx of dementia.    Clinical Impression  Pt generally unsteady and with poor awareness of safety.  Pt incontinent of urine and unaware until he is urinating.  Spoke with wife about need for SNF at D/C, however wife is wanting D/C to home.  Feel SNF is safest option as wife is only CG and pt is requiring consistent care.  Will continue to follow.      PT Assessment  Patient needs continued PT services    Follow Up Recommendations  Home health PT;Supervision/Assistance - 24 hour (Wife wants home, however SNF most appropriate.  )    Does the patient have the potential to tolerate intense rehabilitation      Barriers to Discharge Decreased caregiver support pt's wife is only family available to provide care.      Equipment Recommendations  None recommended by PT    Recommendations for Other Services     Frequency Min 3X/week    Precautions / Restrictions Precautions Precautions: Fall Restrictions Weight Bearing Restrictions: No   Pertinent Vitals/Pain Did not indicate pain.        Mobility  Bed Mobility Overal bed mobility: Needs Assistance Bed Mobility: Supine to Sit Supine to sit: Mod assist General bed mobility comments: cues for initiation and safe technique.  pt needs A to bring trunk up to sitting.   Transfers Overall transfer level: Needs assistance Equipment used: Rolling walker (2 wheeled) Transfers: Sit to/from Stand Sit to Stand: Min assist General transfer comment: cues for UE use, controlling descent to sitting, getting closer prior to sitting.   Ambulation/Gait Ambulation/Gait assistance: Mod assist Ambulation Distance (Feet): 12 Feet (x2) Assistive device:  Rolling walker (2 wheeled) Gait Pattern/deviations: Step-through pattern;Decreased stride length;Trunk flexed General Gait Details: pt unsteady and requires A for balance and management of RW as pt tends to push it out in front of him and at times tries to reach for furniture or sink.  pt incontinent of urine upon standing and pt unaware until he has already started urinating.  pt unable to stop flow of urine and ambulated into bathromm while urinating.   Modified Rankin (Stroke Patients Only) Pre-Morbid Rankin Score: Moderate disability Modified Rankin: Moderately severe disability    Exercises     PT Diagnosis: Difficulty walking;Generalized weakness  PT Problem List: Decreased strength;Decreased activity tolerance;Decreased balance;Decreased mobility;Decreased coordination;Decreased cognition;Decreased knowledge of use of DME;Decreased safety awareness PT Treatment Interventions: DME instruction;Gait training;Stair training;Functional mobility training;Therapeutic activities;Therapeutic exercise;Balance training;Neuromuscular re-education;Cognitive remediation;Patient/family education     PT Goals(Current goals can be found in the care plan section) Acute Rehab PT Goals Patient Stated Goal: Per wife to return to home.   PT Goal Formulation: With family Time For Goal Achievement: 09/02/13 Potential to Achieve Goals: Fair  Visit Information  Last PT Received On: 08/19/13 Assistance Needed: +2 (helpful for safety and incontinence) History of Present Illness: pt presents with 3 day hx of confusion and weakness.  pt with hx of dementia.         Prior [place] expects to be discharged to:: Private residence Living Arrangements: Spouse/significant other Available Help at Discharge: Family;Available 24 hours/day Type of Home: House Home Layout: One level Home Equipment: Gilford Rile -  2 wheels;Cane - single point Additional Comments: pt's wife indicates she is  only CG for pt.   Prior Function Level of Independence: Needs assistance Gait / Transfers Assistance Needed: Uses cane ADL's / Homemaking Assistance Needed: Wife performs all homemaking and has everything set-up for pt to perform his ADLs.   Communication Communication: No difficulties    Cognition  Cognition Arousal/Alertness: Awake/alert Behavior During Therapy: Flat affect Overall Cognitive Status: Impaired/Different from baseline Area of Impairment: Orientation;Attention;Memory;Following commands;Safety/judgement;Awareness;Problem solving Orientation Level: Disoriented to;Time;Situation;Place Current Attention Level: Focused Memory: Decreased short-term memory Following Commands: Follows one step commands inconsistently Safety/Judgement: Decreased awareness of safety;Decreased awareness of deficits Awareness: Intellectual;Emergent;Anticipatory Problem Solving: Slow processing;Decreased initiation;Difficulty sequencing;Requires verbal cues;Requires tactile cues General Comments: [name] and wife indicates this is worse than baseline.      Extremity/Trunk Assessment Upper Extremity Assessment Upper Extremity Assessment: Defer to OT evaluation Lower Extremity Assessment Lower Extremity Assessment: Generalized weakness   Balance Balance Overall balance assessment: Needs assistance Standing balance support: Bilateral upper extremity supported Standing balance-Leahy Scale: Fair  End of Session PT - End of Session Equipment Utilized During Treatment: Gait belt Activity Tolerance: Patient limited by fatigue (Limited by cognition) Patient left: in chair;with call bell/phone within reach;with chair alarm set;with family/visitor present Nurse Communication: Mobility status  GP Functional Assessment Tool Used: Clinical Judgement Functional Limitation: Mobility: Walking and moving around Mobility: Walking and Moving Around Current Status [id number]): At least 20 percent but  less than 40 percent impaired, limited or restricted Mobility: Walking and Moving Around Goal Status [phone]): At least 1 percent but less than 20 percent impaired, limited or restricted   Catarina Hartshorn, [place] 08/19/2013, 10:00 AM

## 2013-08-19 NOTE — Progress Notes (Signed)
[facility] PROGRESS NOTE  Bryan Moore [id number] DOB: 1919-11-03 DOA: 08/18/2013 PCP: [name], MD  Assessment/Plan: #1 acute encephalopathy Questionable etiology. May be secondary to acute versus subacute stroke noted on CT of the head versus questionable UTI. MRI pending 2-D echo with no source of emboli. Carotid Dopplers pending.  Patient was given some IV Cipro with development of a rash. Will try a dose of IV Rocephin while awaiting culture results. [facility] consult neurology for further evaluation and management.  #2 probable/questionable UTI Discontinue IV Cipro. Try IV Rocephin. Follow.  #3 hypertension Start Norvasc 5 mg daily.  #4 anemia No overt GI bleed. Follow H&H.  #5 hyperlipidemia Check a fasting lipid panel.  #6 prophylaxis SCDs for DVT prophylaxis.  Code Status: full Family Communication: updated patient no family at bedside. Disposition Plan: pending workup home versus SNF   Consultants:  Neurology pending  Procedures:  CT head 08/18/2013  Chest x-ray 08/18/2013  2-D echo 08/19/2013  Antibiotics: . IV Cipro 08/18/2013--> 08/19/2013  HPI/Subjective: Patient per nursing with some outbursts early on. Patient is alert to self and place.per nursing patient developed a rash after Cipro was infused. Rash has since resolved.  Objective: Filed Vitals:   08/19/13 1453  BP: 178/63  Pulse: 84  Temp:   Resp: 18   No intake or output data in the 24 hours ending 08/19/13 1827 Filed Weights   08/19/13 0000  Weight: 68.8 kg (151 lb 10.8 oz)    Exam:   General:  NAD  Cardiovascular: RRR  Respiratory: CTAB  Abdomen: Soft/NT/ND/+BS  Musculoskeletal: no C/C/E.  Data Reviewed: Basic Metabolic Panel:  Recent Labs Lab 08/18/13 1819 08/18/13 1928 08/19/13 0915  NA 140 141 139  K 4.7 4.6 4.2  CL 103 104 103  CO2 25  --  23  GLUCOSE 88 89 85  BUN 28* 28* 24*  CREATININE 1.34 1.40* 1.16  CALCIUM 8.7  --  8.9    Liver Function Tests:  Recent Labs Lab 08/18/13 1819  AST 19  ALT 11  ALKPHOS 76  BILITOT 0.2*  PROT 7.4  ALBUMIN 3.5   No results found for this basename: LIPASE, AMYLASE,  in the last 168 hours  Recent Labs Lab 08/19/13 0510  AMMONIA 37   CBC:  Recent Labs Lab 08/18/13 1819 08/18/13 1928 08/19/13 0915  WBC 4.8  --  4.6  NEUTROABS 3.8  --   --   HGB 10.9* 11.6* 11.5*  HCT 32.5* 34.0* 33.2*  MCV 96.7  --  93.8  PLT 172  --  165   Cardiac Enzymes: No results found for this basename: CKTOTAL, CKMB, CKMBINDEX, TROPONINI,  in the last 168 hours BNP (last 3 results) No results found for this basename: PROBNP,  in the last 8760 hours CBG: No results found for this basename: GLUCAP,  in the last 168 hours  No results found for this or any previous visit (from the past 240 hour(s)).   Studies: Dg Chest 2 View  08/18/2013   CLINICAL DATA:  Acute mental status changes.  EXAM: CHEST  2 VIEW  COMPARISON:  One view chest x-ray 07/20/2012 and two-view chest x-ray 12/11/2011.  FINDINGS: AP erect and lateral chest again demonstrate an aortic valve prosthesis which appears appropriately oriented. Cardiac silhouette mildly enlarged but stable. Thoracic aorta mildly tortuous and atherosclerotic, unchanged. Calcified left hilar lymph nodes, unchanged. Lungs clear. Bronchovascular markings normal. Pulmonary vascularity normal. No visible pleural effusions. No pneumothorax. Degenerative changes involving the thoracic spine.  IMPRESSION: Stable mild cardiomegaly.  No acute cardiopulmonary disease.   Electronically Signed   By: Evangeline Dakin M.D.   On: 08/18/2013 19:09   Ct Head Wo Contrast  08/18/2013   CLINICAL DATA:  Altered mental status, baseline dementia, recent fall  EXAM: CT HEAD WITHOUT CONTRAST  TECHNIQUE: Contiguous axial images were obtained from the base of the skull through the vertex without intravenous contrast.  COMPARISON:  08/13/2008  FINDINGS: No evidence of  parenchymal hemorrhage or extra-axial fluid collection. No mass lesion, mass effect, or midline shift.  10 mm hypodensity in the left internal capsule (series 2/image 15), suspicious for age indeterminate lacunar infarct, new from 2010.  Subcortical white matter and periventricular small vessel ischemic changes. Mild intracranial atherosclerosis.  Global cortical atrophy.  Stable secondary ventricular prominence.  The visualized paranasal sinuses are essentially clear. The mastoid air cells are unopacified.  No evidence of calvarial fracture.  IMPRESSION: Suspected 10 mm lacunar infarct in the left internal capsule, age indeterminate, new from 2010.  Atrophy with small vessel ischemic changes.   Electronically Signed   By: Julian Hy M.D.   On: 08/18/2013 18:50    Scheduled Meds:   Continuous Infusions: . sodium chloride 75 mL/hr at 08/19/13 [id number]    Principal Problem:   Encephalopathy Active Problems:   Hypertension   Hyperlipidemia   Anemia   CVA (cerebral infarction)   UTI (urinary tract infection): Probable    Time spent: [place] MD [facility] Pager [phone]. If 7PM-7AM, please contact night-coverage at [email or web address], password [facility] 08/19/2013, 6:27 PM  LOS: 1 day

## 2013-08-19 NOTE — Progress Notes (Signed)
Echo Lab  2D Echocardiogram completed.  [place], [place] 08/19/2013 2:37 PM

## 2013-08-20 DIAGNOSIS — C679 Malignant neoplasm of bladder, unspecified: Secondary | ICD-10-CM

## 2013-08-20 LAB — BASIC METABOLIC PANEL
BUN: 21 mg/dL (ref 6–23)
CHLORIDE: 104 meq/L (ref 96–112)
CO2: 26 mEq/L (ref 19–32)
Calcium: 9 mg/dL (ref 8.4–10.5)
Creatinine, Ser: 1.2 mg/dL (ref 0.50–1.35)
GFR calc Af Amer: 59 mL/min — ABNORMAL LOW (ref >90)
GFR calc non Af Amer: 51 mL/min — ABNORMAL LOW (ref >90)
Glucose, Bld: 95 mg/dL (ref 70–99)
Potassium: 4.7 mEq/L (ref 3.7–5.3)
SODIUM: 142 meq/L (ref 137–147)

## 2013-08-20 LAB — LIPID PANEL
CHOLESTEROL: 162 mg/dL (ref 0–200)
HDL: 53 mg/dL (ref >39)
LDL Cholesterol: 100 mg/dL — ABNORMAL HIGH (ref 0–99)
Total CHOL/HDL Ratio: 3.1 RATIO
Triglycerides: 47 mg/dL (ref <150)
VLDL: 9 mg/dL (ref 0–40)

## 2013-08-20 LAB — CBC
HEMATOCRIT: 33.1 % — AB (ref 39.0–52.0)
HEMOGLOBIN: 11.1 g/dL — AB (ref 13.0–17.0)
MCH: 32.3 pg (ref 26.0–34.0)
MCHC: 33.5 g/dL (ref 30.0–36.0)
MCV: 96.2 fL (ref 78.0–100.0)
Platelets: 151 10*3/uL (ref 150–400)
RBC: 3.44 MIL/uL — AB (ref 4.22–5.81)
RDW: 14.7 % (ref 11.5–15.5)
WBC: 5.9 10*3/uL (ref 4.0–10.5)

## 2013-08-20 MED ORDER — AMLODIPINE BESYLATE 10 MG PO TABS
10.0000 mg | ORAL_TABLET | Freq: Every day | ORAL | Status: DC
  Administered 2013-08-20 – 2013-08-21 (×2): 10 mg via ORAL
  Filled 2013-08-20 (×2): qty 1

## 2013-08-20 MED ORDER — LORAZEPAM 2 MG/ML IJ SOLN
0.5000 mg | Freq: Once | INTRAMUSCULAR | Status: AC
  Administered 2013-08-20: 0.5 mg via INTRAVENOUS
  Filled 2013-08-20: qty 1

## 2013-08-20 MED ORDER — ATORVASTATIN CALCIUM 10 MG PO TABS
10.0000 mg | ORAL_TABLET | Freq: Every day | ORAL | Status: DC
  Administered 2013-08-21 – 2013-08-23 (×3): 10 mg via ORAL
  Filled 2013-08-20 (×4): qty 1

## 2013-08-20 MED ORDER — HALOPERIDOL 0.5 MG PO TABS: 0.5000 mg | ORAL_TABLET | Freq: Three times a day (TID) | ORAL | Status: DC | PRN

## 2013-08-20 MED ORDER — AMLODIPINE BESYLATE 5 MG PO TABS
5.0000 mg | ORAL_TABLET | Freq: Every day | ORAL | Status: DC
  Filled 2013-08-20: qty 1

## 2013-08-20 MED ORDER — AMLODIPINE BESYLATE 5 MG PO TABS: 5.0000 mg | ORAL_TABLET | Freq: Every day | ORAL | Status: DC

## 2013-08-20 MED ORDER — LORAZEPAM 2 MG/ML IJ SOLN
0.2500 mg | Freq: Four times a day (QID) | INTRAMUSCULAR | Status: DC | PRN
  Administered 2013-08-20: 0.25 mg via INTRAVENOUS
  Filled 2013-08-20: qty 1

## 2013-08-20 MED ORDER — AMLODIPINE BESYLATE 10 MG PO TABS: 10.0000 mg | ORAL_TABLET | Freq: Every day | ORAL | Status: DC

## 2013-08-20 NOTE — Progress Notes (Signed)
Patient ID: Bryan Moore, male   DOB: May 21, 1920, 80 y.o.   MRN: [id number]       Bryan Moore is tentatively scheduled for 1/22 for a TUR bladder tumor for recurrent bladder cancer.  He has had negative urine cultures on 1/7 and 1/13 so UTI is not an issue.  He has TNTC RBC's on UA from his bladder tumors.     Please let me know if he will be medically fit for surgery on 1/22, although it would appear doubtful at this time.   I would prefer not to delay the procedure any longer than necessary because he has rapidly recurring bladder cancer with a history of superficial invasion.    Dr. [name]

## 2013-08-20 NOTE — Progress Notes (Signed)
[facility] PROGRESS NOTE  Bryan Moore [id number] DOB: 12-09-1919 DOA: 08/18/2013 PCP: [name], MD  Assessment/Plan: #1 acute encephalopathy/subacute CVA likely secondary to acute versus subacute stroke noted on CT of the head and MRI of the head in the setting of underlying dementia. Urine cultures are negative. 2-D echo with no source of emboli. Carotid Dopplers pending.  Patient unable to tolerate aspirin secondary to prior history of GI bleed requiring multiple transfusions. PT/OT/ST. Risk factor modification including blood pressure control. Will start patient on a statin. Neurology is following and appreciate input and recommendations.  #2 Bacteruria Urine cultures are negative. Discontinue antibiotics.  #3 hypertension Increase Norvasc to 10 mg daily. Start hydralazine 25 mg 3 times daily.  #4 anemia No overt GI bleed. H&H stable.Follow H&H.  #5 hyperlipidemia LDL is 100. Will place on a statin.  #6 dementia Stable. Outpatient follow up. Patient with significant lethargy after Ativan for agitation. Will monitor for now. Will discontinue Ativan.  #7 history of recurrent bladder cancer Patient noted to have too numerous to count RBCs on UA likely from bladder tumors. Patient is scheduled for TUR bladder tumor on 08/27/2013. Will discuss with neurology as to whether patient is stable for this surgery on 08/27/2013 as per urology they would prefer not to delay the procedure any longer secondary to rapidly recurrent bladder cancer with a history of superficial invasion.  #8 prophylaxis SCDs for DVT prophylaxis.  Code Status: full Family Communication: updated wife at bedside. Disposition Plan: pending workup home versus SNF   Consultants:  Neurology Dr. Doy Mince: 08/19/2013  Procedures:  CT head 08/18/2013  Chest x-ray 08/18/2013  2-D echo 08/19/2013  MRI head 08/19/2013  Antibiotics: . IV Cipro 08/18/2013--> 08/19/2013 IV Rocephin  08/19/2013--> 08/20/2013  HPI/Subjective: Patient currently lethargic is to receive some Ativan post MRI last night.  Objective: Filed Vitals:   08/20/13 0133  BP: 173/79  Pulse: 105  Temp: 97.4 F (36.3 C)  Resp: 16   No intake or output data in the 24 hours ending 08/20/13 1109 Filed Weights   08/19/13 0000  Weight: 68.8 kg (151 lb 10.8 oz)    Exam:   General:  NAD  Cardiovascular: RRR  Respiratory: CTAB  Abdomen: Soft/NT/ND/+BS  Musculoskeletal: no C/C/E.  Data Reviewed: Basic Metabolic Panel:  Recent Labs Lab 08/18/13 1819 08/18/13 1928 08/19/13 0915 08/20/13 0350  NA 140 141 139 142  K 4.7 4.6 4.2 4.7  CL 103 104 103 104  CO2 25  --  23 26  GLUCOSE 88 89 85 95  BUN 28* 28* 24* 21  CREATININE 1.34 1.40* 1.16 1.20  CALCIUM 8.7  --  8.9 9.0   Liver Function Tests:  Recent Labs Lab 08/18/13 1819  AST 19  ALT 11  ALKPHOS 76  BILITOT 0.2*  PROT 7.4  ALBUMIN 3.5   No results found for this basename: LIPASE, AMYLASE,  in the last 168 hours  Recent Labs Lab 08/19/13 0510  AMMONIA 37   CBC:  Recent Labs Lab 08/18/13 1819 08/18/13 1928 08/19/13 0915 08/20/13 0350  WBC 4.8  --  4.6 5.9  NEUTROABS 3.8  --   --   --   HGB 10.9* 11.6* 11.5* 11.1*  HCT 32.5* 34.0* 33.2* 33.1*  MCV 96.7  --  93.8 96.2  PLT 172  --  165 151   Cardiac Enzymes: No results found for this basename: CKTOTAL, CKMB, CKMBINDEX, TROPONINI,  in the last 168 hours BNP (last 3 results) No results  found for this basename: PROBNP,  in the last 8760 hours CBG: No results found for this basename: GLUCAP,  in the last 168 hours  Recent Results (from the past 240 hour(s))  URINE CULTURE     Status: None   Collection Time    08/18/13  7:37 PM      Result Value Range Status   Specimen Description URINE, CATHETERIZED   Final   Special Requests NONE   Final   Culture  Setup Time     Final   Value: 08/19/2013 01:01     Performed at [facility]      Final   Value: NO GROWTH     Performed at [facility]   Culture     Final   Value: NO GROWTH     Performed at [facility]   Report Status 08/19/2013 FINAL   Final     Studies: Dg Chest 2 View  08/18/2013   CLINICAL DATA:  Acute mental status changes.  EXAM: CHEST  2 VIEW  COMPARISON:  One view chest x-ray 07/20/2012 and two-view chest x-ray 12/11/2011.  FINDINGS: AP erect and lateral chest again demonstrate an aortic valve prosthesis which appears appropriately oriented. Cardiac silhouette mildly enlarged but stable. Thoracic aorta mildly tortuous and atherosclerotic, unchanged. Calcified left hilar lymph nodes, unchanged. Lungs clear. Bronchovascular markings normal. Pulmonary vascularity normal. No visible pleural effusions. No pneumothorax. Degenerative changes involving the thoracic spine.  IMPRESSION: Stable mild cardiomegaly.  No acute cardiopulmonary disease.   Electronically Signed   By: Evangeline Dakin M.D.   On: 08/18/2013 19:09   Ct Head Wo Contrast  08/18/2013   CLINICAL DATA:  Altered mental status, baseline dementia, recent fall  EXAM: CT HEAD WITHOUT CONTRAST  TECHNIQUE: Contiguous axial images were obtained from the base of the skull through the vertex without intravenous contrast.  COMPARISON:  08/13/2008  FINDINGS: No evidence of parenchymal hemorrhage or extra-axial fluid collection. No mass lesion, mass effect, or midline shift.  10 mm hypodensity in the left internal capsule (series 2/image 15), suspicious for age indeterminate lacunar infarct, new from 2010.  Subcortical white matter and periventricular small vessel ischemic changes. Mild intracranial atherosclerosis.  Global cortical atrophy.  Stable secondary ventricular prominence.  The visualized paranasal sinuses are essentially clear. The mastoid air cells are unopacified.  No evidence of calvarial fracture.  IMPRESSION: Suspected 10 mm lacunar infarct in the left internal capsule, age indeterminate,  new from 2010.  Atrophy with small vessel ischemic changes.   Electronically Signed   By: Julian Hy M.D.   On: 08/18/2013 18:50   Mr Brain Wo Contrast  08/19/2013   CLINICAL DATA:  Stroke.  2-3 days of worsening confusion.  EXAM: MRI HEAD WITHOUT CONTRAST  MRA HEAD WITHOUT CONTRAST  TECHNIQUE: Multiplanar, multiecho pulse sequences of the brain and surrounding structures were obtained without intravenous contrast. Angiographic images of the head were obtained using MRA technique without contrast.  COMPARISON:  CT head without contrast 08/18/2013.  FINDINGS: MRI HEAD FINDINGS  The diffusion-weighted images confirm an acute nonhemorrhagic infarct involving the left globus pallidus and medial basal ganglia. This infarct measures 18 mm maximally. Associated T2 signal changes are noted.  Moderate generalized atrophy is present. Periventricular and subcortical white matter changes are noted bilaterally. Flow is present in the major intracranial arteries. The patient is status post bilateral lens extractions. The ventricles are proportionate to the degree of atrophy. No significant extra-axial fluid collection  is present. Mild degenerative anterolisthesis is present at C3-4.  The patient is status post bilateral lens extractions. Mild mucosal thickening is evident throughout the anterior paranasal sinuses. The sphenoid sinuses are clear. The mastoid air cells are clear.  MRA HEAD FINDINGS  The MRI a is mildly degraded by patient motion. Atherosclerotic changes are noted in the cavernous carotid arteries bilaterally without significant stenosis. The A1 and M1 segments appear to be within normal limits. The MCA bifurcations are intact. Mild small vessel disease is present bilaterally.  The right vertebral artery is slightly dominant to the left. The right PICA origin is visualized and normal. The left PICA is not visualized. The basilar artery is within normal limits. Both posterior cerebral arteries originate  from the basilar tip. There is some attenuation of distal PCA branch vessels bilaterally, left greater than right.  IMPRESSION: 1. A subacute nonhemorrhagic infarct involving the medial left basal ganglia measures 18 mm maximally. T2 signal changes are compatible with the subacute time frame. 2. Moderate generalized atrophy and white matter disease. 3. Mild anterior sinus disease. 4. Atherosclerotic changes within the cavernous carotid arteries without stenosis. 5. Mild diffuse small vessel disease. This likely reflects the sequelae of chronic microvascular ischemia.   Electronically Signed   By: Lawrence Santiago M.D.   On: 08/19/2013 21:15   Mr Jodene Nam Head/brain Wo Cm  08/19/2013   CLINICAL DATA:  Stroke.  2-3 days of worsening confusion.  EXAM: MRI HEAD WITHOUT CONTRAST  MRA HEAD WITHOUT CONTRAST  TECHNIQUE: Multiplanar, multiecho pulse sequences of the brain and surrounding structures were obtained without intravenous contrast. Angiographic images of the head were obtained using MRA technique without contrast.  COMPARISON:  CT head without contrast 08/18/2013.  FINDINGS: MRI HEAD FINDINGS  The diffusion-weighted images confirm an acute nonhemorrhagic infarct involving the left globus pallidus and medial basal ganglia. This infarct measures 18 mm maximally. Associated T2 signal changes are noted.  Moderate generalized atrophy is present. Periventricular and subcortical white matter changes are noted bilaterally. Flow is present in the major intracranial arteries. The patient is status post bilateral lens extractions. The ventricles are proportionate to the degree of atrophy. No significant extra-axial fluid collection is present. Mild degenerative anterolisthesis is present at C3-4.  The patient is status post bilateral lens extractions. Mild mucosal thickening is evident throughout the anterior paranasal sinuses. The sphenoid sinuses are clear. The mastoid air cells are clear.  MRA HEAD FINDINGS  The MRI a is  mildly degraded by patient motion. Atherosclerotic changes are noted in the cavernous carotid arteries bilaterally without significant stenosis. The A1 and M1 segments appear to be within normal limits. The MCA bifurcations are intact. Mild small vessel disease is present bilaterally.  The right vertebral artery is slightly dominant to the left. The right PICA origin is visualized and normal. The left PICA is not visualized. The basilar artery is within normal limits. Both posterior cerebral arteries originate from the basilar tip. There is some attenuation of distal PCA branch vessels bilaterally, left greater than right.  IMPRESSION: 1. A subacute nonhemorrhagic infarct involving the medial left basal ganglia measures 18 mm maximally. T2 signal changes are compatible with the subacute time frame. 2. Moderate generalized atrophy and white matter disease. 3. Mild anterior sinus disease. 4. Atherosclerotic changes within the cavernous carotid arteries without stenosis. 5. Mild diffuse small vessel disease. This likely reflects the sequelae of chronic microvascular ischemia.   Electronically Signed   By: Lawrence Santiago M.D.   On: 08/19/2013  21:15    Scheduled Meds: . amLODipine  5 mg Oral Daily  . cefTRIAXone (ROCEPHIN)  IV  1 g Intravenous Q24H   Continuous Infusions: . sodium chloride 75 mL/hr at 08/19/13 [id number]    Principal Problem:   Encephalopathy Active Problems:   Hypertension   Hyperlipidemia   Anemia   CVA (cerebral infarction)   UTI (urinary tract infection): Probable    Time spent: Townsend MD [facility] Pager [phone]. If 7PM-7AM, please contact night-coverage at [email or web address], password [facility] 08/20/2013, 11:09 AM  LOS: 2 days

## 2013-08-20 NOTE — Progress Notes (Signed)
Patient changed to inpatient r/t requiring IVF @ 75cc/hr and IV antibiotics.

## 2013-08-20 NOTE — Evaluation (Signed)
Speech Language Pathology Evaluation Patient Details Name: Bryan Moore MRN: [id number] DOB: Dec 26, 1919 Today's Date: 08/20/2013 Time: 1013-1027 SLP Time Calculation (min): 14 min  Problem List:  Patient Active Problem List   Diagnosis Date Noted  . CVA (cerebral infarction) 08/19/2013  . UTI (urinary tract infection): Probable 08/19/2013  . Encephalopathy 08/18/2013  . Bladder cancer 04/15/2013  . 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 Frequent falls 07/17/2012  . Fall 12/17/2011  . Atypical chest pain 12/10/2011  . Aortic valve disease 03/28/2011  . Hypertension 03/28/2011  . Hyperlipidemia 03/28/2011  . Anemia 03/28/2011   Past Medical History:  Past Medical History  Diagnosis Date  . Anemia     from AVM's. Requiring transfusion  . Hyperlipemia   . Aortic stenosis     s/p TAVI July 2012  . Dementia   . Glaucoma   . Mitral regurgitation   . Shingles May 2013    right arm residual pain- last outbreak 1 1/2 yrs ago.  Marland Kitchen Hypertension     Not taking Lisinopril due to BP drops low.  . Transfusion history     last 2 yrs ago.  Marland Kitchen GERD (gastroesophageal reflux disease)     RARE - AND NO MEDS  . Cancer     BLADDER CANCER  . Arthritis   . Balance problem     WEAK ON RIGHT SIDE ( SCOLISOSIS AND HX OF A BACK SURGERY) - BALANCE PROBLEM - "FALLS BACKWARD" - FREQUENT FALLS.  USES CANE WHEN AMBULATING  . Complication of anesthesia     VERY DROWSY FOR HOURS AFTER SURGERY - AND DISORIENTATION  . Heart murmur     PT'S CARDIOLOGIST IS DR. Acie Fredrickson -LAST OFFICE VISIT 03/02/13 IN EPIC   Past Surgical History:  Past Surgical History  Procedure Laterality Date  . Appendectomy    . Cardiac catheterization  10/23/2010    ef 65%  . US echocardiography  08/31/10    EF 55-60%  . Cardiovascular stress test  01/01/2007    EF 64%, NO EVIDENCE OF ISCHEMIA  . Cardiac valve replacement      02/2011([facility])  . Transurethral resection of bladder tumor with gyrus (turbt-gyrus) N/A 04/13/2013     Procedure: TRANSURETHRAL RESECTION OF BLADDER TUMOR WITH GYRUS (TURBT-GYRUS);  Surgeon: [name], MD;  Location: [facility];  Service: Urology;  Laterality: N/A;  . Cystoscopy with stent placement Left 04/13/2013    Procedure:  LEFT URETERAL STENT PLACEMENT;  Surgeon: [name], MD;  Location: [facility];  Service: Urology;  Laterality: Left;  . Back surgery  2010 OR 2011  . Transurethral resection of bladder tumor N/A 06/04/2013    Procedure: RESTAGING TRANSURETHRAL RESECTION OF BLADDER TUMOR (TURBT), URETERAL CATHERIZATION ;  Surgeon: [name], MD;  Location: [facility];  Service: Urology;  Laterality: N/A;  CYSTO RESTAGING TURBT      HPI:  80 y.o. year old male with multiple medical problems including dementia, HTN, aortic valve disease s/p AVR, recurrent GI bleeds 2/2 AVMs, frequent falls, bladder cancer presenting with confusion x 2-3 days. Level V caveat as pt is acutely confused.  Per wife, pt became acutely confused on Sunday. Wife states that pt is otherwise alert and able to perform his ADLs at baseline. Pt has been having intermittent outbursts as well as asking for deceased relatives. Wife denies any recent illnesses. Pt did reportedly fall in the shower on christmas, but there was no direct head trauma. Wife denies any overt hemiparesis, though she does  report mild worsening of R sided weakness since Sunday.  MRI revealed a subacute nonhemorrhagic infarct involving the medial left basal ganglia measures 18 mm maximally.    Assessment / Plan / Recommendation Clinical Impression  Pt. agitated last night and given sedatives, therefore somnolent. Total verbal and tactile stimuli given and initially jerks when touched.  Pt. opened eyes once and verbalized one word.  Wife reports he was begining to have mild dementia prior to admission but driving, performed ADL's, mild occassional memory difficulties (significantly declined per wife presently).  Medical diagnosis is subacute CVA and UTI both  responsible for current lethargy and behavior.  ST will continue to see and diagnostically assess in therapy as he becomes more alert.      SLP Assessment  Patient needs continued Speech Lanaguage Pathology Services    Follow Up Recommendations   (TBD)    Frequency and Duration min 2x/week  2 weeks   Pertinent Vitals/Pain WDL   SLP Goals  SLP Goals Potential to Achieve Goals: Good Potential Considerations: Co-morbidities  SLP Evaluation Prior Functioning  Cognitive/Linguistic Baseline: Baseline deficits Baseline deficit details:  (memiry) Type of Home: House  Lives With: Spouse Available Help at Discharge: Family;Available 24 hours/day Vocation: Retired   Associate Professor  Overall Cognitive Status: Impaired/Different from baseline Arousal/Alertness: Lethargic Orientation Level:  (Unable to assess) Attention: Focused Focused Attention: Impaired Focused Attention Impairment: Verbal basic;Functional basic Memory:  (TBA) Awareness:  (TBA) Problem Solving:  (TBA) Safety/Judgment:  (TBA)    Comprehension  Auditory Comprehension Overall Auditory Comprehension:  (will assess once alert) Interfering Components: Attention Visual Recognition/Discrimination Discrimination: Not tested Reading Comprehension Reading Status:  (TBA)    Expression Expression Primary Mode of Expression: Verbal Verbal Expression Overall Verbal Expression:  (will assess further) Initiation: Impaired Written Expression Written Expression:  (TBA)   Oral / Motor Oral Motor/Sensory Function Overall Oral Motor/Sensory Function:  (TBA) Motor Speech Overall Motor Speech:  (TBA)   GO     Orbie Pyo Caeleb Batalla M.Ed [employer] [phone]  08/20/2013

## 2013-08-20 NOTE — Progress Notes (Signed)
Stroke Team Progress Note  HISTORY Bryan Moore is an 80 y.o. male with underlying dementia who was brought to the hospital due to 2-3 days of worsening confusion. Majority of history is obtained from chart as patient is unable to provide. Per note he he fell on christmas but had no deficits from his fall Wife noted increased weakness on the right side since Sunday. Wife has also noted increased urinary frequency. Pateint was brought to the ED 08/18/2013, due to increasing confusion. While hospitalized CT head was obtained showing left lacunar infarct in the internal capsule of indeterminate age. Neurology was consulted regarding infarct.  Of note--Allergie to ASA which caused GI bleed in the past  Patient was not a TPA candidate secondary to delay in arrival.   SUBJECTIVE His wife is at the bedside.  Overall she feels his condition is gradually worsening. Wife has lots of questions and comments related to pt's condition.  OBJECTIVE Most recent Vital Signs: Filed Vitals:   08/19/13 1048 08/19/13 1453 08/19/13 2127 08/20/13 0133  BP: 161/63 178/63 154/70 173/79  Pulse: 65 84 84 105  Temp: 97.4 F (36.3 C)  97.5 F (36.4 C) 97.4 F (36.3 C)  TempSrc: Oral  Axillary Oral  Resp: 18 18 22 16   Height:      Weight:      SpO2: 100% 99% 99%    CBG (last 3)  No results found for this basename: GLUCAP,  in the last 72 hours  IV Fluid Intake:   . sodium chloride 75 mL/hr at 08/19/13 0953    MEDICATIONS  . amLODipine  5 mg Oral Daily  . cefTRIAXone (ROCEPHIN)  IV  1 g Intravenous Q24H   PRN:  diphenhydrAMINE, hydrALAZINE, senna-docusate  Diet:  Cardiac thin liquids Activity:    DVT Prophylaxis:  SCDs ordered, not on pt  CLINICALLY SIGNIFICANT STUDIES Basic Metabolic Panel:   Recent Labs Lab 08/19/13 0915 08/20/13 0350  NA 139 142  K 4.2 4.7  CL 103 104  CO2 23 26  GLUCOSE 85 95  BUN 24* 21  CREATININE 1.16 1.20  CALCIUM 8.9 9.0   Liver Function Tests:   Recent  Labs Lab 08/18/13 1819  AST 19  ALT 11  ALKPHOS 76  BILITOT 0.2*  PROT 7.4  ALBUMIN 3.5   CBC:  Recent Labs Lab 08/18/13 1819  08/19/13 0915 08/20/13 0350  WBC 4.8  --  4.6 5.9  NEUTROABS 3.8  --   --   --   HGB 10.9*  < > 11.5* 11.1*  HCT 32.5*  < > 33.2* 33.1*  MCV 96.7  --  93.8 96.2  PLT 172  --  165 151  < > = values in this interval not displayed. Coagulation: No results found for this basename: LABPROT, INR,  in the last 168 hours Cardiac Enzymes: No results found for this basename: CKTOTAL, CKMB, CKMBINDEX, TROPONINI,  in the last 168 hours Urinalysis:   Recent Labs Lab 08/18/13 1937  COLORURINE YELLOW  LABSPEC 1.018  PHURINE 6.0  GLUCOSEU NEGATIVE  HGBUR LARGE*  BILIRUBINUR NEGATIVE  KETONESUR 15*  PROTEINUR NEGATIVE  UROBILINOGEN 0.2  NITRITE NEGATIVE  LEUKOCYTESUR SMALL*   Lipid Panel    Component Value Date/Time   CHOL 162 08/20/2013 0350   TRIG 47 08/20/2013 0350   HDL 53 08/20/2013 0350   CHOLHDL 3.1 08/20/2013 0350   VLDL 9 08/20/2013 0350   LDLCALC 100* 08/20/2013 0350   HgbA1C  Lab Results  Component Value  Date   HGBA1C 5.2 08/19/2013    Urine Drug Screen:   No results found for this basename: labopia,  cocainscrnur,  labbenz,  amphetmu,  thcu,  labbarb    Alcohol Level: No results found for this basename: ETH,  in the last 168 hours   CT of the brain  08/18/2013    Suspected 10 mm lacunar infarct in the left internal capsule, age indeterminate, new from 2010.  Atrophy with small vessel ischemic changes.    MRI of the brain  08/19/2013   1. A subacute nonhemorrhagic infarct involving the medial left basal ganglia measures 18 mm maximally. T2 signal changes are compatible with the subacute time frame. 2. Moderate generalized atrophy and white matter disease. 3. Mild anterior sinus disease.   MRA of the brain  08/19/2013    4. Atherosclerotic changes within the cavernous carotid arteries without stenosis. 5. Mild diffuse small vessel disease.  This likely reflects the sequelae of chronic microvascular ischemia.   2D Echocardiogram    Carotid Doppler    CXR  08/18/2013   Stable mild cardiomegaly.  No acute cardiopulmonary disease.     EKG  normal sinus rhythm.   Therapy Recommendations   Physical Exam   Patient sedated from agitation last night.  Afebrile. Head is nontraumatic. Neck is supple without bruit.   Cardiac exam no murmur or gallop. Lungs are clear to auscultation. Distal pulses are well felt. Neurological Exam : Stuporous not following commands. Can be barely aroused response to noxious stimuli by moaning and groaning and speech is nonsensical. Moves all 4 extremities purposefully against gravity and localizes to pain. No focal weakness. Deep tendon reflexes are symmetric. Plantars are downgoing. ASSESSMENT Bryan Moore is a 80 y.o. male presenting with 2-3 days of worsening confusion. Imaging confirms a left basal ganglia infarct. Infarct felt to be thrombotic secondary to small vessel disease.  On no antithrombotics prior to admission. Now on no antithrombotics for secondary stroke prevention given hx GIB on antiplatelets. Patient with resultant lethargy (post ativan for MRI yesterday), increased confusion, right hemiparesis. Work up underway.  hypertension Hyperlipidemia, LDL 100, on no statin PTA, now on no statin, goal LDL < 100 (< 70 for diabetics) Aortic stenosis Dementia MR Hx bladder cancer Hx GIB on aspirin and plavix, required hospitalization and ongoing blood transfusions post op. Greater than 1 year since his last transfusion.  Hospital day # 2  TREATMENT/PLAN  No antithrombotics for this patient due to hx severe GIB and ongoing needs for transfusion when on aspirin due to what sounds like AVMs  Avoid sedation  Complete stroke workup - f/u 2d, CD  Therapy evals  Dr. [name] discussed diagnosis, prognosis,  treatment options and plan of care with wife and Dr. Grandville Silos.    Burnetta Sabin, MSN,  RN, ANVP-BC, ANP-BC, [facility] Pager: [phone] 08/20/2013 10:53 AM  I have personally obtained a history, examined the patient, evaluated imaging results, and formulated the assessment and plan of care. I agree with the above. Antony Contras, MD

## 2013-08-20 NOTE — Progress Notes (Signed)
Occupational Therapy Evaluation Patient Details Name: Bryan Moore MRN: [id number] DOB: [birth year] Today's Date: 08/20/2013 Time: [id number] OT Time Calculation (min): 24 min  OT Assessment / Plan / Recommendation History of present illness pt presents with 3 day hx of confusion and weakness.  pt with hx of dementia.   CT head was obtained showing left lacunar infarct in the internal capsule of indeterminate age    Clinical Impression   PTA, pt independent with self care and mod i with mobility until this past Sunday when pt became more confused. Wife states he suddenly couldn't figure ot how to clean his dentures, etc. Pt very lethargic during assessment. However, given PLOF, feel CIR consult may be appropriate. Will further assess D/C recommendations tomorrow.     OT Assessment  Patient needs continued OT Services    Follow Up Recommendations  CIR    Barriers to Discharge      Equipment Recommendations  3 in 1 bedside comode    Recommendations for Other Services Rehab consult  Frequency  Min 3X/week    Precautions / Restrictions Precautions Precautions: Fall Restrictions Weight Bearing Restrictions: No   Pertinent Vitals/Pain no apparent distress     ADL  ADL Comments: total A at this time due to cognition/lethargy    OT Diagnosis: Generalized weakness;Cognitive deficits;Altered mental status  OT Problem List: Decreased strength;Decreased activity tolerance;Impaired balance (sitting and/or standing);Decreased cognition;Decreased safety awareness;Decreased knowledge of use of DME or AE;Decreased knowledge of precautions OT Treatment Interventions: Self-care/ADL training;Therapeutic exercise;Neuromuscular education;Energy conservation;DME and/or AE instruction;Therapeutic activities;Cognitive remediation/compensation;Patient/family education;Balance training   OT Goals(Current goals can be found in the care plan section) Acute Rehab OT Goals Patient Stated Goal: Per  wife to return to home.   OT Goal Formulation: With family Time For Goal Achievement: 09/03/13 Potential to Achieve Goals: Good  Visit Information  Last OT Received On: 08/20/13 Assistance Needed: +2 (helpful for safety and incontinence) History of Present Illness: pt presents with 3 day hx of confusion and weakness.  pt with hx of dementia.         Prior [name] expects to be discharged to:: Private residence Living Arrangements: Spouse/significant other Available Help at Discharge: Family;Available 24 hours/day Type of Home: House Home Access: Stairs to enter [employer] of Steps: 3 Entrance Stairs-Rails: Right Home Layout: Two level;Able to live on main level with bedroom/bathroom Home Equipment: Gilford Rile - 2 wheels;Cane - single point;Shower seat - built in Additional Comments: pt's wife indicates she is only CG for pt.    Lives With: Spouse Prior Function Level of Independence: Needs assistance Gait / Transfers Assistance Needed: Uses cane ADL's / Homemaking Assistance Needed: Wife performs all homemaking and has everything set-up for pt to perform his ADLs.   Communication Communication: No difficulties         Vision/Perception Vision - History Baseline Vision: Other (comment) Visual History:  (will assess) Perception Perception: Not tested Praxis Praxis: Impaired Praxis Impairment Details: Motor planning;Perseveration;Initiation   Cognition  Cognition Arousal/Alertness: Awake/alert Behavior During Therapy: Flat affect Overall Cognitive Status: Impaired/Different from baseline Area of Impairment: Orientation;Attention;Memory;Following commands;Safety/judgement;Awareness;Problem solving Orientation Level: Disoriented to;Time;Situation;Place Current Attention Level: Focused Memory: Decreased short-term memory Following Commands: Follows one step commands inconsistently Safety/Judgement: Decreased awareness of  safety;Decreased awareness of deficits Awareness: Intellectual;Emergent;Anticipatory Problem Solving: Slow processing;Decreased initiation;Difficulty sequencing;Requires verbal cues;Requires tactile cues General Comments: [name] and wife indicates this is worse than baseline.      Extremity/Trunk Assessment  Upper Extremity Assessment Upper Extremity Assessment: Difficult to assess due to impaired cognition Lower Extremity Assessment Lower Extremity Assessment: Generalized weakness Cervical / Trunk Assessment Cervical / Trunk Assessment: Kyphotic     Mobility Bed Mobility Overal bed mobility: Needs Assistance Bed Mobility: Supine to Sit Supine to sit: Mod assist General bed mobility comments: cues for initiation and safe technique.  pt needs A to bring trunk up to sitting.   Transfers General transfer comment: unable to assess due to lethargy     Exercise     Balance Balance Overall balance assessment: Needs assistance Sitting-balance support: Feet supported;Bilateral upper extremity supported Sitting balance-Leahy Scale: Fair   End of Session OT - End of Session Activity Tolerance: Patient limited by lethargy Patient left: in bed;with call bell/phone within reach;with bed alarm set;with family/visitor present Nurse Communication: Mobility status  GO     Macallister Ashmead,HILLARY 08/20/2013, 5:55 PM [facility], OTR/L  [phone] 08/20/2013

## 2013-08-21 DIAGNOSIS — E785 Hyperlipidemia, unspecified: Secondary | ICD-10-CM

## 2013-08-21 LAB — BASIC METABOLIC PANEL
BUN: 17 mg/dL (ref 6–23)
CALCIUM: 8.9 mg/dL (ref 8.4–10.5)
CO2: 20 meq/L (ref 19–32)
Chloride: 101 mEq/L (ref 96–112)
Creatinine, Ser: 1.09 mg/dL (ref 0.50–1.35)
GFR calc Af Amer: 66 mL/min — ABNORMAL LOW (ref >90)
GFR calc non Af Amer: 57 mL/min — ABNORMAL LOW (ref >90)
GLUCOSE: 89 mg/dL (ref 70–99)
Potassium: 4.1 mEq/L (ref 3.7–5.3)
SODIUM: 137 meq/L (ref 137–147)

## 2013-08-21 MED ORDER — CYANOCOBALAMIN 250 MCG PO TABS
250.0000 ug | ORAL_TABLET | Freq: Every morning | ORAL | Status: DC
  Administered 2013-08-21 – 2013-08-24 (×4): 250 ug via ORAL
  Filled 2013-08-21 (×4): qty 1

## 2013-08-21 MED ORDER — DORZOLAMIDE HCL-TIMOLOL MAL 2-0.5 % OP SOLN
1.0000 [drp] | Freq: Two times a day (BID) | OPHTHALMIC | Status: DC
  Administered 2013-08-21 – 2013-08-24 (×7): 1 [drp] via OPHTHALMIC
  Filled 2013-08-21: qty 10

## 2013-08-21 MED ORDER — AMLODIPINE BESYLATE 5 MG PO TABS
5.0000 mg | ORAL_TABLET | Freq: Every day | ORAL | Status: DC
  Administered 2013-08-22 – 2013-08-24 (×3): 5 mg via ORAL
  Filled 2013-08-21 (×3): qty 1

## 2013-08-21 MED ORDER — POLYSACCHARIDE IRON COMPLEX 150 MG PO CAPS
150.0000 mg | ORAL_CAPSULE | Freq: Every morning | ORAL | Status: DC
  Administered 2013-08-21 – 2013-08-24 (×4): 150 mg via ORAL
  Filled 2013-08-21 (×5): qty 1

## 2013-08-21 MED ORDER — LATANOPROST 0.005 % OP SOLN
1.0000 [drp] | Freq: Every day | OPHTHALMIC | Status: DC
  Administered 2013-08-21 – 2013-08-23 (×3): 1 [drp] via OPHTHALMIC
  Filled 2013-08-21: qty 2.5

## 2013-08-21 MED ORDER — BRIMONIDINE TARTRATE 0.15 % OP SOLN
1.0000 [drp] | Freq: Two times a day (BID) | OPHTHALMIC | Status: DC
  Administered 2013-08-21 – 2013-08-24 (×7): 1 [drp] via OPHTHALMIC
  Filled 2013-08-21: qty 5

## 2013-08-21 MED ORDER — ADULT MULTIVITAMIN W/MINERALS CH
1.0000 | ORAL_TABLET | Freq: Every morning | ORAL | Status: DC
  Administered 2013-08-21 – 2013-08-24 (×4): 1 via ORAL
  Filled 2013-08-21 (×5): qty 1

## 2013-08-21 NOTE — Progress Notes (Signed)
VASCULAR LAB PRELIMINARY  PRELIMINARY  PRELIMINARY  PRELIMINARY  Carotid duplex  completed.    Preliminary report:  Bilateral:  1-39% ICA stenosis.  Vertebral artery flow is antegrade.      Sharyn Brilliant, RVT 08/21/2013, 2:27 PM

## 2013-08-21 NOTE — Progress Notes (Signed)
OT Cancellation Note  Patient Details Name: Bryan Moore MRN: [id number] DOB: 09-May-1920   Cancelled Treatment:    Reason Eval/Treat Not Completed: Patient at procedure or test/ unavailable (vascular lab)  [place], OTR/L  [phone] 08/21/2013 08/21/2013, 2:08 PM

## 2013-08-21 NOTE — Progress Notes (Signed)
Physical Therapy Treatment Patient Details Name: Bryan Moore MRN: [id number] DOB: 13-Apr-1920 Today's Date: 08/21/2013 Time: [id number] PT Time Calculation (min): 25 min  PT Assessment / Plan / Recommendation  History of Present Illness pt presents with 3 day hx of confusion and weakness.  pt with hx of dementia.     PT Comments   Pt generally unsteady and seems weaker today than previous session.  Wife indicates that pt has not been out of bed since this PT saw him on 08/19/13.  Encouraged wife to request Nsg to A pt to chair daily.  At this time wife is realizing that she can not take him home as she was hoping on eval.  Feel SNF is safest D/C plan at this time.  Will continue to follow.    Follow Up Recommendations  SNF     Does the patient have the potential to tolerate intense rehabilitation     Barriers to Discharge        Equipment Recommendations  None recommended by PT    Recommendations for Other Services    Frequency Min 3X/week   Progress towards PT Goals Progress towards PT goals: Progressing toward goals  Plan Discharge plan needs to be updated    Precautions / Restrictions Precautions Precautions: Fall Restrictions Weight Bearing Restrictions: No   Pertinent Vitals/Pain Denied pain.      Mobility  Bed Mobility Overal bed mobility: Needs Assistance Bed Mobility: Supine to Sit;Sit to Supine Supine to sit: Mod assist Sit to supine: Mod assist General bed mobility comments: cues for initiation and safe technique.  pt needs A to bring trunk up to sitting.   Transfers Overall transfer level: Needs assistance Equipment used: Rolling walker (2 wheeled) Transfers: Sit to/from Stand Sit to Stand: Min assist General transfer comment: cues for UE use and anterior wt shift with coming up to stand and controlling descent to sitting.   Ambulation/Gait Ambulation/Gait assistance: Mod assist Ambulation Distance (Feet): 40 Feet Assistive device: Rolling walker (2  wheeled) Gait Pattern/deviations: Step-through pattern;Decreased stride length;Trunk flexed;Narrow base of support General Gait Details: pt unsteady and requires A for balance and RW management.  pt with posterior lean when attempting to get closer to RW or pt keeps RW far out in front of him and leans out to it.  pt with weakness in Bil knees appearing as they may buckle and trembling in Bil LEs.   Modified Rankin (Stroke Patients Only) Pre-Morbid Rankin Score: Moderate disability Modified Rankin: Moderately severe disability    Exercises     PT Diagnosis:    PT Problem List:   PT Treatment Interventions:     PT Goals (current goals can now be found in the care plan section) Acute Rehab PT Goals Time For Goal Achievement: 09/02/13 Potential to Achieve Goals: Fair  Visit Information  Last PT Received On: 08/21/13 Assistance Needed: +2 (for safety with ambulation) History of Present Illness: pt presents with 3 day hx of confusion and weakness.  pt with hx of dementia.      Subjective Data      Cognition  Cognition Arousal/Alertness: Awake/alert Behavior During Therapy: Flat affect Overall Cognitive Status: Impaired/Different from baseline Area of Impairment: Orientation;Attention;Memory;Following commands;Safety/judgement;Awareness;Problem solving Orientation Level: Disoriented to;Time;Situation;Place Current Attention Level: Focused Memory: Decreased short-term memory Following Commands: Follows one step commands inconsistently Safety/Judgement: Decreased awareness of safety;Decreased awareness of deficits Awareness: Intellectual;Emergent;Anticipatory Problem Solving: Slow processing;Decreased initiation;Difficulty sequencing;Requires verbal cues;Requires tactile cues General Comments: [name] and wife  indicates this is worse than baseline.      Balance     End of Session PT - End of Session Equipment Utilized During Treatment: Gait belt Activity Tolerance:  Patient limited by fatigue Patient left: in bed (Transport arrived to take to Vascular) Nurse Communication: Mobility status   GP     Bryan Moore, [place] 08/21/2013, 2:57 PM

## 2013-08-21 NOTE — Progress Notes (Signed)
[place] PHYSICAL MEDICINE AND REHABILITATION  CONSULT SERVICE NOTE  Pt off floor for test. Will follow up.   Meredith Staggers, MD, [place]

## 2013-08-21 NOTE — Progress Notes (Signed)
Patient's tele is d/c'd at this time per MD's order.

## 2013-08-21 NOTE — Progress Notes (Signed)
Nutrition Brief Note  Patient screened by RD due to low Braden score of 12. Per discussion with patient's wife in room with patient, he has been eating very well and weight has been stable. He ate poorly on day of admission and yesterday due to medication that made him drowsy. His appetite and oral intake are good now. He consumed 100% of dinner yesterday and breakfast this morning. No wounds noted. Current intake is adequate to meet nutrition needs.   Wt Readings from Last 15 Encounters:  08/19/13 151 lb 10.8 oz (68.8 kg)  06/04/13 164 lb 1.6 oz (74.435 kg)  06/04/13 164 lb 1.6 oz (74.435 kg)  04/13/13 155 lb (70.308 kg)  04/13/13 155 lb (70.308 kg)  04/08/13 156 lb 2 oz (70.818 kg)  03/02/13 155 lb 6.4 oz (70.489 kg)  01/19/13 151 lb 12.8 oz (68.856 kg)  07/17/12 155 lb 12.8 oz (70.67 kg)  01/11/12 154 lb 6.4 oz (70.035 kg)  12/17/11 158 lb (71.668 kg)  12/10/11 162 lb (73.483 kg)  07/06/11 161 lb 1.9 oz (73.084 kg)  06/27/11 154 lb (69.854 kg)  03/28/11 156 lb 12.8 oz (71.124 kg)    Body mass index is 23.07 kg/(m^2). Patient meets criteria for normal weight based on current BMI.   Current diet order is Heart Healthy, patient is consuming approximately 100% of meals at this time. Labs and medications reviewed.   No nutrition interventions warranted at this time. If nutrition issues arise, please consult RD.   Molli Barrows, RD, LDN, [place] Pager [phone] After Hours Pager [phone]

## 2013-08-21 NOTE — Consult Note (Signed)
Physical Medicine and Rehabilitation Consult  Reason for Consult:  Right sided weakness, confusion, lethargy.  Referring Physician: Dr. Grandville Silos.   HPI: Bryan Moore is a 80 y.o. male with h/o AVM, bladder cancer (pending surgery for TUR recurrent tumor), gait disorder as well as underlying dementia who was admitted 08/18/13 with 2-3 days of worsening confusion. Family reported that he fell on christmas but had no deficits from his fall. His wife noted increased weakness on the right side since 08/15/13 as well as  increased urinary frequency. He was started on IV cipro for bacturia and CT head was obtained showing left lacunar infarct in the internal capsule of indeterminate age. Neurology consulted for input and MRI brain done revealing subacute nonhemorrhagic infarct involving the left globus pallidus and medial basal ganglia. 2D echo with EF 45-50% with diffuse hypokinesis, mild MVS, s/p TAVR without stenosis and grade 1 dysfunction.  No antithrombotics as patient with h/o GIB on ASA. Bladder surgery on hold. Patient with right sided weakness, increased sedation and confusion past MRI on 08/19/13. Therapies initiated and OT recommending CIR.     Review of Systems  Eyes:       Some loss of peripheral vision per wife.   Respiratory: Positive for cough.   Cardiovascular: Negative for chest pain and leg swelling.  Musculoskeletal: Positive for falls and joint pain.  Neurological: Negative for headaches.  Psychiatric/Behavioral: Positive for memory loss (decreased recall of details of past few years. ).    Past Medical History  Diagnosis Date  . Anemia     from AVM's. Requiring transfusion  . Hyperlipemia   . Aortic stenosis     s/p TAVI July 2012  . Dementia   . Glaucoma   . Mitral regurgitation   . Shingles May 2013    right arm residual pain- last outbreak 1 1/2 yrs ago.  Marland Kitchen Hypertension     Not taking Lisinopril due to BP drops low.  . Transfusion history     last 2 yrs ago.   Marland Kitchen GERD (gastroesophageal reflux disease)     RARE - AND NO MEDS  . Cancer     BLADDER CANCER  . Arthritis   . Balance problem     WEAK ON RIGHT SIDE ( SCOLISOSIS AND HX OF A BACK SURGERY) - BALANCE PROBLEM - "FALLS BACKWARD" - FREQUENT FALLS.  USES CANE WHEN AMBULATING  . Complication of anesthesia     VERY DROWSY FOR HOURS AFTER SURGERY - AND DISORIENTATION  . Heart murmur     PT'S CARDIOLOGIST IS DR. Acie Fredrickson -LAST OFFICE VISIT 03/02/13 IN [facility]   Past Surgical History  Procedure Laterality Date  . Appendectomy    . Cardiac catheterization  10/23/2010    ef 65%  . US echocardiography  08/31/10    EF 55-60%  . Cardiovascular stress test  01/01/2007    EF 64%, NO EVIDENCE OF ISCHEMIA  . Cardiac valve replacement      02/2011([facility])  . Transurethral resection of bladder tumor with gyrus (turbt-gyrus) N/A 04/13/2013    Procedure: TRANSURETHRAL RESECTION OF BLADDER TUMOR WITH GYRUS (TURBT-GYRUS);  Surgeon: [name], MD;  Location: [facility];  Service: Urology;  Laterality: N/A;  . Cystoscopy with stent placement Left 04/13/2013    Procedure:  LEFT URETERAL STENT PLACEMENT;  Surgeon: [name], MD;  Location: [facility];  Service: Urology;  Laterality: Left;  . Back surgery  2010 OR 2011  . Transurethral resection of bladder tumor N/A  06/04/2013    Procedure: RESTAGING TRANSURETHRAL RESECTION OF BLADDER TUMOR (TURBT), URETERAL CATHERIZATION ;  Surgeon: [name], MD;  Location: [facility];  Service: Urology;  Laterality: N/A;  CYSTO RESTAGING TURBT      History reviewed. No pertinent family history.  Social History:  Married.  Retired Hotel manager. Per reports that he quit smoking about 32 years ago. He does not have any smokeless tobacco history on file. Per reports that he drinks a glass of wine daily. Per reports that he does not use illicit drugs.   Allergies  Allergen Reactions  . Advil [Ibuprofen] Other (See Comments)    Causes stomach bleeding, need transfusions as result  . Aspirin  Other (See Comments)    Causes stomach bleeding, needs transfusion as result  . Penicillins Itching and Swelling   Medications Prior to Admission  Medication Sig Dispense Refill  . acetaminophen (TYLENOL) 325 MG tablet Take 650 mg by mouth every 6 (six) hours as needed for moderate pain.       . brimonidine (ALPHAGAN P) 0.1 % SOLN Place 1 drop into both eyes 2 (two) times daily.       . cholecalciferol (VITAMIN D) 1000 UNITS tablet Take 1,000 Units by mouth every morning.       . dorzolamide-timolol (COSOPT) 22.3-6.8 MG/ML ophthalmic solution Place 1 drop into both eyes every 12 (twelve) hours.       . iron polysaccharides (NIFEREX) 150 MG capsule Take 150 mg by mouth every morning.       . latanoprost (XALATAN) 0.005 % ophthalmic solution Place 1 drop into both eyes at bedtime.       . Multiple Vitamin (MULTIVITAMIN WITH MINERALS) TABS tablet Take 1 tablet by mouth every morning.       . vitamin B-12 (CYANOCOBALAMIN) 250 MCG tablet Take 250 mcg by mouth every morning.         Home: Home Living Family/patient expects to be discharged to:: Private residence Living Arrangements: Spouse/significant other Available Help at Discharge: Family;Available 24 hours/day Type of Home: House Home Access: Stairs to enter [employer] of Steps: 3 Entrance Stairs-Rails: Right Home Layout: Two level;Able to live on main level with bedroom/bathroom Home Equipment: Gilford Rile - 2 wheels;Cane - single point;Shower seat - built in Additional Comments: pt's wife indicates she is only CG for pt.    Lives With: Spouse  Functional History: Prior Function Vocation: Retired Functional Status:  Mobility:     Ambulation/Gait Engineer, civil (consulting) (Feet): 12 Feet (x2) General Gait Details: pt unsteady and requires A for balance and management of RW as pt tends to push it out in front of him and at times tries to reach for furniture or sink.  pt incontinent of urine upon standing and pt unaware until he  has already started urinating.  pt unable to stop flow of urine and ambulated into bathromm while urinating.      ADL: ADL ADL Comments: total A at this time due to cognition/lethargy  Cognition: Cognition Overall Cognitive Status: Impaired/Different from baseline Arousal/Alertness: Lethargic Orientation Level: Oriented to person Attention: Focused Focused Attention: Impaired Focused Attention Impairment: Verbal basic;Functional basic Memory:  (TBA) Awareness:  (TBA) Problem Solving:  (TBA) Safety/Judgment:  (TBA) Cognition Arousal/Alertness: Awake/alert Behavior During Therapy: Flat affect Overall Cognitive Status: Impaired/Different from baseline Area of Impairment: Orientation;Attention;Memory;Following commands;Safety/judgement;Awareness;Problem solving Orientation Level: Disoriented to;Time;Situation;Place Current Attention Level: Focused Memory: Decreased short-term memory Following Commands: Follows one step commands inconsistently Safety/Judgement: Decreased awareness of safety;Decreased awareness of deficits Awareness: Intellectual;Emergent;Anticipatory  Problem Solving: Slow processing;Decreased initiation;Difficulty sequencing;Requires verbal cues;Requires tactile cues General Comments: [name] and wife indicates this is worse than baseline.    Blood pressure 132/72, pulse 101, temperature 98 F (36.7 C), temperature source Oral, resp. rate 20, height 5\' 8"  (1.727 m), weight 68.8 kg (151 lb 10.8 oz), SpO2 97.00%. Physical Exam  Nursing note and vitals reviewed. Constitutional: He appears well-developed and well-nourished.  HENT:  Head: Normocephalic and atraumatic.  Eyes: Pupils are equal, round, and reactive to light.  Neck: Normal range of motion. Neck supple.  Cardiovascular: Normal rate and regular rhythm.   Murmur heard. Respiratory: Effort normal and breath sounds normal. No respiratory distress. He has no wheezes.  GI: Soft. Bowel sounds  are normal. He exhibits no distension. There is no tenderness.  Musculoskeletal: He exhibits no edema and no tenderness.  Neurological: He is alert.  Slurred speech. Alert  . Able to recall place and city after multiple cues.   Able to follow basic commands with redirection. Right pronator drift and decreased [place], mild limb ataxia RUE greater than RLE. Strength grossly 4/5 RUE, 4+ LUE, RLE 3+HF to 4/5 at ankle. LLE is 4 to 4+ throughout.   Skin: Skin is warm and dry.  Clearing of erythema bilateral thighs.   Psychiatric: Cognition and memory are impaired. He expresses impulsivity. He is inattentive.    Results for orders placed during the hospital encounter of 08/18/13 (from the past 24 hour(s))  BASIC METABOLIC PANEL     Status: Abnormal   Collection Time    08/21/13  5:00 AM      Result Value Range   Sodium 137  137 - 147 mEq/L   Potassium 4.1  3.7 - 5.3 mEq/L   Chloride 101  96 - 112 mEq/L   CO2 20  19 - 32 mEq/L   Glucose, Bld 89  70 - 99 mg/dL   BUN 17  6 - 23 mg/dL   Creatinine, Ser 1.09  0.50 - 1.35 mg/dL   Calcium 8.9  8.4 - 10.5 mg/dL   GFR calc non Af Amer 57 (*) >90 mL/min   GFR calc Af Amer 66 (*) >90 mL/min   Mr Brain Wo Contrast  08/19/2013   CLINICAL DATA:  Stroke.  2-3 days of worsening confusion.  EXAM: MRI HEAD WITHOUT CONTRAST  MRA HEAD WITHOUT CONTRAST  TECHNIQUE: Multiplanar, multiecho pulse sequences of the brain and surrounding structures were obtained without intravenous contrast. Angiographic images of the head were obtained using MRA technique without contrast.  COMPARISON:  CT head without contrast 08/18/2013.  FINDINGS: MRI HEAD FINDINGS  The diffusion-weighted images confirm an acute nonhemorrhagic infarct involving the left globus pallidus and medial basal ganglia. This infarct measures 18 mm maximally. Associated T2 signal changes are noted.  Moderate generalized atrophy is present. Periventricular and subcortical white matter changes are noted bilaterally.  Flow is present in the major intracranial arteries. The patient is status post bilateral lens extractions. The ventricles are proportionate to the degree of atrophy. No significant extra-axial fluid collection is present. Mild degenerative anterolisthesis is present at C3-4.  The patient is status post bilateral lens extractions. Mild mucosal thickening is evident throughout the anterior paranasal sinuses. The sphenoid sinuses are clear. The mastoid air cells are clear.  MRA HEAD FINDINGS  The MRI a is mildly degraded by patient motion. Atherosclerotic changes are noted in the cavernous carotid arteries bilaterally without significant stenosis. The A1 and M1 segments appear to be  within normal limits. The MCA bifurcations are intact. Mild small vessel disease is present bilaterally.  The right vertebral artery is slightly dominant to the left. The right PICA origin is visualized and normal. The left PICA is not visualized. The basilar artery is within normal limits. Both posterior cerebral arteries originate from the basilar tip. There is some attenuation of distal PCA branch vessels bilaterally, left greater than right.  IMPRESSION: 1. A subacute nonhemorrhagic infarct involving the medial left basal ganglia measures 18 mm maximally. T2 signal changes are compatible with the subacute time frame. 2. Moderate generalized atrophy and white matter disease. 3. Mild anterior sinus disease. 4. Atherosclerotic changes within the cavernous carotid arteries without stenosis. 5. Mild diffuse small vessel disease. This likely reflects the sequelae of chronic microvascular ischemia.   Electronically Signed   By: Lawrence Santiago M.D.   On: 08/19/2013 21:15   Mr Jodene Nam Head/brain Wo Cm  08/19/2013   CLINICAL DATA:  Stroke.  2-3 days of worsening confusion.  EXAM: MRI HEAD WITHOUT CONTRAST  MRA HEAD WITHOUT CONTRAST  TECHNIQUE: Multiplanar, multiecho pulse sequences of the brain and surrounding structures were obtained without  intravenous contrast. Angiographic images of the head were obtained using MRA technique without contrast.  COMPARISON:  CT head without contrast 08/18/2013.  FINDINGS: MRI HEAD FINDINGS  The diffusion-weighted images confirm an acute nonhemorrhagic infarct involving the left globus pallidus and medial basal ganglia. This infarct measures 18 mm maximally. Associated T2 signal changes are noted.  Moderate generalized atrophy is present. Periventricular and subcortical white matter changes are noted bilaterally. Flow is present in the major intracranial arteries. The patient is status post bilateral lens extractions. The ventricles are proportionate to the degree of atrophy. No significant extra-axial fluid collection is present. Mild degenerative anterolisthesis is present at C3-4.  The patient is status post bilateral lens extractions. Mild mucosal thickening is evident throughout the anterior paranasal sinuses. The sphenoid sinuses are clear. The mastoid air cells are clear.  MRA HEAD FINDINGS  The MRI a is mildly degraded by patient motion. Atherosclerotic changes are noted in the cavernous carotid arteries bilaterally without significant stenosis. The A1 and M1 segments appear to be within normal limits. The MCA bifurcations are intact. Mild small vessel disease is present bilaterally.  The right vertebral artery is slightly dominant to the left. The right PICA origin is visualized and normal. The left PICA is not visualized. The basilar artery is within normal limits. Both posterior cerebral arteries originate from the basilar tip. There is some attenuation of distal PCA branch vessels bilaterally, left greater than right.  IMPRESSION: 1. A subacute nonhemorrhagic infarct involving the medial left basal ganglia measures 18 mm maximally. T2 signal changes are compatible with the subacute time frame. 2. Moderate generalized atrophy and white matter disease. 3. Mild anterior sinus disease. 4. Atherosclerotic changes  within the cavernous carotid arteries without stenosis. 5. Mild diffuse small vessel disease. This likely reflects the sequelae of chronic microvascular ischemia.   Electronically Signed   By: Lawrence Santiago M.D.   On: 08/19/2013 21:15    Assessment/Plan: Diagnosis: left basal ganglia infarct 1. Does the need for close, 24 hr/day medical supervision in concert with the patient's rehab needs make it unreasonable for this patient to be served in a less intensive setting? Yes 2. Co-Morbidities requiring supervision/potential complications: dementia, anemia, htn 3. Due to bladder management, bowel management, safety, skin/wound care, disease management, medication administration, pain management and patient education, does the patient require 24  hr/day rehab nursing? Yes 4. Does the patient require coordinated care of a physician, rehab nurse, PT (1-2 hrs/day, 5 days/week), OT (1-2 hrs/day, 5 days/week) and SLP (1-2 hrs/day, 5 days/week) to address physical and functional deficits in the context of the above medical diagnosis(es)? Yes Addressing deficits in the following areas: balance, endurance, locomotion, strength, transferring, bowel/bladder control, bathing, dressing, feeding, grooming, toileting, cognition, speech and psychosocial support 5. Can the patient actively participate in an intensive therapy program of at least 3 hrs of therapy per day at least 5 days per week? Yes 6. The potential for patient to make measurable gains while on inpatient rehab is good 7. Anticipated functional outcomes upon discharge from inpatient rehab are supervision with PT, supervision to min assist with OT, n/a with SLP. 8. Estimated rehab length of stay to reach the above functional goals is: 7-10 days 9. Does the patient have adequate social supports to accommodate these discharge functional goals? Yes 10. Anticipated D/C setting: Home 11. Anticipated post D/C treatments: [facility] therapy 12. Overall Rehab/Functional  Prognosis: good  RECOMMENDATIONS: This patient's condition is appropriate for continued rehabilitative care in the following setting: CIR Patient has agreed to participate in recommended program. Yes Note that insurance prior authorization may be required for reimbursement for recommended care.  Comment: Spoke with wife and patient. I believe he possesses enough cognitive ability to demonstrate carryover and to participate in our therapy program. Rehab Admissions Coordinator to follow up.  Thanks,  Meredith Staggers, MD, Mellody Drown     08/21/2013

## 2013-08-21 NOTE — Progress Notes (Signed)
[facility] PROGRESS NOTE  Bryan Moore [id number] DOB: Mar 11, 1920 DOA: 08/18/2013 PCP: [name], MD  Assessment/Plan: #1 acute encephalopathy/subacute CVA likely secondary to acute versus subacute stroke noted on CT of the head and MRI of the head in the setting of underlying dementia. Urine cultures are negative. 2-D echo with no source of emboli. Carotid Dopplers pending.  Patient unable to tolerate aspirin secondary to prior history of GI bleed requiring multiple transfusions. PT/OT/ST. Risk factor modification including blood pressure control. Continue statin. Neurology is following and appreciate input and recommendations.  #2 Bacteruria Urine cultures are negative. Discontinued antibiotics.  #3 hypertension Blood pressure improved. Hydralazine was never started. Will decrease Norvasc to 5 mg daily for some permissive hypertension secondary to acute stroke. Follow.  #4 anemia No overt GI bleed. H&H stable.Follow H&H.  #5 hyperlipidemia LDL is 100.  Continue statin.  #6 dementia Stable. Outpatient follow up. Patient more alert after Ativan as been discontinued.   #7 history of recurrent bladder cancer Patient noted to have too numerous to count RBCs on UA likely from bladder tumors. Patient is scheduled for TUR bladder tumor on 08/27/2013. Will discuss with neurology as to whether patient is stable for this surgery on 08/27/2013 as per urology they would prefer not to delay the procedure any longer secondary to rapidly recurrent bladder cancer with a history of superficial invasion. Per neurology recommend scheduling bladder surgery at the end of rehabilitation stay in a few weeks.  #8 prophylaxis SCDs for DVT prophylaxis.  Code Status: full Family Communication: updated wife at bedside. Disposition Plan: pending workup CIR versus SNF   Consultants:  Neurology Dr. Doy Mince: 08/19/2013  Procedures:  CT head 08/18/2013  Chest x-ray  08/18/2013  2-D echo 08/19/2013  MRI head 08/19/2013  Antibiotics: . IV Cipro 08/18/2013--> 08/19/2013 IV Rocephin 08/19/2013--> 08/20/2013  HPI/Subjective: Patient alert today following commands.  Per wife patient was able to shave himself.  Objective: Filed Vitals:   08/21/13 1852  BP: 129/57  Pulse: 85  Temp: 98.4 F (36.9 C)  Resp: 18    Intake/Output Summary (Last 24 hours) at 08/21/13 2138 Last data filed at 08/21/13 1700  Gross per 24 hour  Intake      0 ml  Output    950 ml  Net   -950 ml   Filed Weights   08/19/13 0000  Weight: 68.8 kg (151 lb 10.8 oz)    Exam:   General:  NAD  Cardiovascular: RRR  Respiratory: CTAB  Abdomen: Soft/NT/ND/+BS  Musculoskeletal: no C/C/E.  Data Reviewed: Basic Metabolic Panel:  Recent Labs Lab 08/18/13 1819 08/18/13 1928 08/19/13 0915 08/20/13 0350 08/21/13 0500  NA 140 141 139 142 137  K 4.7 4.6 4.2 4.7 4.1  CL 103 104 103 104 101  CO2 25  --  23 26 20   GLUCOSE 88 89 85 95 89  BUN 28* 28* 24* 21 17  CREATININE 1.34 1.40* 1.16 1.20 1.09  CALCIUM 8.7  --  8.9 9.0 8.9   Liver Function Tests:  Recent Labs Lab 08/18/13 1819  AST 19  ALT 11  ALKPHOS 76  BILITOT 0.2*  PROT 7.4  ALBUMIN 3.5   No results found for this basename: LIPASE, AMYLASE,  in the last 168 hours  Recent Labs Lab 08/19/13 0510  AMMONIA 37   CBC:  Recent Labs Lab 08/18/13 1819 08/18/13 1928 08/19/13 0915 08/20/13 0350  WBC 4.8  --  4.6 5.9  NEUTROABS 3.8  --   --   --  HGB 10.9* 11.6* 11.5* 11.1*  HCT 32.5* 34.0* 33.2* 33.1*  MCV 96.7  --  93.8 96.2  PLT 172  --  165 151   Cardiac Enzymes: No results found for this basename: CKTOTAL, CKMB, CKMBINDEX, TROPONINI,  in the last 168 hours BNP (last 3 results) No results found for this basename: PROBNP,  in the last 8760 hours CBG: No results found for this basename: GLUCAP,  in the last 168 hours  Recent Results (from the past 240 hour(s))  URINE CULTURE      Status: None   Collection Time    08/18/13  7:37 PM      Result Value Range Status   Specimen Description URINE, CATHETERIZED   Final   Special Requests NONE   Final   Culture  Setup Time     Final   Value: 08/19/2013 01:01     Performed at [place]     Final   Value: NO GROWTH     Performed at [facility]   Culture     Final   Value: NO GROWTH     Performed at [facility]   Report Status 08/19/2013 FINAL   Final     Studies: No results found.  Scheduled Meds: . amLODipine  10 mg Oral Daily  . atorvastatin  10 mg Oral q1800  . brimonidine  1 drop Both Eyes BID  . dorzolamide-timolol  1 drop Both Eyes Q12H  . iron polysaccharides  150 mg Oral q morning - 10a  . latanoprost  1 drop Both Eyes QHS  . multivitamin with minerals  1 tablet Oral q morning - 10a  . vitamin B-12  250 mcg Oral q morning - 10a   Continuous Infusions: . sodium chloride 75 mL/hr at 08/19/13 [id number]    Principal Problem:   CVA (cerebral infarction) Active Problems:   Hypertension   Hyperlipidemia   Anemia   Bladder cancer   Encephalopathy    Time spent: [place] MD [facility] Pager [phone]. If 7PM-7AM, please contact night-coverage at [email or web address], password [facility] 08/21/2013, 9:38 PM  LOS: 3 days

## 2013-08-21 NOTE — Progress Notes (Signed)
Stroke Team Progress Note  HISTORY Bryan Moore is an 80 y.o. male with underlying dementia who was brought to the hospital due to 2-3 days of worsening confusion. Majority of history is obtained from chart as patient is unable to provide. Per note he he fell on christmas but had no deficits from his fall Wife noted increased weakness on the right side since Sunday. Wife has also noted increased urinary frequency. Pateint was brought to the ED 08/18/2013, due to increasing confusion. While hospitalized CT head was obtained showing left lacunar infarct in the internal capsule of indeterminate age. Neurology was consulted regarding infarct.  Of note--Allergie to ASA which caused GI bleed in the past  Patient was not a TPA candidate secondary to delay in arrival.   SUBJECTIVE Stable no changes. Urology want to now timing of bladder surgery  OBJECTIVE Most recent Vital Signs: Filed Vitals:   08/20/13 1758 08/20/13 2054 08/21/13 0219 08/21/13 0605  BP: 193/60 164/56 116/63 132/72  Pulse: 66 84 97 101  Temp: 97.7 F (36.5 C) 97.5 F (36.4 C) 98.2 F (36.8 C) 98 F (36.7 C)  TempSrc: Oral Oral Axillary Oral  Resp: 16 18 18 20   Height:      Weight:      SpO2: 99% 100% 98% 97%   CBG (last 3)  No results found for this basename: GLUCAP,  in the last 72 hours  IV Fluid Intake:   . sodium chloride 75 mL/hr at 08/19/13 0953    MEDICATIONS  . amLODipine  10 mg Oral Daily  . atorvastatin  10 mg Oral q1800   PRN:  diphenhydrAMINE, hydrALAZINE, senna-docusate  Diet:  Cardiac thin liquids Activity:    DVT Prophylaxis:  SCDs ordered, not on pt  CLINICALLY SIGNIFICANT STUDIES Basic Metabolic Panel:   Recent Labs Lab 08/20/13 0350 08/21/13 0500  NA 142 137  K 4.7 4.1  CL 104 101  CO2 26 20  GLUCOSE 95 89  BUN 21 17  CREATININE 1.20 1.09  CALCIUM 9.0 8.9   Liver Function Tests:   Recent Labs Lab 08/18/13 1819  AST 19  ALT 11  ALKPHOS 76  BILITOT 0.2*  PROT 7.4  ALBUMIN  3.5   CBC:  Recent Labs Lab 08/18/13 1819  08/19/13 0915 08/20/13 0350  WBC 4.8  --  4.6 5.9  NEUTROABS 3.8  --   --   --   HGB 10.9*  < > 11.5* 11.1*  HCT 32.5*  < > 33.2* 33.1*  MCV 96.7  --  93.8 96.2  PLT 172  --  165 151  < > = values in this interval not displayed. Coagulation: No results found for this basename: LABPROT, INR,  in the last 168 hours Cardiac Enzymes: No results found for this basename: CKTOTAL, CKMB, CKMBINDEX, TROPONINI,  in the last 168 hours Urinalysis:   Recent Labs Lab 08/18/13 1937  COLORURINE YELLOW  LABSPEC 1.018  PHURINE 6.0  GLUCOSEU NEGATIVE  HGBUR LARGE*  BILIRUBINUR NEGATIVE  KETONESUR 15*  PROTEINUR NEGATIVE  UROBILINOGEN 0.2  NITRITE NEGATIVE  LEUKOCYTESUR SMALL*   Lipid Panel    Component Value Date/Time   CHOL 162 08/20/2013 0350   TRIG 47 08/20/2013 0350   HDL 53 08/20/2013 0350   CHOLHDL 3.1 08/20/2013 0350   VLDL 9 08/20/2013 0350   LDLCALC 100* 08/20/2013 0350   HgbA1C  Lab Results  Component Value Date   HGBA1C 5.2 08/19/2013    Urine Drug Screen:   No  results found for this basename: labopia,  cocainscrnur,  labbenz,  amphetmu,  thcu,  labbarb    Alcohol Level: No results found for this basename: ETH,  in the last 168 hours   CT of the brain  08/18/2013    Suspected 10 mm lacunar infarct in the left internal capsule, age indeterminate, new from 2010.  Atrophy with small vessel ischemic changes.    MRI of the brain  08/19/2013   1. A subacute nonhemorrhagic infarct involving the medial left basal ganglia measures 18 mm maximally. T2 signal changes are compatible with the subacute time frame. 2. Moderate generalized atrophy and white matter disease. 3. Mild anterior sinus disease.   MRA of the brain  08/19/2013    4. Atherosclerotic changes within the cavernous carotid arteries without stenosis. 5. Mild diffuse small vessel disease. This likely reflects the sequelae of chronic microvascular ischemia.   2D Echocardiogram   EF 45-50% with no source of embolus. Diffuse hypokinesis.  Carotid Doppler    CXR  08/18/2013   Stable mild cardiomegaly.  No acute cardiopulmonary disease.     EKG  normal sinus rhythm.   Therapy Recommendations CIR  Physical Exam   Patient sedated from agitation last night.  Afebrile. Head is nontraumatic. Neck is supple without bruit.   Cardiac exam no murmur or gallop. Lungs are clear to auscultation. Distal pulses are well felt. Neurological Exam : Stuporous not following commands. Can be barely aroused response to noxious stimuli by moaning and groaning and speech is nonsensical. Moves all 4 extremities purposefully against gravity and localizes to pain. No focal weakness. Deep tendon reflexes are symmetric. Plantars are downgoing.  ASSESSMENT Mr. Bryan Moore is a 80 y.o. male presenting with 2-3 days of worsening confusion. Imaging confirms a left basal ganglia infarct. Infarct felt to be thrombotic secondary to small vessel disease.  On no antithrombotics prior to admission. Now on no antithrombotics for secondary stroke prevention given hx GIB on antiplatelets. Patient with resultant lethargy (post ativan for MRI yesterday), increased confusion, right hemiparesis. Work up underway.  hypertension Hyperlipidemia, LDL 100, on no statin PTA, now on no statin, goal LDL < 100 (< 70 for diabetics) Aortic stenosis Dementia MR  Hx bladder cancer - scheduled for TUR bladder tumor for recurrent bladder cancer 08/27/2013. Per Dr. [name], he has had negative urine cultures on 1/7 and 1/13 so UTI is not an issue. He has TNTC RBC's on UA from his bladder tumors. Please let him know if he will be medically fit for surgery on 1/22. He prefers not to delay the procedure any longer than necessary because he has rapidly recurring bladder cancer with a history of superficial invasion.  Hx GIB on aspirin and plavix, required hospitalization and ongoing blood transfusions post op. Greater than 1 year  since his last transfusion.  Hospital day # 3  TREATMENT/PLAN  No antithrombotics for this patient due to hx severe GIB and ongoing needs for transfusion when on aspirin due to what sounds like AVMs  Recommend schedule bladder surgery at the end of rehab stay in few weeks. -   Complete stroke workup - f/u CD  Therapy evals  I have personally obtained a history, examined the patient, evaluated imaging results, and formulated the assessment and plan of care. I agree with the above. Antony Contras, MD

## 2013-08-22 LAB — BASIC METABOLIC PANEL
BUN: 22 mg/dL (ref 6–23)
CHLORIDE: 105 meq/L (ref 96–112)
CO2: 23 mEq/L (ref 19–32)
Calcium: 8.7 mg/dL (ref 8.4–10.5)
Creatinine, Ser: 1.12 mg/dL (ref 0.50–1.35)
GFR calc Af Amer: 64 mL/min — ABNORMAL LOW (ref >90)
GFR, EST NON AFRICAN AMERICAN: 55 mL/min — AB (ref >90)
Glucose, Bld: 84 mg/dL (ref 70–99)
Potassium: 4.1 mEq/L (ref 3.7–5.3)
SODIUM: 141 meq/L (ref 137–147)

## 2013-08-22 NOTE — ED Provider Notes (Signed)
I saw and evaluated the patient, reviewed the resident's note and I agree with the findings and plan.  EKG Interpretation    Date/Time:  Tuesday August 18 2013 17:35:39 EST Ventricular Rate:  60 PR Interval:  194 QRS Duration: 140 QT Interval:  520 QTC Calculation: 520 R Axis:   58 Text Interpretation:  Sinus rhythm Ventricular premature complex Left bundle branch block ED PHYSICIAN INTERPRETATION AVAILABLE IN [facility] Confirmed by TEST, RECORD ([id number]) on 08/20/2013 11:58:19 AM              Tanna Furry, MD 08/22/13 [phone]

## 2013-08-22 NOTE — Progress Notes (Signed)
[facility] PROGRESS NOTE  Bryan Moore [id number] DOB: October 04, 1919 DOA: 08/18/2013 PCP: [name], MD  Assessment/Plan: #1 acute encephalopathy/subacute CVA likely secondary to acute versus subacute stroke noted on CT of the head and MRI of the head in the setting of underlying dementia. Urine cultures are negative. 2-D echo with no source of emboli. Carotid Dopplers with no significant ICA stenosis.  Patient unable to tolerate aspirin secondary to prior history of GI bleed requiring multiple transfusions. PT/OT/ST. Risk factor modification including blood pressure control. Continue statin. Neurology is following and appreciate input and recommendations.  #2 Bacteruria Urine cultures are negative. Discontinued antibiotics.  #3 hypertension Blood pressure improved. Hydralazine was never started. Continue Norvasc to 5 mg daily. Follow.  #4 anemia No overt GI bleed. H&H stable.Follow H&H.  #5 hyperlipidemia LDL is 100.  Continue statin.  #6 dementia Stable. Outpatient follow up. Patient more alert after Ativan as been discontinued.   #7 history of recurrent bladder cancer Patient noted to have too numerous to count RBCs on UA likely from bladder tumors. Patient is scheduled for TUR bladder tumor on 08/27/2013. Will discuss with neurology as to whether patient is stable for this surgery on 08/27/2013 as per urology they would prefer not to delay the procedure any longer secondary to rapidly recurrent bladder cancer with a history of superficial invasion. Per neurology recommend scheduling bladder surgery at the end of rehabilitation stay in a few weeks.  #8 prophylaxis SCDs for DVT prophylaxis.  Code Status: full Family Communication: updated wife at bedside. Disposition Plan: CIR versus SNF   Consultants:  Neurology Dr. Doy Mince: 08/19/2013  Procedures:  CT head 08/18/2013  Chest x-ray 08/18/2013  2-D echo 08/19/2013  MRI head 08/19/2013  Carotid  Dopplers 08/21/2013  Antibiotics: . IV Cipro 08/18/2013--> 08/19/2013 IV Rocephin 08/19/2013--> 08/20/2013  HPI/Subjective: Patient alert today following commands.  No complaints.  Objective: Filed Vitals:   08/22/13 1428  BP: 131/54  Pulse: 79  Temp: 98.1 F (36.7 C)  Resp: 20    Intake/Output Summary (Last 24 hours) at 08/22/13 1529 Last data filed at 08/22/13 1300  Gross per 24 hour  Intake 5835.91 ml  Output   1350 ml  Net 4485.91 ml   Filed Weights   08/19/13 0000  Weight: 68.8 kg (151 lb 10.8 oz)    Exam:   General:  NAD  Cardiovascular: RRR  Respiratory: CTAB  Abdomen: Soft/NT/ND/+BS  Musculoskeletal: no C/C/E.  Data Reviewed: Basic Metabolic Panel:  Recent Labs Lab 08/18/13 1819 08/18/13 1928 08/19/13 0915 08/20/13 0350 08/21/13 0500 08/22/13 0449  NA 140 141 139 142 137 141  K 4.7 4.6 4.2 4.7 4.1 4.1  CL 103 104 103 104 101 105  CO2 25  --  23 26 20 23   GLUCOSE 88 89 85 95 89 84  BUN 28* 28* 24* 21 17 22   CREATININE 1.34 1.40* 1.16 1.20 1.09 1.12  CALCIUM 8.7  --  8.9 9.0 8.9 8.7   Liver Function Tests:  Recent Labs Lab 08/18/13 1819  AST 19  ALT 11  ALKPHOS 76  BILITOT 0.2*  PROT 7.4  ALBUMIN 3.5   No results found for this basename: LIPASE, AMYLASE,  in the last 168 hours  Recent Labs Lab 08/19/13 0510  AMMONIA 37   CBC:  Recent Labs Lab 08/18/13 1819 08/18/13 1928 08/19/13 0915 08/20/13 0350  WBC 4.8  --  4.6 5.9  NEUTROABS 3.8  --   --   --   HGB 10.9*  11.6* 11.5* 11.1*  HCT 32.5* 34.0* 33.2* 33.1*  MCV 96.7  --  93.8 96.2  PLT 172  --  165 151   Cardiac Enzymes: No results found for this basename: CKTOTAL, CKMB, CKMBINDEX, TROPONINI,  in the last 168 hours BNP (last 3 results) No results found for this basename: PROBNP,  in the last 8760 hours CBG: No results found for this basename: GLUCAP,  in the last 168 hours  Recent Results (from the past 240 hour(s))  URINE CULTURE     Status: None    Collection Time    08/18/13  7:37 PM      Result Value Range Status   Specimen Description URINE, CATHETERIZED   Final   Special Requests NONE   Final   Culture  Setup Time     Final   Value: 08/19/2013 01:01     Performed at [facility]     Final   Value: NO GROWTH     Performed at [facility]   Culture     Final   Value: NO GROWTH     Performed at [facility]   Report Status 08/19/2013 FINAL   Final     Studies: No results found.  Scheduled Meds: . amLODipine  5 mg Oral Daily  . atorvastatin  10 mg Oral q1800  . brimonidine  1 drop Both Eyes BID  . dorzolamide-timolol  1 drop Both Eyes Q12H  . iron polysaccharides  150 mg Oral q morning - 10a  . latanoprost  1 drop Both Eyes QHS  . multivitamin with minerals  1 tablet Oral q morning - 10a  . vitamin B-12  250 mcg Oral q morning - 10a   Continuous Infusions: . sodium chloride 50 mL/hr at 08/22/13 0118    Principal Problem:   CVA (cerebral infarction) Active Problems:   Hypertension   Hyperlipidemia   Anemia   Bladder cancer   Encephalopathy    Time spent: Cabell MD [facility] Pager [phone]. If 7PM-7AM, please contact night-coverage at [email or web address], password [facility] 08/22/2013, 3:29 PM  LOS: 4 days

## 2013-08-22 NOTE — Progress Notes (Signed)
Occupational Therapy Re-Evaluation Patient Details Name: Bryan Moore MRN: [id number] DOB: 06-27-1920 Today's Date: 08/22/2013 Time: [id number] OT Time Calculation (min): 52 min  OT Assessment / Plan / Recommendation History of present illness pt presents with 3 day hx of confusion and weakness.  pt with hx of dementia. Pt with R BG acute nonhemorrahgic CVA    Clinical Impression   PTA, pt was independent with ADL and mobility. Pt demonstrates a significant functional decline due to deficits below. Increased time spent with family discussing POC. After discussion with family, feel strongly that CIR is best D/C plan for pt. Given pt's baseline mild dementia, feel home is best option for pt and family. Wife is very supportive and can hire 24/7 caregivers to assist with care after D/C from [place]. Feel pt can make progress during CIR to decrease burden of care on caregivers in order to enable pt to D/C home with wife. Feel familiar home environment would be best for pt and concurrent rehab if needed given his level of cognitive deficits.     OT Assessment       Follow Up Recommendations  CIR    Barriers to Discharge  none    Equipment Recommendations  3 in 1 bedside comode    Recommendations for Other Services Rehab consult  Frequency  Min 3X/week    Precautions / Restrictions Precautions Precautions: Fall   Pertinent Vitals/Pain no apparent distress     ADL  Grooming: Supervision/safety;Set up Where Assessed - Grooming: Supported standing Upper Body Bathing: Set up;Supervision/safety Where Assessed - Upper Body Bathing: Unsupported sitting Lower Body Bathing: Moderate assistance Where Assessed - Lower Body Bathing: Supported sit to stand Upper Body Dressing: Moderate assistance Where Assessed - Upper Body Dressing: Unsupported sitting Lower Body Dressing: Moderate assistance Where Assessed - Lower Body Dressing: Supported sit to stand Toilet Transfer: Minimal  assistance Toilet Transfer Method: Other (comment) (ambulting) Toilet Transfer Equipment: Comfort height toilet Toileting - Clothing Manipulation and Hygiene: Minimal assistance Where Assessed - Toileting Clothing Manipulation and Hygiene: Sit to stand from 3-in-1 or toilet Equipment Used: Gait belt;Rolling walker Transfers/Ambulation Related to ADLs: min A. Mod A occ for balance check ADL Comments: min - mod A    OT Diagnosis:    OT Problem List:   OT Treatment Interventions:     OT Goals(Current goals can be found in the care plan section) Acute Rehab OT Goals Patient Stated Goal: Per wife to return to home.   OT Goal Formulation: With family Time For Goal Achievement: 09/03/13 Potential to Achieve Goals: Good  Visit Information  Last OT Received On: 08/22/13 Assistance Needed: +1 History of Present Illness: pt presents with 3 day hx of confusion and weakness.  pt with hx of dementia. Pt with R BG CVA        Prior Functioning               Vision/Perception  will further assess   Cognition  Cognition Arousal/Alertness: Awake/alert Behavior During Therapy: Flat affect Overall Cognitive Status: Impaired/Different from baseline Area of Impairment: Orientation;Attention;Memory;Following commands;Safety/judgement;Awareness;Problem solving Orientation Level: Disoriented to;Time;Situation Current Attention Level: Sustained Memory: Decreased recall of precautions;Decreased short-term memory Following Commands: Follows one step commands with increased time Safety/Judgement: Decreased awareness of safety;Decreased awareness of deficits Awareness: Intellectual Problem Solving: Slow processing;Decreased initiation;Difficulty sequencing;Requires verbal cues;Requires tactile cues General Comments: cognitive status much worse than baseline    Extremity/Trunk Assessment       Mobility Bed Mobility Overal bed mobility: Needs Assistance  Bed Mobility: Supine to Sit Supine  to sit: Supervision;HOB elevated Transfers Overall transfer level: Needs assistance Transfers: Sit to/from Stand Sit to Stand: Min assist     Exercise Other Exercises Other Exercises: encouraged family to complete BLE and BUE AROmwith pt   Balance Balance Overall balance assessment: Needs assistance Sitting-balance support: Feet supported;Bilateral upper extremity supported Sitting balance-Leahy Scale: Fair Postural control: Posterior lean (at times)   End of Session OT - End of Session Equipment Utilized During Treatment: Gait belt;Rolling walker Activity Tolerance: Patient tolerated treatment well Patient left: in chair;with call bell/phone within reach;with chair alarm set;with family/visitor present Nurse Communication: Mobility status  GO     Alizaya Oshea,HILLARY 08/22/2013, 3:34 PM [facility], OTR/L  [phone] 08/22/2013

## 2013-08-22 NOTE — Progress Notes (Addendum)
Stroke Team Progress Note  HISTORY Bryan Moore is a 80 y.o. male with underlying dementia who was brought to the hospital 08/18/2013 secondary to a 2-3 days of worsening confusion. Majority of history was obtained from chart as patient was unable to provide. Per note he he fell on christmas but had no deficits from his fall Wife noted increased weakness on the right side since Sunday. Wife had also noted increased urinary frequency. Pateint was brought to the ED 08/18/2013, due to increasing confusion. While hospitalized CT head was obtained showing left lacunar infarct in the internal capsule of indeterminate age. Neurology was consulted regarding infarct.  Of note--Allergie to ASA which caused GI bleed in the past  Patient was not a TPA candidate secondary to delay in arrival.   SUBJECTIVE The patient's wife is at the bedside. She had multiple questions. She would like to know if low-dose aspirin would be a possibility for secondary stroke prevention. Apparently the patient is followed closely by Dr. Cristina Gong for GI bleeding. I informed the patient's wife that we would probably need to consult Dr. Cristina Gong prior to considering any antiplatelet therapy.  OBJECTIVE Most recent Vital Signs: Filed Vitals:   08/21/13 2105 08/22/13 0105 08/22/13 0550 08/22/13 1011  BP: 148/55 118/57 130/60 136/86  Pulse: 85 79 77 73  Temp: 98 F (36.7 C) 97.9 F (36.6 C) 97.4 F (36.3 C) 97.7 F (36.5 C)  TempSrc: Oral Oral Oral Oral  Resp: 20 18 20 20   Height:      Weight:      SpO2: 96% 98% 99% 98%   CBG (last 3)  No results found for this basename: GLUCAP,  in the last 72 hours  IV Fluid Intake:   . sodium chloride 50 mL/hr at 08/22/13 0118    MEDICATIONS  . amLODipine  5 mg Oral Daily  . atorvastatin  10 mg Oral q1800  . brimonidine  1 drop Both Eyes BID  . dorzolamide-timolol  1 drop Both Eyes Q12H  . iron polysaccharides  150 mg Oral q morning - 10a  . latanoprost  1 drop Both Eyes QHS  .  multivitamin with minerals  1 tablet Oral q morning - 10a  . vitamin B-12  250 mcg Oral q morning - 10a   PRN:  diphenhydrAMINE, hydrALAZINE, senna-docusate  Diet:  Cardiac thin liquids Activity:   Up with assistance DVT Prophylaxis:  SCDs ordered  CLINICALLY SIGNIFICANT STUDIES Basic Metabolic Panel:   Recent Labs Lab 08/21/13 0500 08/22/13 0449  NA 137 141  K 4.1 4.1  CL 101 105  CO2 20 23  GLUCOSE 89 84  BUN 17 22  CREATININE 1.09 1.12  CALCIUM 8.9 8.7   Liver Function Tests:   Recent Labs Lab 08/18/13 1819  AST 19  ALT 11  ALKPHOS 76  BILITOT 0.2*  PROT 7.4  ALBUMIN 3.5   CBC:  Recent Labs Lab 08/18/13 1819  08/19/13 0915 08/20/13 0350  WBC 4.8  --  4.6 5.9  NEUTROABS 3.8  --   --   --   HGB 10.9*  < > 11.5* 11.1*  HCT 32.5*  < > 33.2* 33.1*  MCV 96.7  --  93.8 96.2  PLT 172  --  165 151  < > = values in this interval not displayed. Coagulation: No results found for this basename: LABPROT, INR,  in the last 168 hours Cardiac Enzymes: No results found for this basename: CKTOTAL, CKMB, CKMBINDEX, TROPONINI,  in the last  168 hours Urinalysis:   Recent Labs Lab 08/18/13 1937  COLORURINE YELLOW  LABSPEC 1.018  PHURINE 6.0  GLUCOSEU NEGATIVE  HGBUR LARGE*  BILIRUBINUR NEGATIVE  KETONESUR 15*  PROTEINUR NEGATIVE  UROBILINOGEN 0.2  NITRITE NEGATIVE  LEUKOCYTESUR SMALL*   Lipid Panel    Component Value Date/Time   CHOL 162 08/20/2013 0350   TRIG 47 08/20/2013 0350   HDL 53 08/20/2013 0350   CHOLHDL 3.1 08/20/2013 0350   VLDL 9 08/20/2013 0350   LDLCALC 100* 08/20/2013 0350   HgbA1C  Lab Results  Component Value Date   HGBA1C 5.2 08/19/2013    Urine Drug Screen:   No results found for this basename: labopia,  cocainscrnur,  labbenz,  amphetmu,  thcu,  labbarb    Alcohol Level: No results found for this basename: ETH,  in the last 168 hours   CT of the brain  08/18/2013    Suspected 10 mm lacunar infarct in the left internal capsule, age  indeterminate, new from 2010.  Atrophy with small vessel ischemic changes.    MRI of the brain   08/19/2013    1. A subacute nonhemorrhagic infarct involving the medial left basal ganglia measures 18 mm maximally. T2 signal changes are compatible with the subacute time frame. 2. Moderate generalized atrophy and white matter disease. 3. Mild anterior sinus disease.   MRA of the brain   08/19/2013     Atherosclerotic changes within the cavernous carotid arteries without stenosis. Mild diffuse small vessel disease. This likely reflects the sequelae of chronic microvascular ischemia.   2D Echocardiogram  EF 45-50% with no source of embolus. Diffuse hypokinesis.  Carotid Doppler  - Preliminary report: Bilateral: 1-39% ICA stenosis. Vertebral artery flow is antegrade.   CXR  08/18/2013   Stable mild cardiomegaly.  No acute cardiopulmonary disease.     EKG  normal sinus rhythm.   Therapy Recommendations OT - CIR      PT - SNF  Physical Exam   Mental Status: Alert - he remains confused.  Mild dysarthria.  Able to follow 3 step commands. Cranial Nerves: II: Discs not visualized; Visual fields grossly normal, pupils equal, round, reactive to light and accommodation III,IV, VI: ptosis not present, extra-ocular motions intact bilaterally V,VII: smile asymmetric, facial light touch sensation normal bilaterally VIII: hearing normal bilaterally IX,X: gag reflex present XI: bilateral shoulder shrug XII: midline tongue extension Motor: Right : Upper extremity   5/5    Left:     Upper extremity   5/5  Lower extremity   5/5     Lower extremity   5/5 Tone and bulk:normal tone throughout; no atrophy noted Sensory: Pinprick and light touch intact throughout, bilaterally Deep Tendon Reflexes: 2+ and symmetric throughout Plantars: Right: downgoing   Left: downgoing Cerebellar: normal finger-to-nose, normal rapid alternating movements and normal heel-to-shin test Gait: deferred at this time CV: pulses  palpable throughout   ASSESSMENT Mr. Bryan Moore is a 80 y.o. male presenting with 2-3 days of worsening confusion. Imaging confirms a left basal ganglia infarct. Infarct felt to be thrombotic secondary to small vessel disease.  On no antithrombotics prior to admission. Now on no antithrombotics for secondary stroke prevention given hx GIB on antiplatelets. Patient with resultant lethargy (post ativan for MRI yesterday), increased confusion, right hemiparesis. Work up underway.  hypertension Hyperlipidemia, cholesterol 162, LDL 100, on no statin PTA, now on Lipitor - LDL goal less than 70 Aortic stenosis Dementia MR  Hx bladder cancer -  Was scheduled for TUR bladder tumor for recurrent bladder cancer 1 Per Dr. [name], delayed due to to stroke, he has had negative urine cultures on 1/7 and 1/13 so UTI is not an issue. He has TNTC RBC's on UA from his bladder tumors. Please let him know if he will be medically fit for surgery on 1/22. He prefers not to delay the procedure any longer than necessary because he has rapidly recurring bladder cancer with a history of superficial invasion.  Hx GIB on aspirin and Plavix in past,  required hospitalization and ongoing blood transfusions post op. Greater than 1 year since his last transfusion.  Hospital day # 4  TREATMENT/PLAN  No antithrombotics for this patient due to hx severe GIB and ongoing needs for transfusion when on aspirin due to what sounds like AVMs Last bleed 18 months ago, ongoing low grade heme + stools  Recommend schedule bladder surgery at the end of rehab stay in few weeks. -   Stroke work up complete. Will sign off, Dr. [name] to see in 2 months  Therapy recommendations -  Inpatient rehabilitation   Consult Dr. Cristina Gong to discuss aspirin therapy; although, aspirin would no doubt need to be held for pending bladder surgery.    Mikey Bussing PA-C [facility] Pager [phone] 08/22/2013, 10:52 AM   I have  personally obtained a history, examined the patient, evaluated imaging results, and formulated the assessment and plan of care. I agree with the above.  Lenor Coffin

## 2013-08-23 LAB — BASIC METABOLIC PANEL
BUN: 24 mg/dL — ABNORMAL HIGH (ref 6–23)
CO2: 23 mEq/L (ref 19–32)
CREATININE: 1.15 mg/dL (ref 0.50–1.35)
Calcium: 8.6 mg/dL (ref 8.4–10.5)
Chloride: 105 mEq/L (ref 96–112)
GFR calc non Af Amer: 54 mL/min — ABNORMAL LOW (ref >90)
GFR, EST AFRICAN AMERICAN: 62 mL/min — AB (ref >90)
Glucose, Bld: 157 mg/dL — ABNORMAL HIGH (ref 70–99)
Potassium: 3.9 mEq/L (ref 3.7–5.3)
SODIUM: 141 meq/L (ref 137–147)

## 2013-08-23 NOTE — Progress Notes (Signed)
[facility] PROGRESS NOTE  Bryan Moore [id number] DOB: 03-21-1920 DOA: 08/18/2013 PCP: [name], MD  Assessment/Plan: #1 acute encephalopathy/subacute CVA likely secondary to acute versus subacute stroke noted on CT of the head and MRI of the head in the setting of underlying dementia. Urine cultures are negative. 2-D echo with no source of emboli. Carotid Dopplers with no significant ICA stenosis.  Patient unable to tolerate aspirin secondary to prior history of GI bleed requiring multiple transfusions. PT/OT/ST. Risk factor modification including blood pressure control. Continue statin. Neurology is following and appreciate input and recommendations.  #2 Bacteruria Urine cultures are negative. Discontinued antibiotics.  #3 hypertension Blood pressure improved. Hydralazine was never started. Continue Norvasc to 5 mg daily. Follow.  #4 anemia No overt GI bleed. H&H stable.Follow H&H.  #5 hyperlipidemia LDL is 100.  Continue statin.  #6 dementia Stable. Outpatient follow up. Patient more alert after Ativan as been discontinued.   #7 history of recurrent bladder cancer Patient noted to have too numerous to count RBCs on UA likely from bladder tumors. Patient is scheduled for TUR bladder tumor on 08/27/2013. Will discuss with neurology as to whether patient is stable for this surgery on 08/27/2013 as per urology they would prefer not to delay the procedure any longer secondary to rapidly recurrent bladder cancer with a history of superficial invasion. Per neurology recommend scheduling bladder surgery at the end of rehabilitation stay in a few weeks.  #8 prophylaxis SCDs for DVT prophylaxis.  Code Status: full Family Communication: updated wife at bedside. Disposition Plan: CIR versus SNF   Consultants:  Neurology Dr. Doy Mince: 08/19/2013  Procedures:  CT head 08/18/2013  Chest x-ray 08/18/2013  2-D echo 08/19/2013  MRI head 08/19/2013  Carotid  Dopplers 08/21/2013  Antibiotics: . IV Cipro 08/18/2013--> 08/19/2013 IV Rocephin 08/19/2013--> 08/20/2013  HPI/Subjective: Patient alert today following commands.  No complaints.  Objective: Filed Vitals:   08/23/13 0950  BP: 140/51  Pulse: 84  Temp: 98.5 F (36.9 C)  Resp: 20    Intake/Output Summary (Last 24 hours) at 08/23/13 1125 Last data filed at 08/23/13 0229  Gross per 24 hour  Intake    120 ml  Output    975 ml  Net   -855 ml   Filed Weights   08/19/13 0000  Weight: 68.8 kg (151 lb 10.8 oz)    Exam:   General:  NAD  Cardiovascular: RRR  Respiratory: CTAB  Abdomen: Soft/NT/ND/+BS  Musculoskeletal: no C/C/E.  Data Reviewed: Basic Metabolic Panel:  Recent Labs Lab 08/19/13 0915 08/20/13 0350 08/21/13 0500 08/22/13 0449 08/23/13 0830  NA 139 142 137 141 141  K 4.2 4.7 4.1 4.1 3.9  CL 103 104 101 105 105  CO2 23 26 20 23 23   GLUCOSE 85 95 89 84 157*  BUN 24* 21 17 22  24*  CREATININE 1.16 1.20 1.09 1.12 1.15  CALCIUM 8.9 9.0 8.9 8.7 8.6   Liver Function Tests:  Recent Labs Lab 08/18/13 1819  AST 19  ALT 11  ALKPHOS 76  BILITOT 0.2*  PROT 7.4  ALBUMIN 3.5   No results found for this basename: LIPASE, AMYLASE,  in the last 168 hours  Recent Labs Lab 08/19/13 0510  AMMONIA 37   CBC:  Recent Labs Lab 08/18/13 1819 08/18/13 1928 08/19/13 0915 08/20/13 0350  WBC 4.8  --  4.6 5.9  NEUTROABS 3.8  --   --   --   HGB 10.9* 11.6* 11.5* 11.1*  HCT 32.5* 34.0* 33.2*  33.1*  MCV 96.7  --  93.8 96.2  PLT 172  --  165 151   Cardiac Enzymes: No results found for this basename: CKTOTAL, CKMB, CKMBINDEX, TROPONINI,  in the last 168 hours BNP (last 3 results) No results found for this basename: PROBNP,  in the last 8760 hours CBG: No results found for this basename: GLUCAP,  in the last 168 hours  Recent Results (from the past 240 hour(s))  URINE CULTURE     Status: None   Collection Time    08/18/13  7:37 PM      Result Value  Range Status   Specimen Description URINE, CATHETERIZED   Final   Special Requests NONE   Final   Culture  Setup Time     Final   Value: 08/19/2013 01:01     Performed at [place]     Final   Value: NO GROWTH     Performed at [facility]   Culture     Final   Value: NO GROWTH     Performed at [facility]   Report Status 08/19/2013 FINAL   Final     Studies: No results found.  Scheduled Meds: . amLODipine  5 mg Oral Daily  . atorvastatin  10 mg Oral q1800  . brimonidine  1 drop Both Eyes BID  . dorzolamide-timolol  1 drop Both Eyes Q12H  . iron polysaccharides  150 mg Oral q morning - 10a  . latanoprost  1 drop Both Eyes QHS  . multivitamin with minerals  1 tablet Oral q morning - 10a  . vitamin B-12  250 mcg Oral q morning - 10a   Continuous Infusions:    Principal Problem:   CVA (cerebral infarction) Active Problems:   Hypertension   Hyperlipidemia   Anemia   Bladder cancer   Encephalopathy    Time spent: Jefferson MD [facility] Pager [phone]. If 7PM-7AM, please contact night-coverage at [email or web address], password [email or web address] 08/23/2013, 11:25 AM  LOS: 5 days

## 2013-08-23 NOTE — Progress Notes (Signed)
Patient OOB to chair with 2 assist. Patient comfortable in chair with call bell in reach and family at chair side

## 2013-08-23 NOTE — Progress Notes (Signed)
Walked patient down hallway, 1 assist with walker and gait belt, patient tolerated well, then back to chair with call bell in reach and family at chair side

## 2013-08-24 ENCOUNTER — Inpatient Hospital Stay (HOSPITAL_COMMUNITY)
Admission: RE | Admit: 2013-08-24 | Discharge: 2013-08-28 | DRG: 945 | Disposition: A | Discharge status: Home Health | Payer: Medicare Other | Source: Intra-hospital | Attending: Physical Medicine & Rehabilitation | Admitting: Physical Medicine & Rehabilitation

## 2013-08-24 DIAGNOSIS — R4789 Other speech disturbances: Secondary | ICD-10-CM | POA: Diagnosis not present

## 2013-08-24 DIAGNOSIS — K219 Gastro-esophageal reflux disease without esophagitis: Secondary | ICD-10-CM

## 2013-08-24 DIAGNOSIS — F0390 Unspecified dementia without behavioral disturbance: Secondary | ICD-10-CM | POA: Diagnosis not present

## 2013-08-24 DIAGNOSIS — Z952 Presence of prosthetic heart valve: Secondary | ICD-10-CM | POA: Diagnosis not present

## 2013-08-24 DIAGNOSIS — E860 Dehydration: Secondary | ICD-10-CM

## 2013-08-24 DIAGNOSIS — I10 Essential (primary) hypertension: Secondary | ICD-10-CM

## 2013-08-24 DIAGNOSIS — Z5189 Encounter for other specified aftercare: Secondary | ICD-10-CM | POA: Diagnosis not present

## 2013-08-24 DIAGNOSIS — I6330 Cerebral infarction due to thrombosis of unspecified cerebral artery: Secondary | ICD-10-CM | POA: Diagnosis not present

## 2013-08-24 DIAGNOSIS — H409 Unspecified glaucoma: Secondary | ICD-10-CM | POA: Diagnosis not present

## 2013-08-24 DIAGNOSIS — Z87891 Personal history of nicotine dependence: Secondary | ICD-10-CM | POA: Diagnosis not present

## 2013-08-24 DIAGNOSIS — Z880 Allergy status to penicillin: Secondary | ICD-10-CM

## 2013-08-24 DIAGNOSIS — IMO0002 Reserved for concepts with insufficient information to code with codable children: Secondary | ICD-10-CM

## 2013-08-24 DIAGNOSIS — E785 Hyperlipidemia, unspecified: Secondary | ICD-10-CM

## 2013-08-24 DIAGNOSIS — Z9089 Acquired absence of other organs: Secondary | ICD-10-CM

## 2013-08-24 DIAGNOSIS — I1310 Hypertensive heart and chronic kidney disease without heart failure, with stage 1 through stage 4 chronic kidney disease, or unspecified chronic kidney disease: Secondary | ICD-10-CM | POA: Diagnosis present

## 2013-08-24 DIAGNOSIS — Z8551 Personal history of malignant neoplasm of bladder: Secondary | ICD-10-CM

## 2013-08-24 DIAGNOSIS — I059 Rheumatic mitral valve disease, unspecified: Secondary | ICD-10-CM | POA: Diagnosis not present

## 2013-08-24 DIAGNOSIS — Z79899 Other long term (current) drug therapy: Secondary | ICD-10-CM

## 2013-08-24 DIAGNOSIS — I6350 Cerebral infarction due to unspecified occlusion or stenosis of unspecified cerebral artery: Secondary | ICD-10-CM

## 2013-08-24 DIAGNOSIS — R29898 Other symptoms and signs involving the musculoskeletal system: Secondary | ICD-10-CM

## 2013-08-24 DIAGNOSIS — R279 Unspecified lack of coordination: Secondary | ICD-10-CM

## 2013-08-24 DIAGNOSIS — Z886 Allergy status to analgesic agent status: Secondary | ICD-10-CM | POA: Diagnosis not present

## 2013-08-24 DIAGNOSIS — Z8673 Personal history of transient ischemic attack (TIA), and cerebral infarction without residual deficits: Secondary | ICD-10-CM | POA: Diagnosis present

## 2013-08-24 DIAGNOSIS — D649 Anemia, unspecified: Secondary | ICD-10-CM | POA: Diagnosis not present

## 2013-08-24 MED ORDER — ACETAMINOPHEN 325 MG PO TABS: 325.0000 mg | ORAL_TABLET | ORAL | Status: DC | PRN

## 2013-08-24 MED ORDER — DIPHENHYDRAMINE HCL 12.5 MG/5ML PO ELIX
12.5000 mg | ORAL_SOLUTION | Freq: Four times a day (QID) | ORAL | Status: DC | PRN
  Filled 2013-08-24: qty 10

## 2013-08-24 MED ORDER — ATORVASTATIN CALCIUM 10 MG PO TABS
10.0000 mg | ORAL_TABLET | Freq: Every day | ORAL | Status: DC
  Administered 2013-08-25 – 2013-08-27 (×3): 10 mg via ORAL
  Filled 2013-08-24 (×5): qty 1

## 2013-08-24 MED ORDER — BRIMONIDINE TARTRATE 0.15 % OP SOLN
1.0000 [drp] | Freq: Two times a day (BID) | OPHTHALMIC | Status: DC
  Filled 2013-08-24: qty 5

## 2013-08-24 MED ORDER — BRIMONIDINE TARTRATE 0.2 % OP SOLN
1.0000 [drp] | Freq: Two times a day (BID) | OPHTHALMIC | Status: DC
  Administered 2013-08-24 – 2013-08-28 (×8): 1 [drp] via OPHTHALMIC
  Filled 2013-08-24: qty 5

## 2013-08-24 MED ORDER — PROCHLORPERAZINE MALEATE 5 MG PO TABS
5.0000 mg | ORAL_TABLET | Freq: Four times a day (QID) | ORAL | Status: DC | PRN
Start: 2013-08-24 — End: 2013-08-28
  Filled 2013-08-24: qty 2

## 2013-08-24 MED ORDER — LATANOPROST 0.005 % OP SOLN
1.0000 [drp] | Freq: Every day | OPHTHALMIC | Status: DC
  Administered 2013-08-24 – 2013-08-26 (×3): 1 [drp] via OPHTHALMIC
  Filled 2013-08-24: qty 2.5

## 2013-08-24 MED ORDER — PROCHLORPERAZINE EDISYLATE 5 MG/ML IJ SOLN
5.0000 mg | Freq: Four times a day (QID) | INTRAMUSCULAR | Status: DC | PRN
  Filled 2013-08-24: qty 2

## 2013-08-24 MED ORDER — DORZOLAMIDE HCL-TIMOLOL MAL 2-0.5 % OP SOLN
1.0000 [drp] | Freq: Two times a day (BID) | OPHTHALMIC | Status: DC
  Administered 2013-08-24 – 2013-08-28 (×8): 1 [drp] via OPHTHALMIC
  Filled 2013-08-24: qty 10

## 2013-08-24 MED ORDER — ADULT MULTIVITAMIN W/MINERALS CH
1.0000 | ORAL_TABLET | Freq: Every morning | ORAL | Status: DC
  Administered 2013-08-25 – 2013-08-28 (×4): 1 via ORAL
  Filled 2013-08-24 (×4): qty 1

## 2013-08-24 MED ORDER — FLEET ENEMA 7-19 GM/118ML RE ENEM: 1.0000 | ENEMA | Freq: Once | RECTAL | Status: AC | PRN

## 2013-08-24 MED ORDER — TRAZODONE HCL 50 MG PO TABS: 25.0000 mg | ORAL_TABLET | Freq: Every evening | ORAL | Status: DC | PRN

## 2013-08-24 MED ORDER — AMLODIPINE BESYLATE 5 MG PO TABS
5.0000 mg | ORAL_TABLET | Freq: Every day | ORAL | Status: DC
  Administered 2013-08-25 – 2013-08-28 (×4): 5 mg via ORAL
  Filled 2013-08-24 (×5): qty 1

## 2013-08-24 MED ORDER — POLYSACCHARIDE IRON COMPLEX 150 MG PO CAPS
150.0000 mg | ORAL_CAPSULE | Freq: Every morning | ORAL | Status: DC
  Administered 2013-08-25 – 2013-08-28 (×4): 150 mg via ORAL
  Filled 2013-08-24 (×4): qty 1

## 2013-08-24 MED ORDER — GUAIFENESIN-DM 100-10 MG/5ML PO SYRP: 5.0000 mL | ORAL_SOLUTION | Freq: Four times a day (QID) | ORAL | Status: DC | PRN

## 2013-08-24 MED ORDER — ALUM & MAG HYDROXIDE-SIMETH 200-200-20 MG/5ML PO SUSP: 30.0000 mL | ORAL | Status: DC | PRN

## 2013-08-24 MED ORDER — CYANOCOBALAMIN 250 MCG PO TABS
250.0000 ug | ORAL_TABLET | Freq: Every morning | ORAL | Status: DC
  Administered 2013-08-25 – 2013-08-28 (×4): 250 ug via ORAL
  Filled 2013-08-24 (×4): qty 1

## 2013-08-24 MED ORDER — PROCHLORPERAZINE 25 MG RE SUPP
12.5000 mg | Freq: Four times a day (QID) | RECTAL | Status: DC | PRN
  Filled 2013-08-24: qty 1

## 2013-08-24 MED ORDER — SENNOSIDES-DOCUSATE SODIUM 8.6-50 MG PO TABS: 1.0000 | ORAL_TABLET | Freq: Every evening | ORAL | Status: DC | PRN

## 2013-08-24 MED ORDER — BISACODYL 10 MG RE SUPP: 10.0000 mg | Freq: Every day | RECTAL | Status: DC | PRN

## 2013-08-24 NOTE — Progress Notes (Signed)
Clinical Education officer, museum (CSW) met with patient's wife Bertis Hustead at bedside. Wife reported that she wants patient to go to CIR. Per CIR admissions coordinator they are taking patient today. Please reconsult if further social work needs arise. CSW signing off.   Blima Rich, [place] Weekend CSW [phone]

## 2013-08-24 NOTE — Progress Notes (Signed)
Discharge orders received, pt for discharge to CIR today, IV D/C,  D/C instructions and Rx given to patient and his wife with verbalized understanding.  Family at bedside to assist pt with discharge. Staff brought pt to CIR via wheelchair.

## 2013-08-24 NOTE — Discharge Summary (Signed)
Physician Discharge Summary  Bryan Moore [id number] DOB: [birth year] DOA: 08/18/2013  PCP: [name], MD  Admit date: 08/18/2013 Discharge date: 08/24/2013  Time spent: 65 minutes  Recommendations for Outpatient Follow-up:  1. followup with Dr. [name] in 2 months. 2. followup with Dr. [name] in about 2-3 weeks. 3. followup with Moore,Bryan D, MD in 2 weeks post discharge from inpatient rehabilitation  Discharge Diagnoses:  Principal Problem:   CVA (cerebral infarction) Active Problems:   Hypertension   Hyperlipidemia   Anemia   Bladder cancer   Encephalopathy   Discharge Condition: Stable and improved  Diet recommendation: Heart healthy  Filed Weights   08/19/13 0000  Weight: 68.8 kg (151 lb 10.8 oz)    History of present illness:  This is a 80 y.o. year old male with multiple medical problems including dementia, HTN, aortic valve disease s/p AVR, recurrent GI bleeds 2/2 AVMs, frequent falls, bladder cancer presenting with confusion x 2-3 days. Level V caveat as pt is acutely confused. Primary history is from wife. Per wife, pt became acutely confused on Sunday. Wife states that pt is otherwise alert and able to perform his ADLs at baseline. Pt has been having intermittent outbursts as well as asking for deceased relatives. Wife denies any recent illnesses. Pt did reportedly fall in the shower on christmas, but there was no direct head trauma. Wife denies any overt hemiparesis, though she does report mild worsening of R sided weakness since Sunday. Pt also with increased urinary frequency. Appetite has been mildly decreased. No reported melena. Not on aspirin. Pt has been seen by urology for recent cystoscopies and bladder cancer resections. Wife denies any hematuria.  In the ER, head CT shows L lacunar infarct in L internal capsule, small vessel ischemic changes. UA with mild changes consistent with infection. CXR with cardiomegaly, otherwise no infiltrate,     Hospital Course:  #1 acute encephalopathy/subacute CVA  likely secondary to acute versus subacute stroke noted on CT of the head and MRI of the head in the setting of underlying dementia. Urine cultures are negative. 2-D echo with no source of emboli. Carotid Dopplers with no significant ICA stenosis. Patient unable to tolerate aspirin secondary to prior history of GI bleed requiring multiple transfusions. PT/OT/ST. Risk factor modification including blood pressure control. Continue statin. Follow up with neurology as outpatient. #2 Bacteruria  Urine cultures are negative. Discontinued antibiotics.  #3 hypertension  Blood pressure improved. Continue Norvasc 5 mg daily. Follow.  #4 anemia  No overt GI bleed. H&H stable.Follow H&H.  #5 hyperlipidemia  LDL is 100. Continued on a statin.  #6 dementia  Stable. Outpatient follow up. Patient more alert after Ativan was discontinued.  #7 history of recurrent bladder cancer  Patient noted to have too numerous to count RBCs on UA likely from bladder tumors. Patient is scheduled for TUR bladder tumor on 08/27/2013. Per neurology recommend scheduling bladder surgery at the end of rehabilitation stay in a few weeks.      Procedures: CT head 08/18/2013  Chest x-ray 08/18/2013  2-D echo 08/19/2013  MRI head 08/19/2013  Carotid Dopplers 08/21/2013   Consultations: Neurology Dr. Doy Mince: 08/19/2013   Discharge Exam: Filed Vitals:   08/24/13 1345  BP: 124/67  Pulse: 61  Temp: 97.4 F (36.3 C)  Resp: 18    General: NAD Cardiovascular: RRR Respiratory: CTAB  Discharge Instructions       Future Appointments Provider Department Dept Phone   08/25/2013 8:00 AM Flint Melter, PT [facility]  [place] [facility] [phone]   08/25/2013 9:15 AM Merrilee Seashore, [place] [facility] [phone]   08/25/2013 10:15 AM Buzzy Han, [place] [facility]  [phone]   08/25/2013 4:30 PM Flint Melter, PT [place] [facility] [phone]       Medication List         acetaminophen 325 MG tablet  Commonly known as:  TYLENOL  Take 650 mg by mouth every 6 (six) hours as needed for moderate pain.     brimonidine 0.1 % Soln  Commonly known as:  ALPHAGAN P  Place 1 drop into both eyes 2 (two) times daily.     cholecalciferol 1000 UNITS tablet  Commonly known as:  VITAMIN D  Take 1,000 Units by mouth every morning.     dorzolamide-timolol 22.3-6.8 MG/ML ophthalmic solution  Commonly known as:  COSOPT  Place 1 drop into both eyes every 12 (twelve) hours.     iron polysaccharides 150 MG capsule  Commonly known as:  NIFEREX  Take 150 mg by mouth every morning.     latanoprost 0.005 % ophthalmic solution  Commonly known as:  XALATAN  Place 1 drop into both eyes at bedtime.     multivitamin with minerals Tabs tablet  Take 1 tablet by mouth every morning.     vitamin B-12 250 MCG tablet  Commonly known as:  CYANOCOBALAMIN  Take 250 mcg by mouth every morning.       Allergies  Allergen Reactions  . Advil [Ibuprofen] Other (See Comments)    Causes stomach bleeding, need transfusions as result  . Aspirin Other (See Comments)    Causes stomach bleeding, needs transfusion as result  . Penicillins Itching and Swelling   Follow-up Information   Follow up with Moore,Bryan D, MD. Schedule an appointment as soon as possible for a visit in 2 weeks.   Specialty:  Endocrinology   Contact information:   [place] [id number] [phone]       Follow up with [name], MD. Schedule an appointment as soon as possible for a visit in 2 weeks.   Specialty:  Urology   Contact information:   [facility]  [place] [address] [phone]       Follow up with Forbes Cellar, MD. Schedule an appointment as soon as possible for a  visit in 2 months.   Specialties:  Neurology, Radiology   Contact information:   [address] [place] [address] [phone]        The results of significant diagnostics from this hospitalization (including imaging, microbiology, ancillary and laboratory) are listed below for reference.    Significant Diagnostic Studies: Dg Chest 2 View  08/18/2013   CLINICAL DATA:  Acute mental status changes.  EXAM: CHEST  2 VIEW  COMPARISON:  One view chest x-ray 07/20/2012 and two-view chest x-ray 12/11/2011.  FINDINGS: AP erect and lateral chest again demonstrate an aortic valve prosthesis which appears appropriately oriented. Cardiac silhouette mildly enlarged but stable. Thoracic aorta mildly tortuous and atherosclerotic, unchanged. Calcified left hilar lymph nodes, unchanged. Lungs clear. Bronchovascular markings normal. Pulmonary vascularity normal. No visible pleural effusions. No pneumothorax. Degenerative changes involving the thoracic spine.  IMPRESSION: Stable mild cardiomegaly.  No acute cardiopulmonary disease.   Electronically Signed   By: Sherran Needs.D.  On: 08/18/2013 19:09   Ct Head Wo Contrast  08/18/2013   CLINICAL DATA:  Altered mental status, baseline dementia, recent fall  EXAM: CT HEAD WITHOUT CONTRAST  TECHNIQUE: Contiguous axial images were obtained from the base of the skull through the vertex without intravenous contrast.  COMPARISON:  08/13/2008  FINDINGS: No evidence of parenchymal hemorrhage or extra-axial fluid collection. No mass lesion, mass effect, or midline shift.  10 mm hypodensity in the left internal capsule (series 2/image 15), suspicious for age indeterminate lacunar infarct, new from 2010.  Subcortical white matter and periventricular small vessel ischemic changes. Mild intracranial atherosclerosis.  Global cortical atrophy.  Stable secondary ventricular prominence.  The visualized paranasal sinuses are essentially clear. The mastoid air cells  are unopacified.  No evidence of calvarial fracture.  IMPRESSION: Suspected 10 mm lacunar infarct in the left internal capsule, age indeterminate, new from 2010.  Atrophy with small vessel ischemic changes.   Electronically Signed   By: Julian Hy M.D.   On: 08/18/2013 18:50   Mr Brain Wo Contrast  08/19/2013   CLINICAL DATA:  Stroke.  2-3 days of worsening confusion.  EXAM: MRI HEAD WITHOUT CONTRAST  MRA HEAD WITHOUT CONTRAST  TECHNIQUE: Multiplanar, multiecho pulse sequences of the brain and surrounding structures were obtained without intravenous contrast. Angiographic images of the head were obtained using MRA technique without contrast.  COMPARISON:  CT head without contrast 08/18/2013.  FINDINGS: MRI HEAD FINDINGS  The diffusion-weighted images confirm an acute nonhemorrhagic infarct involving the left globus pallidus and medial basal ganglia. This infarct measures 18 mm maximally. Associated T2 signal changes are noted.  Moderate generalized atrophy is present. Periventricular and subcortical white matter changes are noted bilaterally. Flow is present in the major intracranial arteries. The patient is status post bilateral lens extractions. The ventricles are proportionate to the degree of atrophy. No significant extra-axial fluid collection is present. Mild degenerative anterolisthesis is present at C3-4.  The patient is status post bilateral lens extractions. Mild mucosal thickening is evident throughout the anterior paranasal sinuses. The sphenoid sinuses are clear. The mastoid air cells are clear.  MRA HEAD FINDINGS  The MRI a is mildly degraded by patient motion. Atherosclerotic changes are noted in the cavernous carotid arteries bilaterally without significant stenosis. The A1 and M1 segments appear to be within normal limits. The MCA bifurcations are intact. Mild small vessel disease is present bilaterally.  The right vertebral artery is slightly dominant to the left. The right PICA origin is  visualized and normal. The left PICA is not visualized. The basilar artery is within normal limits. Both posterior cerebral arteries originate from the basilar tip. There is some attenuation of distal PCA branch vessels bilaterally, left greater than right.  IMPRESSION: 1. A subacute nonhemorrhagic infarct involving the medial left basal ganglia measures 18 mm maximally. T2 signal changes are compatible with the subacute time frame. 2. Moderate generalized atrophy and white matter disease. 3. Mild anterior sinus disease. 4. Atherosclerotic changes within the cavernous carotid arteries without stenosis. 5. Mild diffuse small vessel disease. This likely reflects the sequelae of chronic microvascular ischemia.   Electronically Signed   By: Lawrence Santiago M.D.   On: 08/19/2013 21:15   Mr Jodene Nam Head/brain Wo Cm  08/19/2013   CLINICAL DATA:  Stroke.  2-3 days of worsening confusion.  EXAM: MRI HEAD WITHOUT CONTRAST  MRA HEAD WITHOUT CONTRAST  TECHNIQUE: Multiplanar, multiecho pulse sequences of the brain and surrounding structures were obtained without intravenous contrast. Angiographic images  of the head were obtained using MRA technique without contrast.  COMPARISON:  CT head without contrast 08/18/2013.  FINDINGS: MRI HEAD FINDINGS  The diffusion-weighted images confirm an acute nonhemorrhagic infarct involving the left globus pallidus and medial basal ganglia. This infarct measures 18 mm maximally. Associated T2 signal changes are noted.  Moderate generalized atrophy is present. Periventricular and subcortical white matter changes are noted bilaterally. Flow is present in the major intracranial arteries. The patient is status post bilateral lens extractions. The ventricles are proportionate to the degree of atrophy. No significant extra-axial fluid collection is present. Mild degenerative anterolisthesis is present at C3-4.  The patient is status post bilateral lens extractions. Mild mucosal thickening is evident  throughout the anterior paranasal sinuses. The sphenoid sinuses are clear. The mastoid air cells are clear.  MRA HEAD FINDINGS  The MRI a is mildly degraded by patient motion. Atherosclerotic changes are noted in the cavernous carotid arteries bilaterally without significant stenosis. The A1 and M1 segments appear to be within normal limits. The MCA bifurcations are intact. Mild small vessel disease is present bilaterally.  The right vertebral artery is slightly dominant to the left. The right PICA origin is visualized and normal. The left PICA is not visualized. The basilar artery is within normal limits. Both posterior cerebral arteries originate from the basilar tip. There is some attenuation of distal PCA branch vessels bilaterally, left greater than right.  IMPRESSION: 1. A subacute nonhemorrhagic infarct involving the medial left basal ganglia measures 18 mm maximally. T2 signal changes are compatible with the subacute time frame. 2. Moderate generalized atrophy and white matter disease. 3. Mild anterior sinus disease. 4. Atherosclerotic changes within the cavernous carotid arteries without stenosis. 5. Mild diffuse small vessel disease. This likely reflects the sequelae of chronic microvascular ischemia.   Electronically Signed   By: Lawrence Santiago M.D.   On: 08/19/2013 21:15    Microbiology: Recent Results (from the past 240 hour(s))  URINE CULTURE     Status: None   Collection Time    08/18/13  7:37 PM      Result Value Range Status   Specimen Description URINE, CATHETERIZED   Final   Special Requests NONE   Final   Culture  Setup Time     Final   Value: 08/19/2013 01:01     Performed at [facility]     Final   Value: NO GROWTH     Performed at [facility]   Culture     Final   Value: NO GROWTH     Performed at [facility]   Report Status 08/19/2013 FINAL   Final     Labs: Basic Metabolic Panel:  Recent Labs Lab 08/19/13 0915  08/20/13 0350 08/21/13 0500 08/22/13 0449 08/23/13 0830  NA 139 142 137 141 141  K 4.2 4.7 4.1 4.1 3.9  CL 103 104 101 105 105  CO2 23 26 20 23 23   GLUCOSE 85 95 89 84 157*  BUN 24* 21 17 22  24*  CREATININE 1.16 1.20 1.09 1.12 1.15  CALCIUM 8.9 9.0 8.9 8.7 8.6   Liver Function Tests:  Recent Labs Lab 08/18/13 1819  AST 19  ALT 11  ALKPHOS 76  BILITOT 0.2*  PROT 7.4  ALBUMIN 3.5   No results found for this basename: LIPASE, AMYLASE,  in the last 168 hours  Recent Labs Lab 08/19/13 0510  AMMONIA 37   CBC:  Recent Labs Lab  08/18/13 1819 08/18/13 1928 08/19/13 0915 08/20/13 0350  WBC 4.8  --  4.6 5.9  NEUTROABS 3.8  --   --   --   HGB 10.9* 11.6* 11.5* 11.1*  HCT 32.5* 34.0* 33.2* 33.1*  MCV 96.7  --  93.8 96.2  PLT 172  --  165 151   Cardiac Enzymes: No results found for this basename: CKTOTAL, CKMB, CKMBINDEX, TROPONINI,  in the last 168 hours BNP: BNP (last 3 results) No results found for this basename: PROBNP,  in the last 8760 hours CBG: No results found for this basename: GLUCAP,  in the last 168 hours     Signed:  [facility] MD [facility] 08/24/2013, 4:13 PM

## 2013-08-24 NOTE — H&P (Signed)
Physical Medicine and Rehabilitation Admission H&P    Chief Complaint  Patient presents with  .  right sided weakness, confusion and lethargy.    HPI:  Bryan Moore is a 80 y.o. male with h/o AVM, bladder cancer (pending surgery for TUR recurrent tumor), gait disorder as well as underlying dementia who was admitted 08/18/13 with 2-3 days of worsening confusion. Family reported that he fell on christmas but had no deficits from his fall. His wife noted increased weakness on the right side since 08/15/13 as well as increased urinary frequency. He was started on IV cipro for bacturia and CT head was obtained showing left lacunar infarct in the internal capsule of indeterminate age. Neurology consulted for input and MRI brain done revealing subacute nonhemorrhagic infarct involving the left globus pallidus and medial basal ganglia. 2D echo with EF 45-50% with diffuse hypokinesis, mild MVS, s/p TAVR without stenosis and grade 1 dysfunction. No antithrombotics as patient with h/o GIB on ASA. Patient with rapidly advancing bladder cancer and for bladder surgery by Dr. [name] past rehab.   Patient with right sided weakness, increased sedation and confusion past MRI on 08/19/13.  Sedation resolved but continues. Pt was admitted to CIR today for ongoing therapies.    Review of Systems  Constitutional: Negative for fever and chills.  HENT: Negative for hearing loss.   Respiratory: Negative for cough.   Cardiovascular: Negative for chest pain, palpitations and orthopnea.  Gastrointestinal: Negative for nausea, vomiting and diarrhea.  Musculoskeletal: Negative for back pain, myalgias and neck pain.  Neurological: Positive for dizziness and focal weakness. Negative for headaches.  Psychiatric/Behavioral: Positive for memory loss. The patient is not nervous/anxious.   All other systems reviewed and are negative.   Past Medical History  Diagnosis Date  . Anemia     from AVM's. Requiring transfusion  .  Hyperlipemia   . Aortic stenosis     s/p TAVI July 2012  . Dementia   . Glaucoma   . Mitral regurgitation   . Shingles May 2013    right arm residual pain- last outbreak 1 1/2 yrs ago.  Marland Kitchen Hypertension     Not taking Lisinopril due to BP drops low.  . Transfusion history     last 2 yrs ago.  Marland Kitchen GERD (gastroesophageal reflux disease)     RARE - AND NO MEDS  . Cancer     BLADDER CANCER  . Arthritis   . Balance problem     WEAK ON RIGHT SIDE ( SCOLISOSIS AND HX OF A BACK SURGERY) - BALANCE PROBLEM - "FALLS BACKWARD" - FREQUENT FALLS.  USES CANE WHEN AMBULATING  . Complication of anesthesia     VERY DROWSY FOR HOURS AFTER SURGERY - AND DISORIENTATION  . Heart murmur     PT'S CARDIOLOGIST IS DR. Acie Fredrickson -LAST OFFICE VISIT 03/02/13 IN [facility]   Past Surgical History  Procedure Laterality Date  . Appendectomy    . Cardiac catheterization  10/23/2010    ef 65%  . US echocardiography  08/31/10    EF 55-60%  . Cardiovascular stress test  01/01/2007    EF 64%, NO EVIDENCE OF ISCHEMIA  . Cardiac valve replacement      02/2011([facility])  . Transurethral resection of bladder tumor with gyrus (turbt-gyrus) N/A 04/13/2013    Procedure: TRANSURETHRAL RESECTION OF BLADDER TUMOR WITH GYRUS (TURBT-GYRUS);  Surgeon: [name], MD;  Location: [facility];  Service: Urology;  Laterality: N/A;  . Cystoscopy with stent placement Left  04/13/2013    Procedure:  LEFT URETERAL STENT PLACEMENT;  Surgeon: [name], MD;  Location: [facility];  Service: Urology;  Laterality: Left;  . Back surgery  2010 OR 2011  . Transurethral resection of bladder tumor N/A 06/04/2013    Procedure: RESTAGING TRANSURETHRAL RESECTION OF BLADDER TUMOR (TURBT), URETERAL CATHERIZATION ;  Surgeon: [name], MD;  Location: [facility];  Service: Urology;  Laterality: N/A;  CYSTO RESTAGING TURBT      History reviewed. No pertinent family history.  Social History: Married. Retired Hotel manager. Per reports that he quit smoking about 32 years ago. He  does not have any smokeless tobacco history on file. Per reports that he drinks a glass of wine daily. Per reports that he does not use illicit drugs.     Allergies  Allergen Reactions  . Advil [Ibuprofen] Other (See Comments)    Causes stomach bleeding, need transfusions as result  . Aspirin Other (See Comments)    Causes stomach bleeding, needs transfusion as result  . Penicillins Itching and Swelling   Medications Prior to Admission  Medication Sig Dispense Refill  . acetaminophen (TYLENOL) 325 MG tablet Take 650 mg by mouth every 6 (six) hours as needed for moderate pain.       . brimonidine (ALPHAGAN P) 0.1 % SOLN Place 1 drop into both eyes 2 (two) times daily.       . cholecalciferol (VITAMIN D) 1000 UNITS tablet Take 1,000 Units by mouth every morning.       . dorzolamide-timolol (COSOPT) 22.3-6.8 MG/ML ophthalmic solution Place 1 drop into both eyes every 12 (twelve) hours.       . iron polysaccharides (NIFEREX) 150 MG capsule Take 150 mg by mouth every morning.       . latanoprost (XALATAN) 0.005 % ophthalmic solution Place 1 drop into both eyes at bedtime.       . Multiple Vitamin (MULTIVITAMIN WITH MINERALS) TABS tablet Take 1 tablet by mouth every morning.       . vitamin B-12 (CYANOCOBALAMIN) 250 MCG tablet Take 250 mcg by mouth every morning.         Home: Home Living Family/patient expects to be discharged to:: Private residence Living Arrangements: Spouse/significant other Available Help at Discharge: Family;Available 24 hours/day Type of Home: House Home Access: Stairs to enter [employer] of Steps: 3 Entrance Stairs-Rails: Right Home Layout: Two level;Able to live on main level with bedroom/bathroom Home Equipment: Gilford Rile - 2 wheels;Cane - single point;Shower seat - built in Additional Comments: pt's wife indicates she is only CG for pt.    Lives With: Spouse   Functional History: Prior Function Vocation: Retired  Functional Status:   Mobility:     Ambulation/Gait Engineer, civil (consulting) (Feet): 40 Feet General Gait Details: pt unsteady and requires A for balance and RW management.  pt with posterior lean when attempting to get closer to RW or pt keeps RW far out in front of him and leans out to it.  pt with weakness in Bil knees appearing as they may buckle and trembling in Bil LEs.      ADL: ADL Grooming: Supervision/safety;Set up Where Assessed - Grooming: Supported standing Upper Body Bathing: Set up;Supervision/safety Where Assessed - Upper Body Bathing: Unsupported sitting Lower Body Bathing: Moderate assistance Where Assessed - Lower Body Bathing: Supported sit to stand Upper Body Dressing: Moderate assistance Where Assessed - Upper Body Dressing: Unsupported sitting Lower Body Dressing: Moderate assistance Where Assessed - Lower Body Dressing: Supported sit to  stand Toilet Transfer: Minimal assistance Toilet Transfer Method: Other (comment) (ambulting) Toilet Transfer Equipment: Comfort height toilet Equipment Used: Gait belt;Rolling walker Transfers/Ambulation Related to ADLs: min A. Mod A occ for balance check ADL Comments: min - mod A  Cognition: Cognition Overall Cognitive Status: Impaired/Different from baseline Arousal/Alertness: Lethargic Orientation Level: Oriented to person;Disoriented to time;Disoriented to place;Disoriented to situation Attention: Focused Focused Attention: Impaired Focused Attention Impairment: Verbal basic;Functional basic Memory:  (TBA) Awareness:  (TBA) Problem Solving:  (TBA) Safety/Judgment:  (TBA) Cognition Arousal/Alertness: Awake/alert Behavior During Therapy: Flat affect Overall Cognitive Status: Impaired/Different from baseline Area of Impairment: Orientation;Attention;Memory;Following commands;Safety/judgement;Awareness;Problem solving Orientation Level: Disoriented to;Time;Situation Current Attention Level: Sustained Memory: Decreased recall of  precautions;Decreased short-term memory Following Commands: Follows one step commands with increased time Safety/Judgement: Decreased awareness of safety;Decreased awareness of deficits Awareness: Intellectual Problem Solving: Slow processing;Decreased initiation;Difficulty sequencing;Requires verbal cues;Requires tactile cues General Comments: cognitive status much worse than baseline  Physical Exam: Blood pressure 112/64, pulse 59, temperature 98 F (36.7 C), temperature source Oral, resp. rate 18, height 5' 8" (1.727 m), weight 68.8 kg (151 lb 10.8 oz), SpO2 99.00%. Physical Exam  Nursing note and vitals reviewed. Constitutional:  A little disheveled. Fairly alert  HENT:  Head: Normocephalic and atraumatic.  Right Ear: External ear normal.  Left Ear: External ear normal.  Eyes: Conjunctivae and EOM are normal. Pupils are equal, round, and reactive to light. Right eye exhibits no discharge. Left eye exhibits no discharge. No scleral icterus.  Neck: No JVD present. No tracheal deviation present. No thyromegaly present.  Cardiovascular: Normal rate and regular rhythm.  Exam reveals no gallop and no friction rub.   No murmur heard. Respiratory: No respiratory distress. He has no wheezes. He has no rales. He exhibits no tenderness.  GI: He exhibits no distension and no mass. There is no tenderness. There is no rebound and no guarding.  Lymphadenopathy:    He has no cervical adenopathy.  Neurological:  Slurred speech. Alert  . Able to recall place and city after multiple cues.   Able to follow basic commands with redirection. Right pronator drift and decreased Ogle, mild limb ataxia RUE greater than RLE. Strength grossly 4/5 RUE, 4+ LUE, RLE 3+HF to 4/5 at ankle. LLE is 4 to 4+ throughout.     Psychiatric:  Flat but cooperative, non agitate, non-anxious    Results for orders placed during the hospital encounter of 08/18/13 (from the past 48 hour(s))  BASIC METABOLIC PANEL     Status:  Abnormal   Collection Time    08/23/13  8:30 AM      Result Value Range   Sodium 141  137 - 147 mEq/L   Potassium 3.9  3.7 - 5.3 mEq/L   Chloride 105  96 - 112 mEq/L   CO2 23  19 - 32 mEq/L   Glucose, Bld 157 (*) 70 - 99 mg/dL   BUN 24 (*) 6 - 23 mg/dL   Creatinine, Ser 1.15  0.50 - 1.35 mg/dL   Calcium 8.6  8.4 - 10.5 mg/dL   GFR calc non Af Amer 54 (*) >90 mL/min   GFR calc Af Amer 62 (*) >90 mL/min   Comment: (NOTE)     The eGFR has been calculated using the CKD EPI equation.     This calculation has not been validated in all clinical situations.     eGFR's persistently <90 mL/min signify possible Chronic Kidney     Disease.   No results found.  Post Admission Physician Evaluation: 1. Functional deficits secondary  To:  left globus pallidus and medial basal ganglia infarcts. 2. Patient is admitted to receive collaborative, interdisciplinary care between the physiatrist, rehab nursing staff, and therapy team. 3. Patient's level of medical complexity and substantial therapy needs in context of that medical necessity cannot be provided at a lesser intensity of care such as a SNF. 4. Patient has experienced substantial functional loss from his/her baseline which was documented above under the "Functional History" and "Functional Status" headings.  Judging by the patient's diagnosis, physical exam, and functional history, the patient has potential for functional progress which will result in measurable gains while on inpatient rehab.  These gains will be of substantial and practical use upon discharge  in facilitating mobility and self-care at the household level. 5. Physiatrist will provide 24 hour management of medical needs as well as oversight of the therapy plan/treatment and provide guidance as appropriate regarding the interaction of the two. 6. 24 hour rehab nursing will assist with bladder management, bowel management, safety, skin/wound care, disease management, medication  administration, pain management and patient education  and help integrate therapy concepts, techniques,education, etc. 7. PT will assess and treat for/with: Lower extremity strength, range of motion, stamina, balance, functional mobility, safety, adaptive techniques and equipment, NMR, family education. Cognitive perceptual rx.   Goals are: supervision. 8. OT will assess and treat for/with: ADL's, functional mobility, safety, upper extremity strength, adaptive techniques and equipment, NMR, family education, cognitive perceptual rx.   Goals are: supervision. 9. SLP will assess and treat for/with: cognition, communication.  Goals are: supervision to minimal assist. 10. Case Management and Social Worker will assess and treat for psychological issues and discharge planning. 11. Team conference will be held weekly to assess progress toward goals and to determine barriers to discharge. 12. Patient will receive at least 3 hours of therapy per day at least 5 days per week. 13. ELOS: 7-10 days       14. Prognosis:  excellent   Medical Problem List and Plan: Left basal ganglia infarct  1. H/o GIB on ASA/ Bladder cancer with microhematuria/ DVT Prophylaxis/Anticoagulation: Mechanical: Antiembolism stockings, thigh (TED hose) Bilateral lower extremities Sequential compression devices, below knee Bilateral lower extremities 2. Pain Management:  N/A 3. Mood:  Dementia limits awareness/insight. Will have LCSW follow with patient and wife.  4. Neuropsych: This patient is not capable of making decisions on his own behalf. 5. Anemia: Multifactorial--continue iron supplement. Will recheck in am.  6. HTN: Will continue to monitor with bid checks.  Continue Norvasc 7. Dyslipidemia: continue statin.    Meredith Staggers, MD, [facility]   08/24/2013

## 2013-08-24 NOTE — Progress Notes (Signed)
After pt was discharged to CIR, Dorian Pod (the CIR nurse) called to inform me that the pt was missing his hearing aids.  One hearing aid was found in the room while housekeeping was cleaning.  In the admission documentation, only one hearing aid was documented as being brought in upon admission.  Best boy aware of the situation.

## 2013-08-24 NOTE — PMR Pre-admission (Signed)
PMR Admission Coordinator Pre-Admission Assessment  Patient: Bryan Moore is an 80 y.o., male MRN: [id number] DOB: 02-13-1920 Height: 5\' 8"  (172.7 cm) Weight: 68.8 kg (151 lb 10.8 oz)              Insurance Information HMO: no    PPO:      PCP:      IPA:      80/20:      OTHER:  PRIMARY: Medicare A & B       Policy#: [id number] Subscriber: self CM Name:       Phone#:      Fax#:  Pre-Cert#:       Employer: retired Benefits:  Phone #:      Name: Verified in [place]. Date: 03-07-87 Part A; 08-06-89 Part B      Deduct: $1260      Out of Pocket Max: none      Life Max: unlimited CIR: 100%      SNF: 100% days 1-20, 80% days 21-100 (100 visit limit) Outpatient: 80%     Co-Pay: 20% Home Health: 100%      Co-Pay: none DME: 80%    Co-Pay: 20% Providers: pt's preference  SECONDARY: [employer] [place]      Policy#: [id number]      Subscriber: self CM Name:       Phone#:      Fax#:  Pre-Cert#:       Employer:  Benefits:  Phone [phone]      Name:  Eff. Date:      Deduct:       Out of Pocket Max:       Life Max:  CIR:       SNF:  Outpatient:      Co-Pay:  Home Health:       Co-Pay:  DME:      Co-Pay:    Emergency Contact Information Contact Information   Name Relation Home Work Curtice Son [phone]  [phone]   [place], [place] [id number]  [phone]     Current Medical History  Patient Admitting Diagnosis: left basal ganglia infarct  History of Present Illness: Bryan Moore is a 80 y.o. male with h/o AVM, bladder cancer (pending surgery for TUR recurrent tumor), gait disorder as well as underlying dementia who was admitted 08/18/13 with 2-3 days of worsening confusion. Family reported that he fell on christmas but had no deficits from his fall. His wife noted increased weakness on the right side since 08/15/13 as well as  increased urinary frequency. He was started on IV cipro for bacturia and CT head was obtained showing left lacunar  infarct in the internal capsule of indeterminate age. Neurology consulted for input and MRI brain done revealing subacute nonhemorrhagic infarct involving the left globus pallidus and medial basal ganglia. 2D echo with EF 45-50% with diffuse hypokinesis, mild MVS, s/p TAVR without stenosis and grade 1 dysfunction.  No antithrombotics as patient with h/o GIB on ASA. Bladder surgery on hold. Patient with right sided weakness, increased sedation and confusion past MRI on 08/19/13. Therapies initiated and OT recommending CIR.   NIH Total: 3    Past Medical History  Past Medical History  Diagnosis Date  . Anemia     from AVM's. Requiring transfusion  . Hyperlipemia   . Aortic stenosis     s/p TAVI July 2012  . Dementia   . Glaucoma   . Mitral regurgitation   . Shingles  May 2013    right arm residual pain- last outbreak 1 1/2 yrs ago.  Marland Kitchen Hypertension     Not taking Lisinopril due to BP drops low.  . Transfusion history     last 2 yrs ago.  Marland Kitchen GERD (gastroesophageal reflux disease)     RARE - AND NO MEDS  . Cancer     BLADDER CANCER  . Arthritis   . Balance problem     WEAK ON RIGHT SIDE ( SCOLISOSIS AND HX OF A BACK SURGERY) - BALANCE PROBLEM - "FALLS BACKWARD" - FREQUENT FALLS.  USES CANE WHEN AMBULATING  . Complication of anesthesia     VERY DROWSY FOR HOURS AFTER SURGERY - AND DISORIENTATION  . Heart murmur     PT'S CARDIOLOGIST IS DR. Acie Fredrickson -LAST OFFICE VISIT 03/02/13 IN [facility]    Family History  family history is not on file.  Prior Rehab/Hospitalizations: none   Current Medications  Current facility-administered medications:amLODipine (NORVASC) tablet 5 mg, 5 mg, Oral, Daily, Eugenie Filler, MD, 5 mg at 08/24/13 0947;  atorvastatin (LIPITOR) tablet 10 mg, 10 mg, Oral, q1800, Eugenie Filler, MD, 10 mg at 08/23/13 1731;  brimonidine (ALPHAGAN) 0.15 % ophthalmic solution 1 drop, 1 drop, Both Eyes, BID, Eugenie Filler, MD, 1 drop at 08/24/13 1000 diphenhydrAMINE (BENADRYL)  capsule 25 mg, 25 mg, Oral, Q6H PRN, Eugenie Filler, MD;  dorzolamide-timolol (COSOPT) 22.3-6.8 MG/ML ophthalmic solution 1 drop, 1 drop, Both Eyes, Q12H, Eugenie Filler, MD, 1 drop at 08/24/13 [phone];  hydrALAZINE (APRESOLINE) injection 5 mg, 5 mg, Intravenous, Q6H PRN, Shanda Howells, MD, 5 mg at 08/19/13 0123 iron polysaccharides (NIFEREX) capsule 150 mg, 150 mg, Oral, q morning - 10a, Eugenie Filler, MD, 150 mg at 08/24/13 0947;  latanoprost (XALATAN) 0.005 % ophthalmic solution 1 drop, 1 drop, Both Eyes, QHS, Eugenie Filler, MD, 1 drop at 08/23/13 2136;  multivitamin with minerals tablet 1 tablet, 1 tablet, Oral, q morning - 10a, Eugenie Filler, MD, 1 tablet at 08/24/13 [id number] senna-docusate (Senokot-S) tablet 1 tablet, 1 tablet, Oral, QHS PRN, Shanda Howells, MD;  vitamin B-12 (CYANOCOBALAMIN) tablet 250 mcg, 250 mcg, Oral, q morning - 10a, Eugenie Filler, MD, 250 mcg at 08/24/13 [id number]  Patients Current Diet: Cardiac  Precautions / Restrictions Precautions Precautions: Fall Restrictions Weight Bearing Restrictions: No   Prior Activity Level Community (5-7x/wk): got out nearly everyday with wife (wife does the driving) & is active in [place]. Pt also went with his wife to [facility] every other day and enjoyed using the treadmill/other machines up until one month ago.   Home Assistive Devices / Equipment Home Assistive Devices/Equipment: Cane (specify quad or straight);Eyeglasses;Walker (specify type) Home Equipment: Walker - 2 wheels;Cane - single point;Shower seat - built in  Prior Functional Level Prior Function Level of Independence: Needs assistance Gait / Transfers Assistance Needed: Uses cane ADL's / Homemaking Assistance Needed: Wife performs all homemaking and has everything set-up for pt to perform his ADLs.    Current Functional Level Cognition  Arousal/Alertness: Lethargic Overall Cognitive Status: Impaired/Different from baseline Current Attention Level:  Sustained Orientation Level: Oriented to person;Disoriented to time;Disoriented to place;Disoriented to situation Following Commands: Follows one step commands with increased time Safety/Judgement: Decreased awareness of safety;Decreased awareness of deficits General Comments: cognitive status much worse than baseline Attention: Focused Focused Attention: Impaired Focused Attention Impairment: Verbal basic;Functional basic Memory:  (TBA) Awareness:  (TBA) Problem Solving:  (TBA) Safety/Judgment:  (TBA)    Extremity Assessment (includes  Sensation/Coordination)          ADLs  Grooming: Supervision/safety;Set up Where Assessed - Grooming: Supported standing Upper Body Bathing: Set up;Supervision/safety Where Assessed - Upper Body Bathing: Unsupported sitting Lower Body Bathing: Moderate assistance Where Assessed - Lower Body Bathing: Supported sit to stand Upper Body Dressing: Moderate assistance Where Assessed - Upper Body Dressing: Unsupported sitting Lower Body Dressing: Moderate assistance Where Assessed - Lower Body Dressing: Supported sit to stand Toilet Transfer: Minimal assistance Toilet Transfer Method: Other (comment) (ambulting) Toilet Transfer Equipment: Comfort height toilet Toileting - Clothing Manipulation and Hygiene: Minimal assistance Where Assessed - Toileting Clothing Manipulation and Hygiene: Sit to stand from 3-in-1 or toilet Equipment Used: Gait belt;Rolling walker Transfers/Ambulation Related to ADLs: min A. Mod A occ for balance check ADL Comments: min - mod A    Mobility  Bed Mobility  Overal bed mobility: Needs Assistance  Bed Mobility: Supine to Sit;Sit to Supine  Supine to sit: Mod assist  Sit to supine: Mod assist  General bed mobility comments: cues for initiation and safe technique. pt needs A to bring trunk up to sitting   Transfers  Transfers  Overall transfer level: Needs assistance  Equipment used: Rolling walker (2 wheeled)   Transfers: Sit to/from Stand  Sit to Stand: Min assist  General transfer comment: cues for UE use and anterior wt shift with coming up to stand and controlling descent to sitting.  Ambulation/Gait  Ambulation/Gait assistance: Mod assist  Ambulation Distance (Feet): 40 Feet  Assistive device: Rolling walker (2 wheeled)  Gait Pattern/deviations: Step-through pattern;Decreased stride length;Trunk flexed;Narrow base of support  General Gait Details: pt unsteady and requires A for balance and RW management. pt with posterior lean when attempting to get closer to RW or pt keeps RW far out in front of him and leans out to it. pt with weakness in Bil knees appearing as they may buckle and trembling in Bil LEs.     Ambulation / Gait / Stairs / Pharmacist, hospital (Feet): 40 Feet General Gait Details: pt unsteady and requires A for balance and RW management.  pt with posterior lean when attempting to get closer to RW or pt keeps RW far out in front of him and leans out to it.  pt with weakness in Bil knees appearing as they may buckle and trembling in Bil LEs.      Posture / Balance      Special needs/care consideration BiPAP/CPAP no CPM no Continuous Drip IV no Dialysis no         Life Vest no  Oxygen no Special Bed no Trach Size no Wound Vac (area) no       Skin - small healing spot on L wrist, precancerous lesion removed by dermatologist 3 weeks ago                               Bowel mgmt: - last BM 08-24-13 Bladder mgmt: - using condom cath Diabetic mgmt no   Previous Home Environment Living Arrangements: Spouse/significant other  Lives With: Spouse Available Help at Discharge: Family;Available 24 hours/day Type of Home: House Home Layout: Two level;Able to live on main level with bedroom/bathroom Home Access: Stairs to enter Entrance Stairs-Rails: Right Entrance Stairs-Number of Steps: 3 Bathroom Shower/Tub: Multimedia programmer:  Standard Bathroom Accessibility: Yes How Accessible: Accessible via walker [place]: No Additional Comments: pt's wife indicates she is only  CG for pt.    Discharge Living Setting Plans for Discharge Living Setting: Patient's home Type of Home at Discharge: House (townhouse) Discharge Home Layout: Other (Comment);Able to live on main level with bedroom/bathroom (one and half stories) Discharge Home Access: Stairs to enter Entrance Stairs-Rails: Right Entrance Stairs-Number of Steps: 2 Does the patient have any problems obtaining your medications?: No  Social/Family/Support Systems Patient Roles: Spouse;Volunteer (active in [place]) Contact Information: wife Tarran Piotrowski Anticipated Caregiver: Danton Clap, wife Anticipated Caregiver's Contact Information: home [phone], cell [phone] Ability/Limitations of Caregiver: able to provide 24 hr supervision and contact guard assistance Caregiver Availability: 24/7 Discharge Plan Discussed with Primary Caregiver: Yes Is Caregiver In Agreement with Plan?: Yes Does Caregiver/Family have Issues with Lodging/Transportation while Pt is in Rehab?: Yes  Goals/Additional Needs Patient/Family Goal for Rehab: PT supervision, OT sup to minimal assist, yes SLP Expected length of stay: 7-10 days Cultural Considerations: none Dietary Needs: heart heathly Equipment Needs: to be determined Special Service Needs: wife prefers to stay with pt for the first few nights due to his baseline dementia  Pt/Family Agrees to Admission and willing to participate: Yes Program Orientation Provided & Reviewed with Pt/Caregiver Including Roles  & Responsibilities: Yes   Decrease burden of Care through IP rehab admission: NA  Possible need for SNF placement upon discharge: not anticipated   Patient Condition: This patient's condition remains as documented in the consult dated 08-24-13, in which the Rehabilitation Physician determined and documented  that the patient's condition is appropriate for intensive rehabilitative care in an inpatient rehabilitation facility. Will admit to inpatient rehab today.  Preadmission Screen Completed By:  Anne Hahn, 08/24/2013 11:41 AM ______________________________________________________________________   Discussed status with Dr. Naaman Plummer on 08-24-13 at 11:58 am and received telephone approval for admission today.  Admission Coordinator:  Nanetta Batty, PT, time 11:58 am/Date1-19-15

## 2013-08-24 NOTE — Progress Notes (Signed)
Rehab admissions - I met with pt and his wife and they are interested in pursuing inpatient rehab stay. Bed is available and medically cleared for inpatient rehab per Dr. Grandville Silos. Pt will be admitted to inpatient rehab later today. Please call with any questions.  Thanks, Nanetta Batty, [facility] Admissions Coordinator [phone]

## 2013-08-25 ENCOUNTER — Inpatient Hospital Stay (HOSPITAL_COMMUNITY): Payer: Medicare Other | Admitting: Occupational Therapy

## 2013-08-25 ENCOUNTER — Inpatient Hospital Stay (HOSPITAL_COMMUNITY): Payer: Medicare Other | Admitting: Speech Pathology

## 2013-08-25 ENCOUNTER — Inpatient Hospital Stay (HOSPITAL_COMMUNITY): Payer: Medicare Other | Admitting: *Deleted

## 2013-08-25 ENCOUNTER — Inpatient Hospital Stay (HOSPITAL_COMMUNITY): Admission: RE | Admit: 2013-08-25 | Payer: Medicare Other | Source: Ambulatory Visit

## 2013-08-25 DIAGNOSIS — I10 Essential (primary) hypertension: Secondary | ICD-10-CM

## 2013-08-25 DIAGNOSIS — I6330 Cerebral infarction due to thrombosis of unspecified cerebral artery: Secondary | ICD-10-CM

## 2013-08-25 LAB — CBC WITH DIFFERENTIAL/PLATELET
Basophils Absolute: 0 10*3/uL (ref 0.0–0.1)
Basophils Relative: 0 % (ref 0–1)
Eosinophils Absolute: 0.1 10*3/uL (ref 0.0–0.7)
Eosinophils Relative: 2 % (ref 0–5)
HCT: 33.9 % — ABNORMAL LOW (ref 39.0–52.0)
HEMOGLOBIN: 11.9 g/dL — AB (ref 13.0–17.0)
LYMPHS PCT: 11 % — AB (ref 12–46)
Lymphs Abs: 0.5 10*3/uL — ABNORMAL LOW (ref 0.7–4.0)
MCH: 32.3 pg (ref 26.0–34.0)
MCHC: 35.1 g/dL (ref 30.0–36.0)
MCV: 92.1 fL (ref 78.0–100.0)
MONOS PCT: 11 % (ref 3–12)
Monocytes Absolute: 0.5 10*3/uL (ref 0.1–1.0)
NEUTROS ABS: 3.6 10*3/uL (ref 1.7–7.7)
Neutrophils Relative %: 76 % (ref 43–77)
Platelets: 154 10*3/uL (ref 150–400)
RBC: 3.68 MIL/uL — ABNORMAL LOW (ref 4.22–5.81)
RDW: 14.3 % (ref 11.5–15.5)
WBC: 4.7 10*3/uL (ref 4.0–10.5)

## 2013-08-25 LAB — COMPREHENSIVE METABOLIC PANEL
ALK PHOS: 74 U/L (ref 39–117)
ALT: 8 U/L (ref 0–53)
AST: 16 U/L (ref 0–37)
Albumin: 3 g/dL — ABNORMAL LOW (ref 3.5–5.2)
BILIRUBIN TOTAL: 0.4 mg/dL (ref 0.3–1.2)
BUN: 22 mg/dL (ref 6–23)
CHLORIDE: 101 meq/L (ref 96–112)
CO2: 25 meq/L (ref 19–32)
Calcium: 8.7 mg/dL (ref 8.4–10.5)
Creatinine, Ser: 1.14 mg/dL (ref 0.50–1.35)
GFR calc non Af Amer: 54 mL/min — ABNORMAL LOW (ref >90)
GFR, EST AFRICAN AMERICAN: 63 mL/min — AB (ref >90)
GLUCOSE: 83 mg/dL (ref 70–99)
POTASSIUM: 3.9 meq/L (ref 3.7–5.3)
Sodium: 138 mEq/L (ref 137–147)
Total Protein: 6.9 g/dL (ref 6.0–8.3)

## 2013-08-25 NOTE — Progress Notes (Signed)
Physical Therapy Session Note  Patient Details  Name: Bryan Moore MRN: [id number] Date of Birth: [birth year]  Today's Date: 08/25/2013 Time: [id number] Time Calculation (min): 25 min  Short Term Goals: Week 1:  PT Short Term Goal 1 (Week 1): STGs=LTGs due to ELOS  Skilled Therapeutic Interventions/Progress Updates:    Patient received sitting in wheelchair. Session focused on functional transfers, wheelchair mobility, gait training, and stair negotiation. Patient propelled wheelchair x125' with supervision with B LEs to increase strength and coordination. Patient performed gait training 49' x1 with R HHA and minA and 5 stairs with R handrail ascending, L handrail descending and minA. Performed standing balance activity picking up cones from ground with minA. Repeated sit<>stand transfers x5 without use of B UEs. Patient returned to room and left sitting in wheelchair with seatbelt donned and all needs within reach. Patient re-oriented to use of call bell.  Therapy Documentation Precautions:  Precautions Precautions: Fall Precaution Comments: has dementia at baseline Restrictions Weight Bearing Restrictions: No Pain: Pain Assessment Pain Assessment: No/denies pain Pain Score: 0-No pain Locomotion : Ambulation Ambulation/Gait Assistance: 4: Min assist Wheelchair Mobility Distance: 125   See FIM for current functional status  Therapy/Group: Individual Therapy  Lillia Abed. Nikki Glanzer, PT, DPT 08/25/2013, 5:29 PM

## 2013-08-25 NOTE — Evaluation (Signed)
Occupational Therapy Assessment and Plan  Patient Details  Name: Bryan Moore MRN: [id number] Date of Birth: Sep 06, 1919  OT Diagnosis: cognitive deficits and muscle weakness (generalized) Rehab Potential: Rehab Potential: Good ELOS:   5 days  Today's Date: 08/25/2013 Time: 0800-0857 Time Calculation (min): 57 min  Problem List:  Patient Active Problem List   Diagnosis Date Noted  . CVA (cerebral infarction) 08/19/2013  . Encephalopathy 08/18/2013  . Bladder cancer 04/15/2013  . 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 Frequent falls 07/17/2012  . Fall 12/17/2011  . Atypical chest pain 12/10/2011  . Aortic valve disease 03/28/2011  . Hypertension 03/28/2011  . Hyperlipidemia 03/28/2011  . Anemia 03/28/2011    Past Medical History:  Past Medical History  Diagnosis Date  . Anemia     from AVM's. Requiring transfusion  . Hyperlipemia   . Aortic stenosis     s/p TAVI July 2012  . Dementia   . Glaucoma   . Mitral regurgitation   . Shingles May 2013    right arm residual pain- last outbreak 1 1/2 yrs ago.  Marland Kitchen Hypertension     Not taking Lisinopril due to BP drops low.  . Transfusion history     last 2 yrs ago.  Marland Kitchen GERD (gastroesophageal reflux disease)     RARE - AND NO MEDS  . Cancer     BLADDER CANCER  . Arthritis   . Balance problem     WEAK ON RIGHT SIDE ( SCOLISOSIS AND HX OF A BACK SURGERY) - BALANCE PROBLEM - "FALLS BACKWARD" - FREQUENT FALLS.  USES CANE WHEN AMBULATING  . Complication of anesthesia     VERY DROWSY FOR HOURS AFTER SURGERY - AND DISORIENTATION  . Heart murmur     PT'S CARDIOLOGIST IS DR. Acie Fredrickson -LAST OFFICE VISIT 03/02/13 IN [facility]   Past Surgical History:  Past Surgical History  Procedure Laterality Date  . Appendectomy    . Cardiac catheterization  10/23/2010    ef 65%  . US echocardiography  08/31/10    EF 55-60%  . Cardiovascular stress test  01/01/2007    EF 64%, NO EVIDENCE OF ISCHEMIA  . Cardiac valve replacement      02/2011([facility])  .  Transurethral resection of bladder tumor with gyrus (turbt-gyrus) N/A 04/13/2013    Procedure: TRANSURETHRAL RESECTION OF BLADDER TUMOR WITH GYRUS (TURBT-GYRUS);  Surgeon: [name], MD;  Location: [facility];  Service: Urology;  Laterality: N/A;  . Cystoscopy with stent placement Left 04/13/2013    Procedure:  LEFT URETERAL STENT PLACEMENT;  Surgeon: [name], MD;  Location: [facility];  Service: Urology;  Laterality: Left;  . Back surgery  2010 OR 2011  . Transurethral resection of bladder tumor N/A 06/04/2013    Procedure: RESTAGING TRANSURETHRAL RESECTION OF BLADDER TUMOR (TURBT), URETERAL CATHERIZATION ;  Surgeon: [name], MD;  Location: [facility];  Service: Urology;  Laterality: N/A;  CYSTO RESTAGING TURBT       Assessment & Plan Clinical Impression: Patient is a 80 y.o. year old male h/o AVM, bladder cancer (pending surgery for TUR recurrent tumor), gait disorder as well as underlying dementia who was admitted 08/18/13 with 2-3 days of worsening confusion. Family reported that he fell on christmas but had no deficits from his fall. His wife noted increased weakness on the right side since 08/15/13 as well as increased urinary frequency. He was started on IV cipro for bacturia and CT head was obtained showing left lacunar infarct in the  internal capsule of indeterminate age. Neurology consulted for input and MRI brain done revealing subacute nonhemorrhagic infarct involving the left globus pallidus and medial basal ganglia. 2D echo with EF 45-50% with diffuse hypokinesis, mild MVS, s/p TAVR without stenosis and grade 1 dysfunction. No antithrombotics as patient with h/o GIB on ASA. Patient with rapidly advancing bladder cancer and for bladder surgery by Dr. [name] past rehab. Patient with right sided weakness, increased sedation and confusion past MRI on 08/19/13. Sedation resolved but continues.   Patient transferred to CIR on 08/24/2013 .    Patient currently requires min to mod  A with basic  self-care skills and basic mobility secondary to muscle weakness, decreased cardiorespiratoy endurance, decreased awareness, decreased problem solving, decreased safety awareness and decreased memory and decreased standing balance, decreased postural control and decreased balance strategies.  Prior to hospitalization, patient could complete ADL with modified independent .  Patient will benefit from skilled intervention to decrease level of assist with basic self-care skills and increase independence with basic self-care skills prior to discharge home with care partner.  Anticipate patient will require 24 hour supervision and follow up home health.  OT - End of Session Activity Tolerance: Tolerates 30+ min activity with multiple rests Endurance Deficit: Yes OT Assessment Rehab Potential: Good OT Patient demonstrates impairments in the following area(s): Balance;Behavior;Cognition;Endurance;Motor;Safety OT Basic ADL's Functional Problem(s): Grooming;Bathing;Dressing;Toileting OT Transfers Functional Problem(s): Toilet;Tub/Shower OT Additional Impairment(s): None OT Plan OT Intensity: Minimum of 1-2 x/day, 45 to 90 minutes OT Frequency: 5 out of 7 days OT Treatment/Interventions: Balance/vestibular training;Community reintegration;Cognitive remediation/compensation;Discharge planning;DME/adaptive equipment instruction;Functional mobility training;Pain management;Patient/family education;Self Care/advanced ADL retraining;Splinting/orthotics;Therapeutic Exercise;UE/LE Coordination activities;Psychosocial support;Therapeutic Activities;UE/LE Strength taining/ROM OT Self Feeding Anticipated Outcome(s): no goal set OT Basic Self-Care Anticipated Outcome(s): supervision OT Toileting Anticipated Outcome(s): supervision OT Bathroom Transfers Anticipated Outcome(s): supervision OT Recommendation Patient destination: Home Follow Up Recommendations: Home health OT Equipment Recommended: To be  determined   Skilled Therapeutic Intervention OT eval initiated with pt and pt's wife; OT goals, purpose and role discussed with pt and pt's wife. Self care retraining at shower level with focus on toilet transfer, toileting, sit to stand, short distant functional ambulation with HHA, standing balance with and without UE support, making needs and wants known, sequencing , task organization.   OT Evaluation Precautions/Restrictions  Precautions Precautions: Fall Precaution Comments: has dementia at baseline Restrictions Weight Bearing Restrictions: No General Chart Reviewed: Yes Family/Caregiver Present: Yes (wife) Vital Signs Therapy Vitals BP: 150/66 mmHg Pain Pain Assessment Pain Assessment: No/denies pain Pain Score: 0-No pain Home Living/Prior Functioning Home Living Family/patient expects to be discharged to:: Private residence Living Arrangements: Spouse/significant other Available Help at Discharge: Family;Available 24 hours/day Type of Home: House Home Access: Stairs to enter [employer] of Steps: 3 Entrance Stairs-Rails: Right Home Layout: Two level;Able to live on main level with bedroom/bathroom Additional Comments: pt's wife indicates she is only CG for pt.    Lives With: Spouse Prior Function Level of Independence: Requires assistive device for independence  Able to Take Stairs?: Yes Driving: Yes (very rarely) Vocation: Retired ADL  see FIM Vision/Perception  Vision - History Baseline Vision: Wears glasses only for reading Visual History: Cataracts;Glaucoma Patient Visual Report: No change from baseline Perception Perception: Within Functional Limits Praxis Praxis: Intact  Cognition Overall Cognitive Status: Impaired/Different from baseline Arousal/Alertness: Awake/alert Orientation Level: Oriented to person;Oriented to place;Disoriented to time;Disoriented to situation Attention: Focused;Sustained Focused Attention: Appears  intact Sustained Attention: Appears intact Memory: Impaired Memory Impairment: Decreased recall of new  information;Decreased short term memory Decreased Short Term Memory: Verbal basic;Functional basic Awareness: Impaired Awareness Impairment: Intellectual impairment Problem Solving: Impaired Problem Solving Impairment: Verbal basic;Functional basic Safety/Judgment: Impaired Sensation Sensation Light Touch: Appears Intact Proprioception: Appears Intact Coordination Gross Motor Movements are Fluid and Coordinated: No Fine Motor Movements are Fluid and Coordinated: No Coordination and Movement Description: delayed speed and accuracy Motor  Motor Motor: Abnormal postural alignment and control Motor - Skilled Clinical Observations: posterior trunk lean in standing Mobility  Bed Mobility Bed Mobility: Supine to Sit;Sitting - Scoot to Edge of Bed Supine to Sit: 4: Min assist;HOB elevated;With rails Supine to Sit Details: Verbal cues for sequencing;Verbal cues for technique;Verbal cues for precautions/safety Sitting - Scoot to Edge of Bed: 5: Supervision Sitting - Scoot to Edge of Bed Details: Verbal cues for precautions/safety;Verbal cues for sequencing Transfers Transfers: Sit to Stand;Stand to Sit Sit to Stand: 4: Min assist;From elevated surface;With upper extremity assist Sit to Stand Details: Verbal cues for sequencing;Verbal cues for technique;Verbal cues for precautions/safety;Manual facilitation for weight shifting Stand to Sit: 4: Min assist;With upper extremity assist;With armrests Stand to Sit Details (indicate cue type and reason): Verbal cues for technique;Verbal cues for precautions/safety;Verbal cues for sequencing;Manual facilitation for weight shifting  Trunk/Postural Assessment  Cervical Assessment Cervical Assessment: Within Functional Limits (forward head posture) Thoracic Assessment Thoracic Assessment: Within Functional Limits (kyphotic) Lumbar  Assessment Lumbar Assessment: Within Functional Limits Postural Control Postural Control: Deficits on evaluation Righting Reactions: delayed Postural Limitations: R lateral trunk lean  Balance Balance Balance Assessed: Yes Static Sitting Balance Static Sitting - Balance Support: Bilateral upper extremity supported;Feet supported;Feet unsupported Static Sitting - Level of Assistance: 5: Stand by assistance Static Sitting - Comment/# of Minutes: 2 min Static Standing Balance Static Standing - Balance Support: During functional activity Static Standing - Level of Assistance: 4: Min assist Extremity/Trunk Assessment RUE Assessment RUE Assessment: Within Functional Limits (4/5) LUE Assessment LUE Assessment: Within Functional Limits (4/5)  FIM:  FIM - Grooming Grooming Steps: Wash, rinse, dry face;Wash, rinse, dry hands;Brush, comb hair;Shave or apply make-up Grooming: 5: Set-up assist to adjust water temperature FIM - Bathing Bathing Steps Patient Completed: Chest;Right Arm;Left Arm;Abdomen;Front perineal area;Right upper leg;Left upper leg;Right lower leg (including foot);Left lower leg (including foot) Bathing: 4: Min-Patient completes 8-9 18f 10 parts or 75+ percent FIM - Upper Body Dressing/Undressing Upper body dressing/undressing steps patient completed: Thread/unthread right sleeve of pullover shirt/dresss;Thread/unthread left sleeve of pullover shirt/dress;Put head through opening of pull over shirt/dress;Pull shirt over trunk Upper body dressing/undressing: 5: Set-up assist to: Obtain clothing/put away FIM - Lower Body Dressing/Undressing Lower body dressing/undressing steps patient completed: Thread/unthread right underwear leg;Thread/unthread left underwear leg;Thread/unthread right pants leg;Thread/unthread left pants leg;Pull pants up/down;Don/Doff right sock;Don/Doff left sock;Don/Doff right shoe;Don/Doff left shoe;Fasten/unfasten right shoe;Fasten/unfasten left  shoe;Fasten/unfasten pants Lower body dressing/undressing: 4: Min-Patient completed 75 plus % of tasks FIM - Toileting Toileting steps completed by patient: Adjust clothing prior to toileting;Adjust clothing after toileting Toileting: 3: Mod-Patient completed 2 of 3 steps FIM - Bed/Chair Transfer Bed/Chair Transfer Assistive Devices: Arm rests;HOB elevated Bed/Chair Transfer: 4: Supine > Sit: Min A (steadying Pt. > 75%/lift 1 leg);3: Bed > Chair or W/C: Mod A (lift or lower assist);3: Chair or W/C > Bed: Mod A (lift or lower assist) FIM - Toilet Transfers Toilet Transfers: 4-To toilet/BSC: Min A (steadying Pt. > 75%);4-From toilet/BSC: Min A (steadying Pt. > 75%) FIM - Systems developer Devices: Shower chair;Grab bars Tub/shower Transfers: 4-Into Tub/Shower: Min A (steadying Pt. >  75%/lift 1 leg);4-Out of Tub/Shower: Min A (steadying Pt. > 75%/lift 1 leg)   Refer to Care Plan for Long Term Goals  Recommendations for other services: None  Discharge Criteria: Patient will be discharged from OT if patient refuses treatment 3 consecutive times without medical reason, if treatment goals not met, if there is a change in medical status, if patient makes no progress towards goals or if patient is discharged from hospital.  The above assessment, treatment plan, treatment alternatives and goals were discussed and mutually agreed upon: by patient and by family  Nicoletta Ba 08/25/2013, 10:20 AM

## 2013-08-25 NOTE — H&P (View-Only) (Signed)
Physical Medicine and Rehabilitation Admission H&P    Chief Complaint  Patient presents with  .  right sided weakness, confusion and lethargy.    HPI:  Bryan Moore is a 80 y.o. male with h/o AVM, bladder cancer (pending surgery for TUR recurrent tumor), gait disorder as well as underlying dementia who was admitted 08/18/13 with 2-3 days of worsening confusion. Family reported that he fell on christmas but had no deficits from his fall. His wife noted increased weakness on the right side since 08/15/13 as well as increased urinary frequency. He was started on IV cipro for bacturia and CT head was obtained showing left lacunar infarct in the internal capsule of indeterminate age. Neurology consulted for input and MRI brain done revealing subacute nonhemorrhagic infarct involving the left globus pallidus and medial basal ganglia. 2D echo with EF 45-50% with diffuse hypokinesis, mild MVS, s/p TAVR without stenosis and grade 1 dysfunction. No antithrombotics as patient with h/o GIB on ASA. Patient with rapidly advancing bladder cancer and for bladder surgery by Dr. Wrenn past rehab.   Patient with right sided weakness, increased sedation and confusion past MRI on 08/19/13.  Sedation resolved but continues. Pt was admitted to CIR today for ongoing therapies.    Review of Systems  Constitutional: Negative for fever and chills.  HENT: Negative for hearing loss.   Respiratory: Negative for cough.   Cardiovascular: Negative for chest pain, palpitations and orthopnea.  Gastrointestinal: Negative for nausea, vomiting and diarrhea.  Musculoskeletal: Negative for back pain, myalgias and neck pain.  Neurological: Positive for dizziness and focal weakness. Negative for headaches.  Psychiatric/Behavioral: Positive for memory loss. The patient is not nervous/anxious.   All other systems reviewed and are negative.   Past Medical History  Diagnosis Date  . Anemia     from AVM's. Requiring transfusion  .  Hyperlipemia   . Aortic stenosis     s/p TAVI July 2012  . Dementia   . Glaucoma   . Mitral regurgitation   . Shingles May 2013    right arm residual pain- last outbreak 1 1/2 yrs ago.  . Hypertension     Not taking Lisinopril due to BP drops low.  . Transfusion history     last 2 yrs ago.  . GERD (gastroesophageal reflux disease)     RARE - AND NO MEDS  . Cancer     BLADDER CANCER  . Arthritis   . Balance problem     WEAK ON RIGHT SIDE ( SCOLISOSIS AND HX OF A BACK SURGERY) - BALANCE PROBLEM - "FALLS BACKWARD" - FREQUENT FALLS.  USES CANE WHEN AMBULATING  . Complication of anesthesia     VERY DROWSY FOR HOURS AFTER SURGERY - AND DISORIENTATION  . Heart murmur     PT'S CARDIOLOGIST IS DR. NAHSER -LAST OFFICE VISIT 03/02/13 IN [facility]   Past Surgical History  Procedure Laterality Date  . Appendectomy    . Cardiac catheterization  10/23/2010    ef 65%  . Us echocardiography  08/31/10    EF 55-60%  . Cardiovascular stress test  01/01/2007    EF 64%, NO EVIDENCE OF ISCHEMIA  . Cardiac valve replacement      02/2011([facility])  . Transurethral resection of bladder tumor with gyrus (turbt-gyrus) N/A 04/13/2013    Procedure: TRANSURETHRAL RESECTION OF BLADDER TUMOR WITH GYRUS (TURBT-GYRUS);  Surgeon: John J Wrenn, MD;  Location: [facility];  Service: Urology;  Laterality: N/A;  . Cystoscopy with stent placement Left   04/13/2013    Procedure:  LEFT URETERAL STENT PLACEMENT;  Surgeon: John J Wrenn, MD;  Location: [facility];  Service: Urology;  Laterality: Left;  . Back surgery  2010 OR 2011  . Transurethral resection of bladder tumor N/A 06/04/2013    Procedure: RESTAGING TRANSURETHRAL RESECTION OF BLADDER TUMOR (TURBT), URETERAL CATHERIZATION ;  Surgeon: John Wrenn, MD;  Location: [facility];  Service: Urology;  Laterality: N/A;  CYSTO RESTAGING TURBT      History reviewed. No pertinent family history.  Social History: Married. Retired salesman. Per reports that he quit smoking about 32 years ago. He  does not have any smokeless tobacco history on file. Per reports that he drinks a glass of wine daily. Per reports that he does not use illicit drugs.     Allergies  Allergen Reactions  . Advil [Ibuprofen] Other (See Comments)    Causes stomach bleeding, need transfusions as result  . Aspirin Other (See Comments)    Causes stomach bleeding, needs transfusion as result  . Penicillins Itching and Swelling   Medications Prior to Admission  Medication Sig Dispense Refill  . acetaminophen (TYLENOL) 325 MG tablet Take 650 mg by mouth every 6 (six) hours as needed for moderate pain.       . brimonidine (ALPHAGAN P) 0.1 % SOLN Place 1 drop into both eyes 2 (two) times daily.       . cholecalciferol (VITAMIN D) 1000 UNITS tablet Take 1,000 Units by mouth every morning.       . dorzolamide-timolol (COSOPT) 22.3-6.8 MG/ML ophthalmic solution Place 1 drop into both eyes every 12 (twelve) hours.       . iron polysaccharides (NIFEREX) 150 MG capsule Take 150 mg by mouth every morning.       . latanoprost (XALATAN) 0.005 % ophthalmic solution Place 1 drop into both eyes at bedtime.       . Multiple Vitamin (MULTIVITAMIN WITH MINERALS) TABS tablet Take 1 tablet by mouth every morning.       . vitamin B-12 (CYANOCOBALAMIN) 250 MCG tablet Take 250 mcg by mouth every morning.         Home: Home Living Family/patient expects to be discharged to:: Private residence Living Arrangements: Spouse/significant other Available Help at Discharge: Family;Available 24 hours/day Type of Home: House Home Access: Stairs to enter Entrance Stairs-Number of Steps: 3 Entrance Stairs-Rails: Right Home Layout: Two level;Able to live on main level with bedroom/bathroom Home Equipment: Walker - 2 wheels;Cane - single point;Shower seat - built in Additional Comments: pt's wife indicates she is only CG for pt.    Lives With: Spouse   Functional History: Prior Function Vocation: Retired  Functional Status:   Mobility:     Ambulation/Gait Ambulation Distance (Feet): 40 Feet General Gait Details: pt unsteady and requires A for balance and RW management.  pt with posterior lean when attempting to get closer to RW or pt keeps RW far out in front of him and leans out to it.  pt with weakness in Bil knees appearing as they may buckle and trembling in Bil LEs.      ADL: ADL Grooming: Supervision/safety;Set up Where Assessed - Grooming: Supported standing Upper Body Bathing: Set up;Supervision/safety Where Assessed - Upper Body Bathing: Unsupported sitting Lower Body Bathing: Moderate assistance Where Assessed - Lower Body Bathing: Supported sit to stand Upper Body Dressing: Moderate assistance Where Assessed - Upper Body Dressing: Unsupported sitting Lower Body Dressing: Moderate assistance Where Assessed - Lower Body Dressing: Supported sit to   stand Toilet Transfer: Minimal assistance Toilet Transfer Method: Other (comment) (ambulting) Toilet Transfer Equipment: Comfort height toilet Equipment Used: Gait belt;Rolling walker Transfers/Ambulation Related to ADLs: min A. Mod A occ for balance check ADL Comments: min - mod A  Cognition: Cognition Overall Cognitive Status: Impaired/Different from baseline Arousal/Alertness: Lethargic Orientation Level: Oriented to person;Disoriented to time;Disoriented to place;Disoriented to situation Attention: Focused Focused Attention: Impaired Focused Attention Impairment: Verbal basic;Functional basic Memory:  (TBA) Awareness:  (TBA) Problem Solving:  (TBA) Safety/Judgment:  (TBA) Cognition Arousal/Alertness: Awake/alert Behavior During Therapy: Flat affect Overall Cognitive Status: Impaired/Different from baseline Area of Impairment: Orientation;Attention;Memory;Following commands;Safety/judgement;Awareness;Problem solving Orientation Level: Disoriented to;Time;Situation Current Attention Level: Sustained Memory: Decreased recall of  precautions;Decreased short-term memory Following Commands: Follows one step commands with increased time Safety/Judgement: Decreased awareness of safety;Decreased awareness of deficits Awareness: Intellectual Problem Solving: Slow processing;Decreased initiation;Difficulty sequencing;Requires verbal cues;Requires tactile cues General Comments: cognitive status much worse than baseline  Physical Exam: Blood pressure 112/64, pulse 59, temperature 98 F (36.7 C), temperature source Oral, resp. rate 18, height 5' 8" (1.727 m), weight 68.8 kg (151 lb 10.8 oz), SpO2 99.00%. Physical Exam  Nursing note and vitals reviewed. Constitutional:  A little disheveled. Fairly alert  HENT:  Head: Normocephalic and atraumatic.  Right Ear: External ear normal.  Left Ear: External ear normal.  Eyes: Conjunctivae and EOM are normal. Pupils are equal, round, and reactive to light. Right eye exhibits no discharge. Left eye exhibits no discharge. No scleral icterus.  Neck: No JVD present. No tracheal deviation present. No thyromegaly present.  Cardiovascular: Normal rate and regular rhythm.  Exam reveals no gallop and no friction rub.   No murmur heard. Respiratory: No respiratory distress. He has no wheezes. He has no rales. He exhibits no tenderness.  GI: He exhibits no distension and no mass. There is no tenderness. There is no rebound and no guarding.  Lymphadenopathy:    He has no cervical adenopathy.  Neurological:  Slurred speech. Alert  . Able to recall place and city after multiple cues.   Able to follow basic commands with redirection. Right pronator drift and decreased [place], mild limb ataxia RUE greater than RLE. Strength grossly 4/5 RUE, 4+ LUE, RLE 3+HF to 4/5 at ankle. LLE is 4 to 4+ throughout.     Psychiatric:  Flat but cooperative, non agitate, non-anxious    Results for orders placed during the hospital encounter of 08/18/13 (from the past 48 hour(s))  BASIC METABOLIC PANEL     Status:  Abnormal   Collection Time    08/23/13  8:30 AM      Result Value Range   Sodium 141  137 - 147 mEq/L   Potassium 3.9  3.7 - 5.3 mEq/L   Chloride 105  96 - 112 mEq/L   CO2 23  19 - 32 mEq/L   Glucose, Bld 157 (*) 70 - 99 mg/dL   BUN 24 (*) 6 - 23 mg/dL   Creatinine, Ser 1.15  0.50 - 1.35 mg/dL   Calcium 8.6  8.4 - 10.5 mg/dL   GFR calc non Af Amer 54 (*) >90 mL/min   GFR calc Af Amer 62 (*) >90 mL/min   Comment: (NOTE)     The eGFR has been calculated using the CKD EPI equation.     This calculation has not been validated in all clinical situations.     eGFR's persistently <90 mL/min signify possible Chronic Kidney     Disease.   No results found.  Post Admission Physician Evaluation: 1. Functional deficits secondary  To:  left globus pallidus and medial basal ganglia infarcts. 2. Patient is admitted to receive collaborative, interdisciplinary care between the physiatrist, rehab nursing staff, and therapy team. 3. Patient's level of medical complexity and substantial therapy needs in context of that medical necessity cannot be provided at a lesser intensity of care such as a SNF. 4. Patient has experienced substantial functional loss from his/her baseline which was documented above under the "Functional History" and "Functional Status" headings.  Judging by the patient's diagnosis, physical exam, and functional history, the patient has potential for functional progress which will result in measurable gains while on inpatient rehab.  These gains will be of substantial and practical use upon discharge  in facilitating mobility and self-care at the household level. 5. Physiatrist will provide 24 hour management of medical needs as well as oversight of the therapy plan/treatment and provide guidance as appropriate regarding the interaction of the two. 6. 24 hour rehab nursing will assist with bladder management, bowel management, safety, skin/wound care, disease management, medication  administration, pain management and patient education  and help integrate therapy concepts, techniques,education, etc. 7. PT will assess and treat for/with: Lower extremity strength, range of motion, stamina, balance, functional mobility, safety, adaptive techniques and equipment, NMR, family education. Cognitive perceptual rx.   Goals are: supervision. 8. OT will assess and treat for/with: ADL's, functional mobility, safety, upper extremity strength, adaptive techniques and equipment, NMR, family education, cognitive perceptual rx.   Goals are: supervision. 9. SLP will assess and treat for/with: cognition, communication.  Goals are: supervision to minimal assist. 10. Case Management and Social Worker will assess and treat for psychological issues and discharge planning. 11. Team conference will be held weekly to assess progress toward goals and to determine barriers to discharge. 12. Patient will receive at least 3 hours of therapy per day at least 5 days per week. 13. ELOS: 7-10 days       14. Prognosis:  excellent   Medical Problem List and Plan: Left basal ganglia infarct  1. H/o GIB on ASA/ Bladder cancer with microhematuria/ DVT Prophylaxis/Anticoagulation: Mechanical: Antiembolism stockings, thigh (TED hose) Bilateral lower extremities Sequential compression devices, below knee Bilateral lower extremities 2. Pain Management:  N/A 3. Mood:  Dementia limits awareness/insight. Will have LCSW follow with patient and wife.  4. Neuropsych: This patient is not capable of making decisions on his own behalf. 5. Anemia: Multifactorial--continue iron supplement. Will recheck in am.  6. HTN: Will continue to monitor with bid checks.  Continue Norvasc 7. Dyslipidemia: continue statin.    Khalen Styer T. Aizlynn Digilio, MD, FAAPMR [facility]   08/24/2013 

## 2013-08-25 NOTE — Interval H&P Note (Signed)
Bryan Moore was admitted today to Inpatient Rehabilitation with the diagnosis of CVA.  The patient's history has been reviewed, patient examined, and there is no change in status.  Patient continues to be appropriate for intensive inpatient rehabilitation.  I have reviewed the patient's chart and labs.  Questions were answered to the patient's satisfaction.  This encounter and document were completed on 08/24/14. The H&P is now being placed in the rehab encounter for the purpose of charting.   SWARTZ,ZACHARY T 08/25/2013, 1:36 AM

## 2013-08-25 NOTE — Evaluation (Signed)
Speech Language Pathology Assessment and Plan  Patient Details  Name: Bryan Moore MRN: [id number] Date of Birth: 02/24/1920  SLP Diagnosis: Cognitive Impairments;Dysarthria  Rehab Potential: Excellent ELOS: 1 week   Today's Date: 08/25/2013 Time: 1020-1115 Time Calculation (min): 55 min  Skilled Therapeutic Intervention: Administered cognitive-linguistic evaluation. Please see below for details. Educated the patient and his wife of current cognitive function and goals of skilled SLP intervention. Both verbalized understanding but patient will require reinforcement.   Problem List:  Patient Active Problem List   Diagnosis Date Noted  . CVA (cerebral infarction) 08/19/2013  . Encephalopathy 08/18/2013  . Bladder cancer 04/15/2013  . 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 Frequent falls 07/17/2012  . Fall 12/17/2011  . Atypical chest pain 12/10/2011  . Aortic valve disease 03/28/2011  . Hypertension 03/28/2011  . Hyperlipidemia 03/28/2011  . Anemia 03/28/2011   Past Medical History:  Past Medical History  Diagnosis Date  . Anemia     from AVM's. Requiring transfusion  . Hyperlipemia   . Aortic stenosis     s/p TAVI July 2012  . Dementia   . Glaucoma   . Mitral regurgitation   . Shingles May 2013    right arm residual pain- last outbreak 1 1/2 yrs ago.  Marland Kitchen Hypertension     Not taking Lisinopril due to BP drops low.  . Transfusion history     last 2 yrs ago.  Marland Kitchen GERD (gastroesophageal reflux disease)     RARE - AND NO MEDS  . Cancer     BLADDER CANCER  . Arthritis   . Balance problem     WEAK ON RIGHT SIDE ( SCOLISOSIS AND HX OF A BACK SURGERY) - BALANCE PROBLEM - "FALLS BACKWARD" - FREQUENT FALLS.  USES CANE WHEN AMBULATING  . Complication of anesthesia     VERY DROWSY FOR HOURS AFTER SURGERY - AND DISORIENTATION  . Heart murmur     PT'S CARDIOLOGIST IS DR. Acie Fredrickson -LAST OFFICE VISIT 03/02/13 IN [facility]   Past Surgical History:  Past Surgical History  Procedure Laterality  Date  . Appendectomy    . Cardiac catheterization  10/23/2010    ef 65%  . US echocardiography  08/31/10    EF 55-60%  . Cardiovascular stress test  01/01/2007    EF 64%, NO EVIDENCE OF ISCHEMIA  . Cardiac valve replacement      02/2011([facility])  . Transurethral resection of bladder tumor with gyrus (turbt-gyrus) N/A 04/13/2013    Procedure: TRANSURETHRAL RESECTION OF BLADDER TUMOR WITH GYRUS (TURBT-GYRUS);  Surgeon: [name], MD;  Location: [facility];  Service: Urology;  Laterality: N/A;  . Cystoscopy with stent placement Left 04/13/2013    Procedure:  LEFT URETERAL STENT PLACEMENT;  Surgeon: [name], MD;  Location: [facility];  Service: Urology;  Laterality: Left;  . Back surgery  2010 OR 2011  . Transurethral resection of bladder tumor N/A 06/04/2013    Procedure: RESTAGING TRANSURETHRAL RESECTION OF BLADDER TUMOR (TURBT), URETERAL CATHERIZATION ;  Surgeon: [name], MD;  Location: [facility];  Service: Urology;  Laterality: N/A;  CYSTO RESTAGING TURBT       Assessment / Plan / Recommendation Clinical Impression  Pt is a 80 y.o. male with h/o AVM, bladder cancer (pending surgery for TUR recurrent tumor), gait disorder as well as underlying dementia who was admitted 08/18/13 with 2-3 days of worsening confusion. Family reported that he fell on Christmas but had no deficits from his fall. His wife noted  increased weakness on the right side since 08/15/13 as well as increased urinary frequency. He was started on IV cipro for bacturia and CT head was obtained showing left lacunar infarct in the internal capsule of indeterminate age. Neurology consulted for input and MRI brain done revealing subacute nonhemorrhagic infarct involving the left globus pallidus and medial basal ganglia. 2D echo with EF 45-50% with diffuse hypokinesis, mild MVS, s/p TAVR without stenosis and grade 1 dysfunction. No antithrombotics as patient with h/o GIB on ASA. Patient with rapidly advancing bladder cancer and for  bladder surgery by Dr. [name] past rehab. Patient with right sided weakness, increased sedation and confusion past MRI on 08/19/13. Sedation resolved but continues. Pt was admitted to CIR today for ongoing therapies. Patient transferred to CIR on 08/24/2013 and presents with moderate-severe cognitive impairments characterized by decreased initiation, decreased selective attention, decreased intellectual awareness, decreased functional problem solving, decreased safety awareness, decreased memory and delayed processing which impacts patient's overall safety and functional independence. Pt also demonstrates decreased vocal intensity which impact pt's overall functional communication. Prior to hospitalization, patient was modified independent with mobility and lived with his wife. Patient will benefit from skilled SLP intervention to maximize cognitive function, functional communication and overall functional independence prior to discharge home. Anticipate patient will require 24 hour supervision and would benefit from follow up [place] at discharge.    SLP Assessment  Patient will need skilled [place] [facility] during CIR admission    Recommendations  Oral Care Recommendations: Oral care BID Patient destination: Home Follow up Recommendations: 24 hour supervision/assistance;Home Health SLP Equipment Recommended: None recommended by SLP    SLP Frequency 5 out of 7 days   SLP Treatment/Interventions Cognitive remediation/compensation;Cueing hierarchy;Functional tasks;Environmental controls;Internal/external aids;Speech/Language facilitation;Therapeutic Activities;Patient/family education    Pain No/Denies Pain  Short Term Goals: Week 1: SLP Short Term Goal 1 (Week 1): Pt will utilize an increased vocal intensity at the phrase level with supervision verbal and question cues.  SLP Short Term Goal 2 (Week 1): Pt will utilize external memory aids to recall new, functioanl information with Mod  A multimodal cueing  SLP Short Term Goal 3 (Week 1): Pt will utilize call bell to request assistance with Mod A question and visual cues.  SLP Short Term Goal 4 (Week 1): Pt will identify 1 cognitive deficit with Mod A question cues.  SLP Short Term Goal 5 (Week 1): Pt will demonstrate selective attention to basic and familiar tasks for 30 minutes with Min A cueing for redirection   See FIM for current functional status Refer to Care Plan for Long Term Goals  Recommendations for other services: None  Discharge Criteria: Patient will be discharged from SLP if patient refuses treatment 3 consecutive times without medical reason, if treatment goals not met, if there is a change in medical status, if patient makes no progress towards goals or if patient is discharged from hospital.  The above assessment, treatment plan, treatment alternatives and goals were discussed and mutually agreed upon: by patient and by family  Nyeisha Goodall 08/25/2013, 3:39 PM

## 2013-08-25 NOTE — Progress Notes (Signed)
  Subjective/Complaints: Wife states he slept very well. Patient concurs. Denies pain A 12 point review of systems has been performed and if not noted above is otherwise negative.   Objective: Vital Signs: Blood pressure 156/68, pulse 62, temperature 97.2 F (36.2 C), temperature source Oral, resp. rate 18, weight 69.2 kg (152 lb 8.9 oz), SpO2 97.00%. No results found.  Recent Labs  08/25/13 0633  WBC 4.7  HGB 11.9*  HCT 33.9*  PLT 154    Recent Labs  08/23/13 0830 08/25/13 0633  NA 141 138  K 3.9 3.9  CL 105 101  GLUCOSE 157* 83  BUN 24* 22  CREATININE 1.15 1.14  CALCIUM 8.6 8.7   CBG (last 3)  No results found for this basename: GLUCAP,  in the last 72 hours  Wt Readings from Last 3 Encounters:  08/24/13 69.2 kg (152 lb 8.9 oz)  08/19/13 68.8 kg (151 lb 10.8 oz)  06/04/13 74.435 kg (164 lb 1.6 oz)    Physical Exam:  Constitutional:  A little disheveled. Fairly alert  HENT:  Head: Normocephalic and atraumatic.  Right Ear: External ear normal.  Left Ear: External ear normal.  Eyes: Conjunctivae and EOM are normal. Pupils are equal, round, and reactive to light. Right eye exhibits no discharge. Left eye exhibits no discharge. No scleral icterus.  Neck: No JVD present. No tracheal deviation present. No thyromegaly present.  Cardiovascular: Normal rate and regular rhythm. Exam reveals no gallop and no friction rub.  Slight systolic murmur heard.  Respiratory: No respiratory distress. He has no wheezes. He has no rales. He exhibits no tenderness.  GI: He exhibits no distension and no mass. There is no tenderness. There is no rebound and no guarding.  Lymphadenopathy:  He has no cervical adenopathy.  Neurological:  Slurred speech persists, but fullly intelligible. Alert .  Minimal insight and awareness. Aware that he's in the hospital. Able to follow basic commands with redirection. Right pronator drift and decreased [place], mild limb ataxia RUE greater than RLE.  Strength grossly 4/5 RUE, 4+ LUE, RLE 3+HF to 4/5 at ankle. LLE is 4 to 4+ throughout.   Psychiatric:  Flat but cooperative, non agitate, non-anxious    Assessment/Plan: 1. Functional deficits secondary to left globus pallidus and medial basal ganglia infarcts which require 3+ hours per day of interdisciplinary therapy in a comprehensive inpatient rehab setting. Physiatrist is providing close team supervision and 24 hour management of active medical problems listed below. Physiatrist and rehab team continue to assess barriers to discharge/monitor patient progress toward functional and medical goals. FIM:                                  Medical Problem List and Plan:  Left basal ganglia infarct  1. H/o GIB on ASA/ Bladder cancer with microhematuria/ DVT Prophylaxis/Anticoagulation: Mechanical: Antiembolism stockings, thigh (TED hose) Bilateral lower extremities  Sequential compression devices, below knee Bilateral lower extremities  2. Pain Management: N/A  3. Mood: Dementia at baseline. Will need repeated cueing and supervision for safety. Wife is quite involved 4. Neuropsych: This patient is not capable of making decisions on his own behalf.  5. Anemia: Multifactorial--continue iron supplement. hgb 11.9 6. FEN: push po fluids.   7. Dyslipidemia: continue statin.    LOS (Days) 1 A FACE TO FACE EVALUATION WAS PERFORMED  Fanny Agan T 08/25/2013 7:53 AM

## 2013-08-25 NOTE — Evaluation (Signed)
Physical Therapy Assessment and Plan  Patient Details  Name: Bryan Moore MRN: [id number] Date of Birth: 08-01-1920  PT Diagnosis: Abnormal posture, Abnormality of gait, Cognitive deficits, Coordination disorder, Impaired cognition and Muscle weakness Rehab Potential: Fair ELOS: 5 days   Today's Date: 08/25/2013 Time: 0800-0857 Time Calculation (min): 57 min  Problem List:  Patient Active Problem List   Diagnosis Date Noted  . CVA (cerebral infarction) 08/19/2013  . Encephalopathy 08/18/2013  . Bladder cancer 04/15/2013  . 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 Frequent falls 07/17/2012  . Fall 12/17/2011  . Atypical chest pain 12/10/2011  . Aortic valve disease 03/28/2011  . Hypertension 03/28/2011  . Hyperlipidemia 03/28/2011  . Anemia 03/28/2011    Past Medical History:  Past Medical History  Diagnosis Date  . Anemia     from AVM's. Requiring transfusion  . Hyperlipemia   . Aortic stenosis     s/p TAVI July 2012  . Dementia   . Glaucoma   . Mitral regurgitation   . Shingles May 2013    right arm residual pain- last outbreak 1 1/2 yrs ago.  Marland Kitchen Hypertension     Not taking Lisinopril due to BP drops low.  . Transfusion history     last 2 yrs ago.  Marland Kitchen GERD (gastroesophageal reflux disease)     RARE - AND NO MEDS  . Cancer     BLADDER CANCER  . Arthritis   . Balance problem     WEAK ON RIGHT SIDE ( SCOLISOSIS AND HX OF A BACK SURGERY) - BALANCE PROBLEM - "FALLS BACKWARD" - FREQUENT FALLS.  USES CANE WHEN AMBULATING  . Complication of anesthesia     VERY DROWSY FOR HOURS AFTER SURGERY - AND DISORIENTATION  . Heart murmur     PT'S CARDIOLOGIST IS DR. Acie Fredrickson -LAST OFFICE VISIT 03/02/13 IN [facility]   Past Surgical History:  Past Surgical History  Procedure Laterality Date  . Appendectomy    . Cardiac catheterization  10/23/2010    ef 65%  . US echocardiography  08/31/10    EF 55-60%  . Cardiovascular stress test  01/01/2007    EF 64%, NO EVIDENCE OF ISCHEMIA  . Cardiac  valve replacement      02/2011([facility])  . Transurethral resection of bladder tumor with gyrus (turbt-gyrus) N/A 04/13/2013    Procedure: TRANSURETHRAL RESECTION OF BLADDER TUMOR WITH GYRUS (TURBT-GYRUS);  Surgeon: [name], MD;  Location: [facility];  Service: Urology;  Laterality: N/A;  . Cystoscopy with stent placement Left 04/13/2013    Procedure:  LEFT URETERAL STENT PLACEMENT;  Surgeon: [name], MD;  Location: [facility];  Service: Urology;  Laterality: Left;  . Back surgery  2010 OR 2011  . Transurethral resection of bladder tumor N/A 06/04/2013    Procedure: RESTAGING TRANSURETHRAL RESECTION OF BLADDER TUMOR (TURBT), URETERAL CATHERIZATION ;  Surgeon: [name], MD;  Location: [facility];  Service: Urology;  Laterality: N/A;  CYSTO RESTAGING TURBT       Assessment & Plan Clinical Impression: Bryan Moore is a 80 y.o. male with h/o AVM, bladder cancer (pending surgery for TUR recurrent tumor), gait disorder as well as underlying dementia who was admitted 08/18/13 with 2-3 days of worsening confusion. Family reported that he fell on christmas but had no deficits from his fall. His wife noted increased weakness on the right side since 08/15/13 as well as increased urinary frequency. He was started on IV cipro for bacturia and CT head was obtained  showing left lacunar infarct in the internal capsule of indeterminate age. Neurology consulted for input and MRI brain done revealing subacute nonhemorrhagic infarct involving the left globus pallidus and medial basal ganglia. 2D echo with EF 45-50% with diffuse hypokinesis, mild MVS, s/p TAVR without stenosis and grade 1 dysfunction. No antithrombotics as patient with h/o GIB on ASA. Patient with rapidly advancing bladder cancer and for bladder surgery by Dr. [name] past rehab. Patient with right sided weakness, increased sedation and confusion past MRI on 08/19/13. Sedation resolved but continues. Pt was admitted to CIR today for ongoing therapies.  Patient transferred to CIR on 08/24/2013 .   Patient currently requires min-mod with mobility secondary to muscle weakness, decreased cardiorespiratoy endurance, impaired timing and sequencing, unbalanced muscle activation, decreased coordination and decreased motor planning, decreased motor planning and decreased initiation, decreased attention, decreased awareness, decreased problem solving, decreased safety awareness, decreased memory and delayed processing.  Prior to hospitalization, patient was modified independent  with mobility and lived with Spouse in a House home.  Home access is 3Stairs to enter.  Patient will benefit from skilled PT intervention to maximize safe functional mobility, minimize fall risk and decrease caregiver burden for planned discharge home with 24 hour supervision.  Anticipate patient will benefit from follow up [place] at discharge.  PT - End of Session Activity Tolerance: Tolerates 30+ min activity with multiple rests Endurance Deficit: Yes PT Assessment Rehab Potential: Fair Barriers to Discharge: Decreased caregiver support Barriers to Discharge Comments: Patient's wife is only family able to provide care and can only provide min guard PT Patient demonstrates impairments in the following area(s): Balance;Endurance;Motor;Perception;Safety;Pain;Sensory PT Transfers Functional Problem(s): Bed Mobility;Bed to Chair;Car;Furniture PT Locomotion Functional Problem(s): Ambulation;Wheelchair Mobility;Stairs PT Plan PT Intensity: Minimum of 1-2 x/day ,45 to 90 minutes PT Frequency: 5 out of 7 days PT Duration Estimated Length of Stay: 5 days PT Treatment/Interventions: Ambulation/gait training;Balance/vestibular training;Cognitive remediation/compensation;Community reintegration;Discharge planning;Neuromuscular re-education;Functional mobility training;DME/adaptive equipment instruction;Disease management/prevention;Pain management;Patient/family education;Psychosocial  support;Skin care/wound management;Splinting/orthotics;UE/LE Coordination activities;UE/LE Strength taining/ROM;Therapeutic Exercise;Therapeutic Activities;Wheelchair propulsion/positioning;Stair training;Visual/perceptual remediation/compensation PT Transfers Anticipated Outcome(s): Supervision PT Locomotion Anticipated Outcome(s): supervision gait x150', stairs x3 with one handrial, and 150' wheelchair mobility PT Recommendation Recommendations for Other Services: Speech consult Follow Up Recommendations: Home health PT Patient destination: Home Equipment Recommended: None recommended by PT Equipment Details: Patient owns cane and RW; further DME recommendations TBD upon discharge  Skilled Therapeutic Intervention Patient's wife present for evaluation. Discussed falls risk, safety within room, and focus of therapy during stay. Discussed possible LOS, goals, and follow up therapy. Patient's wife verbalized understanding, no questions at this time. Patient left seated in wheelchair with all needs within reach, seatbelt donned and wife present.  PT Evaluation Precautions/Restrictions Precautions Precautions: Fall Restrictions Weight Bearing Restrictions: No General Chart Reviewed: Yes Family/Caregiver Present: Yes (wife)  Pain Pain Assessment Pain Assessment: No/denies pain Pain Score: 0-No pain Home Living/Prior Functioning Home Living Available Help at Discharge: Family;Available 24 hours/day Type of Home: House Home Access: Stairs to enter [employer] of Steps: 3 Entrance Stairs-Rails: Right Home Layout: Two level;Able to live on main level with bedroom/bathroom Additional Comments: pt's wife indicates she is only CG for pt.    Lives With: Spouse Prior Function Level of Independence: Requires assistive device for independence  Able to Take Stairs?: Yes Driving: Yes (very rarely) Vocation: Retired Vision/Perception  Vision - History Baseline Vision: Wears  glasses only for reading Visual History: Cataracts;Glaucoma (cataracts surgery x2) Patient Visual Report: No change from baseline  Cognition Overall Cognitive  Status: Impaired/Different from baseline Arousal/Alertness: Awake/alert Orientation Level: Oriented to person;Disoriented to place;Disoriented to time;Disoriented to situation Memory: Impaired Awareness: Impaired Problem Solving: Impaired Safety/Judgment: Impaired Sensation Sensation Light Touch: Appears Intact Proprioception: Appears Intact Coordination Gross Motor Movements are Fluid and Coordinated: No Fine Motor Movements are Fluid and Coordinated: No Coordination and Movement Description: delayed speed and accuracy Motor  Motor Motor: Abnormal postural alignment and control Motor - Skilled Clinical Observations: posterior trunk lean in standing  Mobility Bed Mobility Bed Mobility: Supine to Sit;Sitting - Scoot to Edge of Bed Supine to Sit: 4: Min assist;HOB elevated;With rails Supine to Sit Details: Verbal cues for sequencing;Verbal cues for technique;Verbal cues for precautions/safety Sitting - Scoot to Edge of Bed: 5: Supervision Sitting - Scoot to Edge of Bed Details: Verbal cues for precautions/safety;Verbal cues for sequencing Transfers Transfers: Yes Sit to Stand: 3: Mod assist;With armrests;From bed;From chair/3-in-1;With upper extremity assist Sit to Stand Details: Verbal cues for sequencing;Verbal cues for technique;Verbal cues for precautions/safety;Manual facilitation for weight shifting Stand to Sit: 4: Min assist;With armrests;With upper extremity assist;To chair/3-in-1 Stand to Sit Details (indicate cue type and reason): Verbal cues for technique;Verbal cues for precautions/safety;Verbal cues for sequencing;Manual facilitation for weight shifting Stand Pivot Transfers: 3: Mod assist;With armrests Stand Pivot Transfer Details: Verbal cues for technique;Verbal cues for precautions/safety;Verbal cues for  sequencing;Manual facilitation for weight shifting Locomotion  Ambulation Ambulation: Yes Ambulation/Gait Assistance: 4: Min assist Ambulation Distance (Feet): 41 Feet Assistive device: Rolling walker Ambulation/Gait Assistance Details: Verbal cues for precautions/safety;Verbal cues for gait pattern;Verbal cues for safe use of DME/AE;Manual facilitation for weight shifting Ambulation/Gait Assistance Details: 41'x2 in controlled environment. Gait Gait: Yes Gait Pattern: Impaired Gait Pattern: Step-through pattern;Decreased stride length;Trunk flexed;Narrow base of support;Decreased weight shift to right;Shuffle;Decreased trunk rotation Stairs / Additional Locomotion Stairs: Yes Stairs Assistance: 4: Min assist Stairs Assistance Details: Verbal cues for precautions/safety;Verbal cues for sequencing;Tactile cues for weight shifting Stair Management Technique: Two rails;Step to pattern;Forwards Number of Stairs: 5 Height of Stairs: 6 Wheelchair Mobility Wheelchair Mobility: Yes Wheelchair Assistance: 4: Advertising account executive Details: Verbal cues for sequencing;Verbal cues for technique;Tactile cues for sequencing;Verbal cues for precautions/safety Wheelchair Propulsion: Both upper extremities Wheelchair Parts Management: Needs assistance Distance: 45  Trunk/Postural Assessment  Cervical Assessment Cervical Assessment: Within Functional Limits (forward head) Thoracic Assessment Thoracic Assessment: Within Functional Limits (kyphotic) Lumbar Assessment Lumbar Assessment: Within Functional Limits Postural Control Postural Control: Deficits on evaluation Righting Reactions: delayed Postural Limitations: R lateral trunk lean  Balance Balance Balance Assessed: Yes Static Sitting Balance Static Sitting - Balance Support: Bilateral upper extremity supported;Feet supported;Feet unsupported Static Sitting - Level of Assistance: 5: Stand by assistance Static Sitting -  Comment/# of Minutes: 2 min Static Standing Balance Static Standing - Balance Support: Bilateral upper extremity supported;During functional activity Static Standing - Level of Assistance: 4: Min assist Extremity Assessment  RLE Assessment RLE Assessment: Exceptions to [facility] RLE Strength RLE Overall Strength: Deficits;Due to premorbid status RLE Overall Strength Comments: Grossly 3/5 LLE Assessment LLE Assessment: Exceptions to [facility] LLE Strength LLE Overall Strength: Deficits;Due to premorbid status LLE Overall Strength Comments: Grossly 3/5  FIM:  FIM - Control and instrumentation engineer Devices: Arm rests;HOB elevated Bed/Chair Transfer: 4: Supine > Sit: Min A (steadying Pt. > 75%/lift 1 leg);3: Bed > Chair or W/C: Mod A (lift or lower assist);3: Chair or W/C > Bed: Mod A (lift or lower assist) FIM - Locomotion: Wheelchair Distance: 45 Locomotion: Wheelchair: 1: Travels less than 50 ft with minimal assistance (  Pt.>75%) FIM - Locomotion: Ambulation Locomotion: Ambulation Assistive Devices: Administrator Ambulation/Gait Assistance: 4: Min assist Locomotion: Ambulation: 1: Travels less than 50 ft with minimal assistance (Pt.>75%) FIM - Locomotion: Stairs Locomotion: Scientist, physiological: Hand rail - 2 Locomotion: Stairs: 2: Up and Down 4 - 11 stairs with minimal assistance (Pt.>75%)   Refer to Care Plan for Long Term Goals  Recommendations for other services: None  Discharge Criteria: Patient will be discharged from PT if patient refuses treatment 3 consecutive times without medical reason, if treatment goals not met, if there is a change in medical status, if patient makes no progress towards goals or if patient is discharged from hospital.  The above assessment, treatment plan, treatment alternatives and goals were discussed and mutually agreed upon: by patient and by family  Lillia Abed. Cintia Gleed, PT, DPT 08/25/2013, 12:18 PM

## 2013-08-25 NOTE — Progress Notes (Signed)
Patient information reviewed and entered into eRehab system by Mardell Cragg, RN, CRRN, PPS Coordinator.  Information including medical coding and functional independence measure will be reviewed and updated through discharge.     Per nursing patient was given "Data Collection Information Summary for Patients in Inpatient Rehabilitation Facilities with attached "Privacy Act Statement-Health Care Records" upon admission.  

## 2013-08-26 ENCOUNTER — Encounter (HOSPITAL_COMMUNITY): Payer: Medicare Other

## 2013-08-26 ENCOUNTER — Inpatient Hospital Stay (HOSPITAL_COMMUNITY): Payer: Medicare Other | Admitting: Speech Pathology

## 2013-08-26 ENCOUNTER — Inpatient Hospital Stay (HOSPITAL_COMMUNITY): Payer: Medicare Other | Admitting: *Deleted

## 2013-08-26 NOTE — Patient Care Conference (Signed)
Inpatient RehabilitationTeam Conference and Plan of Care Update Date: 08/25/2013   Time: 3:10 PM    Patient Name: Bryan Moore      Medical Record Number: [id number]  Date of Birth: 09-Aug-1919 Sex: Male         Room/Bed: 4M06C/4M06C-01 Payor Info: Payor: MEDICARE / Plan: MEDICARE PART A AND B / Product Type: *No Product type* /    Admitting Diagnosis: L BG Infarct  Admit Date/Time:  08/24/2013  5:16 PM Admission Comments: No comment available   Primary Diagnosis:  <principal problem not specified> Principal Problem: <principal problem not specified>  Patient Active Problem List   Diagnosis Date Noted  . CVA (cerebral infarction) 08/19/2013  . Encephalopathy 08/18/2013  . Bladder cancer 04/15/2013  . 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 Frequent falls 07/17/2012  . Fall 12/17/2011  . Atypical chest pain 12/10/2011  . Aortic valve disease 03/28/2011  . Hypertension 03/28/2011  . Hyperlipidemia 03/28/2011  . Anemia 03/28/2011    Expected Discharge Date: Expected Discharge Date: 08/28/13  Team Members Present: Physician leading conference: Dr. Alger Simons Social Worker Present: Lennart Pall, LCSW Nurse Present: Rozetta Nunnery, RN PT Present: Melene Plan, PT OT Present: Roanna Epley, [place], OT;Karen Otter [place], OT SLP Present: Weston Anna, SLP PPS Coordinator present : Daiva Nakayama, RN, CRRN     Current Status/Progress Goal Weekly Team Focus  Medical   left BG infarct, mild right sided weakness, increased cognitive deficits  family ed, safety education,  cognition, improved sleep wake   Bowel/Bladder   condom cath removed per patient/wife request; LBM 1/17 previous unit  cont of bowel and bladder with min assist  assess continence of bowel/bladder; monitor   Swallow/Nutrition/ Hydration             ADL's   min A/supervision overal, max verbal cues for task initiation and sequencing, decreased safety awareness  supervision overall  family educaiton, dynamic  standing balance, safety awareness   Mobility   minA/supervision overall; max cues for initiation, sequencing, technique, and safety awarenes  supervision overall  family ed, gait, transfers, stair negotiation, balance, safety awaress, strengthening   Communication   Min A  Supervision  increase vocal intensity    Safety/Cognition/ Behavioral Observations  Max A  Min A  orientation, attention, problem solving, awareness    Pain   n/a         Skin   CDI          Rehab Goals Patient on target to meet rehab goals: Yes *See Care Plan and progress notes for long and short-term goals.  Barriers to Discharge: premorbid cognitive deficits    Possible Resolutions to Barriers:  full supervision at dc    Discharge Planning/Teaching Needs:  home with wife to provide 24/7 supervision and light assist if needed      Team Discussion:  New evalwith supervision goals.  Dementia is significant.  Difficult to determine how far from baseline.  Short LOS  Revisions to Treatment Plan:  None   Continued Need for Acute Rehabilitation Level of Care: The patient requires daily medical management by a physician with specialized training in physical medicine and rehabilitation for the following conditions: Daily direction of a multidisciplinary physical rehabilitation program to ensure safe treatment while eliciting the highest outcome that is of practical value to the patient.: Yes Daily medical management of patient stability for increased activity during participation in an intensive rehabilitation regime.: Yes Daily analysis of laboratory values and/or radiology reports with any subsequent  need for medication adjustment of medical intervention for : Neurological problems  Javius Sylla 08/26/2013, 12:35 PM

## 2013-08-26 NOTE — Progress Notes (Signed)
Social Work  Social Work Assessment and Plan  Patient Details  Name: Bryan Moore MRN: [id number] Date of Birth: Apr 13, 1920  Today's Date: 08/26/2013  Problem List:  Patient Active Problem List   Diagnosis Date Noted  . CVA (cerebral infarction) 08/19/2013  . Encephalopathy 08/18/2013  . Bladder cancer 04/15/2013  . 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 Frequent falls 07/17/2012  . Fall 12/17/2011  . Atypical chest pain 12/10/2011  . Aortic valve disease 03/28/2011  . Hypertension 03/28/2011  . Hyperlipidemia 03/28/2011  . Anemia 03/28/2011   Past Medical History:  Past Medical History  Diagnosis Date  . Anemia     from AVM's. Requiring transfusion  . Hyperlipemia   . Aortic stenosis     s/p TAVI July 2012  . Dementia   . Glaucoma   . Mitral regurgitation   . Shingles May 2013    right arm residual pain- last outbreak 1 1/2 yrs ago.  Marland Kitchen Hypertension     Not taking Lisinopril due to BP drops low.  . Transfusion history     last 2 yrs ago.  Marland Kitchen GERD (gastroesophageal reflux disease)     RARE - AND NO MEDS  . Cancer     BLADDER CANCER  . Arthritis   . Balance problem     WEAK ON RIGHT SIDE ( SCOLISOSIS AND HX OF A BACK SURGERY) - BALANCE PROBLEM - "FALLS BACKWARD" - FREQUENT FALLS.  USES CANE WHEN AMBULATING  . Complication of anesthesia     VERY DROWSY FOR HOURS AFTER SURGERY - AND DISORIENTATION  . Heart murmur     PT'S CARDIOLOGIST IS DR. Acie Fredrickson -LAST OFFICE VISIT 03/02/13 IN [facility]   Past Surgical History:  Past Surgical History  Procedure Laterality Date  . Appendectomy    . Cardiac catheterization  10/23/2010    ef 65%  . US echocardiography  08/31/10    EF 55-60%  . Cardiovascular stress test  01/01/2007    EF 64%, NO EVIDENCE OF ISCHEMIA  . Cardiac valve replacement      02/2011([facility])  . Transurethral resection of bladder tumor with gyrus (turbt-gyrus) N/A 04/13/2013    Procedure: TRANSURETHRAL RESECTION OF BLADDER TUMOR WITH GYRUS (TURBT-GYRUS);  Surgeon:  [name], MD;  Location: [facility];  Service: Urology;  Laterality: N/A;  . Cystoscopy with stent placement Left 04/13/2013    Procedure:  LEFT URETERAL STENT PLACEMENT;  Surgeon: [name], MD;  Location: [facility];  Service: Urology;  Laterality: Left;  . Back surgery  2010 OR 2011  . Transurethral resection of bladder tumor N/A 06/04/2013    Procedure: RESTAGING TRANSURETHRAL RESECTION OF BLADDER TUMOR (TURBT), URETERAL CATHERIZATION ;  Surgeon: [name], MD;  Location: [facility];  Service: Urology;  Laterality: N/A;  CYSTO RESTAGING TURBT      Social History:  reports that he quit smoking about 32 years ago. He does not have any smokeless tobacco history on file. He reports that he drinks about 0.6 ounces of alcohol per week. He reports that he does not use illicit drugs.  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  Social History Preferred language: English Religion: Protestant Cultural Background: NA Education: college Read: Yes Write: Yes Employment Status: Retired Date Retired/Disabled/Unemployed: 56 yrs Freight forwarder Issues: none Guardian/Conservator: None, however, per MD, pt not capable of making desicions on his own behalf, therefore, will defer to spouse.   Abuse/Neglect Physical Abuse: Denies Verbal Abuse: Denies Sexual Abuse: Denies Exploitation of patient/patient's resources: Denies Self-Neglect: Denies  Emotional Status Pt's affect, behavior adn  adjustment status: pt with significant cognitive impairment.  Cannot answer questions, however, attempts with such soft voice that it is very difficult to hear and wife notes the information is not correct.  Pt sitting quietly and looks to wife whenever a question is asked of him.  No emotional distress noted outwardly - will monitor through stay. Recent Psychosocial Issues: None per wife Pyschiatric History: None per wife Substance Abuse History: None per wife  Patient / Family Perceptions, Expectations & Goals Pt/Family understanding of illness & functional limitations: Pt with cognitive impairment.  Wife and son with basic understanding of pt's stroke and of new functional limitations which are primarily cognitive. Premorbid pt/family roles/activities: Wife and son report that pt was physically independent, "talkative", "funny" and "gregarious".  They note the "only" cognitive impairment they could see was with memory.  Wife does manage of the household responsibilities. Anticipated changes in roles/activities/participation: Pt with noted significant, cognitive changes which indicate he will need 24/7 supervision, therefore, wife will need to increase amount of care she was providing and possibly hire private duty assistance as well. Pt/family expectations/goals: Wife hopeful pt will improve with cognition.  Feels he is close to baseline from a physical standpoint.  Community Resources [employer]: None Premorbid Home Care/DME Agencies: None Transportation available at discharge: yes Resource referrals recommended: Neuropsychology;Support group (specify)  Discharge Planning Living Arrangements: Spouse/significant other Support Systems: Spouse/significant other;Children Type of Residence: Private residence Insurance Resources: Education officer, museum (specify) Nurse, mental health) Financial Resources: Social Security Financial Screen Referred: No Living Expenses: Own Money Management:  Spouse Does the patient have any problems obtaining your medications?: No Home Management: wife Patient/Family Preliminary Plans: pt to return home with wife who will be providing 24/7 supervision Social Work Anticipated Follow Up Needs: HH/OP;Support Group Expected length of stay: 5 days  Clinical Impression Significantly, cognitively impaired gentleman here following CVA.  Wife at bedside and very involved and supportive.  Notes he is close to baseline from physical standpoint, however, much different cognitively.  Wife aware and planning to provide 24/7 assist at d/c and will likely hire private duty care.    Jeannifer Drakeford 08/26/2013, 12:32 PM

## 2013-08-26 NOTE — Patient Care Conference (Signed)
Inpatient RehabilitationTeam Conference and Plan of Care Update Date: 08/25/2013   Time: 3:10 PM    Patient Name: Bryan Moore      Medical Record Number: [id number]  Date of Birth: [birth year] Sex: Male         Room/Bed: 4M06C/4M06C-01 Payor Info: Payor: MEDICARE / Plan: MEDICARE PART A AND B / Product Type: *No Product type* /    Admitting Diagnosis: L BG Infarct  Admit Date/Time:  08/24/2013  5:16 PM Admission Comments: No comment available   Primary Diagnosis:  <principal problem not specified> Principal Problem: <principal problem not specified>  Patient Active Problem List   Diagnosis Date Noted  . CVA (cerebral infarction) 08/19/2013  . Encephalopathy 08/18/2013  . Bladder cancer 04/15/2013  . 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 Frequent falls 07/17/2012  . Fall 12/17/2011  . Atypical chest pain 12/10/2011  . Aortic valve disease 03/28/2011  . Hypertension 03/28/2011  . Hyperlipidemia 03/28/2011  . Anemia 03/28/2011    Expected Discharge Date: Expected Discharge Date: 08/28/13  Team Members Present: Physician leading conference: Dr. Alger Simons Social Worker Present: Lennart Pall, LCSW Nurse Present: Rozetta Nunnery, RN PT Present: Melene Plan, PT OT Present: Roanna Epley, [place], OT;Karen Rainier, OT SLP Present: Weston Anna, SLP PPS Coordinator present : Daiva Nakayama, RN, CRRN     Current Status/Progress Goal Weekly Team Focus  Medical   left BG infarct, mild right sided weakness, increased cognitive deficits  family ed, safety education,  cognition, improved sleep wake   Bowel/Bladder   condom cath removed per patient/wife request; LBM 1/17 previous unit  cont of bowel and bladder with min assist  assess continence of bowel/bladder; monitor   Swallow/Nutrition/ Hydration             ADL's   min A/supervision overal, max verbal cues for task initiation and sequencing, decreased safety awareness  supervision overall  family educaiton, dynamic  standing balance, safety awareness   Mobility   minA/supervision overall; max cues for initiation, sequencing, technique, and safety awarenes  supervision overall  family ed, gait, transfers, stair negotiation, balance, safety awaress, strengthening   Communication   Min A  Supervision  increase vocal intensity    Safety/Cognition/ Behavioral Observations  Max A  Min A  orientation, attention, problem solving, awareness    Pain   n/a         Skin   CDI          Rehab Goals Patient on target to meet rehab goals: Yes *See Care Plan and progress notes for long and short-term goals.  Barriers to Discharge: premorbid cognitive deficits    Possible Resolutions to Barriers:  full supervision at dc    Discharge Planning/Teaching Needs:  home with wife to provide 24/7 supervision and light assist if needed      Team Discussion:  New eval with goals set for supervision.  Currently min assist overall.  Significant cognitive deficits which are limiting.    Revisions to Treatment Plan:  No   Continued Need for Acute Rehabilitation Level of Care: The patient requires daily medical management by a physician with specialized training in physical medicine and rehabilitation for the following conditions: Daily direction of a multidisciplinary physical rehabilitation program to ensure safe treatment while eliciting the highest outcome that is of practical value to the patient.: Yes Daily medical management of patient stability for increased activity during participation in an intensive rehabilitation regime.: Yes Daily analysis of laboratory values and/or radiology reports with  any subsequent need for medication adjustment of medical intervention for : Neurological problems  [name] 08/26/2013, 2:54 PM

## 2013-08-26 NOTE — Progress Notes (Signed)
Social Work Patient ID: Bryan Moore, male   DOB: 1920/03/11, 80 y.o.   MRN: [id number]  Have spoken with pt's wife and son to review team conference.  Both aware and agreeable with target d/c 1/23.  Son to arrive in [facility] 1/22 pm and will stay through the weekend with pt and wife.  Family education to take place tomorrow and Friday.  Adrianah Prophete, LCSW

## 2013-08-26 NOTE — Progress Notes (Signed)
Physical Therapy Session Note  Patient Details  Name: Bryan Moore MRN: [id number] Date of Birth: October 08, 1919  Today's Date: 08/26/2013 Time: 0800-0900 and 1430-1455 Time Calculation (min): 60 min and 25 min  Short Term Goals: Week 1:  PT Short Term Goal 1 (Week 1): STGs=LTGs due to ELOS  Skilled Therapeutic Interventions/Progress Updates:    AM Session: Patient received sitting upright in bed finishing breakfast, wife present. Session focused on functional transfers, gait training, and stair negotiation. Patient able to thread pants sitting edge of bed, minA for sit<>stand to pull pants up. Patient performed gait training to gym x125' with L HHA and minA for lateral trunk support. Gait training 59' x1 with RW and min guard, progressing to close supervision. Patient demonstrates improved balance and stability with use of RW. Attempted use of SPC per wife's request and patient not utilizing cane correctly, demonstrates poor sequencing and repeatedly switches which hand he is holding it in. RW obstacle course x2, weaving in/out of 4 cones and stepping over 3 canes with minA for balance. Stair negotiation x5 stairs wit minA, R handrail ascending, L handrail descending with step to pattern sideways. 2x5 sit<>stand transfers to RW with close supervision. Patient performs gait training 125' x1 with RW back to room, left seated in wheelchair with all needs within reach and seatbelt donned.  Patient requires max-totalA for verbal cues for safety with sequencing/technique for transfers (hand placement, use of RW appropriately, etc.).  PM Session: Patient received sitting in wheelchair with family members present to observe session. Session focused on functional transfers, gait training, and car transfer. Patient performed gait training >150' x2 with RW and close supervision, 62' x2 of each performed in ADL apartment on carpet to simulate home environment. Patient performed bed mobility: sit<>supine on flat  bed with supervision. Car transfer to [place] [employer] car with RW and supervision, verbal and visual cues for proper sequencing as patient attempts to get into car via single leg stance. Educated patient and wife on safest method of turning around and sitting on seat then swinging legs into car. Patient returned to room and left seated in wheelchair with seatbelt donned and all needs within reach.  Extensive discussion with patient's wife and other family members about recommendations for 24/7 supervision, appropriate assistive device as the RW, not the SPC, etc. Wife and family members verbalized understanding and in agreement with recommendations.  Therapy Documentation Precautions:  Precautions Precautions: Fall Precaution Comments: has dementia at baseline Restrictions Weight Bearing Restrictions: No Pain: Pain Assessment Pain Assessment: No/denies pain Pain Score: 0-No pain Locomotion : Ambulation Ambulation/Gait Assistance: 4: Min guard   See FIM for current functional status  Therapy/Group: Individual Therapy  Lillia Abed. Nari Vannatter, PT, DPT 08/26/2013, 9:05 AM

## 2013-08-26 NOTE — Progress Notes (Signed)
Social Work Lennart Pall, LCSW Social Worker Signed  Patient Care Conference Service date: 08/26/2013 2:54 PM  Inpatient RehabilitationTeam Conference and Plan of Care Update Date: 08/25/2013   Time: 3:10 PM     Patient Name: Bryan Moore       Medical Record Number: [id number]   Date of Birth: 1919/11/17 Sex: Male         Room/Bed: 4M06C/4M06C-01 Payor Info: Payor: MEDICARE / Plan: MEDICARE PART A AND B / Product Type: *No Product type* /   Admitting Diagnosis: L BG Infarct   Admit Date/Time:  08/24/2013  5:16 PM Admission Comments: No comment available   Primary Diagnosis:  <principal problem not specified> Principal Problem: <principal problem not specified>    Patient Active Problem List     Diagnosis  Date Noted   .  CVA (cerebral infarction)  08/19/2013   .  Encephalopathy  08/18/2013   .  Bladder cancer  04/15/2013   .  Sundown syndrome  04/15/2013   .  Frequent falls  07/17/2012   .  Fall  12/17/2011   .  Atypical chest pain  12/10/2011   .  Aortic valve disease  03/28/2011   .  Hypertension  03/28/2011   .  Hyperlipidemia  03/28/2011   .  Anemia  03/28/2011     Expected Discharge Date: Expected Discharge Date: 08/28/13  Team Members Present: Physician leading conference: Dr. Alger Simons Social Worker Present: Lennart Pall, LCSW Nurse Present: Rozetta Nunnery, RN PT Present: Melene Plan, PT OT Present: Roanna Epley, [place], OT;Karen Anthony, OT SLP Present: Weston Anna, SLP PPS Coordinator present : Daiva Nakayama, RN, CRRN        Current Status/Progress  Goal  Weekly Team Focus   Medical     left BG infarct, mild right sided weakness, increased cognitive deficits  family ed, safety education,  cognition, improved sleep wake   Bowel/Bladder     condom cath removed per patient/wife request; LBM 1/17 previous unit  cont of bowel and bladder with min assist  assess continence of bowel/bladder; monitor   Swallow/Nutrition/ Hydration            ADL's      min A/supervision overal, max verbal cues for task initiation and sequencing, decreased safety awareness  supervision overall  family educaiton, dynamic standing balance, safety awareness   Mobility     minA/supervision overall; max cues for initiation, sequencing, technique, and safety awarenes  supervision overall  family ed, gait, transfers, stair negotiation, balance, safety awaress, strengthening   Communication     Min A  Supervision  increase vocal intensity    Safety/Cognition/ Behavioral Observations    Max A  Min A  orientation, attention, problem solving, awareness    Pain     n/a       Skin     CDI        Rehab Goals Patient on target to meet rehab goals: Yes *See Care Plan and progress notes for long and short-term goals.    Barriers to Discharge:  premorbid cognitive deficits      Possible Resolutions to Barriers:    full supervision at dc      Discharge Planning/Teaching Needs:    home with wife to provide 24/7 supervision and light assist if needed      Team Discussion:    New eval with goals set for supervision.  Currently min assist overall.  Significant cognitive deficits which are limiting.  Revisions to Treatment Plan:    No    Continued Need for Acute Rehabilitation Level of Care: The patient requires daily medical management by a physician with specialized training in physical medicine and rehabilitation for the following conditions: Daily direction of a multidisciplinary physical rehabilitation program to ensure safe treatment while eliciting the highest outcome that is of practical value to the patient.: Yes Daily medical management of patient stability for increased activity during participation in an intensive rehabilitation regime.: Yes Daily analysis of laboratory values and/or radiology reports with any subsequent need for medication adjustment of medical intervention for : Neurological problems  Sonu Kruckenberg 08/26/2013, 2:54 PM           Patient ID: Bryan Moore, male   DOB: 1919/11/21, 81 y.o.   MRN: [id number]

## 2013-08-26 NOTE — IPOC Note (Signed)
Overall Plan of Care [facility]) Patient Details Name: Bryan Moore MRN: [id number] DOB: 04/04/1920  Admitting Diagnosis: L BG Infarct  Hospital Problems: Active Problems:   CVA (cerebral infarction)     Functional Problem List: Nursing Bladder;Bowel;Medication Management;Safety  PT Balance;Endurance;Motor;Perception;Safety;Pain;Sensory  OT Balance;Behavior;Cognition;Endurance;Motor;Safety  SLP Cognition  TR         Basic ADL's: OT Grooming;Bathing;Dressing;Toileting     Advanced  ADL's: OT       Transfers: PT Bed Mobility;Bed to Chair;Car;Furniture  OT Toilet;Tub/Shower     Locomotion: PT Ambulation;Wheelchair Mobility;Stairs     Additional Impairments: OT None  SLP Communication;Social Cognition expression Social Interaction;Problem Solving;Memory;Attention;Awareness  TR      Anticipated Outcomes Item Anticipated Outcome  Self Feeding no goal set  Swallowing      Basic self-care  supervision  Toileting  supervision   Bathroom Transfers supervision  Bowel/Bladder  Continent Bowel/Bladder w/ Min assist  Transfers  Supervision  Locomotion  supervision gait x150', stairs x3 with one handrial, and 150' wheelchair mobility  Communication  supervision  Cognition  Min-Mod A  Pain  Pain < 3-10  Safety/Judgment   remains free of falls and or injury   Therapy Plan: PT Intensity: Minimum of 1-2 x/day ,45 to 90 minutes PT Frequency: 5 out of 7 days PT Duration Estimated Length of Stay: 5 days OT Intensity: Minimum of 1-2 x/day, 45 to 90 minutes OT Frequency: 5 out of 7 days OT Duration/Estimated Length of Stay: 5 days SLP Intensity: Minumum of 1-2 x/day, 30 to 90 minutes SLP Frequency: 5 out of 7 days SLP Duration/Estimated Length of Stay: 1 week       Team Interventions: Nursing Interventions Bladder Management;Bowel Management;Pain Management;Medication Management;Psychosocial Support  PT interventions Ambulation/gait training;Balance/vestibular  training;Cognitive remediation/compensation;Community reintegration;Discharge planning;Neuromuscular re-education;Functional mobility training;DME/adaptive equipment instruction;Disease management/prevention;Pain management;Patient/family education;Psychosocial support;Skin care/wound management;Splinting/orthotics;UE/LE Coordination activities;UE/LE Strength taining/ROM;Therapeutic Exercise;Therapeutic Activities;Wheelchair propulsion/positioning;Stair training;Visual/perceptual remediation/compensation  OT Interventions Balance/vestibular training;Community reintegration;Cognitive remediation/compensation;Discharge planning;DME/adaptive equipment instruction;Functional mobility training;Pain management;Patient/family education;Self Care/advanced ADL retraining;Splinting/orthotics;Therapeutic Exercise;UE/LE Coordination activities;Psychosocial support;Therapeutic Activities;UE/LE Strength taining/ROM  SLP Interventions Cognitive remediation/compensation;Cueing hierarchy;Functional tasks;Environmental controls;Internal/external aids;Speech/Language facilitation;Therapeutic Activities;Patient/family education  TR Interventions    SW/CM Interventions Discharge Planning;Psychosocial Support;Patient/Family Education    Team Discharge Planning: Destination: PT-Home ,OT- Home , SLP-Home Projected Follow-up: PT-Home health PT, OT-  Home health OT, SLP-24 hour supervision/assistance;Home Health SLP Projected Equipment Needs: PT-None recommended by PT, OT- To be determined, SLP-None recommended by SLP Equipment Details: PT-Patient owns cane and RW; further DME recommendations TBD upon discharge, OT-  Patient/family involved in discharge planning: PT- Patient;Family member/caregiver,  OT-Patient;Family member/caregiver, SLP-Patient;Family member/caregiver  MD ELOS: 5 days Medical Rehab Prognosis:  Good Assessment: The patient has been admitted for CIR therapies. The team will be addressing, functional  mobility, strength, stamina, balance, safety, adaptive techniques/equipment, self-care, bowel and bladder mgt, patient and caregiver education, NMR, cognitive perceptual rx, family education. Goals have been set at supervision for basic self-care and mobility and min to mod assist with cognition.    Meredith Staggers, MD, FAAPMR      See Team Conference Notes for weekly updates to the plan of care

## 2013-08-26 NOTE — Plan of Care (Signed)
Problem: RH Memory Goal: LTG Patient demonstrate ability for day to day recall (PT) LTG: Patient will demonstrate ability for day to day recall/carryover during mobility activities with assist (PT)  goal modified 08/26/13 due to baseline dementia and cognitive deficits

## 2013-08-26 NOTE — Progress Notes (Signed)
Subjective/Complaints: Had an episode of disorientation and agitation last night. Seems to have settled down nicely. A 12 point review of systems has been performed and if not noted above is otherwise negative.   Objective: Vital Signs: Blood pressure 149/82, pulse 78, temperature 96.8 F (36 C), temperature source Oral, resp. rate 18, weight 69.2 kg (152 lb 8.9 oz), SpO2 94.00%. No results found.  Recent Labs  08/25/13 0633  WBC 4.7  HGB 11.9*  HCT 33.9*  PLT 154    Recent Labs  08/23/13 0830 08/25/13 0633  NA 141 138  K 3.9 3.9  CL 105 101  GLUCOSE 157* 83  BUN 24* 22  CREATININE 1.15 1.14  CALCIUM 8.6 8.7   CBG (last 3)  No results found for this basename: GLUCAP,  in the last 72 hours  Wt Readings from Last 3 Encounters:  08/24/13 69.2 kg (152 lb 8.9 oz)  08/19/13 68.8 kg (151 lb 10.8 oz)  06/04/13 74.435 kg (164 lb 1.6 oz)    Physical Exam:  Constitutional:  NAD HENT:  Head: Normocephalic and atraumatic.  Right Ear: External ear normal.  Left Ear: External ear normal.  Eyes: Conjunctivae and EOM are normal. Pupils are equal, round, and reactive to light. Right eye exhibits no discharge. Left eye exhibits no discharge. No scleral icterus.  Neck: No JVD present. No tracheal deviation present. No thyromegaly present.  Cardiovascular: Normal rate and regular rhythm. Exam reveals no gallop and no friction rub.  Slight systolic murmur heard.  Respiratory: No respiratory distress. He has no wheezes. He has no rales. He exhibits no tenderness.  GI: He exhibits no distension and no mass. There is no tenderness. There is no rebound and no guarding.  Lymphadenopathy:  He has no cervical adenopathy.  Neurological:  Slurred speech persists, but fullly intelligible. Alert .  Minimal insight and awareness. Aware that he's in the hospital. Able to follow basic commands with redirection. Right pronator drift and decreased Johnson, mild limb ataxia RUE greater than RLE.  Strength grossly 4/5 RUE, 4+ LUE, RLE 3+HF to 4/5 at ankle. LLE is 4 to 4+ throughout.   Psychiatric:  Flat   non agitated, non-anxious    Assessment/Plan: 1. Functional deficits secondary to left globus pallidus and medial basal ganglia infarcts which require 3+ hours per day of interdisciplinary therapy in a comprehensive inpatient rehab setting. Physiatrist is providing close team supervision and 24 hour management of active medical problems listed below. Physiatrist and rehab team continue to assess barriers to discharge/monitor patient progress toward functional and medical goals.  Wife and family ok with dc this Friday apparently. Will follow up with SW regarding discussion with son. FIM: FIM - Bathing Bathing Steps Patient Completed: Chest;Right Arm;Left Arm;Abdomen;Front perineal area;Right upper leg;Left upper leg;Right lower leg (including foot);Left lower leg (including foot) Bathing: 4: Min-Patient completes 8-9 3f 10 parts or 75+ percent  FIM - Upper Body Dressing/Undressing Upper body dressing/undressing steps patient completed: Thread/unthread right sleeve of pullover shirt/dresss;Thread/unthread left sleeve of pullover shirt/dress;Put head through opening of pull over shirt/dress;Pull shirt over trunk Upper body dressing/undressing: 5: Set-up assist to: Obtain clothing/put away FIM - Lower Body Dressing/Undressing Lower body dressing/undressing steps patient completed: Thread/unthread right underwear leg;Thread/unthread left underwear leg;Thread/unthread right pants leg;Thread/unthread left pants leg;Pull pants up/down;Don/Doff right sock;Don/Doff left sock;Don/Doff right shoe;Don/Doff left shoe;Fasten/unfasten right shoe;Fasten/unfasten left shoe;Fasten/unfasten pants Lower body dressing/undressing: 4: Min-Patient completed 75 plus % of tasks  FIM - Toileting Toileting steps completed by patient: Adjust clothing prior  to toileting;Adjust clothing after toileting Toileting:  3: Mod-Patient completed 2 of 3 steps  FIM - Toilet Transfers Toilet Transfers: 4-To toilet/BSC: Min A (steadying Pt. > 75%);4-From toilet/BSC: Min A (steadying Pt. > 75%)  FIM - Bed/Chair Transfer Bed/Chair Transfer Assistive Devices: Arm rests Bed/Chair Transfer: 4: Bed > Chair or W/C: Min A (steadying Pt. > 75%);4: Chair or W/C > Bed: Min A (steadying Pt. > 75%)  FIM - Locomotion: Wheelchair Distance: 125 Locomotion: Wheelchair: 2: Travels 50 - 149 ft with supervision, cueing or coaxing FIM - Locomotion: Ambulation Locomotion: Ambulation Assistive Devices: Other (comment) (R HHA) Ambulation/Gait Assistance: 4: Min assist Locomotion: Ambulation: 2: Travels 50 - 149 ft with minimal assistance (Pt.>75%)  Comprehension Comprehension Mode: Auditory Comprehension: 2-Understands basic 25 - 49% of the time/requires cueing 51 - 75% of the time  Expression Expression Mode: Verbal Expression: 2-Expresses basic 25 - 49% of the time/requires cueing 50 - 75% of the time. Uses single words/gestures.  Social Interaction Social Interaction: 2-Interacts appropriately 25 - 49% of time - Needs frequent redirection.  Problem Solving Problem Solving: 2-Solves basic 25 - 49% of the time - needs direction more than half the time to initiate, plan or complete simple activities  Memory Memory: 2-Recognizes or recalls 25 - 49% of the time/requires cueing 51 - 75% of the time  Medical Problem List and Plan:  Left basal ganglia infarct  1. H/o GIB on ASA/ Bladder cancer with microhematuria/ DVT Prophylaxis/Anticoagulation: Mechanical: Antiembolism stockings, thigh (TED hose) Bilateral lower extremities  Sequential compression devices, below knee Bilateral lower extremities  2. Pain Management: N/A  3. Mood: Dementia at baseline. Discussed effects of hospital and stroke on baseline dementia---wife expressed an understanding 4. Neuropsych: This patient is not capable of making decisions on his own  behalf.  5. Anemia: Multifactorial--continue iron supplement. hgb 11.9 6. FEN: push po fluids.   7. Dyslipidemia: continue statin.    LOS (Days) 2 A FACE TO FACE EVALUATION WAS PERFORMED  Aric Jost T 08/26/2013 7:57 AM

## 2013-08-26 NOTE — Progress Notes (Signed)
Speech Language Pathology Daily Session Note  Patient Details  Name: Bryan Moore MRN: [id number] Date of Birth: [birth year]  Today's Date: 08/26/2013 Time: 1430-1455 Time Calculation (min): 25 min  Short Term Goals: Week 1: SLP Short Term Goal 1 (Week 1): Pt will utilize an increased vocal intensity at the phrase level with supervision verbal and question cues.  SLP Short Term Goal 2 (Week 1): Pt will utilize external memory aids to recall new, functioanl information with Mod A multimodal cueing  SLP Short Term Goal 3 (Week 1): Pt will utilize call bell to request assistance with Mod A question and visual cues.  SLP Short Term Goal 4 (Week 1): Pt will identify 1 cognitive deficit with Mod A question cues.  SLP Short Term Goal 5 (Week 1): Pt will demonstrate selective attention to basic and familiar tasks for 30 minutes with Min A cueing for redirection   Skilled Therapeutic Interventions: Skilled therapeutic intervention focused on orientation and speech intelligibility.  Patient needed Max multi-modal cues to utilize external aids for orientation and Max verbal cues to utilize increased vcal intensity which increased intelligibility.      FIM:  Comprehension Comprehension Mode: Auditory Comprehension: 2-Understands basic 25 - 49% of the time/requires cueing 51 - 75% of the time Expression Expression Mode: Verbal Expression: 2-Expresses basic 25 - 49% of the time/requires cueing 50 - 75% of the time. Uses single words/gestures. Social Interaction Social Interaction: 2-Interacts appropriately 25 - 49% of time - Needs frequent redirection. Problem Solving Problem Solving: 2-Solves basic 25 - 49% of the time - needs direction more than half the time to initiate, plan or complete simple activities Memory Memory: 2-Recognizes or recalls 25 - 49% of the time/requires cueing 51 - 75% of the time  Pain Pain Assessment Pain Assessment: No/denies pain  Therapy/Group: Individual  Therapy  Carmelia Roller., CCC-SLP [id number]  [place] 08/26/2013, 4:36 PM

## 2013-08-26 NOTE — Progress Notes (Signed)
Occupational Therapy Session Note  Patient Details  Name: Bryan Moore MRN: [id number] Date of Birth: [birth year]  Today's Date: 08/26/2013 Time: 1015-1130 Time Calculation (min): 75 min  Short Term Goals: Week 1:  OT Short Term Goal 1 (Week 1): STG=LTG  Skilled Therapeutic Interventions/Progress Updates:    Pt resting in w/c with QRB in place and wife present.  Pt agreeable to bathing at shower level and dressing with sit<>stand from w/c.  Pt amb with RW to bathroom and transferred to toilet to void prior to transfer to shower. Pt completed bathing tasks with sit<>stand from shower chair using grab bars for support.  Pt ambulated with RW back into room and completed dressing tasks.  Pt required max verbal cues through session for task initiation and sequencing.  Pt required max verbal cues for safety awareness with ambulation and transfers.  Pt required extra time to complete all tasks.  Pt engaged in functional ambulation with RW in room for home management tasks requiring max verbal cues for task initiation and safety awareness.  Pt's wife was present and observed throughout session.  Therapy Documentation Precautions:  Precautions Precautions: Fall Precaution Comments: has dementia at baseline Restrictions Weight Bearing Restrictions: No   Pain:  Pt denies pain  See FIM for current functional status  Therapy/Group: Individual Therapy  Leroy Libman 08/26/2013, 2:55 PM

## 2013-08-26 NOTE — Discharge Instructions (Signed)
Inpatient Rehab Discharge Instructions  DINH SAWICKI Discharge date and time:  08/28/13   Activities/Precautions/ Functional Status: Activity: activity as tolerated Diet: cardiac diet Wound Care: none needed  Functional status:  ___ No restrictions     ___ Walk up steps independently _X__ 24/7 supervision/assistance   ___ Walk up steps with assistance ___ Intermittent supervision/assistance  ___ Bathe/dress independently ___ Walk with walker     _X__ Bathe/dress with assistance ___ Walk Independently    ___ Shower independently ___ Walk with assistance    ___ Shower with assistance _X__ No alcohol     ___ Return to work/school ________  Special Instructions:   STROKE/TIA DISCHARGE INSTRUCTIONS SMOKING Cigarette smoking nearly doubles your risk of having a stroke & is the single most alterable risk factor  If you smoke or have smoked in the last 12 months, you are advised to quit smoking for your health.  Most of the excess cardiovascular risk related to smoking disappears within a year of stopping.  Ask you doctor about anti-smoking medications  [place] Quit Line: 1-800-QUIT NOW  Free Smoking Cessation Classes [phone]  CHOLESTEROL Know your levels; limit fat & cholesterol in your diet  Lipid Panel     Component Value Date/Time   CHOL 162 08/20/2013 0350   TRIG 47 08/20/2013 0350   HDL 53 08/20/2013 0350   CHOLHDL 3.1 08/20/2013 0350   VLDL 9 08/20/2013 0350   LDLCALC 100* 08/20/2013 0350      Many patients benefit from treatment even if their cholesterol is at goal.  Goal: Total Cholesterol (CHOL) less than 160  Goal:  Triglycerides (TRIG) less than 150  Goal:  HDL greater than 40  Goal:  LDL (LDLCALC) less than 100   BLOOD PRESSURE American Stroke Association blood pressure target is less that 120/80 mm/Hg  Your discharge blood pressure is:  BP: 149/82 mmHg  Monitor your blood pressure  Limit your salt and alcohol intake  Many individuals will require more  than one medication for high blood pressure  DIABETES (A1c is a blood sugar average for last 3 months) Goal HGBA1c is under 7% (HBGA1c is blood sugar average for last 3 months)  Diabetes:     Lab Results  Component Value Date   HGBA1C 5.2 08/19/2013     Your HGBA1c can be lowered with medications, healthy diet, and exercise.  Check your blood sugar as directed by your physician  Call your physician if you experience unexplained or low blood sugars.  PHYSICAL ACTIVITY/REHABILITATION Goal is 30 minutes at least 4 days per week  Activity: No driving, Therapies: See above Return to work:  N/A  Activity decreases your risk of heart attack and stroke and makes your heart stronger.  It helps control your weight and blood pressure; helps you relax and can improve your mood.  Participate in a regular exercise program.  Talk with your doctor about the best form of exercise for you (dancing, walking, swimming, cycling).  DIET/WEIGHT Goal is to maintain a healthy weight  Your discharge diet is: Cardiac thin liquids Your height is:  5'8" Your current weight is: Weight: 69.2 kg (152 lb 8.9 oz) Your Body Mass Index (BMI) is:  23.2  Following the type of diet specifically designed for you will help prevent another stroke.  Your goal weight: 164 lbs  Your goal Body Mass Index (BMI) is 19-24.  Healthy food habits can help reduce 3 risk factors for stroke:  High cholesterol, hypertension, and excess weight.  RESOURCES Stroke/Support Group:  Call [phone]   STROKE EDUCATION PROVIDED/REVIEWED AND GIVEN TO PATIENT Stroke warning signs and symptoms How to activate emergency medical system (call 911). Medications prescribed at discharge. Need for follow-up after discharge. Personal risk factors for stroke. Pneumonia vaccine given:  Flu vaccine given:  My questions have been answered, the writing is legible, and I understand these instructions.  I will adhere to these goals & educational  materials that have been provided to me after my discharge from the hospital.      My questions have been answered and I understand these instructions. I will adhere to these goals and the provided educational materials after my discharge from the hospital.  Patient/Caregiver Signature _______________________________ Date __________  Clinician Signature _______________________________________ Date __________  Please bring this form and your medication list with you to all your follow-up doctor's appointments.

## 2013-08-27 ENCOUNTER — Ambulatory Visit (HOSPITAL_COMMUNITY): Admission: RE | Admit: 2013-08-27 | Payer: Medicare Other | Source: Ambulatory Visit | Admitting: Urology

## 2013-08-27 ENCOUNTER — Inpatient Hospital Stay (HOSPITAL_COMMUNITY): Payer: Medicare Other | Admitting: *Deleted

## 2013-08-27 ENCOUNTER — Encounter (HOSPITAL_COMMUNITY): Payer: Medicare Other | Admitting: Occupational Therapy

## 2013-08-27 ENCOUNTER — Inpatient Hospital Stay (HOSPITAL_COMMUNITY): Payer: Medicare Other | Admitting: Speech Pathology

## 2013-08-27 ENCOUNTER — Encounter (HOSPITAL_COMMUNITY): Admission: RE | Payer: Self-pay | Source: Ambulatory Visit

## 2013-08-27 SURGERY — CYSTOSCOPY, WITH RETROGRADE PYELOGRAM
Anesthesia: Choice

## 2013-08-27 NOTE — Plan of Care (Signed)
Problem: RH SAFETY Goal: RH STG ADHERE TO SAFETY PRECAUTIONS W/ASSISTANCE/DEVICE STG Adhere to Safety Precautions With Assistance/Device. Supervision  Outcome: Progressing Wife in room

## 2013-08-27 NOTE — Progress Notes (Signed)
Speech Language Pathology Daily Session Note  Patient Details  Name: Bryan Moore MRN: [id number] Date of Birth: 18-Mar-1920  Today's Date: 08/27/2013 Time: 0900-0940 Time Calculation (min): 40 min  Short Term Goals: Week 1: SLP Short Term Goal 1 (Week 1): Pt will utilize an increased vocal intensity at the phrase level with supervision verbal and question cues.  SLP Short Term Goal 2 (Week 1): Pt will utilize external memory aids to recall new, functioanl information with Mod A multimodal cueing  SLP Short Term Goal 3 (Week 1): Pt will utilize call bell to request assistance with Mod A question and visual cues.  SLP Short Term Goal 4 (Week 1): Pt will identify 1 cognitive deficit with Mod A question cues.  SLP Short Term Goal 5 (Week 1): Pt will demonstrate selective attention to basic and familiar tasks for 30 minutes with Min A cueing for redirection   Skilled Therapeutic Interventions: Skilled treatment session focus on cognitive goals and pt/family education. SLP facilitated session by providing Max A question, verbal and visual cues for utilization of schedule to recall/anticipate therapy sessions. SLP also facilitated session by providing education to the pt and his wife in regards to memory compensatory strategies to utilize at home to increase recall/carryover and overall safety. Pt's wife verbalized understanding of all information and handouts were also given to reinforce information.    FIM:  Comprehension Comprehension Mode: Auditory Comprehension: 3-Understands basic 50 - 74% of the time/requires cueing 25 - 50%  of the time Expression Expression Mode: Verbal Expression: 2-Expresses basic 25 - 49% of the time/requires cueing 50 - 75% of the time. Uses single words/gestures. Social Interaction Social Interaction: 2-Interacts appropriately 25 - 49% of time - Needs frequent redirection. Problem Solving Problem Solving: 2-Solves basic 25 - 49% of the time - needs direction  more than half the time to initiate, plan or complete simple activities Memory Memory: 2-Recognizes or recalls 25 - 49% of the time/requires cueing 51 - 75% of the time FIM - Eating Eating Activity: 5: Set-up assist for open containers  Pain Pain Assessment Pain Assessment: No/denies pain  Therapy/Group: Individual Therapy  Mehek Grega 08/27/2013, 1:12 PM

## 2013-08-27 NOTE — Progress Notes (Signed)
Physical Therapy Discharge Summary  Patient Details  Name: Bryan Moore MRN: [id number] Date of Birth: January 29, 1920  Today's Date: 08/27/2013; 08/28/2013 Time: 0810-0900 and [id number]; 1300-1330 Time Calculation (min): 50 min and 27 min; 30 min  Patient has met 10 of 10 long term goals due to improved activity tolerance, improved balance, improved postural control, increased strength and improved coordination.  Patient to discharge at a household ambulatory level Supervision.   Patient's wife  is independent to provide the necessary cognitive assistance at discharge. Patient's wife has observed most therapy sessions for education and performed hands-on training during last day of therapy.  Reasons goals not met: N/A, all LTGs   Recommendation:  Patient will benefit from ongoing skilled PT services in home health setting to continue to advance safe functional mobility, address ongoing impairments in strength, balance, postural control, coordination, attention, awareness, activity tolerance, gait, overall functional mobility, and minimize fall risk.  Equipment: No equipment provided  Reasons for discharge: treatment goals met and discharge from hospital  Patient/family agrees with progress made and goals achieved: Yes  Skilled Interventions: AM Session: Focus of today's sessions on family education/hands on training with patient and his wife with functional mobility; see details below. Patient wife return demonstrated safe guarding techniques as well as proper cues to provide patient during functional mobility. Emphasis on education session on decreasing verbal cues and keeping cues simple. Discussion/education/ reiteration to patient's wife about recommendations for 24/7 supervision, ambulation with RW not SPC, planning/strategies for assisting patient in house. Discussion that patient is never to be left alone in the home as patient's wife mentioned going to the grocery store.  PM Session:  Patient's wife performed all functional transfers and ambulation with patient providing close supervision. Patient requiring to use bathroom, wife able to assist patient with toilet transfer with use of RW with close supervision, wife manages brief, pants, and hygiene. Practiced additional furniture transfers to/from couches and patient's wife able to provide appropriate assistance and cues; recommendation made for patient's wife to use simple, one step commands.  08/28/13: Son present to observe patient and patient's wife perform all functional mobility: transfers, ambulation, and stairs. Patient's son very proactive in discharge planning. See FIM for details.  PT Discharge Precautions/Restrictions Precautions Precautions: Fall Precaution Comments: has dementia at baseline Restrictions Weight Bearing Restrictions: No Pain Pain Assessment Pain Assessment: No/denies pain Pain Score: 0-No pain Vision/Perception  Vision - History Baseline Vision: Wears glasses only for reading Visual History: Cataracts;Glaucoma Patient Visual Report: No change from baseline Perception Perception: Within Functional Limits Praxis Praxis: Intact  Cognition Overall Cognitive Status: Impaired/Different from baseline Arousal/Alertness: Awake/alert Orientation Level: Oriented to person;Oriented to place;Disoriented to time;Disoriented to situation Focused Attention: Appears intact Sustained Attention: Appears intact Selective Attention: Impaired Memory: Impaired Memory Impairment: Decreased recall of new information;Decreased short term memory Awareness: Impaired Awareness Impairment: Intellectual impairment Problem Solving: Impaired Safety/Judgment: Impaired Sensation Sensation Light Touch: Appears Intact Proprioception: Appears Intact Coordination Gross Motor Movements are Fluid and Coordinated: No Fine Motor Movements are Fluid and Coordinated: No Coordination and Movement Description: delayed  speed and accuracy Motor  Motor Motor: Abnormal postural alignment and control Motor - Discharge Observations: posterior trunk lean in standing  Mobility Bed Mobility Bed Mobility: Supine to Sit;Sitting - Scoot to Edge of Bed;Sit to Supine Supine to Sit: HOB flat;5: Supervision Supine to Sit Details: Tactile cues for initiation;Verbal cues for sequencing;Verbal cues for technique Sitting - Scoot to Edge of Bed: 5: Supervision Sitting - Scoot to [place] of Bed  Details: Verbal cues for sequencing;Verbal cues for technique Sit to Supine: 5: Supervision;HOB flat Sit to Supine - Details: Tactile cues for initiation;Verbal cues for technique;Verbal cues for sequencing Transfers Transfers: Yes Sit to Stand: From elevated surface;With upper extremity assist;5: Supervision;With armrests;From bed;From chair/3-in-1 Sit to Stand Details: Verbal cues for technique;Verbal cues for precautions/safety Stand to Sit: With upper extremity assist;With armrests;5: Supervision;To bed;To chair/3-in-1 Stand to Sit Details (indicate cue type and reason): Verbal cues for technique;Verbal cues for precautions/safety Stand Pivot Transfers: With armrests;5: Supervision Stand Pivot Transfer Details: Verbal cues for technique;Verbal cues for precautions/safety Locomotion  Ambulation Ambulation: Yes Ambulation/Gait Assistance: 5: Supervision Ambulation Distance (Feet): 160 Feet Assistive device: Rolling walker Ambulation/Gait Assistance Details: Verbal cues for precautions/safety;Verbal cues for gait pattern;Verbal cues for safe use of DME/AE Ambulation/Gait Assistance Details: 23' x2 in controlled environment and 30' x1 in ADL apartment on carpet to simulate home environment. Patient's wife provided close supervision and appropriate cues. Gait Gait: Yes Gait Pattern: Impaired Gait Pattern: Step-through pattern;Decreased stride length;Trunk flexed;Narrow base of support;Decreased weight shift to  right;Shuffle;Decreased trunk rotation Stairs / Additional Locomotion Stairs: Yes Stairs Assistance: 5: Supervision Stairs Assistance Details: Verbal cues for precautions/safety;Verbal cues for sequencing;Verbal cues for technique Stairs Assistance Details (indicate cue type and reason): R handrail ascending, L handrail descending Stair Management Technique: One rail Right;One rail Left;Step to pattern;Sideways Number of Stairs: 6 Height of Stairs: 6 Wheelchair Mobility Wheelchair Mobility: Yes Wheelchair Assistance: 5: Investment banker, operational Details: Verbal cues for sequencing;Verbal cues for technique;Verbal cues for Information systems manager: Both upper extremities Wheelchair Parts Management: Needs assistance Distance: 150  Trunk/Postural Assessment  Cervical Assessment Cervical Assessment: Within Functional Limits (forward head posture) Thoracic Assessment Thoracic Assessment: Within Functional Limits (kyphotic) Lumbar Assessment Lumbar Assessment: Within Functional Limits Postural Control Postural Control: Deficits on evaluation Righting Reactions: delayed Postural Limitations: posterior trunk lean in standing  Balance Balance Balance Assessed: Yes Static Sitting Balance Static Sitting - Balance Support: No upper extremity supported;Feet supported Static Sitting - Level of Assistance: 5: Stand by assistance Static Standing Balance Static Standing - Balance Support: During functional activity;Bilateral upper extremity supported Static Standing - Level of Assistance: 5: Stand by assistance Dynamic Standing Balance Dynamic Standing - Balance Support: Bilateral upper extremity supported;During functional activity Dynamic Standing - Level of Assistance: 5: Stand by assistance Extremity Assessment  RLE Assessment RLE Assessment: Exceptions to [facility] RLE Strength RLE Overall Strength: Deficits;Due to premorbid status RLE Overall Strength Comments:  Grossly 3/5 LLE Assessment LLE Assessment: Exceptions to [facility] LLE Strength LLE Overall Strength: Deficits;Due to premorbid status LLE Overall Strength Comments: Grossly 3/5  See FIM for current functional status  Jaydynn Wolford S Claryssa Sandner S. Johaan Ryser, PT, DPT 08/27/2013, 9:03 AM

## 2013-08-27 NOTE — Progress Notes (Signed)
Subjective/Complaints: Slept better last night. Tried to get out of bed. A 12 point review of systems has been performed and if not noted above is otherwise negative.   Objective: Vital Signs: Blood pressure 129/67, pulse 69, temperature 97.9 F (36.6 C), temperature source Oral, resp. rate 17, height 5\' 8"  (1.727 m), weight 69.2 kg (152 lb 8.9 oz), SpO2 94.00%. No results found.  Recent Labs  08/25/13 0633  WBC 4.7  HGB 11.9*  HCT 33.9*  PLT 154    Recent Labs  08/25/13 0633  NA 138  K 3.9  CL 101  GLUCOSE 83  BUN 22  CREATININE 1.14  CALCIUM 8.7   CBG (last 3)  No results found for this basename: GLUCAP,  in the last 72 hours  Wt Readings from Last 3 Encounters:  08/24/13 69.2 kg (152 lb 8.9 oz)  08/19/13 68.8 kg (151 lb 10.8 oz)  06/04/13 74.435 kg (164 lb 1.6 oz)    Physical Exam:  Constitutional:  NAD HENT:  Head: Normocephalic and atraumatic.  Right Ear: External ear normal.  Left Ear: External ear normal.  Eyes: Conjunctivae and EOM are normal. Pupils are equal, round, and reactive to light. Right eye exhibits no discharge. Left eye exhibits no discharge. No scleral icterus.  Neck: No JVD present. No tracheal deviation present. No thyromegaly present.  Cardiovascular: Normal rate and regular rhythm. Exam reveals no gallop and no friction rub.  Slight systolic murmur heard.  Respiratory: No respiratory distress. He has no wheezes. He has no rales. He exhibits no tenderness.  GI: He exhibits no distension and no mass. There is no tenderness. There is no rebound and no guarding.  Lymphadenopathy:  He has no cervical adenopathy.  Neurological:  Slurred speech persists, but fullly intelligible. Alert .  Minimal insight and awareness. Aware that he's in the hospital. Able to follow basic commands with redirection. Right pronator drift and decreased [place], mild limb ataxia RUE greater than RLE. Strength grossly 4/5 RUE, 4+ LUE, RLE 3+HF to 4/5 at ankle. LLE is  4 to 4+ throughout.   Psychiatric:  Flat.    Assessment/Plan: 1. Functional deficits secondary to left globus pallidus and medial basal ganglia infarcts which require 3+ hours per day of interdisciplinary therapy in a comprehensive inpatient rehab setting. Physiatrist is providing close team supervision and 24 hour management of active medical problems listed below. Physiatrist and rehab team continue to assess barriers to discharge/monitor patient progress toward functional and medical goals.  For dc tomorrow. Discussed prognosis/safety with wife today.   FIM: FIM - Bathing Bathing Steps Patient Completed: Chest;Right Arm;Left Arm;Abdomen;Front perineal area;Buttocks;Left lower leg (including foot);Right lower leg (including foot);Left upper leg;Right upper leg Bathing: 4: Steadying assist  FIM - Upper Body Dressing/Undressing Upper body dressing/undressing steps patient completed: Thread/unthread right sleeve of pullover shirt/dresss;Thread/unthread left sleeve of pullover shirt/dress;Put head through opening of pull over shirt/dress;Pull shirt over trunk Upper body dressing/undressing: 5: Set-up assist to: Obtain clothing/put away FIM - Lower Body Dressing/Undressing Lower body dressing/undressing steps patient completed: Thread/unthread right pants leg;Thread/unthread left pants leg;Pull pants up/down;Fasten/unfasten pants;Don/Doff right shoe;Don/Doff left shoe Lower body dressing/undressing: 4: Min-Patient completed 75 plus % of tasks  FIM - Toileting Toileting steps completed by patient: Adjust clothing prior to toileting;Adjust clothing after toileting Toileting: 3: Mod-Patient completed 2 of 3 steps  FIM - Toilet Transfers Toilet Transfers: 4-To toilet/BSC: Min A (steadying Pt. > 75%)  FIM - Bed/Chair Transfer Bed/Chair Transfer Assistive Devices: Adult nurse Transfer: 5: Bed >  Chair or W/C: Supervision (verbal cues/safety issues);5: Chair or W/C > Bed: Supervision  (verbal cues/safety issues)  FIM - Locomotion: Wheelchair Distance: 125 Locomotion: Wheelchair: 0: Activity did not occur FIM - Locomotion: Ambulation Locomotion: Ambulation Assistive Devices: Administrator Ambulation/Gait Assistance: 5: Supervision Locomotion: Ambulation: 5: Travels 150 ft or more with supervision/safety issues  Comprehension Comprehension Mode: Auditory Comprehension: 2-Understands basic 25 - 49% of the time/requires cueing 51 - 75% of the time  Expression Expression Mode: Verbal Expression: 2-Expresses basic 25 - 49% of the time/requires cueing 50 - 75% of the time. Uses single words/gestures.  Social Interaction Social Interaction: 2-Interacts appropriately 25 - 49% of time - Needs frequent redirection.  Problem Solving Problem Solving: 2-Solves basic 25 - 49% of the time - needs direction more than half the time to initiate, plan or complete simple activities  Memory Memory: 2-Recognizes or recalls 25 - 49% of the time/requires cueing 51 - 75% of the time  Medical Problem List and Plan:  Left basal ganglia infarct  1. H/o GIB on ASA/ Bladder cancer with microhematuria/ DVT Prophylaxis/Anticoagulation: Mechanical: Antiembolism stockings, thigh (TED hose) Bilateral lower extremities  Sequential compression devices, below knee Bilateral lower extremities  2. Pain Management: N/A  3. Mood: Dementia at baseline. Should improve further once home 4. Neuropsych: This patient is not capable of making decisions on his own behalf.  5. Anemia: Multifactorial--continue iron supplement. hgb 11.9 6. FEN: push po fluids.   7. Dyslipidemia: continue statin.    LOS (Days) 3 A FACE TO FACE EVALUATION WAS PERFORMED  SWARTZ,ZACHARY T 08/27/2013 8:00 AM

## 2013-08-27 NOTE — Plan of Care (Signed)
Problem: RH BLADDER ELIMINATION Goal: RH STG MANAGE BLADDER WITH ASSISTANCE Manage bladder with minimal assistance.  Outcome: Progressing Using urinal at this time.

## 2013-08-27 NOTE — Plan of Care (Signed)
Problem: RH PAIN MANAGEMENT Goal: RH STG PAIN MANAGED AT OR BELOW PT'S PAIN GOAL Pain level 3 or less on a scale of 0-10.  Outcome: Progressing No c/o pain     

## 2013-08-27 NOTE — Progress Notes (Signed)
Occupational Therapy Session Note  Patient Details  Name: Bryan Moore MRN: [id number] Date of Birth: 1920/01/07  Today's Date: 08/27/2013 Time: 1030-1130 Time Calculation (min): 60 min  Short Term Goals: Week 1:  OT Short Term Goal 1 (Week 1): STG=LTG  Skilled Therapeutic Interventions/Progress Updates:    1:1 self care retraining with focus on family education with wife on toileting, functional ambulation with RW, toilet and shower stall transfer, safe RW use, how to provide effective verbal cues for success, environmental setup at home, safe use of DME, allowing pt to initiate and try to complete task with supervision, community reintegration tips and energy conservation etc.   Therapy Documentation Precautions:  Precautions Precautions: Fall Precaution Comments: has dementia at baseline Restrictions Weight Bearing Restrictions: No    Pain: Pain Assessment Pain Assessment: No/denies pain Pain Score: 0-No pain  See FIM for current functional status  Therapy/Group: Individual Therapy  Willeen Cass [facility] 08/27/2013, 11:53 AM

## 2013-08-28 ENCOUNTER — Inpatient Hospital Stay (HOSPITAL_COMMUNITY): Payer: Medicare Other | Admitting: Occupational Therapy

## 2013-08-28 ENCOUNTER — Inpatient Hospital Stay (HOSPITAL_COMMUNITY): Payer: Medicare Other | Admitting: *Deleted

## 2013-08-28 MED ORDER — ATORVASTATIN CALCIUM 10 MG PO TABS: 10.0000 mg | ORAL_TABLET | Freq: Every day | ORAL | Status: DC

## 2013-08-28 MED ORDER — AMLODIPINE BESYLATE 5 MG PO TABS: 5.0000 mg | ORAL_TABLET | Freq: Every day | ORAL | Status: DC

## 2013-08-28 NOTE — Progress Notes (Signed)
Pt discharged to home, accompanied by his wife and son.

## 2013-08-28 NOTE — Discharge Summary (Signed)
Physician Discharge Summary  Patient ID: JAAFAR TRIPP MRN: [id number] DOB/AGE: Dec 23, 1919 80 y.o.  Admit date: 08/24/2013 Discharge date: 08/28/2013  Discharge Diagnoses:  Active Problems:   Hypertension   Anemia   CVA (cerebral infarction)   Discharged Condition: Stable  Significant Diagnostic Studies: NA   Labs:  Basic Metabolic Panel:  Recent Labs Lab 08/25/13 0633  NA 138  K 3.9  CL 101  CO2 25  GLUCOSE 83  BUN 22  CREATININE 1.14  CALCIUM 8.7    CBC:  Recent Labs Lab 08/25/13 0633  WBC 4.7  NEUTROABS 3.6  HGB 11.9*  HCT 33.9*  MCV 92.1  PLT 154    CBG: No results found for this basename: GLUCAP,  in the last 168 hours  Brief HPI:   Bryan Moore is a 80 y.o. male with h/o AVM, bladder cancer (pending surgery for TUR recurrent tumor), gait disorder as well as underlying dementia who was admitted 08/18/13 with 2-3 days of worsening confusion. Marland Kitchen His wife noted increased weakness on the right side since 08/15/13 as well as increased urinary frequency. He was started on IV cipro for bacturia and Neurology was consulted for input. MRI brain done revealing subacute nonhemorrhagic infarct involving the left globus pallidus and medial basal ganglia. 2D echo with EF 45-50% with diffuse hypokinesis, mild MVS, s/p TAVR without stenosis and grade 1 dysfunction. No antithrombotics as patient with h/o GIB on ASA.  Mentation slowly improving but patient continues with balance deficits. CIR was recommended by rehab team.   Hospital Course: Bryan Moore was admitted to rehab 08/24/2013 for inpatient therapies to consist of PT, ST and OT at least three hours five days a week. Past admission physiatrist, therapy team and rehab RN have worked together to provide customized collaborative inpatient rehab. He has had intermittent episodes of disorientation and episodes at nights. He was maintained on iron supplement and H/H has been stable. Blood pressures have been stable. He  was noted to be dehydrated at admission and po fluids were encouraged with improvement in BUN. He has made good progress and is at supervision level overall. He will continue to receive home therapies past discharge.    Rehab course: During patient's stay in rehab weekly team conferences were held to monitor patient's progress, set goals and discuss barriers to discharge. Patient has had improvement in activity tolerance, balance, postural control, as well as ability to compensate for deficits.  He requires supervision for ADL as well as all mobility. His vocal intensity has improved with improvement in speech intelligibility. He continues to require Min-Mod A for selective attention, intellectual awareness and functional problem solving. He continues to require Max-Total A cueing for utilization of external memory aids to recall daily information. Family education was done with wife as well as son.    Disposition: 01-Home or Self Care  Diet: Cardiac diet.   Special Instructions: 1. Needs 24 hours supervision.       Future Appointments Provider Department Dept Phone   10/01/2013 3:00 PM Flora Lipps [facility] Physical Medicine and Rehabilitation [phone]   10/07/2013 2:00 PM Thayer Headings, MD [facility] [phone]       Medication List         acetaminophen 325 MG tablet  Commonly known as:  TYLENOL  Take 650 mg by mouth every 6 (six) hours as needed for moderate pain.     amLODipine 5 MG tablet  Commonly known as:  NORVASC  Take 1 tablet (5 mg total) by mouth daily.     atorvastatin 10 MG tablet  Commonly known as:  LIPITOR  Take 1 tablet (10 mg total) by mouth daily at 6 PM.     brimonidine 0.1 % Soln  Commonly known as:  ALPHAGAN P  Place 1 drop into both eyes 2 (two) times daily.     cholecalciferol 1000 UNITS tablet  Commonly known as:  VITAMIN D  Take 1,000 Units by mouth every morning.     dorzolamide-timolol 22.3-6.8 MG/ML  ophthalmic solution  Commonly known as:  COSOPT  Place 1 drop into both eyes every 12 (twelve) hours.     iron polysaccharides 150 MG capsule  Commonly known as:  NIFEREX  Take 150 mg by mouth every morning.     latanoprost 0.005 % ophthalmic solution  Commonly known as:  XALATAN  Place 1 drop into both eyes at bedtime.     multivitamin with minerals Tabs tablet  Take 1 tablet by mouth every morning.     vitamin B-12 250 MCG tablet  Commonly known as:  CYANOCOBALAMIN  Take 250 mcg by mouth every morning.       Follow-up Information   Follow up with Bary Leriche, PA-C On 10/01/2013. (Be there at 2:30 for 3 pm  appointment)    Specialty:  Physical Medicine and Rehabilitation   Contact information:   [place] [address] [phone]       Follow up with Forbes Cellar, MD. Call today. (for appointment in 6 weeks)    Specialties:  Neurology, Radiology   Contact information:   [address] [place] [address] [phone]       Follow up with [name], MD On 08/31/2013. (@ 11:00 am)    Specialty:  Endocrinology   Contact information:   [place] [address] [phone]       Signed: Bary Leriche 08/31/2013, 7:31 PM

## 2013-08-28 NOTE — Progress Notes (Signed)
Speech Language Pathology Discharge Summary  Patient Details  Name: Bryan Moore MRN: [id number] Date of Birth: 1920-06-05  Today's Date: 08/28/2013  Patient has met 4 of 5 long term goals.  Patient to discharge at overall Mod;Min level.   Reasons goals not met: Pt continues to reqiure Max A for utilization of memory compensatory strategies. Pt also had a short length of stay.    Clinical Impression/Discharge Summary: Pt has made functional gains and has met 4 of 5 LTG's this admission. Currently, pt requires overall Min-Mod A for selective attention, intellectual awareness and functional problem solving and continues to require Max-Total A cueing for utilization of external memory aids to recall daily information. Pt's vocal intensity has improved which has increased his overall speech intelligibility; however, pt continues to require intermittent supervision cues to for increased vocal intensity at the phrase level.  Pt/family education complete and pt will discharge home with 24 hour supervision from family.  Pt would benefit from f/u home health SLP services to maximize cognitive function and overall functional independence.   Care Partner:  Caregiver Able to Provide Assistance: Yes  Type of Caregiver Assistance: Physical;Cognitive  Recommendation:  24 hour supervision/assistance;Home Health SLP  Rationale for SLP Follow Up: Maximize cognitive function and independence;Reduce caregiver burden   Equipment: N/A   Reasons for discharge: Discharged from hospital   Patient/Family Agrees with Progress Made and Goals Achieved: Yes   See FIM for current functional status  Matteus Mcnelly 08/28/2013, 7:28 AM

## 2013-08-28 NOTE — Progress Notes (Signed)
Occupational Therapy Discharge Summary  Patient Details  Name: Bryan Moore MRN: [id number] Date of Birth: [birth year]  Today's Date: 08/28/2013 Time: 1330-1400 Time Calculation (min): 30 min  Review of Family education with pt's wife and pt's son on basic ADL, functional mobility, safe use of DME, review of 24 hr supervision, supervision of IADLs, promoting initiation with all tasks, community reentry with energy conservation techniques.   Patient has met 10 of 10 long term goals due to improved activity tolerance, improved balance, postural control, ability to compensate for deficits and improved coordination.  Patient to discharge at overall Supervision level.  Patient's care partner is independent to provide the necessary physical and cognitive assistance at discharge.    Reasons goals not met: n/a  Recommendation:  Patient will benefit from ongoing skilled OT services in home health setting to continue to advance functional skills in the area of BADL and Reduce care partner burden.  Equipment: No equipment provided  Reasons for discharge: treatment goals met and discharge from hospital  Patient/family agrees with progress made and goals achieved: Yes  OT Discharge    Cognition Overall Cognitive Status: Impaired/Different from baseline Arousal/Alertness: Awake/alert Orientation Level: Oriented to person;Oriented to place;Oriented to situation Attention: Selective Focused Attention: Appears intact Sustained Attention: Appears intact Selective Attention: Impaired Selective Attention Impairment: Verbal basic;Functional basic Memory: Impaired Memory Impairment: Decreased recall of new information;Decreased short term memory Awareness: Impaired Awareness Impairment: Intellectual impairment Problem Solving: Impaired Problem Solving Impairment: Verbal basic;Functional basic Safety/Judgment: Impaired Precautions/Restrictions  Precautions  Precautions: Fall  Precaution  Comments: has dementia at baseline  Restrictions  Weight Bearing Restrictions: No  Pain  Pain Assessment  Pain Assessment: No/denies pain  Pain Score: 0-No pain  Vision/Perception  Vision - History  Baseline Vision: Wears glasses only for reading  Visual History: Cataracts;Glaucoma  Patient Visual Report: No change from baseline  Perception  Perception: Within Functional Limits  Praxis  Praxis: Intact   Sensation  Sensation  Light Touch: Appears Intact  Proprioception: Appears Intact  Coordination  Gross Motor Movements are Fluid and Coordinated: No  Fine Motor Movements are Fluid and Coordinated: No  Coordination and Movement Description: delayed speed and accuracy  Motor  Motor  Motor: Abnormal postural alignment and control  Motor - Discharge Observations: posterior trunk lean in standing  Mobility  Bed Mobility  Bed Mobility: Supine to Sit;Sitting - Scoot to [place] of Bed;Sit to Supine  Supine to Sit: HOB flat;5: Supervision  Supine to Sit Details: Tactile cues for initiation;Verbal cues for sequencing;Verbal cues for technique  Sitting - Scoot to Edge of Bed: 5: Supervision  Sitting - Scoot to [place] of Bed Details: Verbal cues for sequencing;Verbal cues for technique  Sit to Supine: 5: Supervision;HOB flat  Sit to Supine - Details: Tactile cues for initiation;Verbal cues for technique;Verbal cues for sequencing  Transfers  Transfers: Yes  Sit to Stand: From elevated surface;With upper extremity assist;5: Supervision;With armrests;From bed;From chair/3-in-1  Sit to Stand Details: Verbal cues for technique;Verbal cues for precautions/safety  Stand to Sit: With upper extremity assist;With armrests;5: Supervision;To bed;To chair/3-in-1  Stand to Sit Details (indicate cue type and reason): Verbal cues for technique;Verbal cues for precautions/safety  Stand Pivot Transfers: With armrests;5: Supervision  Stand Pivot Transfer Details: Verbal cues for technique;Verbal cues for  precautions/safety   Trunk/Postural Assessment  Cervical Assessment  Cervical Assessment: Within Functional Limits (forward head posture)  Thoracic Assessment  Thoracic Assessment: Within Functional Limits (kyphotic)  Lumbar Assessment  Lumbar Assessment: Within Functional Limits  Postural Control  Postural Control: Deficits on evaluation  Righting Reactions: delayed  Postural Limitations: posterior trunk lean in standing  Balance  Balance  Balance Assessed: Yes  Static Sitting Balance  Static Sitting - Balance Support: No upper extremity supported;Feet supported  Static Sitting - Level of Assistance: 5: Stand by assistance  Static Standing Balance  Static Standing - Balance Support: During functional activity;Bilateral upper extremity supported  Static Standing - Level of Assistance: 5: Stand by assistance  Dynamic Standing Balance  Dynamic Standing - Balance Support: Bilateral upper extremity supported;During functional activity  Dynamic Standing - Level of Assistance: 5: Stand by assistance  Extremity Assessment  Right UE - [facility] 4/5 Left UE [facility] 4/5 See FIM for current functional status       See FIM for current functional status  Willeen Cass [facility] 08/28/2013, 4:35 PM

## 2013-08-28 NOTE — Progress Notes (Signed)
Subjective/Complaints: No issues reported over night. Pt comfortable in bed this am A 12 point review of systems has been performed and if not noted above is otherwise negative.   Objective: Vital Signs: Blood pressure 105/57, pulse 72, temperature 98.1 F (36.7 C), temperature source Oral, resp. rate 18, height 5\' 8"  (1.727 m), weight 67.767 kg (149 lb 6.4 oz), SpO2 97.00%. No results found. No results found for this basename: WBC, HGB, HCT, PLT,  in the last 72 hours No results found for this basename: NA, K, CL, CO, GLUCOSE, BUN, CREATININE, CALCIUM,  in the last 72 hours CBG (last 3)  No results found for this basename: GLUCAP,  in the last 72 hours  Wt Readings from Last 3 Encounters:  08/26/13 67.767 kg (149 lb 6.4 oz)  08/19/13 68.8 kg (151 lb 10.8 oz)  06/04/13 74.435 kg (164 lb 1.6 oz)    Physical Exam:  Constitutional:  NAD HENT:  Head: Normocephalic and atraumatic.  Right Ear: External ear normal.  Left Ear: External ear normal.  Eyes: Conjunctivae and EOM are normal. Pupils are equal, round, and reactive to light. Right eye exhibits no discharge. Left eye exhibits no discharge. No scleral icterus.  Neck: No JVD present. No tracheal deviation present. No thyromegaly present.  Cardiovascular: Normal rate and regular rhythm. Exam reveals no gallop and no friction rub.  Slight systolic murmur heard.  Respiratory: No respiratory distress. He has no wheezes. He has no rales. He exhibits no tenderness.  GI: He exhibits no distension and no mass. There is no tenderness. There is no rebound and no guarding.  Lymphadenopathy:  He has no cervical adenopathy.  Neurological:  Slurred speech persists, but fullly intelligible. Alert .  Minimal insight and awareness. Aware that he's in the hospital. Able to follow basic commands with redirection. Right pronator drift and decreased Tarnov, mild limb ataxia RUE greater than RLE. Strength grossly 4/5 RUE, 4+ LUE, RLE 3+HF to 4/5 at  ankle. LLE is 4 to 4+ throughout.   Psychiatric:  Flat.    Assessment/Plan: 1. Functional deficits secondary to left globus pallidus and medial basal ganglia infarcts which require 3+ hours per day of interdisciplinary therapy in a comprehensive inpatient rehab setting. Physiatrist is providing close team supervision and 24 hour management of active medical problems listed below. Physiatrist and rehab team continue to assess barriers to discharge/monitor patient progress toward functional and medical goals.  Home today. I discussed with wife. He may see me after dc if needed. Follow up with Dr. Elyse Hsu   FIM: FIM - Bathing Bathing Steps Patient Completed: Chest;Right Arm;Left Arm;Abdomen;Front perineal area;Buttocks;Left lower leg (including foot);Right lower leg (including foot);Left upper leg;Right upper leg Bathing: 5: Supervision: Safety issues/verbal cues  FIM - Upper Body Dressing/Undressing Upper body dressing/undressing steps patient completed: Thread/unthread right sleeve of pullover shirt/dresss;Thread/unthread left sleeve of pullover shirt/dress;Put head through opening of pull over shirt/dress;Pull shirt over trunk Upper body dressing/undressing: 5: Set-up assist to: Obtain clothing/put away FIM - Lower Body Dressing/Undressing Lower body dressing/undressing steps patient completed: Thread/unthread right pants leg;Thread/unthread left pants leg;Pull pants up/down;Fasten/unfasten pants;Don/Doff right shoe;Don/Doff left shoe;Thread/unthread right underwear leg;Thread/unthread left underwear leg;Pull underwear up/down;Fasten/unfasten left shoe;Fasten/unfasten right shoe Lower body dressing/undressing: 5: Set-up assist to: Obtain clothing  FIM - Toileting Toileting steps completed by patient: Adjust clothing prior to toileting;Performs perineal hygiene;Adjust clothing after toileting Toileting: 5: Supervision: Safety issues/verbal cues  FIM - Toilet Transfers Toilet  Transfers: 5-Set-up assist to: Apply orthosis/W/C setup;5-From toilet/BSC: Supervision (verbal cues/safety issues)  FIM - Control and instrumentation engineer Devices: Environmental consultant;Arm rests Bed/Chair Transfer: 5: Chair or W/C > Bed: Supervision (verbal cues/safety issues);5: Bed > Chair or W/C: Supervision (verbal cues/safety issues)  FIM - Locomotion: Wheelchair Distance: 150 Locomotion: Wheelchair: 0: Activity did not occur FIM - Locomotion: Ambulation Locomotion: Ambulation Assistive Devices: Administrator Ambulation/Gait Assistance: 5: Supervision Locomotion: Ambulation: 5: Travels 150 ft or more with supervision/safety issues  Comprehension Comprehension Mode: Auditory Comprehension: 3-Understands basic 50 - 74% of the time/requires cueing 25 - 50%  of the time  Expression Expression Mode: Verbal Expression: 2-Expresses basic 25 - 49% of the time/requires cueing 50 - 75% of the time. Uses single words/gestures.  Social Interaction Social Interaction: 2-Interacts appropriately 25 - 49% of time - Needs frequent redirection.  Problem Solving Problem Solving: 2-Solves basic 25 - 49% of the time - needs direction more than half the time to initiate, plan or complete simple activities  Memory Memory: 2-Recognizes or recalls 25 - 49% of the time/requires cueing 51 - 75% of the time  Medical Problem List and Plan:  Left basal ganglia infarct  1. H/o GIB on ASA/ Bladder cancer with microhematuria/ DVT Prophylaxis/Anticoagulation: Mechanical: Antiembolism stockings, thigh (TED hose) Bilateral lower extremities  Sequential compression devices, below knee Bilateral lower extremities  2. Pain Management: N/A  3. Mood: Dementia at baseline. Should improve further once home 4. Neuropsych: This patient is not capable of making decisions on his own behalf.  5. Anemia: Multifactorial--continue iron supplement. hgb 11.9 6. FEN: push po fluids.at home   7. Dyslipidemia: continue  statin.    LOS (Days) 4 A FACE TO FACE EVALUATION WAS PERFORMED  Trinnity Breunig T 08/28/2013 7:39 AM

## 2013-08-31 DIAGNOSIS — F0390 Unspecified dementia without behavioral disturbance: Secondary | ICD-10-CM | POA: Diagnosis not present

## 2013-08-31 DIAGNOSIS — I69928 Other speech and language deficits following unspecified cerebrovascular disease: Secondary | ICD-10-CM | POA: Diagnosis not present

## 2013-08-31 DIAGNOSIS — Z5189 Encounter for other specified aftercare: Secondary | ICD-10-CM | POA: Diagnosis not present

## 2013-08-31 DIAGNOSIS — I69919 Unspecified symptoms and signs involving cognitive functions following unspecified cerebrovascular disease: Secondary | ICD-10-CM | POA: Diagnosis not present

## 2013-08-31 DIAGNOSIS — F0281 Dementia in other diseases classified elsewhere with behavioral disturbance: Secondary | ICD-10-CM | POA: Diagnosis not present

## 2013-08-31 DIAGNOSIS — I10 Essential (primary) hypertension: Secondary | ICD-10-CM | POA: Diagnosis not present

## 2013-08-31 DIAGNOSIS — D494 Neoplasm of unspecified behavior of bladder: Secondary | ICD-10-CM | POA: Diagnosis not present

## 2013-08-31 DIAGNOSIS — E7800 Pure hypercholesterolemia, unspecified: Secondary | ICD-10-CM | POA: Diagnosis not present

## 2013-08-31 DIAGNOSIS — I69959 Hemiplegia and hemiparesis following unspecified cerebrovascular disease affecting unspecified side: Secondary | ICD-10-CM | POA: Diagnosis not present

## 2013-08-31 DIAGNOSIS — R4182 Altered mental status, unspecified: Secondary | ICD-10-CM | POA: Diagnosis not present

## 2013-08-31 DIAGNOSIS — Z954 Presence of other heart-valve replacement: Secondary | ICD-10-CM | POA: Diagnosis not present

## 2013-08-31 DIAGNOSIS — D500 Iron deficiency anemia secondary to blood loss (chronic): Secondary | ICD-10-CM | POA: Diagnosis not present

## 2013-08-31 DIAGNOSIS — F02818 Dementia in other diseases classified elsewhere, unspecified severity, with other behavioral disturbance: Secondary | ICD-10-CM | POA: Diagnosis not present

## 2013-08-31 DIAGNOSIS — D630 Anemia in neoplastic disease: Secondary | ICD-10-CM | POA: Diagnosis not present

## 2013-09-01 ENCOUNTER — Other Ambulatory Visit: Payer: Self-pay | Admitting: Urology

## 2013-09-01 NOTE — Progress Notes (Signed)
Social Work  Discharge Note  The overall goal for the admission was met for:   Discharge location: Yes - home with wife to provide 24/7 assistance  Length of Stay: Yes - 4 days  Discharge activity level: Yes - supervision overall  Home/community participation: Yes  Services provided included: MD, RD, PT, OT, SLP, RN, TR, Pharmacy and SW  Financial Services: Medicare and Private Insurance: [place]  Follow-up services arranged: Home Health: PT, OT, ST via [place] and Patient/Family has no preference for HH/DME agencies  Comments (or additional information):  Patient/Family verbalized understanding of follow-up arrangements: Yes  Individual responsible for coordination of the follow-up plan: wife  Confirmed correct DME delivered: NA    Particia Strahm

## 2013-09-08 ENCOUNTER — Telehealth: Payer: Self-pay | Admitting: Neurology

## 2013-09-08 DIAGNOSIS — F0390 Unspecified dementia without behavioral disturbance: Secondary | ICD-10-CM | POA: Diagnosis not present

## 2013-09-08 DIAGNOSIS — D630 Anemia in neoplastic disease: Secondary | ICD-10-CM | POA: Diagnosis not present

## 2013-09-08 DIAGNOSIS — D494 Neoplasm of unspecified behavior of bladder: Secondary | ICD-10-CM

## 2013-09-08 DIAGNOSIS — Z5189 Encounter for other specified aftercare: Secondary | ICD-10-CM

## 2013-09-08 DIAGNOSIS — I69959 Hemiplegia and hemiparesis following unspecified cerebrovascular disease affecting unspecified side: Secondary | ICD-10-CM

## 2013-09-08 DIAGNOSIS — I69919 Unspecified symptoms and signs involving cognitive functions following unspecified cerebrovascular disease: Secondary | ICD-10-CM | POA: Diagnosis not present

## 2013-09-08 NOTE — Telephone Encounter (Signed)
I consulted Dr. [name].   [place] for surgery. (pt not on anticoagulants).

## 2013-09-08 NOTE — Telephone Encounter (Signed)
Pt saw Dr. [name] when he was in the hospital for a stroke.  He currently doesn't have a follow-up appointment scheduled.  His wife was calling because she would like to talk to Dr. [name] if possible.  Her husband is supposed to have bladder surgery to remove tumors on 09-17-13.  Dr. [name] is the surgeon.  She wants to know if Dr. [name] would approve of him having this surgery, if he is strong enough to have the surgery.  Please call

## 2013-09-08 NOTE — Telephone Encounter (Signed)
Okay to have bladder surgery as it is for cancer and he is not currently on any antiplatelet therapy

## 2013-09-09 NOTE — Telephone Encounter (Signed)
I called wife , no answer this am.  I called and LM for Pam in Dr. Delena Bali office about note from [place]. Sethi on [employer].  (ok for pt to have surgery).  [id number].

## 2013-09-14 ENCOUNTER — Encounter (HOSPITAL_COMMUNITY): Payer: Self-pay | Admitting: *Deleted

## 2013-09-16 NOTE — H&P (Signed)
ctive Problems Problems   1. Benign prostatic hypertrophy without lower urinary tract symptoms (600.00)  2. Bladder cancer (188.9)  3. 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 Erectile dysfunction due to arterial insufficiency (607.84)  5. Increased urinary frequency (788.41)  6. Malignant neoplasm of trigone of bladder (188.0)  7. Microscopic hematuria (599.72)  8. Nodular prostate without lower urinary tract symptoms (600.10)  9. Pyuria (791.9)  10. Testicular atrophy (608.3)  History of Present Illness      Bryan Moore returns today in f/u.  He has a history of T1 HG NMIBC that was Ta HG on a restaging TURBT in November.   He was to get BCG but it was unavailable at that time and then he wanted to get through the holidays prior to starting.  His left UO had been resected and stented initially but was widely patent at the time of restaging.  He has had no hematuria or voiding symptoms.   Past Medical History Problems   1. History of Elevated prostate specific antigen (PSA) (790.93)  2. History of glaucoma (V12.49)  3. History of herpes zoster (V12.09)  4. History of hypertension (V12.59)  5. History of Hydronephrosis, left (591)  6. History of Incomplete bladder emptying (788.21)  7. History of Urinary retention (788.20)  Surgical History Problems   1. History of Aortic Valve Replacement  2. History of Appendectomy  3. History of Back Surgery  4. History of Cystoscopy With Fulguration Large Lesion (Over 5cm)  5. History of Cystoscopy With Fulguration Medium Lesion (2-5cm)  6. History of Cystoscopy With Insertion Of Ureteral Stent Left  7. History of Cystoscopy With Ureteral Catheterization  Current Meds  1. Alphagan P 0.1 % Ophthalmic Solution;  Therapy: [id number] to Recorded  2. Brimonidine Tartrate 0.15 % Ophthalmic Solution;  Therapy: [id number] to Recorded  3. Dorzolamide HCl-Timolol Mal 22.3-6.8 MG/ML Ophthalmic Solution;  Therapy: [id number] to Recorded  Allergies Medication    1. Penicillins  Family History Problems   1. Family history of Family Health Status Number Of Children   1 son; 1 daughter  2. No pertinent family history : Sibling  Social History Problems   1. Alcohol Use   Occ glass of wine.  2. Caffeine Use   drinks coffee  3. Former smoker (V15.82)  4. Marital History - Currently Married  5. Occupation:   retired  45. Denied: Tobacco Use   Past and social history reviewed and updated.   Review of Systems  Genitourinary: no dysuria and no hematuria.  Gastrointestinal: no flank pain and no abdominal pain.  Cardiovascular: no chest pain.  Respiratory: no shortness of breath.  Musculoskeletal: joint pain (right shoulder after fall on Christmas day. ).    Physical Exam Constitutional: Well nourished and well developed . No acute distress.  Pulmonary: No respiratory distress and normal respiratory rhythm and effort.  Cardiovascular: Heart rate and rhythm are normal . No peripheral edema.    Results/Data Urine [Data Includes: Last 1 Day]   [id number]  COLOR YELLOW   APPEARANCE CLEAR   SPECIFIC GRAVITY 1.020   pH 5.5   GLUCOSE NEG mg/dL  BILIRUBIN NEG   KETONE NEG mg/dL  BLOOD MOD   PROTEIN NEG mg/dL  UROBILINOGEN 0.2 mg/dL  NITRITE NEG   LEUKOCYTE ESTERASE TRACE   SQUAMOUS EPITHELIAL/HPF NONE SEEN   WBC 7-10 WBC/hpf  RBC 7-10 RBC/hpf  BACTERIA RARE   CRYSTALS NONE SEEN   CASTS NONE SEEN    Procedure  Procedure: Cystoscopy  Indication: History of Urothelial Carcinoma.  Informed Consent: Risks, benefits, and potential adverse events were discussed and informed consent was obtained from the patient.  Prep: The patient was prepped with betadine.  Antibiotic prophylaxis: Ciprofloxacin.  Procedure Note:  Urethral meatus:. No abnormalities.  Anterior urethra: No abnormalities.  Prostatic urethra: No abnormalities . Estimated length was 3 cm. There was visual obstruction of the prostatic urethra. The lateral prostatic  lobes were enlarged.  Bladder: Visulization was clear. The ureteral orifices were in the normal anatomic position bilaterally and had clear efflux of urine. Examination of the bladder demonstrated mild trabeculation. Multiple tumors were identified in the bladder (with a 1cm tumor on the left posterior wall and areas on the base and along the edge of the prior resection that are consistent with CIS. ). The patient tolerated the procedure well.  Complications: None.    Assessment Assessed   1. Bladder cancer (188.9)  2. Pyuria (791.9)    He has recurrent tumors that are in areas that he didn't have them in November. He has some pyuria and microhematuria that are probably not from infection but a culture is indicated.   Plan  Bladder cancer   1. Follow-up Schedule Surgery Office  Follow-up  Status: Hold For - Appointment   Requested for: [id number] Health Maintenance   2. UA With REFLEX; [Do Not Release]; Status:Resulted - Requires Verification;   [id number] 09:29AM   Urine culture. I am going to get him set up for Cystoscopy with Bil RTG's,  TURBT with instillation of [place].   Risks were reviewed in detail. I will put him on the BCG list ahead of the procedure since it will be imperative that we get him treated since his recurrences have been rapid.   Discussion/Summary   CC: Dr. Legrand Como [name].

## 2013-09-17 ENCOUNTER — Encounter (HOSPITAL_COMMUNITY): Payer: Self-pay | Admitting: *Deleted

## 2013-09-17 ENCOUNTER — Encounter (HOSPITAL_COMMUNITY): Payer: Medicare Other | Admitting: Anesthesiology

## 2013-09-17 ENCOUNTER — Ambulatory Visit (HOSPITAL_COMMUNITY): Payer: Medicare Other | Admitting: Anesthesiology

## 2013-09-17 ENCOUNTER — Inpatient Hospital Stay (HOSPITAL_COMMUNITY)
Admission: RE | Admit: 2013-09-17 | Discharge: 2013-09-19 | DRG: 670 | Disposition: A | Discharge status: Home Health | Payer: Medicare Other | Source: Ambulatory Visit | Attending: Urology | Admitting: Urology

## 2013-09-17 ENCOUNTER — Encounter (HOSPITAL_COMMUNITY): Payer: Self-pay | Admitting: Pharmacy Technician

## 2013-09-17 ENCOUNTER — Encounter (HOSPITAL_COMMUNITY)
Admission: RE | Disposition: A | Discharge status: Home Health | Payer: Self-pay | Source: Ambulatory Visit | Attending: Urology

## 2013-09-17 DIAGNOSIS — K219 Gastro-esophageal reflux disease without esophagitis: Secondary | ICD-10-CM | POA: Diagnosis present

## 2013-09-17 DIAGNOSIS — E785 Hyperlipidemia, unspecified: Secondary | ICD-10-CM | POA: Diagnosis present

## 2013-09-17 DIAGNOSIS — Z954 Presence of other heart-valve replacement: Secondary | ICD-10-CM

## 2013-09-17 DIAGNOSIS — R5383 Other fatigue: Secondary | ICD-10-CM | POA: Diagnosis present

## 2013-09-17 DIAGNOSIS — I69998 Other sequelae following unspecified cerebrovascular disease: Secondary | ICD-10-CM | POA: Diagnosis not present

## 2013-09-17 DIAGNOSIS — F0390 Unspecified dementia without behavioral disturbance: Secondary | ICD-10-CM | POA: Diagnosis present

## 2013-09-17 DIAGNOSIS — I10 Essential (primary) hypertension: Secondary | ICD-10-CM | POA: Diagnosis present

## 2013-09-17 DIAGNOSIS — R5381 Other malaise: Secondary | ICD-10-CM | POA: Diagnosis present

## 2013-09-17 DIAGNOSIS — H409 Unspecified glaucoma: Secondary | ICD-10-CM | POA: Diagnosis present

## 2013-09-17 DIAGNOSIS — R310 Gross hematuria: Secondary | ICD-10-CM | POA: Diagnosis not present

## 2013-09-17 DIAGNOSIS — D090 Carcinoma in situ of bladder: Secondary | ICD-10-CM | POA: Diagnosis not present

## 2013-09-17 DIAGNOSIS — C679 Malignant neoplasm of bladder, unspecified: Secondary | ICD-10-CM | POA: Diagnosis not present

## 2013-09-17 DIAGNOSIS — Z87891 Personal history of nicotine dependence: Secondary | ICD-10-CM

## 2013-09-17 DIAGNOSIS — Z9181 History of falling: Secondary | ICD-10-CM | POA: Diagnosis not present

## 2013-09-17 DIAGNOSIS — I059 Rheumatic mitral valve disease, unspecified: Secondary | ICD-10-CM | POA: Diagnosis present

## 2013-09-17 HISTORY — PX: TRANSURETHRAL RESECTION OF BLADDER TUMOR: SHX2575

## 2013-09-17 HISTORY — PX: CYSTOSCOPY W/ RETROGRADES: SHX1426

## 2013-09-17 SURGERY — CYSTOSCOPY, WITH RETROGRADE PYELOGRAM
Anesthesia: General | Site: Ureter

## 2013-09-17 MED ORDER — MITOMYCIN CHEMO FOR BLADDER INSTILLATION 40 MG
40.0000 mg | Freq: Once | INTRAVENOUS | Status: AC
  Administered 2013-09-17: 40 mg via INTRAVESICAL
  Filled 2013-09-17: qty 40

## 2013-09-17 MED ORDER — FENTANYL CITRATE 0.05 MG/ML IJ SOLN
INTRAMUSCULAR | Status: DC | PRN
  Administered 2013-09-17 (×5): 25 ug via INTRAVENOUS

## 2013-09-17 MED ORDER — SODIUM CHLORIDE 0.9 % IJ SOLN
INTRAMUSCULAR | Status: AC
  Filled 2013-09-17: qty 10

## 2013-09-17 MED ORDER — HYDROCODONE-ACETAMINOPHEN 5-325 MG PO TABS
1.0000 | ORAL_TABLET | ORAL | Status: DC | PRN
  Administered 2013-09-17 – 2013-09-19 (×3): 1 via ORAL
  Filled 2013-09-17 (×3): qty 1

## 2013-09-17 MED ORDER — CIPROFLOXACIN IN D5W 200 MG/100ML IV SOLN
200.0000 mg | INTRAVENOUS | Status: AC
  Administered 2013-09-17: 200 mg via INTRAVENOUS
  Filled 2013-09-17: qty 100

## 2013-09-17 MED ORDER — ACETAMINOPHEN 325 MG PO TABS: 650.0000 mg | ORAL_TABLET | ORAL | Status: DC | PRN

## 2013-09-17 MED ORDER — AMLODIPINE BESYLATE 5 MG PO TABS
5.0000 mg | ORAL_TABLET | Freq: Every day | ORAL | Status: DC
  Administered 2013-09-18 – 2013-09-19 (×2): 5 mg via ORAL
  Filled 2013-09-17 (×2): qty 1

## 2013-09-17 MED ORDER — PROPOFOL 10 MG/ML IV BOLUS
INTRAVENOUS | Status: AC
  Filled 2013-09-17: qty 20

## 2013-09-17 MED ORDER — ATORVASTATIN CALCIUM 10 MG PO TABS
10.0000 mg | ORAL_TABLET | Freq: Every day | ORAL | Status: DC
  Administered 2013-09-17 – 2013-09-18 (×2): 10 mg via ORAL
  Filled 2013-09-17 (×3): qty 1

## 2013-09-17 MED ORDER — PHENYLEPHRINE 40 MCG/ML (10ML) SYRINGE FOR IV PUSH (FOR BLOOD PRESSURE SUPPORT)
PREFILLED_SYRINGE | INTRAVENOUS | Status: AC
  Filled 2013-09-17: qty 10

## 2013-09-17 MED ORDER — ONDANSETRON HCL 4 MG/2ML IJ SOLN
INTRAMUSCULAR | Status: AC
  Filled 2013-09-17: qty 2

## 2013-09-17 MED ORDER — CIPROFLOXACIN HCL 250 MG PO TABS
250.0000 mg | ORAL_TABLET | Freq: Two times a day (BID) | ORAL | Status: DC
Start: 2013-09-17 — End: 2013-09-19
  Administered 2013-09-17 – 2013-09-19 (×4): 250 mg via ORAL
  Filled 2013-09-17 (×6): qty 1

## 2013-09-17 MED ORDER — KCL IN DEXTROSE-NACL 20-5-0.45 MEQ/L-%-% IV SOLN
INTRAVENOUS | Status: DC
  Administered 2013-09-17 – 2013-09-19 (×4): via INTRAVENOUS
  Filled 2013-09-17 (×6): qty 1000

## 2013-09-17 MED ORDER — DEXAMETHASONE SODIUM PHOSPHATE 10 MG/ML IJ SOLN
INTRAMUSCULAR | Status: AC
  Filled 2013-09-17: qty 1

## 2013-09-17 MED ORDER — FENTANYL CITRATE 0.05 MG/ML IJ SOLN
INTRAMUSCULAR | Status: AC
  Filled 2013-09-17: qty 5

## 2013-09-17 MED ORDER — ONDANSETRON HCL 4 MG/2ML IJ SOLN
4.0000 mg | INTRAMUSCULAR | Status: DC | PRN
  Administered 2013-09-17: 4 mg via INTRAVENOUS
  Filled 2013-09-17: qty 2

## 2013-09-17 MED ORDER — LIDOCAINE HCL (CARDIAC) 20 MG/ML IV SOLN
INTRAVENOUS | Status: DC | PRN
  Administered 2013-09-17: 50 mg via INTRAVENOUS

## 2013-09-17 MED ORDER — IOHEXOL 300 MG/ML  SOLN
INTRAMUSCULAR | Status: DC | PRN
Start: 2013-09-17 — End: 2013-09-17
  Administered 2013-09-17: 25 mL

## 2013-09-17 MED ORDER — PHENYLEPHRINE HCL 10 MG/ML IJ SOLN
INTRAMUSCULAR | Status: DC | PRN
Start: 2013-09-17 — End: 2013-09-17
  Administered 2013-09-17: 40 ug via INTRAVENOUS
  Administered 2013-09-17: 80 ug via INTRAVENOUS

## 2013-09-17 MED ORDER — LIDOCAINE HCL (CARDIAC) 20 MG/ML IV SOLN
INTRAVENOUS | Status: AC
  Filled 2013-09-17: qty 5

## 2013-09-17 MED ORDER — EPHEDRINE SULFATE 50 MG/ML IJ SOLN
INTRAMUSCULAR | Status: AC
  Filled 2013-09-17: qty 1

## 2013-09-17 MED ORDER — PROPOFOL 10 MG/ML IV BOLUS
INTRAVENOUS | Status: DC | PRN
  Administered 2013-09-17: 110 mg via INTRAVENOUS

## 2013-09-17 MED ORDER — EPHEDRINE SULFATE 50 MG/ML IJ SOLN
INTRAMUSCULAR | Status: DC | PRN
  Administered 2013-09-17: 10 mg via INTRAVENOUS
  Administered 2013-09-17: 5 mg via INTRAVENOUS

## 2013-09-17 MED ORDER — TRAMADOL HCL 50 MG PO TABS: 50.0000 mg | ORAL_TABLET | Freq: Four times a day (QID) | ORAL | Status: DC | PRN

## 2013-09-17 MED ORDER — HYOSCYAMINE SULFATE 0.125 MG SL SUBL
0.1250 mg | SUBLINGUAL_TABLET | SUBLINGUAL | Status: DC | PRN
  Filled 2013-09-17: qty 1

## 2013-09-17 MED ORDER — DEXAMETHASONE SODIUM PHOSPHATE 10 MG/ML IJ SOLN
INTRAMUSCULAR | Status: DC | PRN
  Administered 2013-09-17: 10 mg via INTRAVENOUS

## 2013-09-17 MED ORDER — BISACODYL 10 MG RE SUPP: 10.0000 mg | Freq: Every day | RECTAL | Status: DC | PRN

## 2013-09-17 MED ORDER — LACTATED RINGERS IV SOLN
INTRAVENOUS | Status: DC
  Administered 2013-09-17: 1000 mL via INTRAVENOUS

## 2013-09-17 MED ORDER — FENTANYL CITRATE 0.05 MG/ML IJ SOLN: 25.0000 ug | INTRAMUSCULAR | Status: DC | PRN

## 2013-09-17 MED ORDER — LACTATED RINGERS IV SOLN
INTRAVENOUS | Status: DC
  Administered 2013-09-17: 11:00:00 via INTRAVENOUS

## 2013-09-17 MED ORDER — DIPHENHYDRAMINE HCL 50 MG/ML IJ SOLN
12.5000 mg | Freq: Four times a day (QID) | INTRAMUSCULAR | Status: DC | PRN
Start: 2013-09-17 — End: 2013-09-19

## 2013-09-17 MED ORDER — GLYCOPYRROLATE 0.2 MG/ML IJ SOLN
INTRAMUSCULAR | Status: DC | PRN
  Administered 2013-09-17: 0.2 mg via INTRAVENOUS

## 2013-09-17 MED ORDER — DIPHENHYDRAMINE HCL 12.5 MG/5ML PO ELIX: 12.5000 mg | ORAL_SOLUTION | Freq: Four times a day (QID) | ORAL | Status: DC | PRN

## 2013-09-17 MED ORDER — ADULT MULTIVITAMIN W/MINERALS CH
1.0000 | ORAL_TABLET | Freq: Every morning | ORAL | Status: DC
  Administered 2013-09-18 – 2013-09-19 (×2): 1 via ORAL
  Filled 2013-09-17 (×2): qty 1

## 2013-09-17 MED ORDER — DOCUSATE SODIUM 100 MG PO CAPS
100.0000 mg | ORAL_CAPSULE | Freq: Two times a day (BID) | ORAL | Status: DC
  Administered 2013-09-18 – 2013-09-19 (×2): 100 mg via ORAL
  Filled 2013-09-17 (×5): qty 1

## 2013-09-17 MED ORDER — BRIMONIDINE TARTRATE 0.15 % OP SOLN
1.0000 [drp] | Freq: Three times a day (TID) | OPHTHALMIC | Status: DC
  Administered 2013-09-17 – 2013-09-19 (×6): 1 [drp] via OPHTHALMIC
  Filled 2013-09-17: qty 5

## 2013-09-17 MED ORDER — HYDROMORPHONE HCL PF 1 MG/ML IJ SOLN: 0.5000 mg | INTRAMUSCULAR | Status: DC | PRN

## 2013-09-17 MED ORDER — DORZOLAMIDE HCL-TIMOLOL MAL 2-0.5 % OP SOLN
1.0000 [drp] | Freq: Two times a day (BID) | OPHTHALMIC | Status: DC
  Administered 2013-09-17 – 2013-09-19 (×4): 1 [drp] via OPHTHALMIC
  Filled 2013-09-17: qty 10

## 2013-09-17 MED ORDER — LATANOPROST 0.005 % OP SOLN
1.0000 [drp] | Freq: Every day | OPHTHALMIC | Status: DC
  Administered 2013-09-17 – 2013-09-18 (×2): 1 [drp] via OPHTHALMIC
  Filled 2013-09-17: qty 2.5

## 2013-09-17 MED ORDER — ESMOLOL HCL 10 MG/ML IV SOLN
INTRAVENOUS | Status: AC
Start: 2013-09-17 — End: 2013-09-17
  Filled 2013-09-17: qty 10

## 2013-09-17 SURGICAL SUPPLY — 26 items
BAG URINE DRAINAGE (UROLOGICAL SUPPLIES) ×2 IMPLANT
BAG URO CATCHER STRL LF (DRAPE) ×4 IMPLANT
CATH FOLEY 2WAY SLVR  5CC 20FR (CATHETERS) ×2
CATH FOLEY 2WAY SLVR 5CC 20FR (CATHETERS) IMPLANT
CATH FOLEY 3WAY 30CC 22FR (CATHETERS) IMPLANT
CATH URET 5FR 28IN OPEN ENDED (CATHETERS) ×2 IMPLANT
DRAPE CAMERA CLOSED 9X96 (DRAPES) ×4 IMPLANT
ELECT BUTTON HF 24-28F 2 30DE (ELECTRODE) ×2 IMPLANT
ELECT LOOP MED HF 24F 12D (CUTTING LOOP) ×4 IMPLANT
ELECT LOOP MED HF 24F 12D CBL (CLIP) ×2 IMPLANT
ELECT RESECT VAPORIZE 12D CBL (ELECTRODE) ×2 IMPLANT
GLOVE SURG SS PI 8.0 STRL IVOR (GLOVE) ×4 IMPLANT
GOWN STRL REUS W/TWL XL LVL3 (GOWN DISPOSABLE) ×6 IMPLANT
GUIDEWIRE STR DUAL SENSOR (WIRE) ×2 IMPLANT
HOLDER FOLEY CATH W/STRAP (MISCELLANEOUS) ×2 IMPLANT
IV NS 1000ML (IV SOLUTION) ×16
IV NS 1000ML BAXH (IV SOLUTION) IMPLANT
IV SOD CHL 0.9% 1000ML (IV SOLUTION) ×2 IMPLANT
KIT ASPIRATION TUBING (SET/KITS/TRAYS/PACK) ×4 IMPLANT
MANIFOLD NEPTUNE II (INSTRUMENTS) ×4 IMPLANT
PACK CYSTO (CUSTOM PROCEDURE TRAY) ×4 IMPLANT
SUT ETHILON 3 0 PS 1 (SUTURE) IMPLANT
SYR 30ML LL (SYRINGE) IMPLANT
SYRINGE IRR TOOMEY STRL 70CC (SYRINGE) IMPLANT
TUBING CONNECTING 10 (TUBING) ×3 IMPLANT
TUBING CONNECTING 10' (TUBING) ×1

## 2013-09-17 NOTE — Brief Op Note (Signed)
09/17/2013  10:27 AM  PATIENT:  Bryan Moore  80 y.o. male  PRE-OPERATIVE DIAGNOSIS:  BLADDER CANCER  POST-OPERATIVE DIAGNOSIS:  BLADDER CANCER  PROCEDURE:  Procedure(s): CYSTOSCOPY WITH BILATERAL RETROGRADE (Bilateral) TRANSURETHRAL RESECTION OF LARGE BLADDER TUMOR WITH MITOMYCIN-C (N/A) [place] INSTILLATION IN PACU  SURGEON:  Surgeon(s) and Role:    * [name], MD - Primary  PHYSICIAN ASSISTANT:   ASSISTANTS: none   ANESTHESIA:   general  EBL:  Total I/O In: 900 [I.V.:900] Out: -   BLOOD ADMINISTERED:none  DRAINS: Urinary Catheter (Foley)   LOCAL MEDICATIONS USED:  NONE  SPECIMEN:  Source of Specimen:  bladder tumor chips  DISPOSITION OF SPECIMEN:  PATHOLOGY  COUNTS:  YES  TOURNIQUET:  * No tourniquets in log *  DICTATION: .Other Dictation: Dictation Number [id number]  PLAN OF CARE: Admit for overnight observation  PATIENT DISPOSITION:  PACU - hemodynamically stable.   Delay start of Pharmacological VTE agent (>24hrs) due to surgical blood loss or risk of bleeding: yes

## 2013-09-17 NOTE — Progress Notes (Signed)
RN inflated balloon of foley with 7ccs NS after finding pt's bed was wet with bloody urine. RN hand irrigated foley, explaining to pt what RN was doing, until clots were completely removed. No distress noted in pt. Will continue to monitor pt closely. Carnella [place] I

## 2013-09-17 NOTE — Discharge Instructions (Signed)
Cystoscopy, Care After   Refer to this sheet in the next few weeks. These instructions provide you with information on caring for yourself after your procedure. Your caregiver may also give you more specific instructions. Your treatment has been planned according to current medical practices, but problems sometimes occur. Call your caregiver if you have any problems or questions after your procedure.   HOME CARE INSTRUCTIONS   Things you can do to ease any discomfort after your procedure include:   Drinking enough water and fluids to keep your urine clear or pale yellow.   Taking a warm bath to relieve any burning feelings.  SEEK IMMEDIATE MEDICAL CARE IF:   You have an increase in blood in your urine.   You notice blood clots in your urine.   You have difficulty passing urine.   You have the chills.   You have abdominal pain.   You have a fever or persistent symptoms for more than 2 3 days.   You have a fever and your symptoms suddenly get worse.  MAKE SURE YOU:   Understand these instructions.   Will watch your condition.   Will get help right away if you are not doing well or get worse.  Document Released: 02/09/2005 Document Revised: 03/25/2013 Document Reviewed: 01/14/2012   ExitCare Patient Information 2014 [employer].

## 2013-09-17 NOTE — Care Management Note (Addendum)
    Page 1 of 1   09/18/2013     3:26:04 PM   CARE MANAGEMENT NOTE 09/18/2013  Patient:  Bryan Moore, Bryan Moore   Account Number:  [id number]  Date Initiated:  09/17/2013  Documentation initiated by:  Dessa Phi  Subjective/Objective Assessment:   80 Y/O M ADMITTED W/BLADDER CA.     Action/Plan:   FROM HOME W/SPOUSE.HAS RW.HAS PCP,PHARMACY.   Anticipated DC Date:  09/19/2013   Anticipated DC Plan:  [place]  CM consult      Choice offered to / List presented to:             Status of service:  In process, will continue to follow Medicare Important Message given?   (If response is "NO", the following Medicare IM given date fields will be blank) Date Medicare IM given:   Date Additional Medicare IM given:    Discharge Disposition:    Per UR Regulation:  Reviewed for med. necessity/level of care/duration of stay  If discussed at [place] of Stay Meetings, dates discussed:    Comments:  09/18/13 Antavious Spanos RN,BSN NCM [phone] PT/OT CONS.WILL AWAIT RECOMMENDATIONS.PATIENT HAS CHOSEN [facility] IF HH NEEDED.TC DEBBIE [facility] REP AWARE OF REFERRAL.WOULD RECOMMEND HHRN-MEDS,DISEASE MGMNT.AWAIT FINAL HHC ORDERS.  09/17/13 Ciena Sampley RN,BSN NCM [phone] S/P CYSTOSCOPY,TURBT.

## 2013-09-17 NOTE — Preoperative (Signed)
Beta Blockers   Reason not to administer Beta Blockers:Not Applicable 

## 2013-09-17 NOTE — Interval H&P Note (Signed)
History and Physical Interval Note:      09/17/2013 8:43 AM  Bryan Moore  has presented today for surgery, with the diagnosis of BLADDER CANCER  The various methods of treatment have been discussed with the patient and family. After consideration of risks, benefits and other options for treatment, the patient has consented to  Procedure(s): CYSTOSCOPY WITH BILATERAL RETROGRADE (Bilateral) TRANSURETHRAL RESECTION OF BLADDER TUMOR WITH MITOMYCIN-C (N/A) as a surgical intervention .  The patient's history has been reviewed, patient examined, no change in status, stable for surgery.  I have reviewed the patient's chart and labs.  Questions were answered to the patient's satisfaction.     [place] Fedora J

## 2013-09-17 NOTE — Transfer of Care (Signed)
Immediate Anesthesia Transfer of Care Note  Patient: Bryan Moore  Procedure(s) Performed: Procedure(s) (LRB): CYSTOSCOPY WITH BILATERAL RETROGRADE (Bilateral) TRANSURETHRAL RESECTION OF BLADDER TUMOR WITH MITOMYCIN-C (N/A)  Patient Location: PACU  Anesthesia Type: General  Level of Consciousness: sedated, patient cooperative and responds to stimulation  Airway & Oxygen Therapy: Patient Spontanous Breathing and Patient connected to face mask oxgen  Post-op Assessment: Report given to PACU RN and Post -op Vital signs reviewed and stable  Post vital signs: Reviewed and stable  Complications: No apparent anesthesia complications

## 2013-09-17 NOTE — Anesthesia Postprocedure Evaluation (Signed)
  Anesthesia Post-op Note  Patient: Bryan Moore  Procedure(s) Performed: Procedure(s) (LRB): CYSTOSCOPY WITH BILATERAL RETROGRADE (Bilateral) TRANSURETHRAL RESECTION OF BLADDER TUMOR WITH MITOMYCIN-C (N/A)  Patient Location: PACU  Anesthesia Type: General  Level of Consciousness: awake and alert   Airway and Oxygen Therapy: Patient Spontanous Breathing  Post-op Pain: mild  Post-op Assessment: Post-op Vital signs reviewed, Patient's Cardiovascular Status Stable, Respiratory Function Stable, Patent Airway and No signs of Nausea or vomiting  Last Vitals:  Filed Vitals:   09/17/13 1130  BP: 141/63  Pulse: 60  Temp:   Resp: 15    Post-op Vital Signs: stable   Complications: No apparent anesthesia complications

## 2013-09-17 NOTE — Anesthesia Preprocedure Evaluation (Addendum)
Anesthesia Evaluation  Patient identified by MRN, date of birth, ID band Patient awake    Reviewed: Allergy & Precautions, H&P , NPO status , Patient's Chart, lab work & pertinent test results  Airway Mallampati: II TM Distance: >3 FB Neck ROM: full    Dental  (+) Missing, Dental Advisory Given Many missing lower front teeth.:   Pulmonary neg pulmonary ROS, former smoker,  breath sounds clear to auscultation  Pulmonary exam normal       Cardiovascular Exercise Tolerance: Good hypertension, Pt. on medications Rhythm:regular Rate:Normal  AVR.  Atypical CP.  LBBB   Neuro/Psych Dementia.  Memory, balance, and weakness problems from stroke ( one month ago ) and back surgery.  Glaucoma.  Encephalopathy history CVA, Residual Symptoms negative psych ROS   GI/Hepatic negative GI ROS, Neg liver ROS,   Endo/Other  negative endocrine ROS  Renal/GU negative Renal ROS  negative genitourinary   Musculoskeletal   Abdominal   Peds  Hematology negative hematology ROS (+)   Anesthesia Other Findings   Reproductive/Obstetrics negative OB ROS                          Anesthesia Physical Anesthesia Plan  ASA: III  Anesthesia Plan: General   Post-op Pain Management:    Induction: Intravenous  Airway Management Planned: LMA  Additional Equipment:   Intra-op Plan:   Post-operative Plan:   Informed Consent: I have reviewed the patients History and Physical, chart, labs and discussed the procedure including the risks, benefits and alternatives for the proposed anesthesia with the patient or authorized representative who has indicated his/her understanding and acceptance.   Dental Advisory Given  Plan Discussed with: CRNA and Surgeon  Anesthesia Plan Comments:         Anesthesia Quick Evaluation

## 2013-09-17 NOTE — Progress Notes (Signed)
Patient fell at home 09/15/13 no serious injury. A few abrasions on arms

## 2013-09-17 NOTE — Progress Notes (Signed)
Patient ID: Bryan Moore, male   DOB: 1919-10-02, 80 y.o.   MRN: [id number]   Bryan Moore is without complaints.   His urine is bloody but the foley is draining.   He did require irrigation earlier today.   I anticipate keeping him until Saturday for a voiding trial because of the extent of his tumor.

## 2013-09-18 ENCOUNTER — Encounter (HOSPITAL_COMMUNITY): Payer: Self-pay | Admitting: Urology

## 2013-09-18 DIAGNOSIS — K31819 Angiodysplasia of stomach and duodenum without bleeding: Secondary | ICD-10-CM | POA: Insufficient documentation

## 2013-09-18 NOTE — Progress Notes (Signed)
Patient ID: POLLUX EYE, male   DOB: 01/27/1920, 80 y.o.   MRN: [id number] 1 Day Post-Op  Subjective: Mr. Yanik is without complaints.  His urine is still moderately bloody with a small clot in the tube and he has required irrigation q2hrs but the foley is draining now.    ROS: Negative except as above  Objective: Vital signs in last 24 hours: Temp:  [97.2 F (36.2 C)-97.8 F (36.6 C)] 97.5 F (36.4 C) (02/13 0506) Pulse Rate:  [58-84] 74 (02/13 0506) Resp:  [8-29] 16 (02/13 0506) BP: (116-164)/(51-80) 116/51 mmHg (02/13 0506) SpO2:  [96 %-100 %] 97 % (02/13 0506) Weight:  [66.225 kg (146 lb)] 66.225 kg (146 lb) (02/12 0728)  Intake/Output from previous day: 02/12 0701 - 02/13 0700 In: 2240 [I.V.:1800] Out: 1411 [Urine:1410; Stool:1] Intake/Output this shift:    General appearance: alert and no distress foley is draining mild-moderately bloody urine but there doesn't appear to be active bleeding.   Lab Results:  No results found for this basename: WBC, HGB, HCT, PLT,  in the last 72 hours BMET No results found for this basename: NA, K, CL, CO2, GLUCOSE, BUN, CREATININE, CALCIUM,  in the last 72 hours PT/INR No results found for this basename: LABPROT, INR,  in the last 72 hours ABG No results found for this basename: PHART, PCO2, PO2, HCO3,  in the last 72 hours  Studies/Results: No results found.  Anti-infectives: Anti-infectives   Start     Dose/Rate Route Frequency Ordered Stop   09/17/13 2200  ciprofloxacin (CIPRO) tablet 250 mg     250 mg Oral 2 times daily 09/17/13 1327     09/17/13 0703  ciprofloxacin (CIPRO) IVPB 200 mg     200 mg 100 mL/hr over 60 Minutes Intravenous 60 min pre-op 09/17/13 0703 09/17/13 1002      Current Facility-Administered Medications  Medication Dose Route Frequency Provider Last Rate Last Dose  . acetaminophen (TYLENOL) tablet 650 mg  650 mg Oral Q4H PRN [name], MD      . amLODipine (NORVASC) tablet 5 mg  5 mg Oral Daily  [name], MD      . atorvastatin (LIPITOR) tablet 10 mg  10 mg Oral q1800 [name], MD   10 mg at 09/17/13 1732  . bisacodyl (DULCOLAX) suppository 10 mg  10 mg Rectal Daily PRN [name], MD      . brimonidine (ALPHAGAN) 0.15 % ophthalmic solution 1 drop  1 drop Both Eyes TID [name], MD   1 drop at 09/17/13 2130  . ciprofloxacin (CIPRO) tablet 250 mg  250 mg Oral BID [name], MD   250 mg at 09/17/13 2135  . dextrose 5 % and 0.45 % NaCl with KCl 20 mEq/L infusion   Intravenous Continuous [name], MD 75 mL/hr at 09/18/13 0250    . diphenhydrAMINE (BENADRYL) injection 12.5 mg  12.5 mg Intravenous Q6H PRN [name], MD       Or  . diphenhydrAMINE (BENADRYL) 12.5 MG/5ML elixir 12.5 mg  12.5 mg Oral Q6H PRN [name], MD      . docusate sodium (COLACE) capsule 100 mg  100 mg Oral BID [name], MD      . dorzolamide-timolol (COSOPT) 22.3-6.8 MG/ML ophthalmic solution 1 drop  1 drop Both Eyes BID [name], MD   1 drop at 09/17/13 2131  . HYDROcodone-acetaminophen (NORCO/VICODIN) 5-325 MG per tablet 1-2 tablet  1-2 tablet Oral Q4H PRN [name], MD  1 tablet at 09/17/13 2134  . HYDROmorphone (DILAUDID) injection 0.5-1 mg  0.5-1 mg Intravenous Q2H PRN [name], MD      . hyoscyamine (LEVSIN SL) SL tablet 0.125 mg  0.125 mg Oral Q4H PRN [name], MD      . latanoprost (XALATAN) 0.005 % ophthalmic solution 1 drop  1 drop Both Eyes QHS [name], MD   1 drop at 09/17/13 2131  . multivitamin with minerals tablet 1 tablet  1 tablet Oral q morning - 10a [name], MD      . ondansetron [facility]) injection 4 mg  4 mg Intravenous Q4H PRN [name], MD   4 mg at 09/17/13 2031    Assessment: s/p Procedure(s): CYSTOSCOPY WITH BILATERAL RETROGRADE TRANSURETHRAL RESECTION OF BLADDER TUMOR WITH MITOMYCIN-C  He is doing well apart from bloody urine.  Plan: Continue foley drainage with prn irrigation and consider foley removal in the am.     LOS: 1 day    Khaza Blansett  J 09/18/2013

## 2013-09-18 NOTE — Op Note (Signed)
NAME:  Bryan Moore, Bryan Moore NO.:  [id number]  MEDICAL RECORD NO.:  [id number]  LOCATION:  69                         FACILITY:  [facility]  PHYSICIAN:  Marshall Cork. [name], M.D.    DATE OF BIRTH:  10-13-1919  DATE OF PROCEDURE:  09/17/2013 DATE OF DISCHARGE:                              OPERATIVE REPORT   PROCEDURE: 1. Cystoscopy with bilateral retrograde pyelograms and interpretation. 2. Transurethral resection of large bladder tumor. 3. Instillation of mitomycin C in PACU.  PREOPERATIVE DIAGNOSIS:  Bladder cancer.  POSTOPERATIVE DIAGNOSIS:  Bladder cancer.  SURGEON:  Marshall Cork. [name], M.D.  ANESTHESIA:  General.  SPECIMEN:  Bladder tumor chips.  DRAINS:  A 20-French Foley catheter.  BLOOD LOSS:  Minimal.  COMPLICATIONS:  None.  INDICATIONS:  Mr. Bryan Moore is a 80 year old white male who was initially found to have bladder tumors back in the fall.  He underwent resection and was to undergo mitomycin C.  This was initially delayed through the holidays at the patient's request and he suffered a stroke which caused further delay.  When he returned for followup sufficient interval had passed so surveillance cystoscopy was performed in anticipation of BCG and the patient was noted to have multifocal recurrent tumors.  He returns now for re-resection and installation of mitomycin C.  FINDINGS OF PROCEDURE:  He was taken to the operating room, where general anesthetic was induced.  He was given 200 mg of Cipro IV.  He was placed in lithotomy position and fitted with PS hose.  His perineum and genitalia were prepped with Betadine solution.  He was draped in usual sterile fashion.  Cystoscopy was performed using the 22-French scope and the 12 to 70 degree lens.  Examination revealed a normal urethra.  The external sphincter was intact.  The prostatic urethra was approximately 3 cm in length with bilobar hyperplasia with some evidence of prior resection and minimal  obstruction.  Examination of the bladder demonstrated mild trabeculation on the left anterolateral wall.  There was approximately a 1.5 cm tumor with surrounding halo of erythematous tissue extended out on to the bladder neck consistent with carcinoma in situ.  There were additional papillary tumors at the left bladder neck lateral to the orifice.  This actually wrapped around anteriorly to approximately 12 o'clock.  There were additional papillary fronds on the posterior trigone on the right extending out on to the lateral wall and then up to the right bladder neck.  The left ureteral orifice was identified.  It had been previously resected and was somewhat wide mouth.  The right ureteral orifice had also been over resected and was somewhat attenuated and located laterally.  Once a thorough inspection had been performed, a 5-French opening catheter was passed in the left ureteral orifice and contrast was instilled.  This revealed a normal ureter and intrarenal collecting system.  The contrast effluxed rapidly from the wide-mouth orifice.  The right ureteral orifice was then cannulated, but required the aid of a guidewire because of the narrowing from the prior resection.  However, contrast was instilled apart from some air bubbles which eventually flushed.  There were no abnormalities in the ureter and intrarenal collecting system.  Once retrograde pyelogram had been completed, the cystoscope was removed and the 28-French continuous resectoscope sheath was placed.  This was fitted with an [place] handle and with a 12-degree lens and bipolar loop.  Saline was used as the irrigant.  Initially, the tumors of the left bladder neck and the left anterolateral wall were resected.  With complete resection, the 2 areas were contiguous and greater than 5 cm in diameter.  Once these areas were resected, additional resection was performed from the right posterior trigone and right bladder neck.   The surrounding areas of abnormal mucosa were then generously fulgurated to try to destroy any abnormal mucosa.  An additional smaller area of erythematous mucosa on the posterior wall was fulgurated.  The area on the right side that required resection and fulguration also measured approximately 4 cm x 5 cm.  Once all visible areas of tumor had been resected and the chips were retrieved, final inspection revealed no active bleeding.  No bladder wall perforation.  The resectoscope was then removed and a 20-French Foley catheter was inserted.  The balloon was filled with 10 mL sterile fluid and the catheter was placed to straight drainage.  The patient was taken down from a lithotomy position.  His anesthetic was reversed.  He was moved to recovery room in stable condition.  When he arrived in the PACU, mitomycin C was obtained from the pharmacy and was instilled per urethra 40 mg in 40 mL of diluent.  This was left indwelling for 40 minutes, and then the Foley was placed to straight drainage.  There were no complications.     Marshall Cork. [name], M.D.     JJW/MEDQ  D:  09/17/2013  T:  09/18/2013  Job:  [id number]

## 2013-09-18 NOTE — Progress Notes (Signed)
UR completed. Patient changed to inpatient- requiring IVF @ 75cc/hr 

## 2013-09-19 NOTE — Progress Notes (Signed)
OT Cancellation Note  Patient Details Name: Bryan Moore MRN: [id number] DOB: 11-09-1919   Cancelled Treatment:    Reason Eval/Treat Not Completed: OT screened, no needs identified, will sign off  [facility], OTR/L  [id number] 09/19/2013 09/19/2013, 2:38 PM

## 2013-09-19 NOTE — Discharge Summary (Signed)
Physician Discharge Summary  Patient ID: Bryan Moore MRN: [id number] DOB/AGE: [birth year] 80 y.o.  Admit date: 09/17/2013 Discharge date: 09/19/2013  Admission Diagnoses:  Bladder cancer  Discharge Diagnoses:  Principal Problem:   Bladder cancer   Past Medical History  Diagnosis Date  . Anemia     from AVM's. Requiring transfusion  . Hyperlipemia   . Aortic stenosis     s/p TAVI July 2012  . Glaucoma   . Mitral regurgitation   . Shingles May 2013    right arm residual pain- last outbreak 1 1/2 yrs ago.  Marland Kitchen Hypertension     Not taking Lisinopril due to BP drops low.  . Transfusion history     last 2 yrs ago.  Marland Kitchen GERD (gastroesophageal reflux disease)     RARE - AND NO MEDS  . Cancer     BLADDER CANCER  . Arthritis   . Balance problem     WEAK ON RIGHT SIDE ( SCOLISOSIS AND HX OF A BACK SURGERY) - BALANCE PROBLEM - "FALLS BACKWARD" - FREQUENT FALLS.  USES CANE WHEN AMBULATING  . Complication of anesthesia     VERY DROWSY FOR HOURS AFTER SURGERY - AND DISORIENTATION  . Heart murmur     PT'S CARDIOLOGIST IS DR. Acie Fredrickson -LAST OFFICE VISIT 03/02/13 IN [facility]  . Dementia     more memory problems from stroke    Surgeries: Procedure(s): CYSTOSCOPY WITH BILATERAL RETROGRADE TRANSURETHRAL RESECTION OF BLADDER TUMOR WITH MITOMYCIN-C on 09/17/2013   Consultants (if any):    Discharged Condition: Improved  Hospital Course: Bryan Moore is an 80 y.o. male who was admitted 09/17/2013 with a diagnosis of Bladder cancer and went to the operating room on 09/17/2013 and underwent the above named procedures.  He had bloody urine with clots postop but the bleeding has abated and the urine is pink without clots in the tube today.   He has had some sundowning but is better this morning.   I will have his foley and IV removed and will discharge him home when voiding.   A PVR will be checked prior to discharge.   If he is unable to void, I will have the foley replaced and reconsider  discharge.     He was given perioperative antibiotics:  Anti-infectives   Start     Dose/Rate Route Frequency Ordered Stop   09/17/13 2200  ciprofloxacin (CIPRO) tablet 250 mg     250 mg Oral 2 times daily 09/17/13 1327     09/17/13 0703  ciprofloxacin (CIPRO) IVPB 200 mg     200 mg 100 mL/hr over 60 Minutes Intravenous 60 min pre-op 09/17/13 0703 09/17/13 1002    .  He was given sequential compression devices for DVT prophylaxis.  He benefited maximally from the hospital stay and there were no complications.    Recent vital signs:  Filed Vitals:   09/19/13 0522  BP: 137/55  Pulse: 63  Temp: 97.6 F (36.4 C)  Resp: 16    Recent laboratory studies:  Lab Results  Component Value Date   HGB 11.9* 08/25/2013   HGB 11.1* 08/20/2013   HGB 11.5* 08/19/2013   Lab Results  Component Value Date   WBC 4.7 08/25/2013   PLT 154 08/25/2013   Lab Results  Component Value Date   INR 1.07 04/03/2011   Lab Results  Component Value Date   NA 138 08/25/2013   K 3.9 08/25/2013   CL 101 08/25/2013   CO2 25  08/25/2013   BUN 22 08/25/2013   CREATININE 1.14 08/25/2013   GLUCOSE 83 08/25/2013    Discharge Medications:     Medication List         acetaminophen 325 MG tablet  Commonly known as:  TYLENOL  Take 650 mg by mouth every 6 (six) hours as needed for moderate pain.     amLODipine 5 MG tablet  Commonly known as:  NORVASC  Take 1 tablet (5 mg total) by mouth daily.     atorvastatin 10 MG tablet  Commonly known as:  LIPITOR  Take 1 tablet (10 mg total) by mouth daily at 6 PM.     brimonidine 0.1 % Soln  Commonly known as:  ALPHAGAN P  Place 1 drop into both eyes 2 (two) times daily.     cholecalciferol 1000 UNITS tablet  Commonly known as:  VITAMIN D  Take 1,000 Units by mouth every morning.     dorzolamide-timolol 22.3-6.8 MG/ML ophthalmic solution  Commonly known as:  COSOPT  Place 1 drop into both eyes every 12 (twelve) hours.     iron polysaccharides 150 MG capsule   Commonly known as:  NIFEREX  Take 150 mg by mouth every morning.     latanoprost 0.005 % ophthalmic solution  Commonly known as:  XALATAN  Place 1 drop into both eyes at bedtime.     multivitamin with minerals Tabs tablet  Take 1 tablet by mouth every morning.     traMADol 50 MG tablet  Commonly known as:  ULTRAM  Take 1 tablet (50 mg total) by mouth every 6 (six) hours as needed.     vitamin B-12 250 MCG tablet  Commonly known as:  CYANOCOBALAMIN  Take 250 mcg by mouth every morning.        Diagnostic Studies: No results found.  Disposition: 06-Home-Health Care Svc       Future Appointments Provider Department Dept Phone   10/01/2013 3:00 PM Flora Lipps [facility] [phone]   10/07/2013 2:00 PM Thayer Headings, MD [place] [facility] [phone]      Follow-up Information   Follow up with [name], MD On 09/30/2013. [phone])    Specialty:  Urology   Contact information:   [facility]  [place] [address] [phone]        Signed: [name] 09/19/2013, 9:08 AM

## 2013-09-19 NOTE — Evaluation (Addendum)
Physical Therapy Evaluation Patient Details Name: Bryan Moore MRN: [id number] DOB: May 01, 1920 Today's Date: 09/19/2013 Time: 1212-1228 PT Time Calculation (min): 16 min  PT Assessment / Plan / Recommendation History of Present Illness  Bryan Moore  has presented today for surgery, with the diagnosis of BLADDER CANCER  The various methods of treatment have been discussed with the patient and family.   Clinical Impression  Pt close to baseline for basic mobility; he had recently started amb  With RW, mod I at home, however,  have discussed going to close supervision/min-guard with wife for now until HHPT is able to come back and pt gets a little stronger; he has had surgery and has  not been OOB in 2days; we have also discussed up/down stairs and wife states her son is coming to assist and they have installed a second handrail to allow more stability; wife aware of safe techniques     PT Assessment  Patient needs continued PT services;All further PT needs can be met in the next venue of care    Follow Up Recommendations  Home health PT    Does the patient have the potential to tolerate intense rehabilitation      Barriers to Discharge        Equipment Recommendations  None recommended by PT    Recommendations for Other Services     Frequency      Precautions / Restrictions Precautions Precautions: Fall   Pertinent Vitals/Pain Denies pain      Mobility  Bed Mobility Overal bed mobility: Needs Assistance Bed Mobility: Supine to Sit Supine to sit: Min guard General bed mobility comments: cues for use of UEs and safe technique Transfers Overall transfer level: Needs assistance Equipment used: Rolling walker (2 wheeled) Transfers: Sit to/from Stand Sit to Stand: Min guard General transfer comment: transfers repeated for safety and instruction from bed and chair; cuesf or hand placement Ambulation/Gait Ambulation Distance (Feet): 200 Feet Assistive device: Rolling  walker (2 wheeled) Gait Pattern/deviations: Step-through pattern Gait velocity: 1.38f/sec Gait velocity interpretation: <1.8 ft/sec, indicative of risk for recurrent falls General Gait Details: occasional  minimal posterior LOB with min-guard to recover; cues for slowing steps and staying inside RW during turns    Exercises     PT Diagnosis:    PT Problem List:   PT Treatment Interventions:       PT Goals(Current goals can be found in the care plan section) Acute Rehab PT Goals PT Goal Formulation: No goals set, d/c therapy  Visit Information  Last PT Received On: 09/19/13 Assistance Needed: +1 History of Present Illness: Bryan Moore has presented today for surgery, with the diagnosis of BLADDER CANCER  The various methods of treatment have been discussed with the patient and family. After consideration of risks, benefits and other options for treatment, the patient has consented to  Procedure(s):       [place]:: Private residence Living Arrangements: Spouse/significant other Available Help at Discharge: Family;Available 24 hours/day Type of Home: House Home Access: Stairs to enter ECenterPoint Energyof Steps: 3 Entrance Stairs-Rails: Right;Left Home Layout: One level Home Equipment: Walker - 2 wheels;Cane - single point;Shower seat - built in  Lives With: Spouse Prior Function Level of Independence: Needs assistance Gait / Transfers Assistance Needed: amb with RW; recently began amb in house with RW mod I per wife/HHPT ADL's / Homemaking Assistance Needed: Wife performs all homemaking and has everything  set-up for pt to perform his ADLs.   Communication Communication: No difficulties    Cognition  Cognition Arousal/Alertness: Awake/alert Behavior During Therapy: Flat affect Overall Cognitive Status: Within Functional Limits for tasks assessed    Extremity/Trunk Assessment Lower Extremity  Assessment Lower Extremity Assessment: Overall WFL for tasks assessed   Balance Balance Overall balance assessment: History of Falls;Needs assistance Sitting balance-Leahy Scale: Good Sitting balance - Comments: able to tolerate min perterbations and wt shift, close supervision for safety and wife does report LOB to R and posterior at baseline Postural control: Right lateral lean;Posterior lean Standing balance-Leahy Scale: Poor High level balance activites: Backward walking;Direction changes;Turns  End of Session PT - End of Session Equipment Utilized During Treatment: Gait belt Activity Tolerance: Patient tolerated treatment well Patient left: in chair;with family/visitor present Nurse Communication: Mobility status  GP     [facility] 09/19/2013, 1:02 PM

## 2013-09-20 NOTE — Progress Notes (Signed)
09/20/2013 0955 Call received from [place] rep requesting orders for [facility]. Review dc summary and MD requested HH. NCM followed up with pt's wife, Danton Clap. States pt was active with [facility] prior to admission with speech therapy, RN and PT.  NCM spoke to oncall Dr. Julio Alm and resumption of care orders received. Jonnie Finner RN CCM Case Mgmt phone [phone]

## 2013-09-28 ENCOUNTER — Telehealth: Payer: Self-pay

## 2013-09-28 NOTE — Telephone Encounter (Signed)
Bryan Moore @ [facility] is requesting a verbal order to extend patient's skilled nursing. Is this okay?

## 2013-09-28 NOTE — Telephone Encounter (Signed)
Verbal given to Mickel Baas with [facility] to extend skilled nursing.

## 2013-09-28 NOTE — Telephone Encounter (Signed)
Yes

## 2013-09-30 DIAGNOSIS — D090 Carcinoma in situ of bladder: Secondary | ICD-10-CM | POA: Diagnosis not present

## 2013-10-01 ENCOUNTER — Encounter: Payer: Medicare Other | Admitting: Physical Medicine and Rehabilitation

## 2013-10-05 DIAGNOSIS — R82998 Other abnormal findings in urine: Secondary | ICD-10-CM | POA: Diagnosis not present

## 2013-10-05 DIAGNOSIS — D090 Carcinoma in situ of bladder: Secondary | ICD-10-CM | POA: Diagnosis not present

## 2013-10-07 ENCOUNTER — Encounter: Payer: Self-pay | Admitting: Cardiovascular Disease

## 2013-10-07 ENCOUNTER — Ambulatory Visit (INDEPENDENT_AMBULATORY_CARE_PROVIDER_SITE_OTHER): Payer: Medicare Other | Admitting: Cardiovascular Disease

## 2013-10-07 VITALS — BP 120/50 | HR 70 | Ht 68.0 in | Wt 151.1 lb

## 2013-10-07 DIAGNOSIS — I359 Nonrheumatic aortic valve disorder, unspecified: Secondary | ICD-10-CM | POA: Diagnosis not present

## 2013-10-07 DIAGNOSIS — I6350 Cerebral infarction due to unspecified occlusion or stenosis of unspecified cerebral artery: Secondary | ICD-10-CM | POA: Diagnosis not present

## 2013-10-07 NOTE — Patient Instructions (Signed)
Your physician wants you to follow-up in: 6 months  You will receive a reminder letter in the mail two months in advance. If you don't receive a letter, please call our office to schedule the follow-up appointment.  Your physician recommends that you continue on your current medications as directed. Please refer to the Current Medication list given to you today.  

## 2013-10-07 NOTE — Progress Notes (Signed)
Bryan Moore Date of Birth  1920-02-14       [facility]    [employer] [address], [place], [place]  [address]   [place], [place]  [address] [phone]     [phone]   Fax  [phone]    Fax [phone]  Problem List: 1. Aortic stenosis-status post TAVR,  2012 2. AV formations with recurrent GI bleeds 3. hypertension 4. Hyperlipidemia. 5. Shingles 6. Frequent Falls ( falls backwards) 7. CVA  History of Present Illness:  Pt has been doing well from a cardiac standpoint.  He denies any episodes of chest pain or shortness breath. He has been falling recently and stopped his lisinopril because he thought it was causing him to have some dizziness. He's been falling backwards.  March 02, 2013:  Bryan Moore is doing well.  He has some dyspnea - especially with exertion.  Still have balance issues.   October 07, 2013:  Bryan Moore has had a stroke since I last saw him.  No CP, no dyspnea.  He was seen with his wife who answered most of the questions   Current Outpatient Prescriptions on File Prior to Visit  Medication Sig Dispense Refill  . acetaminophen (TYLENOL) 325 MG tablet Take 650 mg by mouth every 6 (six) hours as needed for moderate pain.       Marland Kitchen amLODipine (NORVASC) 5 MG tablet Take 1 tablet (5 mg total) by mouth daily.  30 tablet  1  . atorvastatin (LIPITOR) 10 MG tablet Take 1 tablet (10 mg total) by mouth daily at 6 PM.  30 tablet  1  . brimonidine (ALPHAGAN P) 0.1 % SOLN Place 1 drop into both eyes 2 (two) times daily.       . cholecalciferol (VITAMIN D) 1000 UNITS tablet Take 1,000 Units by mouth every morning.       . dorzolamide-timolol (COSOPT) 22.3-6.8 MG/ML ophthalmic solution Place 1 drop into both eyes every 12 (twelve) hours.       . iron polysaccharides (NIFEREX) 150 MG capsule Take 150 mg by mouth every morning.       . latanoprost (XALATAN) 0.005 % ophthalmic solution Place 1 drop into both eyes at  bedtime.       . Multiple Vitamin (MULTIVITAMIN WITH MINERALS) TABS tablet Take 1 tablet by mouth every morning.       . traMADol (ULTRAM) 50 MG tablet Take 1 tablet (50 mg total) by mouth every 6 (six) hours as needed.  12 tablet  1  . vitamin B-12 (CYANOCOBALAMIN) 250 MCG tablet Take 250 mcg by mouth every morning.        No current facility-administered medications on file prior to visit.    Allergies  Allergen Reactions  . Advil [Ibuprofen] Other (See Comments)    Causes stomach bleeding, need transfusions as result  . Aspirin Other (See Comments)    Causes stomach bleeding, needs transfusion as result  . Penicillins Itching and Swelling  . Ativan [Lorazepam] Anxiety    He became very anxious and aggitated    Past Medical History  Diagnosis Date  . Anemia     from AVM's. Requiring transfusion  . Hyperlipemia   . Aortic stenosis     s/p TAVI July 2012  . Glaucoma   . Mitral regurgitation   . Shingles May 2013    right arm residual pain- last outbreak 1 1/2 yrs ago.  Marland Kitchen Hypertension  Not taking Lisinopril due to BP drops low.  . Transfusion history     last 2 yrs ago.  Marland Kitchen GERD (gastroesophageal reflux disease)     RARE - AND NO MEDS  . Cancer     BLADDER CANCER  . Arthritis   . Balance problem     WEAK ON RIGHT SIDE ( SCOLISOSIS AND HX OF A BACK SURGERY) - BALANCE PROBLEM - "FALLS BACKWARD" - FREQUENT FALLS.  USES CANE WHEN AMBULATING  . Complication of anesthesia     VERY DROWSY FOR HOURS AFTER SURGERY - AND DISORIENTATION  . Heart murmur     PT'S CARDIOLOGIST IS DR. Acie Fredrickson -LAST OFFICE VISIT 03/02/13 IN [facility]  . Dementia     more memory problems from stroke    Past Surgical History  Procedure Laterality Date  . Appendectomy    . Cardiac catheterization  10/23/2010    ef 65%  . US echocardiography  08/31/10    EF 55-60%  . Cardiovascular stress test  01/01/2007    EF 64%, NO EVIDENCE OF ISCHEMIA  . Cardiac valve replacement      02/2011([facility])  .  Transurethral resection of bladder tumor with gyrus (turbt-gyrus) N/A 04/13/2013    Procedure: TRANSURETHRAL RESECTION OF BLADDER TUMOR WITH GYRUS (TURBT-GYRUS);  Surgeon: [name], MD;  Location: [facility];  Service: Urology;  Laterality: N/A;  . Cystoscopy with stent placement Left 04/13/2013    Procedure:  LEFT URETERAL STENT PLACEMENT;  Surgeon: [name], MD;  Location: [facility];  Service: Urology;  Laterality: Left;  . Back surgery  2010 OR 2011  . Transurethral resection of bladder tumor N/A 06/04/2013    Procedure: RESTAGING TRANSURETHRAL RESECTION OF BLADDER TUMOR (TURBT), URETERAL CATHERIZATION ;  Surgeon: [name], MD;  Location: [facility];  Service: Urology;  Laterality: N/A;  CYSTO RESTAGING TURBT     . Eye surgery      bilateral cataracts with lens implants  . Cystoscopy w/ retrogrades Bilateral 09/17/2013    Procedure: CYSTOSCOPY WITH BILATERAL RETROGRADE;  Surgeon: [name], MD;  Location: [facility];  Service: Urology;  Laterality: Bilateral;  . Transurethral resection of bladder tumor N/A 09/17/2013    Procedure: TRANSURETHRAL RESECTION OF BLADDER TUMOR WITH MITOMYCIN-C;  Surgeon: [name], MD;  Location: [facility];  Service: Urology;  Laterality: N/A;    History  Smoking status  . Former Smoker  . Quit date: 07/04/1981  Smokeless tobacco  . Not on file    History  Alcohol Use  . 0.6 oz/week  . 1 Glasses of wine per week    Comment: nightly    No family history on file.  Reviw of Systems:  Reviewed in the HPI.  All other systems are negative.  Physical Exam: Blood pressure 120/50, pulse 70, height 5\' 8"  (1.727 m), weight 151 lb 1.9 oz (68.548 kg). General: Well developed, well nourished, in no acute distress.  He has an abrasion in the right side of his forehead.    Head: Normocephalic, atraumatic, sclera non-icteric, mucus membranes are moist,   Neck: Supple. Carotids are 2 + without bruits. No JVD  Lungs: Clear bilaterally to auscultation.  Heart: regular  rate.  normal  S1 S2.  There is a 2 / 6 disastolic murmur c/w AI.  Abdomen: Soft, non-tender, non-distended with normal bowel sounds. No hepatomegaly. No rebound/guarding. No masses.  Msk:  Strength and tone are normal  Extremities: No clubbing or cyanosis. No edema.  Distal pedal pulses  are 2+ and equal bilaterally.  Neuro: Alert and oriented X 3. Moves all extremities spontaneously.  Psych:  Responds to questions appropriately with a normal affect.  ECG:  Assessment / Plan:

## 2013-10-07 NOTE — Assessment & Plan Note (Signed)
He seems to be doing well from a cardiac standpoint.   He had a small stroke recently.  We have been hesitant to start an ASA because he has GI AV malformations and has a tendency to have GI bleed.  His wife may sart ASA 81 QOD and see how he does.

## 2013-10-12 DIAGNOSIS — C675 Malignant neoplasm of bladder neck: Secondary | ICD-10-CM | POA: Diagnosis not present

## 2013-10-19 DIAGNOSIS — C679 Malignant neoplasm of bladder, unspecified: Secondary | ICD-10-CM | POA: Diagnosis not present

## 2013-10-21 DIAGNOSIS — H2640 Unspecified secondary cataract: Secondary | ICD-10-CM | POA: Diagnosis not present

## 2013-10-21 DIAGNOSIS — H4011X0 Primary open-angle glaucoma, stage unspecified: Secondary | ICD-10-CM | POA: Diagnosis not present

## 2013-10-21 DIAGNOSIS — H409 Unspecified glaucoma: Secondary | ICD-10-CM | POA: Diagnosis not present

## 2013-10-21 DIAGNOSIS — Z961 Presence of intraocular lens: Secondary | ICD-10-CM | POA: Diagnosis not present

## 2013-10-22 DIAGNOSIS — R4182 Altered mental status, unspecified: Secondary | ICD-10-CM | POA: Diagnosis not present

## 2013-10-22 DIAGNOSIS — E7800 Pure hypercholesterolemia, unspecified: Secondary | ICD-10-CM | POA: Diagnosis not present

## 2013-10-22 DIAGNOSIS — I10 Essential (primary) hypertension: Secondary | ICD-10-CM | POA: Diagnosis not present

## 2013-10-26 DIAGNOSIS — C679 Malignant neoplasm of bladder, unspecified: Secondary | ICD-10-CM | POA: Diagnosis not present

## 2013-10-28 DIAGNOSIS — I10 Essential (primary) hypertension: Secondary | ICD-10-CM | POA: Diagnosis not present

## 2013-10-28 DIAGNOSIS — F02818 Dementia in other diseases classified elsewhere, unspecified severity, with other behavioral disturbance: Secondary | ICD-10-CM | POA: Diagnosis not present

## 2013-10-28 DIAGNOSIS — E7800 Pure hypercholesterolemia, unspecified: Secondary | ICD-10-CM | POA: Diagnosis not present

## 2013-10-28 DIAGNOSIS — I69928 Other speech and language deficits following unspecified cerebrovascular disease: Secondary | ICD-10-CM | POA: Diagnosis not present

## 2013-10-28 DIAGNOSIS — F0281 Dementia in other diseases classified elsewhere with behavioral disturbance: Secondary | ICD-10-CM | POA: Diagnosis not present

## 2013-10-28 DIAGNOSIS — R4182 Altered mental status, unspecified: Secondary | ICD-10-CM | POA: Diagnosis not present

## 2013-10-28 DIAGNOSIS — Z954 Presence of other heart-valve replacement: Secondary | ICD-10-CM | POA: Diagnosis not present

## 2013-10-29 ENCOUNTER — Other Ambulatory Visit: Payer: Self-pay | Admitting: Physical Medicine and Rehabilitation

## 2013-10-30 DIAGNOSIS — C679 Malignant neoplasm of bladder, unspecified: Secondary | ICD-10-CM | POA: Diagnosis not present

## 2013-10-30 DIAGNOSIS — I10 Essential (primary) hypertension: Secondary | ICD-10-CM | POA: Diagnosis not present

## 2013-10-30 DIAGNOSIS — M4120 Other idiopathic scoliosis, site unspecified: Secondary | ICD-10-CM | POA: Diagnosis not present

## 2013-10-30 DIAGNOSIS — D630 Anemia in neoplastic disease: Secondary | ICD-10-CM | POA: Diagnosis not present

## 2013-10-30 DIAGNOSIS — Z4789 Encounter for other orthopedic aftercare: Secondary | ICD-10-CM | POA: Diagnosis not present

## 2013-10-30 DIAGNOSIS — F0390 Unspecified dementia without behavioral disturbance: Secondary | ICD-10-CM | POA: Diagnosis not present

## 2013-11-02 DIAGNOSIS — C679 Malignant neoplasm of bladder, unspecified: Secondary | ICD-10-CM | POA: Diagnosis not present

## 2013-11-05 ENCOUNTER — Encounter: Payer: Self-pay | Admitting: Physical Medicine and Rehabilitation

## 2013-11-05 ENCOUNTER — Encounter
Payer: Medicare Other | Attending: Physical Medicine and Rehabilitation | Admitting: Physical Medicine and Rehabilitation

## 2013-11-05 VITALS — BP 120/62 | HR 64 | Resp 14 | Ht 68.0 in | Wt 151.0 lb

## 2013-11-05 DIAGNOSIS — K219 Gastro-esophageal reflux disease without esophagitis: Secondary | ICD-10-CM | POA: Diagnosis not present

## 2013-11-05 DIAGNOSIS — M4800 Spinal stenosis, site unspecified: Secondary | ICD-10-CM | POA: Insufficient documentation

## 2013-11-05 DIAGNOSIS — C679 Malignant neoplasm of bladder, unspecified: Secondary | ICD-10-CM | POA: Insufficient documentation

## 2013-11-05 DIAGNOSIS — I6350 Cerebral infarction due to unspecified occlusion or stenosis of unspecified cerebral artery: Secondary | ICD-10-CM

## 2013-11-05 DIAGNOSIS — E785 Hyperlipidemia, unspecified: Secondary | ICD-10-CM | POA: Insufficient documentation

## 2013-11-05 DIAGNOSIS — I059 Rheumatic mitral valve disease, unspecified: Secondary | ICD-10-CM | POA: Diagnosis not present

## 2013-11-05 DIAGNOSIS — I10 Essential (primary) hypertension: Secondary | ICD-10-CM | POA: Diagnosis not present

## 2013-11-05 DIAGNOSIS — I639 Cerebral infarction, unspecified: Secondary | ICD-10-CM

## 2013-11-05 DIAGNOSIS — F0390 Unspecified dementia without behavioral disturbance: Secondary | ICD-10-CM | POA: Insufficient documentation

## 2013-11-05 DIAGNOSIS — I6990 Unspecified sequelae of unspecified cerebrovascular disease: Secondary | ICD-10-CM | POA: Insufficient documentation

## 2013-11-05 MED ORDER — AMLODIPINE BESYLATE 5 MG PO TABS: 5.0000 mg | ORAL_TABLET | Freq: Every day | ORAL | Status: DC

## 2013-11-05 NOTE — Patient Instructions (Signed)
Ice to back after activity. Sportscreme to left to hip twice a day. Walk 5 minutes twice a day.

## 2013-11-05 NOTE — Progress Notes (Signed)
Subjective:    Patient ID: Bryan Moore, male    DOB: April 21, 1920, 80 y.o.   MRN: [id number]  HPI  JEANCLAUDE Moore is a 80 y.o. male with h/o AVM, bladder cancer (pending surgery for TUR recurrent tumor), gait disorder as well as underlying dementia who was admitted 08/18/13 with 2-3 days of worsening confusion.  MRI brain done revealing subacute nonhemorrhagic infarct involving the left globus pallidus and medial basal ganglia. He was admitted to CIR on 08/24/13--08/28/13. He continued to have bouts of confusion which were worse at nights. He required supervision for transfers and ambulation with use of walker at discharge. He continued to require Min-Mod A for selective attention, intellectual awareness and functional problem solving. He needed  Max-total cues  for utilization of external memory aids to recall daily information.  He has completed HHPT and is ambulating with use of a cane. He is able to walk two block before onset of left hip pain and has history of spinal stenosis. Wife did report that use of walker helped to decrease discomfort but patient was not using this due to pride. He continues to have significant cognitive deficits past discharge. He was unable to recognize their home of 26 years and kept wanting to go "back to his home". He continues to be unable to remember their home address despite daily reminders and lacks short term memory and has lack of insight into deficits.  He is able to complete basic ADL tasks with supervision but continues to require 24 hours supervision due to cognitive deficits. Speech therapy signed off due to poor recall, poor carryover and lack of progress    Pain Inventory Average Pain 4 Pain Right Now 4 My pain is dull  In the last 24 hours, has pain interfered with the following? General activity 5 Relation with others 5 Enjoyment of life 5 What TIME of day is your pain at its worst? varies Sleep (in general) Good  Pain is worse with:  walking Pain improves with: rest Relief from Meds: not taking pain med  Mobility walk with assistance use a cane use a walker ability to climb steps?  no do you drive?  no  Function retired I need assistance with the following:  meal prep, household duties and shopping  Neuro/Psych bladder control problems weakness tingling trouble walking spasms confusion  Prior Studies Any changes since last visit?  no  Physicians involved in your care Any changes since last visit?  no   History reviewed. No pertinent family history. History   Social History  . Marital Status: Married    Spouse Name: N/A    Number of Children: N/A  . Years of Education: N/A   Social History Main Topics  . Smoking status: Former Smoker    Quit date: 07/04/1981  . Smokeless tobacco: Never Used  . Alcohol Use: 0.6 oz/week    1 Glasses of wine per week     Comment: nightly  . Drug Use: No  . Sexual Activity: Not Currently   Other Topics Concern  . None   Social History Narrative  . None   Past Surgical History  Procedure Laterality Date  . Appendectomy    . Cardiac catheterization  10/23/2010    ef 65%  . US echocardiography  08/31/10    EF 55-60%  . Cardiovascular stress test  01/01/2007    EF 64%, NO EVIDENCE OF ISCHEMIA  . Cardiac valve replacement      02/2011([facility])  .  Transurethral resection of bladder tumor with gyrus (turbt-gyrus) N/A 04/13/2013    Procedure: TRANSURETHRAL RESECTION OF BLADDER TUMOR WITH GYRUS (TURBT-GYRUS);  Surgeon: [name], MD;  Location: [facility];  Service: Urology;  Laterality: N/A;  . Cystoscopy with stent placement Left 04/13/2013    Procedure:  LEFT URETERAL STENT PLACEMENT;  Surgeon: [name], MD;  Location: [facility];  Service: Urology;  Laterality: Left;  . Back surgery  2010 OR 2011  . Transurethral resection of bladder tumor N/A 06/04/2013    Procedure: RESTAGING TRANSURETHRAL RESECTION OF BLADDER TUMOR (TURBT), URETERAL CATHERIZATION ;   Surgeon: [name], MD;  Location: [facility];  Service: Urology;  Laterality: N/A;  CYSTO RESTAGING TURBT     . Eye surgery      bilateral cataracts with lens implants  . Cystoscopy w/ retrogrades Bilateral 09/17/2013    Procedure: CYSTOSCOPY WITH BILATERAL RETROGRADE;  Surgeon: [name], MD;  Location: [facility];  Service: Urology;  Laterality: Bilateral;  . Transurethral resection of bladder tumor N/A 09/17/2013    Procedure: TRANSURETHRAL RESECTION OF BLADDER TUMOR WITH MITOMYCIN-C;  Surgeon: [name], MD;  [facility]: [facility];  Service: Urology;  Laterality: N/A;   Past Medical History  Diagnosis Date  . Anemia     from AVM's. Requiring transfusion  . Hyperlipemia   . Aortic stenosis     s/p TAVI July 2012  . Glaucoma   . Mitral regurgitation   . Shingles May 2013    right arm residual pain- last outbreak 1 1/2 yrs ago.  Marland Kitchen Hypertension     Not taking Lisinopril due to BP drops low.  . Transfusion history     last 2 yrs ago.  Marland Kitchen GERD (gastroesophageal reflux disease)     RARE - AND NO MEDS  . Cancer     BLADDER CANCER  . Arthritis   . Balance problem     WEAK ON RIGHT SIDE ( SCOLISOSIS AND HX OF A BACK SURGERY) - BALANCE PROBLEM - "FALLS BACKWARD" - FREQUENT FALLS.  USES CANE WHEN AMBULATING  . Complication of anesthesia     VERY DROWSY FOR HOURS AFTER SURGERY - AND DISORIENTATION  . Heart murmur     PT'S CARDIOLOGIST IS DR. Acie Fredrickson -LAST OFFICE VISIT 03/02/13 IN [facility]  . Dementia     more memory problems from stroke  . Stroke    BP 120/62  Pulse 64  Resp 14  Ht 5\' 8"  (1.727 m)  Wt 151 lb (68.493 kg)  BMI 22.96 kg/m2  SpO2 99%  Opioid Risk Score:   Fall Risk Score: High Fall Risk (>13 points) (educated and handout given for preventing falls in the home)  Review of Systems  Genitourinary:       Bladder control problems  Musculoskeletal: Positive for gait problem.       Spasms  Neurological:       Memory, tingling  Psychiatric/Behavioral: Positive for confusion.  All  other systems reviewed and are negative.       Objective:   Physical Exam  Nursing note and vitals reviewed. Constitutional: He appears well-developed and well-nourished.  HENT:  Head: Normocephalic and atraumatic.  Eyes: Conjunctivae are normal. Pupils are equal, round, and reactive to light.  Neck: Normal range of motion. Neck supple.  Cardiovascular: Regular rhythm.   Pulmonary/Chest: Effort normal and breath sounds normal. No respiratory distress. He has no wheezes.  Abdominal: Soft. Bowel sounds are normal.  Musculoskeletal: He exhibits no edema and no tenderness.  Neurological: He is alert.  Oriented to self. Was unable to recall hospitalization and denied any problems. Was unsure of why he was there. Unable to recall month, date or year. Knew some holiday was coming up "4th of July". Has poor insight, impaired awareness, poor memory as well as poor judgement.  Able to follow simple one step commands. Motor strength RUE 4+/5 and RLE 4+/5 except 4-/5 at right HF. LUE/LLE with 4+/5 strength. Mild decrease in fine motor movements of RUE with finger to nose.  He had minimal difficulty getting out of the chair and has mild instability with turns. No LOB.   Skin: Skin is warm and dry.  Psychiatric: His speech is normal. His affect is blunt. He is slowed and withdrawn. He expresses inappropriate judgment. He exhibits abnormal recent memory and abnormal remote memory.          Assessment & Plan:  1. Left Globus pallidus and medial basal ganglia infarct: Continues to have mild decrease in fine motor movements on right with decrease in RLE strength as well as high level balance deficits due to his refusal to use a walker. He was referred to outpatient therapy for continued physical therapy.  2. Spinal stenosis with LBP and left hip pain: Decreased with use of walker per wife. Patient with poor memory and continued to deny this multiple times. She plans on following up with Dr. Ellene Route for  input.   3. Progressive dementia:  Memory and confusion did worsen for a few weeks past discharge. This is stabilizing with his acceptance of his surroundings as well as their relationship. He has wandered out on occasion when she was distracted but he did not beyond the yard. We discussed safety strategies as well as use of GPS type of device (watch)  to keep track of him in case he wanders off. Asked her to involve her son to research into this issue. She has hidden keys to her car and bolts the door when home

## 2013-11-06 ENCOUNTER — Encounter: Payer: Self-pay | Admitting: Physical Medicine and Rehabilitation

## 2013-11-06 DIAGNOSIS — D630 Anemia in neoplastic disease: Secondary | ICD-10-CM | POA: Diagnosis not present

## 2013-11-06 DIAGNOSIS — M4120 Other idiopathic scoliosis, site unspecified: Secondary | ICD-10-CM | POA: Diagnosis not present

## 2013-11-06 DIAGNOSIS — F0390 Unspecified dementia without behavioral disturbance: Secondary | ICD-10-CM | POA: Diagnosis not present

## 2013-11-06 DIAGNOSIS — C679 Malignant neoplasm of bladder, unspecified: Secondary | ICD-10-CM | POA: Diagnosis not present

## 2013-11-06 DIAGNOSIS — I10 Essential (primary) hypertension: Secondary | ICD-10-CM | POA: Diagnosis not present

## 2013-11-06 DIAGNOSIS — Z4789 Encounter for other orthopedic aftercare: Secondary | ICD-10-CM | POA: Diagnosis not present

## 2013-11-09 DIAGNOSIS — C679 Malignant neoplasm of bladder, unspecified: Secondary | ICD-10-CM | POA: Diagnosis not present

## 2013-11-12 ENCOUNTER — Encounter: Payer: Self-pay | Admitting: Neurology

## 2013-11-12 ENCOUNTER — Ambulatory Visit (INDEPENDENT_AMBULATORY_CARE_PROVIDER_SITE_OTHER): Payer: Medicare Other | Admitting: Neurology

## 2013-11-12 VITALS — BP 114/57 | HR 68 | Ht 63.5 in | Wt 151.0 lb

## 2013-11-12 DIAGNOSIS — R413 Other amnesia: Secondary | ICD-10-CM | POA: Diagnosis not present

## 2013-11-12 DIAGNOSIS — F0390 Unspecified dementia without behavioral disturbance: Secondary | ICD-10-CM

## 2013-11-12 DIAGNOSIS — I6381 Other cerebral infarction due to occlusion or stenosis of small artery: Secondary | ICD-10-CM | POA: Insufficient documentation

## 2013-11-12 DIAGNOSIS — I6350 Cerebral infarction due to unspecified occlusion or stenosis of unspecified cerebral artery: Secondary | ICD-10-CM

## 2013-11-12 NOTE — Progress Notes (Signed)
[facility] [address] [place]. [place] [address] [phone]       OFFICE FOLLOW-UP NOTE  Bryan. Bryan Moore Date of Birth:  12/23/1919 Medical Record Number:  [id number]   HPI: Bryan Moore is a 61 year Caucasian male who is seen today for followup after hospital admission for stroke in January 2015. He was admitted with 3 day history of worsening confusion and some speech and balance difficulties with right-sided weakness. CT scan of the head showed a subacute left subcortical lacunar infarct and subsequent MRI scan confirmed this to be acute to subacute. MRI of the brain revealed no large vessel stenosis. Transthoracic echo showed slightly reduced ejection fraction of 4550% with diffuse hypokinesis but no definite clot. Hemoglobin A1c was 5.2. Lipid profile was unremarkable. Patient had a history of GI bleed in the recent past on aspirin and was found to have GI tract AVMs and hence was not started on aspirin. The patient's wife states that since discharge he has had significant memory and cognitive impairment and is no longer able to handle his own affairs. On inquiry she does admit that he may have had some mild memory problem before but they were not as bad. Patient gets confused and disoriented easily. He has poor short-term memory. Hemostasis objects and cannot find them. She has had to help him increasingly more and more with his daily routine. The patient underwent surgery for bladder cancer in mid February which went well and he has just finished 6 weeks of bladder wash. He has not been evaluated for treatable causes of dementia. He has no history of seizures significant head injury with loss of consciousness no family history of dementia.  ROS:   14 system review of systems is positive for memory loss, fatigue, murmur, incontinence, feeling cold, anemia, confusion and dizziness and all other systems negative PMH:  Past Medical History  Diagnosis Date  . Anemia    from AVM's. Requiring transfusion  . Hyperlipemia   . Aortic stenosis     s/p TAVI July 2012  . Glaucoma   . Mitral regurgitation   . Shingles May 2013    right arm residual pain- last outbreak 1 1/2 yrs ago.  Marland Kitchen Hypertension     Not taking Lisinopril due to BP drops low.  . Transfusion history     last 2 yrs ago.  Marland Kitchen GERD (gastroesophageal reflux disease)     RARE - AND NO MEDS  . Cancer     BLADDER CANCER  . Arthritis   . Balance problem     WEAK ON RIGHT SIDE ( SCOLISOSIS AND HX OF A BACK SURGERY) - BALANCE PROBLEM - "FALLS BACKWARD" - FREQUENT FALLS.  USES CANE WHEN AMBULATING  . Complication of anesthesia     VERY DROWSY FOR HOURS AFTER SURGERY - AND DISORIENTATION  . Heart murmur     PT'S CARDIOLOGIST IS DR. Acie Fredrickson -LAST OFFICE VISIT 03/02/13 IN [facility]  . Dementia     more memory problems from stroke  . Stroke     Social History:  History   Social History  . Marital Status: Married    Spouse Name: N/A    Number of Children: 2co  . Years of Education: college   Occupational History  . Not on file.   Social History Main Topics  . Smoking status: Former Smoker    Quit date: 07/04/1981  . Smokeless tobacco: Never Used  . Alcohol Use: 0.6 oz/week    1  Glasses of wine per week     Comment: nightly  . Drug Use: No  . Sexual Activity: Not Currently   Other Topics Concern  . Not on file   Social History Narrative   Patient lives at home with his wife           Medications:   Current Outpatient Prescriptions on File Prior to Visit  Medication Sig Dispense Refill  . acetaminophen (TYLENOL) 325 MG tablet Take 650 mg by mouth every 6 (six) hours as needed for moderate pain.       Marland Kitchen amLODipine (NORVASC) 5 MG tablet Take 1 tablet (5 mg total) by mouth daily.  30 tablet  1  . atorvastatin (LIPITOR) 10 MG tablet Take 1 tablet (10 mg total) by mouth daily at 6 PM.  30 tablet  1  . brimonidine (ALPHAGAN P) 0.1 % SOLN Place 1 drop into both eyes 2 (two) times daily.        . cholecalciferol (VITAMIN D) 1000 UNITS tablet Take 1,000 Units by mouth every morning.       . dorzolamide-timolol (COSOPT) 22.3-6.8 MG/ML ophthalmic solution Place 1 drop into both eyes every 12 (twelve) hours.       . iron polysaccharides (NIFEREX) 150 MG capsule Take 150 mg by mouth every morning.       . latanoprost (XALATAN) 0.005 % ophthalmic solution Place 1 drop into both eyes at bedtime.       . Multiple Vitamin (MULTIVITAMIN WITH MINERALS) TABS tablet Take 1 tablet by mouth every morning.       . traMADol (ULTRAM) 50 MG tablet Take 1 tablet (50 mg total) by mouth every 6 (six) hours as needed.  12 tablet  1  . vitamin B-12 (CYANOCOBALAMIN) 250 MCG tablet Take 250 mcg by mouth every morning.        No current facility-administered medications on file prior to visit.    Allergies:   Allergies  Allergen Reactions  . Advil [Ibuprofen] Other (See Comments)    Causes stomach bleeding, need transfusions as result  . Aspirin Other (See Comments)    Causes stomach bleeding, needs transfusion as result  . Penicillins Itching and Swelling  . Ativan [Lorazepam] Anxiety    He became very anxious and aggitated    Physical Exam General: Pleasant elderly Caucasian male seated, in no evident distress Head: head normocephalic and atraumatic. Orohparynx benign Neck: supple with no carotid or supraclavicular bruits Cardiovascular: regular rate and rhythm, no murmurs Musculoskeletal: no deformity Skin:  no rash/petichiae Vascular:  Normal pulses all extremities Filed Vitals:   11/12/13 1430  BP: 114/57  Pulse: 68   Neurologic Exam Mental Status: Awake and fully alert. Disoriented to place and time. Recent and remote memory poort. Attention span, concentration and fund of knowledge diminished.MMSE 12/30. AFT 5 only. Geriatric Depression scale 2 only.. Mood and affect appropriate.  Cranial Nerves: Fundoscopic exam reveals sharp disc margins. Pupils equal, briskly reactive to light.  Extraocular movements full without nystagmus. Visual fields full to confrontation. Hearing intact. Facial sensation intact. Face, tongue, palate moves normally and symmetrically.  Motor: Normal bulk and tone. Normal strength in all tested extremity muscles. Diminished fine finger movements on the right. The minimum right grip weakness. Orbits left over right upper extremity.  Sensory.: intact to touch and pinprick and vibratory sensation sensation.  Coordination: Rapid alternating movements normal in all extremities. Finger-to-nose and heel-to-shin performed accurately bilaterally. Gait and Station: Arises from chair without difficulty. Stance is  slightly stooped. Gait demonstrates normal stride length and balance . Unable to heel, toe and tandem walk without difficulty.  Reflexes: 1+ and symmetric. Toes downgoing.   NIHSS  2 Modified Rankin  3   ASSESSMENT: 40 Caucasian male with suspect prior mild cognitive impairment which has now converted to dementia following left subcortical infarctin January 2015 He has not been placed on aspirin due to history of recurrent GI bleeds from AVMs on aspirin in the past .   PLAN: I had a long discussion with the patient and wife regarding his recent stroke as well as reviewed his cognitive decline and memory loss following stroke and discussed diagnostic and treatment options and answered questions. Recommend he consider starting aspirin 81 mg every other day for stroke prevention if approved by his gastroenterologist given recent history of GI hemorrhage. Maintain strict control of hypertension and lipids. Check lab work for treatable causes of memory loss, EEG and CT scan of the head. The patient's wife is reluctant to consider starting Aricept or Namenda given side effect profile but she will call me if she changes her mind. Return for followup in 2 months or call earlier if necessary. This was a prolonged visit requiring extensive history taking, review of  data, medical decision making of high complexity and I spent 60 minutes in direct face-to-face patient visit as well as coordination of his care.    Note: This document was prepared with digital dictation and possible smart phrase technology. Any transcriptional errors that result from this process are unintentional

## 2013-11-12 NOTE — Patient Instructions (Signed)
I had a long discussion with the patient and wife regarding his recent stroke as well as reviewed his cognitive decline and memory loss following stroke and discuss diagnostic and treatment options and answered questions. Recommend he consider starting aspirin 81 mg every other day if approved by his gastroenterologist given recent history of GI hemorrhage. Maintain strict control of hypertension and lipids. Check lab work for treatable causes of memory loss, EEG and CT scan of the head. The patient's wife is reluctant to consider starting Aricept or Namenda given side effect profile but she will call me if she changes her mind. Return for followup in 2 months or call earlier if necessary  Stroke Prevention Some medical conditions and behaviors are associated with an increased chance of having a stroke. You may prevent a stroke by making healthy choices and managing medical conditions. HOW CAN I REDUCE MY RISK OF HAVING A STROKE?   Stay physically active. Get at least 30 minutes of activity on most or all days.  Do not smoke. It may also be helpful to avoid exposure to secondhand smoke.  Limit alcohol use. Moderate alcohol use is considered to be:  No more than 2 drinks per day for men.  No more than 1 drink per day for nonpregnant women.  Eat healthy foods. This involves  Eating 5 or more servings of fruits and vegetables a day.  Following a diet that addresses high blood pressure (hypertension), high cholesterol, diabetes, or obesity.  Manage your cholesterol levels.  A diet low in saturated fat, trans fat, and cholesterol and high in fiber may control cholesterol levels.  Take any prescribed medicines to control cholesterol as directed by your health care provider.  Manage your diabetes.  A controlled-carbohydrate, controlled-sugar diet is recommended to manage diabetes.  Take any prescribed medicines to control diabetes as directed by your health care provider.  Control your  hypertension.  A low-salt (sodium), low-saturated fat, low-trans fat, and low-cholesterol diet is recommended to manage hypertension.  Take any prescribed medicines to control hypertension as directed by your health care provider.  Maintain a healthy weight.  A reduced-calorie, low-sodium, low-saturated fat, low-trans fat, low-cholesterol diet is recommended to manage weight.  Stop drug abuse.  Avoid taking birth control pills.  Talk to your health care provider about the risks of taking birth control pills if you are over 73 years old, smoke, get migraines, or have ever had a blood clot.  Get evaluated for sleep disorders (sleep apnea).  Talk to your health care provider about getting a sleep evaluation if you snore a lot or have excessive sleepiness.  Take medicines as directed by your health care provider.  For some people, aspirin or blood thinners (anticoagulants) are helpful in reducing the risk of forming abnormal blood clots that can lead to stroke. If you have the irregular heart rhythm of atrial fibrillation, you should be on a blood thinner unless there is a good reason you cannot take them.  Understand all your medicine instructions.  Make sure that other other conditions (such as anemia or atherosclerosis) are addressed. SEEK IMMEDIATE MEDICAL CARE IF:   You have sudden weakness or numbness of the face, arm, or leg, especially on one side of the body.  Your face or eyelid droops to one side.  You have sudden confusion.  You have trouble speaking (aphasia) or understanding.  You have sudden trouble seeing in one or both eyes.  You have sudden trouble walking.  You have dizziness.  You have a loss of balance or coordination.  You have a sudden, severe headache with no known cause.  You have new chest pain or an irregular heartbeat. Any of these symptoms may represent a serious problem that is an emergency. Do not wait to see if the symptoms will go away. Get  medical help at once. Call your local emergency services  (911 in [place] Do not drive yourself to the hospital. Document Released: 08/30/2004 Document Revised: 05/13/2013 Document Reviewed: 01/23/2013 [facility] Patient Information 2014 [place].    Dementia Dementia is a general term for problems with brain function. A person with dementia has memory loss and a hard time with at least one other brain function such as thinking, speaking, or problem solving. Dementia can affect social functioning, how you do your job, your mood, or your personality. The changes may be hidden for a long time. The earliest forms of this disease are usually not detected by family or friends. Dementia can be:  Irreversible.  Potentially reversible.  Partially reversible.  Progressive. This means it can get worse over time. CAUSES  Irreversible dementia causes may include:  Degeneration of brain cells (Alzheimer's disease or lewy body dementia).  Multiple small strokes (vascular dementia).  Infection (chronic meningitis or Creutzfelt-Jakob disease).  Frontotemporal dementia. This affects younger people, age 45 to 49, compared to those who have Alzheimer's disease.  Dementia associated with other disorders like Parkinson's disease, Huntington's disease, or HIV-associated dementia. Potentially or partially reversible dementia causes may include:  Medicines.  Metabolic causes such as excessive alcohol intake, vitamin B12 deficiency, or thyroid disease.  Masses or pressure in the brain such as a tumor, blood clot, or hydrocephalus. SYMPTOMS  Symptoms are often hard to detect. Family members or coworkers may not notice them early in the disease process. Different people with dementia may have different symptoms. Symptoms can include:  A hard time with memory, especially recent memory. Long-term memory may not be impaired.  Asking the same question multiple times or forgetting something someone just  said.  A hard time speaking your thoughts or finding certain words.  A hard time solving problems or performing familiar tasks (such as how to use a telephone).  Sudden changes in mood.  Changes in personality, especially increasing moodiness or mistrust.  Depression.  A hard time understanding complex ideas that were never a problem in the past. DIAGNOSIS  There are no specific tests for dementia.   Your caregiver may recommend a thorough evaluation. This is because some forms of dementia can be reversible. The evaluation will likely include a physical exam and getting a detailed history from you and a family member. The history often gives the best clues and suggestions for a diagnosis.  Memory testing may be done. A detailed brain function evaluation called neuropsychologic testing may be helpful.  Lab tests and brain imaging (such as a CT scan or MRI scan) are sometimes important.  Sometimes observation and re-evaluation over time is very helpful. TREATMENT  Treatment depends on the cause.   If the problem is a vitamin deficiency, it may be helped or cured with supplements.  For dementias such as Alzheimer's disease, medicines are available to stabilize or slow the course of the disease. There are no cures for this type of dementia.  Your caregiver can help direct you to groups, organizations, and other caregivers to help with decisions in the care of you or your loved one. HOME CARE INSTRUCTIONS The care of individuals with dementia  is varied and dependent upon the progression of the dementia. The following suggestions are intended for the person living with, or caring for, the person with dementia.  Create a safe environment.  Remove the locks on bathroom doors to prevent the person from accidentally locking himself or herself in.  Use childproof latches on kitchen cabinets and any place where cleaning supplies, chemicals, or alcohol are kept.  Use childproof covers in  unused electrical outlets.  Install childproof devices to keep doors and windows secured.  Remove stove knobs or install safety knobs and an automatic shut-off on the stove.  Lower the temperature on water heaters.  Label medicines and keep them locked up.  Secure knives, lighters, matches, power tools, and guns, and keep these items out of reach.  Keep the house free from clutter. Remove rugs or anything that might contribute to a fall.  Remove objects that might break and hurt the person.  Make sure lighting is good, both inside and outside.  Install grab rails as needed.  Use a monitoring device to alert you to falls or other needs for help.  Reduce confusion.  Keep familiar objects and people around.  Use night lights or dim lights at night.  Label items or areas.  Use reminders, notes, or directions for daily activities or tasks.  Keep a simple, consistent routine for waking, meals, bathing, dressing, and bedtime.  Create a calm, quiet environment.  Place large clocks and calendars prominently.  Display emergency numbers and home address near all telephones.  Use cues to establish different times of the day. An example is to open curtains to let the natural light in during the day.   Use effective communication.  Choose simple words and short sentences.  Use a gentle, calm tone of voice.  Be careful not to interrupt.  If the person is struggling to find a word or communicate a thought, try to provide the word or thought.  Ask one question at a time. Allow the person ample time to answer questions. Repeat the question again if the person does not respond.  Reduce nighttime restlessness.  Provide a comfortable bed.  Have a consistent nighttime routine.  Ensure a regular walking or physical activity schedule. Involve the person in daily activities as much as possible.  Limit napping during the day.  Limit caffeine.  Attend social events that  stimulate rather than overwhelm the senses.  Encourage good nutrition and hydration.  Reduce distractions during meal times and snacks.  Avoid foods that are too hot or too cold.  Monitor chewing and swallowing ability.  Continue with routine vision, hearing, dental, and medical screenings.  Only give over-the-counter or prescription medicines as directed by the caregiver.  Monitor driving abilities. Do not allow the person to drive when safe driving is no longer possible.  Register with an identification program which could provide location assistance in the event of a missing person situation. SEEK MEDICAL CARE IF:   New behavioral problems start such as moodiness, aggressiveness, or seeing things that are not there (hallucinations).  Any new problem with brain function happens. This includes problems with balance, speech, or falling a lot.  Problems with swallowing develop.  Any symptoms of other illness happen. Small changes or worsening in any aspect of brain function can be a sign that the illness is getting worse. It can also be a sign of another medical illness such as infection. Seeing a caregiver right away is important. SEEK IMMEDIATE MEDICAL CARE IF:  A fever develops.  New or worsened confusion develops.  New or worsened sleepiness develops.  Staying awake becomes hard to do. Document Released: 01/16/2001 Document Revised: 10/15/2011 Document Reviewed: 12/18/2010 [facility] Patient Information 2014 [place], [place].

## 2013-11-13 ENCOUNTER — Ambulatory Visit: Payer: Medicare Other | Attending: Physical Medicine and Rehabilitation | Admitting: Physical Therapy

## 2013-11-13 DIAGNOSIS — M6281 Muscle weakness (generalized): Secondary | ICD-10-CM | POA: Diagnosis not present

## 2013-11-13 DIAGNOSIS — R262 Difficulty in walking, not elsewhere classified: Secondary | ICD-10-CM | POA: Insufficient documentation

## 2013-11-13 DIAGNOSIS — Z8673 Personal history of transient ischemic attack (TIA), and cerebral infarction without residual deficits: Secondary | ICD-10-CM | POA: Diagnosis not present

## 2013-11-13 DIAGNOSIS — R279 Unspecified lack of coordination: Secondary | ICD-10-CM | POA: Diagnosis not present

## 2013-11-13 DIAGNOSIS — IMO0001 Reserved for inherently not codable concepts without codable children: Secondary | ICD-10-CM | POA: Diagnosis not present

## 2013-11-16 ENCOUNTER — Ambulatory Visit: Payer: Medicare Other | Admitting: Physical Therapy

## 2013-11-16 DIAGNOSIS — M6281 Muscle weakness (generalized): Secondary | ICD-10-CM | POA: Diagnosis not present

## 2013-11-16 DIAGNOSIS — R279 Unspecified lack of coordination: Secondary | ICD-10-CM | POA: Diagnosis not present

## 2013-11-16 DIAGNOSIS — R262 Difficulty in walking, not elsewhere classified: Secondary | ICD-10-CM | POA: Diagnosis not present

## 2013-11-16 DIAGNOSIS — IMO0001 Reserved for inherently not codable concepts without codable children: Secondary | ICD-10-CM | POA: Diagnosis not present

## 2013-11-16 DIAGNOSIS — Z8673 Personal history of transient ischemic attack (TIA), and cerebral infarction without residual deficits: Secondary | ICD-10-CM | POA: Diagnosis not present

## 2013-11-17 ENCOUNTER — Other Ambulatory Visit (INDEPENDENT_AMBULATORY_CARE_PROVIDER_SITE_OTHER): Payer: Self-pay

## 2013-11-17 ENCOUNTER — Other Ambulatory Visit (INDEPENDENT_AMBULATORY_CARE_PROVIDER_SITE_OTHER): Payer: Medicare Other | Admitting: Radiology

## 2013-11-17 DIAGNOSIS — R413 Other amnesia: Secondary | ICD-10-CM | POA: Diagnosis not present

## 2013-11-17 DIAGNOSIS — Z0289 Encounter for other administrative examinations: Secondary | ICD-10-CM

## 2013-11-18 LAB — TSH: TSH: 2.24 u[IU]/mL (ref 0.450–4.500)

## 2013-11-18 LAB — VITAMIN B12: VITAMIN B 12: 1314 pg/mL — AB (ref 211–946)

## 2013-11-18 LAB — RPR: RPR: NONREACTIVE

## 2013-11-19 DIAGNOSIS — D630 Anemia in neoplastic disease: Secondary | ICD-10-CM | POA: Diagnosis not present

## 2013-11-19 DIAGNOSIS — I10 Essential (primary) hypertension: Secondary | ICD-10-CM | POA: Diagnosis not present

## 2013-11-19 DIAGNOSIS — F0390 Unspecified dementia without behavioral disturbance: Secondary | ICD-10-CM | POA: Diagnosis not present

## 2013-11-19 DIAGNOSIS — C679 Malignant neoplasm of bladder, unspecified: Secondary | ICD-10-CM | POA: Diagnosis not present

## 2013-11-19 DIAGNOSIS — Z4789 Encounter for other orthopedic aftercare: Secondary | ICD-10-CM | POA: Diagnosis not present

## 2013-11-19 DIAGNOSIS — M4120 Other idiopathic scoliosis, site unspecified: Secondary | ICD-10-CM | POA: Diagnosis not present

## 2013-11-20 ENCOUNTER — Ambulatory Visit: Payer: Medicare Other | Admitting: Physical Therapy

## 2013-11-20 DIAGNOSIS — R279 Unspecified lack of coordination: Secondary | ICD-10-CM | POA: Diagnosis not present

## 2013-11-20 DIAGNOSIS — M6281 Muscle weakness (generalized): Secondary | ICD-10-CM | POA: Diagnosis not present

## 2013-11-20 DIAGNOSIS — R262 Difficulty in walking, not elsewhere classified: Secondary | ICD-10-CM | POA: Diagnosis not present

## 2013-11-20 DIAGNOSIS — Z8673 Personal history of transient ischemic attack (TIA), and cerebral infarction without residual deficits: Secondary | ICD-10-CM | POA: Diagnosis not present

## 2013-11-20 DIAGNOSIS — IMO0001 Reserved for inherently not codable concepts without codable children: Secondary | ICD-10-CM | POA: Diagnosis not present

## 2013-11-23 ENCOUNTER — Inpatient Hospital Stay: Admission: RE | Admit: 2013-11-23 | Payer: Medicare Other | Source: Ambulatory Visit

## 2013-11-23 ENCOUNTER — Other Ambulatory Visit: Payer: Medicare Other

## 2013-11-24 ENCOUNTER — Ambulatory Visit: Payer: Medicare Other

## 2013-11-24 DIAGNOSIS — R279 Unspecified lack of coordination: Secondary | ICD-10-CM | POA: Diagnosis not present

## 2013-11-24 DIAGNOSIS — M6281 Muscle weakness (generalized): Secondary | ICD-10-CM | POA: Diagnosis not present

## 2013-11-24 DIAGNOSIS — R262 Difficulty in walking, not elsewhere classified: Secondary | ICD-10-CM | POA: Diagnosis not present

## 2013-11-24 DIAGNOSIS — Z8673 Personal history of transient ischemic attack (TIA), and cerebral infarction without residual deficits: Secondary | ICD-10-CM | POA: Diagnosis not present

## 2013-11-24 DIAGNOSIS — IMO0001 Reserved for inherently not codable concepts without codable children: Secondary | ICD-10-CM | POA: Diagnosis not present

## 2013-11-25 ENCOUNTER — Ambulatory Visit
Admission: RE | Admit: 2013-11-25 | Discharge: 2013-11-25 | Disposition: A | Discharge status: Home / Self Care | Payer: Medicare Other | Source: Ambulatory Visit | Attending: Neurology | Admitting: Neurology

## 2013-11-25 DIAGNOSIS — F0390 Unspecified dementia without behavioral disturbance: Secondary | ICD-10-CM

## 2013-11-25 DIAGNOSIS — R413 Other amnesia: Secondary | ICD-10-CM

## 2013-11-25 DIAGNOSIS — I6350 Cerebral infarction due to unspecified occlusion or stenosis of unspecified cerebral artery: Secondary | ICD-10-CM | POA: Diagnosis not present

## 2013-11-25 DIAGNOSIS — I6381 Other cerebral infarction due to occlusion or stenosis of small artery: Secondary | ICD-10-CM

## 2013-11-25 MED ORDER — IOHEXOL 300 MG/ML  SOLN
75.0000 mL | Freq: Once | INTRAMUSCULAR | Status: AC | PRN
  Administered 2013-11-25: 75 mL via INTRAVENOUS

## 2013-11-27 ENCOUNTER — Ambulatory Visit: Payer: Medicare Other

## 2013-11-30 ENCOUNTER — Ambulatory Visit: Payer: Medicare Other

## 2013-11-30 DIAGNOSIS — IMO0001 Reserved for inherently not codable concepts without codable children: Secondary | ICD-10-CM | POA: Diagnosis not present

## 2013-11-30 DIAGNOSIS — M6281 Muscle weakness (generalized): Secondary | ICD-10-CM | POA: Diagnosis not present

## 2013-11-30 DIAGNOSIS — Z8673 Personal history of transient ischemic attack (TIA), and cerebral infarction without residual deficits: Secondary | ICD-10-CM | POA: Diagnosis not present

## 2013-11-30 DIAGNOSIS — R262 Difficulty in walking, not elsewhere classified: Secondary | ICD-10-CM | POA: Diagnosis not present

## 2013-11-30 DIAGNOSIS — R279 Unspecified lack of coordination: Secondary | ICD-10-CM | POA: Diagnosis not present

## 2013-12-02 ENCOUNTER — Ambulatory Visit: Payer: Medicare Other

## 2013-12-02 DIAGNOSIS — M6281 Muscle weakness (generalized): Secondary | ICD-10-CM | POA: Diagnosis not present

## 2013-12-02 DIAGNOSIS — R279 Unspecified lack of coordination: Secondary | ICD-10-CM | POA: Diagnosis not present

## 2013-12-02 DIAGNOSIS — IMO0001 Reserved for inherently not codable concepts without codable children: Secondary | ICD-10-CM | POA: Diagnosis not present

## 2013-12-02 DIAGNOSIS — Z8673 Personal history of transient ischemic attack (TIA), and cerebral infarction without residual deficits: Secondary | ICD-10-CM | POA: Diagnosis not present

## 2013-12-02 DIAGNOSIS — R262 Difficulty in walking, not elsewhere classified: Secondary | ICD-10-CM | POA: Diagnosis not present

## 2013-12-07 ENCOUNTER — Ambulatory Visit: Payer: Medicare Other | Attending: Physical Medicine and Rehabilitation

## 2013-12-07 DIAGNOSIS — R279 Unspecified lack of coordination: Secondary | ICD-10-CM | POA: Diagnosis not present

## 2013-12-07 DIAGNOSIS — M6281 Muscle weakness (generalized): Secondary | ICD-10-CM | POA: Diagnosis not present

## 2013-12-07 DIAGNOSIS — R262 Difficulty in walking, not elsewhere classified: Secondary | ICD-10-CM | POA: Diagnosis not present

## 2013-12-07 DIAGNOSIS — IMO0001 Reserved for inherently not codable concepts without codable children: Secondary | ICD-10-CM | POA: Insufficient documentation

## 2013-12-07 DIAGNOSIS — Z8673 Personal history of transient ischemic attack (TIA), and cerebral infarction without residual deficits: Secondary | ICD-10-CM | POA: Insufficient documentation

## 2013-12-11 ENCOUNTER — Ambulatory Visit: Payer: Medicare Other

## 2013-12-11 DIAGNOSIS — IMO0001 Reserved for inherently not codable concepts without codable children: Secondary | ICD-10-CM | POA: Diagnosis not present

## 2013-12-21 ENCOUNTER — Telehealth: Payer: Self-pay | Admitting: Neurology

## 2013-12-21 DIAGNOSIS — F02818 Dementia in other diseases classified elsewhere, unspecified severity, with other behavioral disturbance: Secondary | ICD-10-CM | POA: Diagnosis not present

## 2013-12-21 DIAGNOSIS — I10 Essential (primary) hypertension: Secondary | ICD-10-CM | POA: Diagnosis not present

## 2013-12-21 DIAGNOSIS — F0281 Dementia in other diseases classified elsewhere with behavioral disturbance: Secondary | ICD-10-CM | POA: Diagnosis not present

## 2013-12-21 NOTE — Telephone Encounter (Signed)
Patient's spouse calling to gets results from [facility] and CT of the head.  Please return call

## 2013-12-22 NOTE — Telephone Encounter (Signed)
I called and LMVM for pts wife to return call for results.

## 2013-12-22 NOTE — Telephone Encounter (Signed)
Gave information to Dr. Clydene Fake nurse to call in abnormal results.

## 2013-12-22 NOTE — Telephone Encounter (Signed)
Wife returned call.  The CT Head and EEG results given to her.  She verbalized understanding.

## 2013-12-30 DIAGNOSIS — I69928 Other speech and language deficits following unspecified cerebrovascular disease: Secondary | ICD-10-CM | POA: Diagnosis not present

## 2013-12-30 DIAGNOSIS — E7800 Pure hypercholesterolemia, unspecified: Secondary | ICD-10-CM | POA: Diagnosis not present

## 2013-12-30 DIAGNOSIS — I10 Essential (primary) hypertension: Secondary | ICD-10-CM | POA: Diagnosis not present

## 2013-12-30 DIAGNOSIS — Z954 Presence of other heart-valve replacement: Secondary | ICD-10-CM | POA: Diagnosis not present

## 2013-12-30 DIAGNOSIS — F0281 Dementia in other diseases classified elsewhere with behavioral disturbance: Secondary | ICD-10-CM | POA: Diagnosis not present

## 2013-12-30 DIAGNOSIS — R4182 Altered mental status, unspecified: Secondary | ICD-10-CM | POA: Diagnosis not present

## 2013-12-30 DIAGNOSIS — F02818 Dementia in other diseases classified elsewhere, unspecified severity, with other behavioral disturbance: Secondary | ICD-10-CM | POA: Diagnosis not present

## 2013-12-31 DIAGNOSIS — D500 Iron deficiency anemia secondary to blood loss (chronic): Secondary | ICD-10-CM | POA: Diagnosis not present

## 2014-01-02 ENCOUNTER — Other Ambulatory Visit: Payer: Self-pay | Admitting: Physical Medicine and Rehabilitation

## 2014-01-04 DIAGNOSIS — C670 Malignant neoplasm of trigone of bladder: Secondary | ICD-10-CM | POA: Diagnosis not present

## 2014-01-07 DIAGNOSIS — D649 Anemia, unspecified: Secondary | ICD-10-CM | POA: Diagnosis not present

## 2014-02-03 DIAGNOSIS — M4120 Other idiopathic scoliosis, site unspecified: Secondary | ICD-10-CM | POA: Diagnosis not present

## 2014-02-08 ENCOUNTER — Encounter: Payer: Medicare Other | Attending: Physical Medicine and Rehabilitation | Admitting: Physical Medicine & Rehabilitation

## 2014-02-08 ENCOUNTER — Encounter: Payer: Self-pay | Admitting: Physical Medicine & Rehabilitation

## 2014-02-08 VITALS — BP 139/45 | HR 59 | Resp 14 | Wt 152.6 lb

## 2014-02-08 DIAGNOSIS — Z9181 History of falling: Secondary | ICD-10-CM | POA: Diagnosis not present

## 2014-02-08 DIAGNOSIS — R296 Repeated falls: Secondary | ICD-10-CM

## 2014-02-08 DIAGNOSIS — I6350 Cerebral infarction due to unspecified occlusion or stenosis of unspecified cerebral artery: Secondary | ICD-10-CM | POA: Diagnosis not present

## 2014-02-08 DIAGNOSIS — F0390 Unspecified dementia without behavioral disturbance: Secondary | ICD-10-CM | POA: Diagnosis not present

## 2014-02-08 DIAGNOSIS — M7061 Trochanteric bursitis, right hip: Secondary | ICD-10-CM

## 2014-02-08 DIAGNOSIS — M76899 Other specified enthesopathies of unspecified lower limb, excluding foot: Secondary | ICD-10-CM | POA: Diagnosis not present

## 2014-02-08 NOTE — Patient Instructions (Signed)

## 2014-02-08 NOTE — Progress Notes (Signed)
Subjective:    Patient ID: Bryan Moore, male    DOB: 09/16/1919, 80 y.o.   MRN: [id number]  HPI  Mr. Utsler is back after his left basal ganglia infarct. Overall he has done fairly nicely. He is having some back pain as well as right hip pain. The right hip bothers him over longer distances.   He fell about a month ago in the bathroom when reaching for a moving door. He only suffered some bruising. His wife had the door removed. He hasn't suffered any further falls since he's been home. He does tend to drag the right leg. He completed HH PT as well as outpt PT at [facility]  Memory remains an issue although it was an issue at baseline. He has short term and long term memory deficits.   He is eating well. Bowels and bladder habits have been stable.   For exercise he's walking daily with his wife. He is not doing his exercises regularly for balance.      Pain Inventory Average Pain 4 Pain Right Now 0 My pain is burning and stabbing  In the last 24 hours, has pain interfered with the following? General activity 5 Relation with others 0 Enjoyment of life 5 What TIME of day is your pain at its worst? daytime Sleep (in general) Fair  Pain is worse with: walking and some activites Pain improves with: rest Relief from Meds: no taking  Mobility walk with assistance use a cane use a walker ability to climb steps?  yes do you drive?  no  Function retired  Neuro/Psych numbness tingling trouble walking dizziness confusion  Prior Studies Any changes since last visit?  no  Physicians involved in your care Any changes since last visit?  no   No family history on file. History   Social History  . Marital Status: Married    Spouse Name: N/A    Number of Children: 2co  . Years of Education: college   Social History Main Topics  . Smoking status: Former Smoker    Quit date: 07/04/1981  . Smokeless tobacco: Never Used  . Alcohol Use: 0.6 oz/week    1  Glasses of wine per week     Comment: nightly  . Drug Use: No  . Sexual Activity: Not Currently   Other Topics Concern  . None   Social History Narrative   Patient lives at home with his wife          Past Surgical History  Procedure Laterality Date  . Appendectomy    . Cardiac catheterization  10/23/2010    ef 65%  . US echocardiography  08/31/10    EF 55-60%  . Cardiovascular stress test  01/01/2007    EF 64%, NO EVIDENCE OF ISCHEMIA  . Cardiac valve replacement      02/2011([facility])  . Transurethral resection of bladder tumor with gyrus (turbt-gyrus) N/A 04/13/2013    Procedure: TRANSURETHRAL RESECTION OF BLADDER TUMOR WITH GYRUS (TURBT-GYRUS);  Surgeon: [name], MD;  Location: [facility];  Service: Urology;  Laterality: N/A;  . Cystoscopy with stent placement Left 04/13/2013    Procedure:  LEFT URETERAL STENT PLACEMENT;  Surgeon: [name], MD;  Location: [facility];  Service: Urology;  Laterality: Left;  . Back surgery  2010 OR 2011  . Transurethral resection of bladder tumor N/A 06/04/2013    Procedure: RESTAGING TRANSURETHRAL RESECTION OF BLADDER TUMOR (TURBT), URETERAL CATHERIZATION ;  Surgeon: [name], MD;  Location: [facility];  Service: Urology;  Laterality: N/A;  CYSTO RESTAGING TURBT     . Eye surgery      bilateral cataracts with lens implants  . Cystoscopy w/ retrogrades Bilateral 09/17/2013    Procedure: CYSTOSCOPY WITH BILATERAL RETROGRADE;  Surgeon: [name], MD;  Location: [facility];  Service: Urology;  Laterality: Bilateral;  . Transurethral resection of bladder tumor N/A 09/17/2013    Procedure: TRANSURETHRAL RESECTION OF BLADDER TUMOR WITH MITOMYCIN-C;  Surgeon: [name], MD;  Location: [facility];  Service: Urology;  Laterality: N/A;   Past Medical History  Diagnosis Date  . Anemia     from AVM's. Requiring transfusion  . Hyperlipemia   . Aortic stenosis     s/p TAVI July 2012  . Glaucoma   . Mitral regurgitation   . Shingles May 2013    right arm  residual pain- last outbreak 1 1/2 yrs ago.  Marland Kitchen Hypertension     Not taking Lisinopril due to BP drops low.  . Transfusion history     last 2 yrs ago.  Marland Kitchen GERD (gastroesophageal reflux disease)     RARE - AND NO MEDS  . Cancer     BLADDER CANCER  . Arthritis   . Balance problem     WEAK ON RIGHT SIDE ( SCOLISOSIS AND HX OF A BACK SURGERY) - BALANCE PROBLEM - "FALLS BACKWARD" - FREQUENT FALLS.  USES CANE WHEN AMBULATING  . Complication of anesthesia     VERY DROWSY FOR HOURS AFTER SURGERY - AND DISORIENTATION  . Heart murmur     PT'S CARDIOLOGIST IS DR. Acie Fredrickson -LAST OFFICE VISIT 03/02/13 IN [facility]  . Dementia     more memory problems from stroke  . Stroke    BP 139/45  Pulse 59  Resp 14  Wt 152 lb 9.6 oz (69.219 kg)  SpO2 99%  Opioid Risk Score:   Fall Risk Score: High Fall Risk (>13 points) (previously educated and given handout for fallprevention in the home)  Review of Systems  Respiratory: Positive for shortness of breath.   Endocrine:       High blood sugars  Musculoskeletal: Positive for gait problem.  Neurological: Positive for dizziness and numbness.       Tingling  Psychiatric/Behavioral: Positive for confusion.  All other systems reviewed and are negative.      Objective:   Physical Exam  Constitutional:  NAD  HENT:  Head: Normocephalic and atraumatic.  Neck: No JVD present.  Cardiovascular: Normal rate and regular rhythm. Exam reveals no gallop and no friction rub.  Slight systolic murmur heard.  Respiratory: No respiratory distress. He has no wheezes. He has no rales. He exhibits no tenderness.  GI: He exhibits no distension and no mass. There is no tenderness. There is no rebound and no guarding.  Lymphadenopathy:  He has no cervical adenopathy.  Neurological:    Alert . Minimal insight and awareness.   Able to follow basic commands with redirection. Right pronator drift and decreased Davis  Strength grossly 4+ to 5/5 RUE, 4+ to 5/5 LUE, RLE 4+HF to 5/5 at  ankle. LLE is 5/5 throughout. He tends to drag the right leg occasionally when he walks. He did not stub his toe however. He needed extra time to change directions.  Musc: right greater troch painful with palpation and cross-leg manuever  Psychiatric:  Flat.          Assessment & Plan:  Left basal ganglia infarct  -therapy has  completed but he remains at a fall risk.   -will make a referral to aquatic therapies which I believe can help his dynamic balance more -I also discussed with his wife other potential variables including, sleep, shoe wear, home environment, pain, etc.  -he should definitely have his cane with him when he ambulates. I almost feel he needs a quad cane.  2. Right greater trochanter bursitis  -exercises were provided  -ice therapy 3. Mood: Dementia at baseline.  4. Anemia: Multifactorial--follow up per PCP.   Follow up with me PRN.  30 minutes of face to face patient care time were spent during this visit. All questions were encouraged and answered.

## 2014-02-10 DIAGNOSIS — R269 Unspecified abnormalities of gait and mobility: Secondary | ICD-10-CM | POA: Diagnosis not present

## 2014-02-10 DIAGNOSIS — M6281 Muscle weakness (generalized): Secondary | ICD-10-CM | POA: Diagnosis not present

## 2014-02-10 DIAGNOSIS — R4182 Altered mental status, unspecified: Secondary | ICD-10-CM | POA: Diagnosis not present

## 2014-02-11 DIAGNOSIS — M6281 Muscle weakness (generalized): Secondary | ICD-10-CM | POA: Diagnosis not present

## 2014-02-11 DIAGNOSIS — R4182 Altered mental status, unspecified: Secondary | ICD-10-CM | POA: Diagnosis not present

## 2014-02-11 DIAGNOSIS — R269 Unspecified abnormalities of gait and mobility: Secondary | ICD-10-CM | POA: Diagnosis not present

## 2014-02-16 ENCOUNTER — Ambulatory Visit: Payer: Medicare Other | Admitting: Neurology

## 2014-02-17 DIAGNOSIS — R4182 Altered mental status, unspecified: Secondary | ICD-10-CM | POA: Diagnosis not present

## 2014-02-17 DIAGNOSIS — M6281 Muscle weakness (generalized): Secondary | ICD-10-CM | POA: Diagnosis not present

## 2014-02-17 DIAGNOSIS — R269 Unspecified abnormalities of gait and mobility: Secondary | ICD-10-CM | POA: Diagnosis not present

## 2014-02-18 DIAGNOSIS — R269 Unspecified abnormalities of gait and mobility: Secondary | ICD-10-CM | POA: Diagnosis not present

## 2014-02-18 DIAGNOSIS — M6281 Muscle weakness (generalized): Secondary | ICD-10-CM | POA: Diagnosis not present

## 2014-02-18 DIAGNOSIS — R4182 Altered mental status, unspecified: Secondary | ICD-10-CM | POA: Diagnosis not present

## 2014-02-22 DIAGNOSIS — H4011X0 Primary open-angle glaucoma, stage unspecified: Secondary | ICD-10-CM | POA: Diagnosis not present

## 2014-02-22 DIAGNOSIS — R269 Unspecified abnormalities of gait and mobility: Secondary | ICD-10-CM | POA: Diagnosis not present

## 2014-02-22 DIAGNOSIS — R4182 Altered mental status, unspecified: Secondary | ICD-10-CM | POA: Diagnosis not present

## 2014-02-22 DIAGNOSIS — M6281 Muscle weakness (generalized): Secondary | ICD-10-CM | POA: Diagnosis not present

## 2014-02-23 DIAGNOSIS — F0281 Dementia in other diseases classified elsewhere with behavioral disturbance: Secondary | ICD-10-CM | POA: Diagnosis not present

## 2014-02-23 DIAGNOSIS — F02818 Dementia in other diseases classified elsewhere, unspecified severity, with other behavioral disturbance: Secondary | ICD-10-CM | POA: Diagnosis not present

## 2014-02-23 DIAGNOSIS — I10 Essential (primary) hypertension: Secondary | ICD-10-CM | POA: Diagnosis not present

## 2014-02-24 ENCOUNTER — Encounter: Payer: Self-pay | Admitting: Neurology

## 2014-02-24 ENCOUNTER — Ambulatory Visit (INDEPENDENT_AMBULATORY_CARE_PROVIDER_SITE_OTHER): Payer: Medicare Other | Admitting: Neurology

## 2014-02-24 VITALS — BP 134/63 | HR 56 | Wt 151.0 lb

## 2014-02-24 DIAGNOSIS — F0280 Dementia in other diseases classified elsewhere without behavioral disturbance: Secondary | ICD-10-CM

## 2014-02-24 DIAGNOSIS — G309 Alzheimer's disease, unspecified: Principal | ICD-10-CM

## 2014-02-24 DIAGNOSIS — F0390 Unspecified dementia without behavioral disturbance: Secondary | ICD-10-CM

## 2014-02-24 DIAGNOSIS — I6350 Cerebral infarction due to unspecified occlusion or stenosis of unspecified cerebral artery: Secondary | ICD-10-CM

## 2014-02-24 MED ORDER — DONEPEZIL HCL 5 MG PO TABS: 5.0000 mg | ORAL_TABLET | Freq: Every day | ORAL | Status: DC

## 2014-02-24 NOTE — Progress Notes (Signed)
[facility] [address] [place]. [place] [address] [phone]       OFFICE FOLLOW-UP NOTE  Bryan. Bryan Moore Date of Birth:  09/15/1919 Medical Record Number:  [id number]   HPI: Bryan Moore is a 45 year Caucasian male who is seen today for followup after hospital admission for stroke in January 2015. He was admitted with 3 day history of worsening confusion and some speech and balance difficulties with right-sided weakness. CT scan of the head showed a subacute left subcortical lacunar infarct and subsequent MRI scan confirmed this to be acute to subacute. MRI of the brain revealed no large vessel stenosis. Transthoracic echo showed slightly reduced ejection fraction of 4550% with diffuse hypokinesis but no definite clot. Hemoglobin A1c was 5.2. Lipid profile was unremarkable. Patient had a history of GI bleed in the recent past on aspirin and was found to have GI tract AVMs and hence was not started on aspirin. The patient's wife states that since discharge he has had significant memory and cognitive impairment and is no longer able to handle his own affairs. On inquiry she does admit that he may have had some mild memory problem before but they were not as bad. Patient gets confused and disoriented easily. He has poor short-term memory. Hemostasis objects and cannot find them. She has had to help him increasingly more and more with his daily routine. The patient underwent surgery for bladder cancer in mid February which went well and he has just finished 6 weeks of bladder wash. He has not been evaluated for treatable causes of dementia. He has no history of seizures significant head injury with loss of consciousness no family history of dementia. Update 02/24/2014 ; he returns for followup after his last visit 11/12/13. He is a complaint by his wife who states that she thinks he is cognitively doing about the same. She continues to have short-term memory difficulties and  disorientation and confusion. He needs constant supervision. He is independent with activities like changing close, using contact and feeding himself but reached we washed all the time. He went out with a friend a few days ago and told him that he had moved and he and his friend were driving around the neighborhood as he was unable to tell him the psychiatrist. Patient has had no issues with agitation or violent behavior. He walks independently but has had a few occasional falls. He has not had any delusions or hallucinations. She had some diagnostic imaging lab work done which had ordered at the last visit and I reviewed the results with patient and his wife. CT scan of the head on 11/25/13 personally reviewed by me shows the mortise left basal ganglia infarct and mild changes of chronic micro-vascular ischemia and generalized cerebral atrophy. There labs dated 11/17/13 showed normal vitamin B12, TSH and RPR. EEG done and 11/17/13 is normal without epileptiform activity. On the Mini-Mental status exam today he scored 19/30 and we'll naming test 7 and Clock Brian 2/4 this is slightly better than last visit. ROS:   14 system review of systems is positive for memory loss, fatigue, murmur, incontinence, feeling cold, anemia, confusion and dizziness and all other systems negative PMH:  Past Medical History  Diagnosis Date  . Anemia     from AVM's. Requiring transfusion  . Hyperlipemia   . Aortic stenosis     s/p TAVI July 2012  . Glaucoma   . Mitral regurgitation   . Shingles May 2013  right arm residual pain- last outbreak 1 1/2 yrs ago.  Marland Kitchen Hypertension     Not taking Lisinopril due to BP drops low.  . Transfusion history     last 2 yrs ago.  Marland Kitchen GERD (gastroesophageal reflux disease)     RARE - AND NO MEDS  . Cancer     BLADDER CANCER  . Arthritis   . Balance problem     WEAK ON RIGHT SIDE ( SCOLISOSIS AND HX OF A BACK SURGERY) - BALANCE PROBLEM - "FALLS BACKWARD" - FREQUENT FALLS.  USES CANE  WHEN AMBULATING  . Complication of anesthesia     VERY DROWSY FOR HOURS AFTER SURGERY - AND DISORIENTATION  . Heart murmur     PT'S CARDIOLOGIST IS DR. Acie Fredrickson -LAST OFFICE VISIT 03/02/13 IN [facility]  . Dementia     more memory problems from stroke  . Stroke     Social History:  History   Social History  . Marital Status: Married    Spouse Name: N/A    Number of Children: 2co  . Years of Education: college   Occupational History  . Not on file.   Social History Main Topics  . Smoking status: Former Smoker    Quit date: 07/04/1981  . Smokeless tobacco: Never Used  . Alcohol Use: 0.6 oz/week    1 Glasses of wine per week     Comment: nightly  . Drug Use: No  . Sexual Activity: Not Currently   Other Topics Concern  . Not on file   Social History Narrative   Patient lives at home with his wife           Medications:   Current Outpatient Prescriptions on File Prior to Visit  Medication Sig Dispense Refill  . acetaminophen (TYLENOL) 325 MG tablet Take 650 mg by mouth every 6 (six) hours as needed for moderate pain.       Marland Kitchen amLODipine (NORVASC) 5 MG tablet Take 1 tablet (5 mg total) by mouth daily.  30 tablet  1  . atorvastatin (LIPITOR) 10 MG tablet Take 1 tablet (10 mg total) by mouth daily at 6 PM.  30 tablet  1  . brimonidine (ALPHAGAN P) 0.1 % SOLN Place 1 drop into both eyes 2 (two) times daily.       . cholecalciferol (VITAMIN D) 1000 UNITS tablet Take 1,000 Units by mouth every morning.       . dorzolamide-timolol (COSOPT) 22.3-6.8 MG/ML ophthalmic solution Place 1 drop into both eyes every 12 (twelve) hours.       . iron polysaccharides (NIFEREX) 150 MG capsule Take 150 mg by mouth every morning.       . latanoprost (XALATAN) 0.005 % ophthalmic solution Place 1 drop into both eyes at bedtime.       . Multiple Vitamin (MULTIVITAMIN WITH MINERALS) TABS tablet Take 1 tablet by mouth every morning.       . traMADol (ULTRAM) 50 MG tablet Take 1 tablet (50 mg total) by  mouth every 6 (six) hours as needed.  12 tablet  1  . vitamin B-12 (CYANOCOBALAMIN) 250 MCG tablet Take 250 mcg by mouth every morning.        No current facility-administered medications on file prior to visit.    Allergies:   Allergies  Allergen Reactions  . Advil [Ibuprofen] Other (See Comments)    Causes stomach bleeding, need transfusions as result  . Aspirin Other (See Comments)    Causes stomach bleeding, needs  transfusion as result  . Penicillins Itching and Swelling  . Ativan [Lorazepam] Anxiety    He became very anxious and aggitated    Physical Exam General: Pleasant elderly Caucasian male seated, in no evident distress Head: head normocephalic and atraumatic. Orohparynx benign Neck: supple with no carotid or supraclavicular bruits Cardiovascular: regular rate and rhythm, no murmurs Musculoskeletal: no deformity Skin:  no rash/petichiae Vascular:  Normal pulses all extremities Filed Vitals:   02/24/14 1111  BP: 134/63  Pulse: 56   Neurologic Exam Mental Status: Awake and fully alert. Disoriented to place and time. Recent and remote memory poort. Attention span, concentration and fund of knowledge diminished.MMSE 19/30. AFT 7 only.  Clock Drawing 2/4.Marland Kitchen Mood and affect appropriate.  Cranial Nerves: Fundoscopic exam not done  . Pupils equal, briskly reactive to light. Extraocular movements full without nystagmus. Visual fields full to confrontation. Hearing intact. Facial sensation intact. Face, tongue, palate moves normally and symmetrically.  Motor: Normal bulk and tone. Normal strength in all tested extremity muscles. Diminished fine finger movements on the right. The minimum right grip weakness. Orbits left over right upper extremity.  Sensory.: intact to touch and pinprick and vibratory sensation sensation.  Coordination: Rapid alternating movements normal in all extremities. Finger-to-nose and heel-to-shin performed accurately bilaterally. Gait and Station: Arises  from chair without difficulty. Stance is slightly stooped. Gait demonstrates normal stride length and balance . Unable to heel, toe and tandem walk without difficulty.  Reflexes: 1+ and symmetric. Toes downgoing.       ASSESSMENT: 35 Caucasian male with suspect prior mild cognitive impairment which has now converted to mild dementia following left subcortical infarctin January 2015 He has not been placed on aspirin due to history of recurrent GI bleeds from AVMs on aspirin in the past .   PLAN: I had a long discussion with the patient and his wife regarding his  dementia workup being negative and fee that he has Alzheimer's which has been triggered by recent stroke but is likely the underlying cause of his dementia. After some convincing the patient's wife is willing to try Aricept. Begin 5 mg daily for 4 weeks and if tolerated increase to 10 mg daily. Patient was advised about fall precautions. I also encouraged the patient's wife to have him wear a name/address badge or bracelet I gave the patient's wife dementia caregivers survival kit to review.. Return for followup in 2 months or call earlier if necessary.  Note: This document was prepared with digital dictation and possible smart phrase technology. Any transcriptional errors that result from this process are unintentional

## 2014-02-24 NOTE — Patient Instructions (Addendum)
I had a long discussion with the patient and his wife regarding his  dementia workup being negative and fee that he has Alzheimer's which has been triggered by recent stroke but is likely the underlying cause of his dementia. After some convincing the patient's wife is willing to try Aricept. Begin 5 mg daily for 4 weeks and if tolerated increase to 10 mg daily. Patient was advised about fall precautions. I also encouraged the patient's wife to have him wear a name/address badge or bracelet. I gave the patient's wife dementia caregivers survival kit to review. Return for followup in 2 months or call earlier if necessary.  Dementia Dementia is a general term for problems with brain function. A person with dementia has memory loss and a hard time with at least one other brain function such as thinking, speaking, or problem solving. Dementia can affect social functioning, how you do your job, your mood, or your personality. The changes may be hidden for a long time. The earliest forms of this disease are usually not detected by family or friends. Dementia can be:  Irreversible.  Potentially reversible.  Partially reversible.  Progressive. This means it can get worse over time. CAUSES  Irreversible dementia causes may include:  Degeneration of brain cells (Alzheimer's disease or lewy body dementia).  Multiple small strokes (vascular dementia).  Infection (chronic meningitis or Creutzfelt-Jakob disease).  Frontotemporal dementia. This affects younger people, age 60 to 92, compared to those who have Alzheimer's disease.  Dementia associated with other disorders like Parkinson's disease, Huntington's disease, or HIV-associated dementia. Potentially or partially reversible dementia causes may include:  Medicines.  Metabolic causes such as excessive alcohol intake, vitamin B12 deficiency, or thyroid disease.  Masses or pressure in the brain such as a tumor, blood clot, or  hydrocephalus. SYMPTOMS  Symptoms are often hard to detect. Family members or coworkers may not notice them early in the disease process. Different people with dementia may have different symptoms. Symptoms can include:  A hard time with memory, especially recent memory. Long-term memory may not be impaired.  Asking the same question multiple times or forgetting something someone just said.  A hard time speaking your thoughts or finding certain words.  A hard time solving problems or performing familiar tasks (such as how to use a telephone).  Sudden changes in mood.  Changes in personality, especially increasing moodiness or mistrust.  Depression.  A hard time understanding complex ideas that were never a problem in the past. DIAGNOSIS  There are no specific tests for dementia.   Your caregiver may recommend a thorough evaluation. This is because some forms of dementia can be reversible. The evaluation will likely include a physical exam and getting a detailed history from you and a family member. The history often gives the best clues and suggestions for a diagnosis.  Memory testing may be done. A detailed brain function evaluation called neuropsychologic testing may be helpful.  Lab tests and brain imaging (such as a CT scan or MRI scan) are sometimes important.  Sometimes observation and re-evaluation over time is very helpful. TREATMENT  Treatment depends on the cause.   If the problem is a vitamin deficiency, it may be helped or cured with supplements.  For dementias such as Alzheimer's disease, medicines are available to stabilize or slow the course of the disease. There are no cures for this type of dementia.  Your caregiver can help direct you to groups, organizations, and other caregivers to help with decisions in  the care of you or your loved one. HOME CARE INSTRUCTIONS The care of individuals with dementia is varied and dependent upon the progression of the dementia.  The following suggestions are intended for the person living with, or caring for, the person with dementia.  Create a safe environment.  Remove the locks on bathroom doors to prevent the person from accidentally locking himself or herself in.  Use childproof latches on kitchen cabinets and any place where cleaning supplies, chemicals, or alcohol are kept.  Use childproof covers in unused electrical outlets.  Install childproof devices to keep doors and windows secured.  Remove stove knobs or install safety knobs and an automatic shut-off on the stove.  Lower the temperature on water heaters.  Label medicines and keep them locked up.  Secure knives, lighters, matches, power tools, and guns, and keep these items out of reach.  Keep the house free from clutter. Remove rugs or anything that might contribute to a fall.  Remove objects that might break and hurt the person.  Make sure lighting is good, both inside and outside.  Install grab rails as needed.  Use a monitoring device to alert you to falls or other needs for help.  Reduce confusion.  Keep familiar objects and people around.  Use night lights or dim lights at night.  Label items or areas.  Use reminders, notes, or directions for daily activities or tasks.  Keep a simple, consistent routine for waking, meals, bathing, dressing, and bedtime.  Create a calm, quiet environment.  Place large clocks and calendars prominently.  Display emergency numbers and home address near all telephones.  Use cues to establish different times of the day. An example is to open curtains to let the natural light in during the day.   Use effective communication.  Choose simple words and short sentences.  Use a gentle, calm tone of voice.  Be careful not to interrupt.  If the person is struggling to find a word or communicate a thought, try to provide the word or thought.  Ask one question at a time. Allow the person ample  time to answer questions. Repeat the question again if the person does not respond.  Reduce nighttime restlessness.  Provide a comfortable bed.  Have a consistent nighttime routine.  Ensure a regular walking or physical activity schedule. Involve the person in daily activities as much as possible.  Limit napping during the day.  Limit caffeine.  Attend social events that stimulate rather than overwhelm the senses.  Encourage good nutrition and hydration.  Reduce distractions during meal times and snacks.  Avoid foods that are too hot or too cold.  Monitor chewing and swallowing ability.  Continue with routine vision, hearing, dental, and medical screenings.  Only give over-the-counter or prescription medicines as directed by the caregiver.  Monitor driving abilities. Do not allow the person to drive when safe driving is no longer possible.  Register with an identification program which could provide location assistance in the event of a missing person situation. SEEK MEDICAL CARE IF:   New behavioral problems start such as moodiness, aggressiveness, or seeing things that are not there (hallucinations).  Any new problem with brain function happens. This includes problems with balance, speech, or falling a lot.  Problems with swallowing develop.  Any symptoms of other illness happen. Small changes or worsening in any aspect of brain function can be a sign that the illness is getting worse. It can also be a sign of another  medical illness such as infection. Seeing a caregiver right away is important. SEEK IMMEDIATE MEDICAL CARE IF:   A fever develops.  New or worsened confusion develops.  New or worsened sleepiness develops.  Staying awake becomes hard to do. Document Released: 01/16/2001 Document Revised: 10/15/2011 Document Reviewed: 12/18/2010 [facility] Patient Information 2015 [place], [place]. This information is not intended to replace advice given to you by your  health care provider. Make sure you discuss any questions you have with your health care provider.

## 2014-02-25 DIAGNOSIS — M6281 Muscle weakness (generalized): Secondary | ICD-10-CM | POA: Diagnosis not present

## 2014-02-25 DIAGNOSIS — R269 Unspecified abnormalities of gait and mobility: Secondary | ICD-10-CM | POA: Diagnosis not present

## 2014-02-25 DIAGNOSIS — R4182 Altered mental status, unspecified: Secondary | ICD-10-CM | POA: Diagnosis not present

## 2014-02-26 DIAGNOSIS — R4182 Altered mental status, unspecified: Secondary | ICD-10-CM | POA: Diagnosis not present

## 2014-02-26 DIAGNOSIS — I10 Essential (primary) hypertension: Secondary | ICD-10-CM | POA: Diagnosis not present

## 2014-02-26 DIAGNOSIS — I69928 Other speech and language deficits following unspecified cerebrovascular disease: Secondary | ICD-10-CM | POA: Diagnosis not present

## 2014-02-26 DIAGNOSIS — Z954 Presence of other heart-valve replacement: Secondary | ICD-10-CM | POA: Diagnosis not present

## 2014-02-26 DIAGNOSIS — F0281 Dementia in other diseases classified elsewhere with behavioral disturbance: Secondary | ICD-10-CM | POA: Diagnosis not present

## 2014-02-26 DIAGNOSIS — F02818 Dementia in other diseases classified elsewhere, unspecified severity, with other behavioral disturbance: Secondary | ICD-10-CM | POA: Diagnosis not present

## 2014-02-26 DIAGNOSIS — E7800 Pure hypercholesterolemia, unspecified: Secondary | ICD-10-CM | POA: Diagnosis not present

## 2014-03-01 DIAGNOSIS — M6281 Muscle weakness (generalized): Secondary | ICD-10-CM | POA: Diagnosis not present

## 2014-03-01 DIAGNOSIS — R4182 Altered mental status, unspecified: Secondary | ICD-10-CM | POA: Diagnosis not present

## 2014-03-01 DIAGNOSIS — R269 Unspecified abnormalities of gait and mobility: Secondary | ICD-10-CM | POA: Diagnosis not present

## 2014-03-03 DIAGNOSIS — M6281 Muscle weakness (generalized): Secondary | ICD-10-CM | POA: Diagnosis not present

## 2014-03-03 DIAGNOSIS — R269 Unspecified abnormalities of gait and mobility: Secondary | ICD-10-CM | POA: Diagnosis not present

## 2014-03-03 DIAGNOSIS — R4182 Altered mental status, unspecified: Secondary | ICD-10-CM | POA: Diagnosis not present

## 2014-03-08 DIAGNOSIS — M6281 Muscle weakness (generalized): Secondary | ICD-10-CM | POA: Diagnosis not present

## 2014-03-08 DIAGNOSIS — R4182 Altered mental status, unspecified: Secondary | ICD-10-CM | POA: Diagnosis not present

## 2014-03-08 DIAGNOSIS — R269 Unspecified abnormalities of gait and mobility: Secondary | ICD-10-CM | POA: Diagnosis not present

## 2014-03-10 DIAGNOSIS — R4182 Altered mental status, unspecified: Secondary | ICD-10-CM | POA: Diagnosis not present

## 2014-03-10 DIAGNOSIS — M6281 Muscle weakness (generalized): Secondary | ICD-10-CM | POA: Diagnosis not present

## 2014-03-10 DIAGNOSIS — R269 Unspecified abnormalities of gait and mobility: Secondary | ICD-10-CM | POA: Diagnosis not present

## 2014-04-07 DIAGNOSIS — Z8551 Personal history of malignant neoplasm of bladder: Secondary | ICD-10-CM | POA: Diagnosis not present

## 2014-05-09 DIAGNOSIS — Z23 Encounter for immunization: Secondary | ICD-10-CM | POA: Diagnosis not present

## 2014-05-21 ENCOUNTER — Ambulatory Visit: Payer: Self-pay | Admitting: Neurology

## 2014-05-27 DIAGNOSIS — I10 Essential (primary) hypertension: Secondary | ICD-10-CM | POA: Diagnosis not present

## 2014-05-27 DIAGNOSIS — F0281 Dementia in other diseases classified elsewhere with behavioral disturbance: Secondary | ICD-10-CM | POA: Diagnosis not present

## 2014-06-02 DIAGNOSIS — Z952 Presence of prosthetic heart valve: Secondary | ICD-10-CM | POA: Diagnosis not present

## 2014-06-02 DIAGNOSIS — F0281 Dementia in other diseases classified elsewhere with behavioral disturbance: Secondary | ICD-10-CM | POA: Diagnosis not present

## 2014-06-02 DIAGNOSIS — I69928 Other speech and language deficits following unspecified cerebrovascular disease: Secondary | ICD-10-CM | POA: Diagnosis not present

## 2014-06-02 DIAGNOSIS — E780 Pure hypercholesterolemia: Secondary | ICD-10-CM | POA: Diagnosis not present

## 2014-06-02 DIAGNOSIS — I10 Essential (primary) hypertension: Secondary | ICD-10-CM | POA: Diagnosis not present

## 2014-06-15 ENCOUNTER — Ambulatory Visit: Payer: Medicare Other | Admitting: Neurology

## 2014-06-16 ENCOUNTER — Ambulatory Visit (INDEPENDENT_AMBULATORY_CARE_PROVIDER_SITE_OTHER): Payer: Medicare Other | Admitting: Neurology

## 2014-06-16 ENCOUNTER — Encounter: Payer: Self-pay | Admitting: Neurology

## 2014-06-16 VITALS — BP 140/66 | HR 56 | Ht 63.5 in | Wt 153.0 lb

## 2014-06-16 DIAGNOSIS — F03918 Unspecified dementia, unspecified severity, with other behavioral disturbance: Secondary | ICD-10-CM

## 2014-06-16 DIAGNOSIS — I639 Cerebral infarction, unspecified: Secondary | ICD-10-CM | POA: Diagnosis not present

## 2014-06-16 DIAGNOSIS — F0391 Unspecified dementia with behavioral disturbance: Secondary | ICD-10-CM | POA: Diagnosis not present

## 2014-06-16 MED ORDER — MEMANTINE HCL ER 7 & 14 & 21 &28 MG PO CP24: 7.0000 mg | ORAL_CAPSULE | Freq: Every morning | ORAL | Status: DC

## 2014-06-16 MED ORDER — MEMANTINE HCL ER 28 MG PO CP24: 28.0000 mg | ORAL_CAPSULE | Freq: Every morning | ORAL | Status: DC

## 2014-06-16 NOTE — Patient Instructions (Signed)
I had a long discussion with the patient and his wife regarding his dementia and unfortunately he was unable to tolerate aricept but I  recommend a trial of Namenda XR. We will begin with a starter pack and if tolerated take Namenda XR 28 mg daily. I have given him a prescription and discussed possible side effects with the patient and his wife and answered questions. Return for follow-up in 2 months or call earlier if necessary

## 2014-06-16 NOTE — Progress Notes (Signed)
[facility] [address] [place]. [place] [address] [phone]       OFFICE FOLLOW-UP NOTE  Bryan. Bryan Moore Date of Birth:  [birth year] Medical Record Number:  [id number]   HPI: Bryan Moore is a 63 year Caucasian male who is seen today for followup after hospital admission for stroke in January 2015. He was admitted with 3 day history of worsening confusion and some speech and balance difficulties with right-sided weakness. CT scan of the head showed a subacute left subcortical lacunar infarct and subsequent MRI scan confirmed this to be acute to subacute. MRI of the brain revealed no large vessel stenosis. Transthoracic echo showed slightly reduced ejection fraction of 4550% with diffuse hypokinesis but no definite clot. Hemoglobin A1c was 5.2. Lipid profile was unremarkable. Patient had a history of GI bleed in the recent past on aspirin and was found to have GI tract AVMs and hence was not started on aspirin. The patient's wife states that since discharge he has had significant memory and cognitive impairment and is no longer able to handle his own affairs. On inquiry she does admit that he may have had some mild memory problem before but they were not as bad. Patient gets confused and disoriented easily. He has poor short-term memory. Hemostasis objects and cannot find them. She has had to help him increasingly more and more with his daily routine. The patient underwent surgery for bladder cancer in mid February which went well and he has just finished 6 weeks of bladder wash. He has not been evaluated for treatable causes of dementia. He has no history of seizures significant head injury with loss of consciousness no family history of dementia. Update 02/24/2014 ; he returns for followup after his last visit 11/12/13. He is a complaint by his wife who states that she thinks he is cognitively doing about the same. She continues to have short-term memory difficulties and  disorientation and confusion. He needs constant supervision. He is independent with activities like changing close, using contact and feeding himself but reached we washed all the time. He went out with a friend a few days ago and told him that he had moved and he and his friend were driving around the neighborhood as he was unable to tell him the psychiatrist. Patient has had no issues with agitation or violent behavior. He walks independently but has had a few occasional falls. He has not had any delusions or hallucinations. She had some diagnostic imaging lab work done which had ordered at the last visit and I reviewed the results with patient and his wife. CT scan of the head on 11/25/13 personally reviewed by me shows the remote left basal ganglia infarct and mild changes of chronic micro-vascular ischemia and generalized cerebral atrophy. There labs dated 11/17/13 showed normal vitamin B12, TSH and RPR. EEG done and 11/17/13 is normal without epileptiform activity. On the Mini-Mental status exam today he scored 19/30 and we'll naming test 7 and Clock Drawing 2/4 this is slightly better than last visit. Update 06/16/2014 : He returns for follow-up after last visit 3 months ago. He did unfortunately did not tolerate Aricept and had to discontinue it after 1-2 weeks due to severe diarrhea. He has continued to have significant memory and cognitive difficulties which may be getting slightly worse. He requires 24-hour supervision. He has occasional incontinence. He has some trouble in gait initiation but is usually careful and has not had frequent falls. He does not have any delusions  iris [place] though he does have some fixed ideation from time to time. Does not get agitated or violent. The wife is not concerned about any unsafe behaviors. ROS:   14 system review of systems is positive for memory loss, cold intolerance, restless legs, muscle cramps, walking difficulty, bladder urgency, dizziness and confusion and  all other systems negative PMH:  Past Medical History  Diagnosis Date  . Anemia     from AVM's. Requiring transfusion  . Hyperlipemia   . Aortic stenosis     s/p TAVI July 2012  . Glaucoma   . Mitral regurgitation   . Shingles May 2013    right arm residual pain- last outbreak 1 1/2 yrs ago.  Marland Kitchen Hypertension     Not taking Lisinopril due to BP drops low.  . Transfusion history     last 2 yrs ago.  Marland Kitchen GERD (gastroesophageal reflux disease)     RARE - AND NO MEDS  . Cancer     BLADDER CANCER  . Arthritis   . Balance problem     WEAK ON RIGHT SIDE ( SCOLISOSIS AND HX OF A BACK SURGERY) - BALANCE PROBLEM - "FALLS BACKWARD" - FREQUENT FALLS.  USES CANE WHEN AMBULATING  . Complication of anesthesia     VERY DROWSY FOR HOURS AFTER SURGERY - AND DISORIENTATION  . Heart murmur     PT'S CARDIOLOGIST IS DR. Acie Fredrickson -LAST OFFICE VISIT 03/02/13 IN [facility]  . Dementia     more memory problems from stroke  . Stroke     Social History:  History   Social History  . Marital Status: Married    Spouse Name: N/A    Number of Children: 2co  . Years of Education: college   Occupational History  . Not on file.   Social History Main Topics  . Smoking status: Former Smoker    Quit date: 07/04/1981  . Smokeless tobacco: Never Used  . Alcohol Use: 0.6 oz/week    1 Glasses of wine per week     Comment: nightly  . Drug Use: No  . Sexual Activity: Not Currently   Other Topics Concern  . Not on file   Social History Narrative   Patient lives at home with his wife has 2 children.   Patient is left handed.   Patient has college education.   Patient drink 4 cups daily.          Medications:   Current Outpatient Prescriptions on File Prior to Visit  Medication Sig Dispense Refill  . acetaminophen (TYLENOL) 325 MG tablet Take 650 mg by mouth every 6 (six) hours as needed for moderate pain.     Marland Kitchen amLODipine (NORVASC) 5 MG tablet Take 1 tablet (5 mg total) by mouth daily. 30 tablet 1  .  atorvastatin (LIPITOR) 10 MG tablet Take 1 tablet (10 mg total) by mouth daily at 6 PM. 30 tablet 1  . cholecalciferol (VITAMIN D) 1000 UNITS tablet Take 1,000 Units by mouth every morning.     . dorzolamide-timolol (COSOPT) 22.3-6.8 MG/ML ophthalmic solution Place 1 drop into both eyes every 12 (twelve) hours.     . iron polysaccharides (NIFEREX) 150 MG capsule Take 150 mg by mouth every morning.     . latanoprost (XALATAN) 0.005 % ophthalmic solution Place 1 drop into both eyes at bedtime.     . Multiple Vitamin (MULTIVITAMIN WITH MINERALS) TABS tablet Take 1 tablet by mouth every morning.     . vitamin B-12 (  CYANOCOBALAMIN) 250 MCG tablet Take 250 mcg by mouth every morning.     . brimonidine (ALPHAGAN P) 0.1 % SOLN Place 1 drop into both eyes 2 (two) times daily.     Marland Kitchen donepezil (ARICEPT) 5 MG tablet Take 1 tablet (5 mg total) by mouth at bedtime. Take 1 tablet daily x 4 weeks then two tablets daily 30 tablet 2   No current facility-administered medications on file prior to visit.    Allergies:   Allergies  Allergen Reactions  . Advil [Ibuprofen] Other (See Comments)    Causes stomach bleeding, need transfusions as result  . Aspirin Other (See Comments)    Causes stomach bleeding, needs transfusion as result  . Penicillins Itching and Swelling  . Ativan [Lorazepam] Anxiety    He became very anxious and aggitated    Physical Exam General: Pleasant elderly Caucasian male seated, in no evident distress Head: head normocephalic and atraumatic.    Neck: supple with no carotid or supraclavicular bruits Cardiovascular: regular rate and rhythm, no murmurs Musculoskeletal: no deformity Skin:  no rash/petichiae Vascular:  Normal pulses all extremities Filed Vitals:   06/16/14 1014  BP: 140/66  Pulse: 56   Neurologic Exam Mental Status: Awake and fully alert. Disoriented to place and time. Recent and remote memory poort. Attention span, concentration and fund of knowledge  diminished.MMSE 16/30. AFT 2 only.  Clock Drawing 1/4.Marland Kitchen Mood and affect appropriate. Geriatric depression scale to only Cranial Nerves: Fundoscopic exam not done  . Pupils equal, briskly reactive to light. Extraocular movements full without nystagmus. Visual fields full to confrontation. Hearing intact. Facial sensation intact. Face, tongue, palate moves normally and symmetrically.  Motor: Normal bulk and tone. Normal strength in all tested extremity muscles. Diminished fine finger movements on the right. The minimum right grip weakness. Orbits left over right upper extremity.  Sensory.: intact to touch and pinprick and vibratory sensation sensation.  Coordination: Rapid alternating movements normal in all extremities. Finger-to-nose and heel-to-shin performed accurately bilaterally. Gait and Station: Arises from chair with mild initiation of proximal apraxia. Stance is slightly stooped. Gait demonstrates broad base and mild imbalance . Unable to heel, toe and tandem walk without difficulty.  Reflexes: 1+ and symmetric. Toes downgoing.       ASSESSMENT: 52 Caucasian male with suspect prior mild cognitive impairment which has now converted to mild dementia following left subcortical infarctin January 2015 He has not been placed on aspirin due to history of recurrent GI bleeds from AVMs on aspirin in the past .   PLAN: I had a long discussion with the patient and his wife regarding his dementia and unfortunately he was unable to tolerate aricept but I  recommend a trial of Namenda XR. We will begin with a starter pack and if tolerated take Namenda XR 28 mg daily. I have given him a prescription and discussed possible side effects with the patient and his wife and answered questions.  Return for follow-up in 2 months or call earlier if necessary  Note: This document was prepared with digital dictation and possible smart phrase technology. Any transcriptional errors that result from this process are  unintentional

## 2014-06-22 ENCOUNTER — Telehealth: Payer: Self-pay | Admitting: Neurology

## 2014-06-22 NOTE — Telephone Encounter (Signed)
I called the patient.  Bryan Moore said they already got a starter pack from Dr [name] when they were in the office.  I called the pharmacy back.  Spoke with pharmacist.  She is aware.

## 2014-06-22 NOTE — Telephone Encounter (Signed)
[facility] cannot get the [employer] for pt. There are two opitions.  She needs a call back.  Please call and advise.

## 2014-07-12 DIAGNOSIS — N3941 Urge incontinence: Secondary | ICD-10-CM | POA: Diagnosis not present

## 2014-07-12 DIAGNOSIS — Z8551 Personal history of malignant neoplasm of bladder: Secondary | ICD-10-CM | POA: Diagnosis not present

## 2014-07-14 DIAGNOSIS — H4011X3 Primary open-angle glaucoma, severe stage: Secondary | ICD-10-CM | POA: Diagnosis not present

## 2014-08-26 DIAGNOSIS — C670 Malignant neoplasm of trigone of bladder: Secondary | ICD-10-CM | POA: Diagnosis not present

## 2014-08-26 DIAGNOSIS — Z8551 Personal history of malignant neoplasm of bladder: Secondary | ICD-10-CM | POA: Diagnosis not present

## 2014-09-01 DIAGNOSIS — I10 Essential (primary) hypertension: Secondary | ICD-10-CM | POA: Diagnosis not present

## 2014-09-01 DIAGNOSIS — D649 Anemia, unspecified: Secondary | ICD-10-CM | POA: Diagnosis not present

## 2014-09-03 DIAGNOSIS — F0281 Dementia in other diseases classified elsewhere with behavioral disturbance: Secondary | ICD-10-CM | POA: Diagnosis not present

## 2014-09-03 DIAGNOSIS — I69928 Other speech and language deficits following unspecified cerebrovascular disease: Secondary | ICD-10-CM | POA: Diagnosis not present

## 2014-09-03 DIAGNOSIS — E780 Pure hypercholesterolemia: Secondary | ICD-10-CM | POA: Diagnosis not present

## 2014-09-03 DIAGNOSIS — D649 Anemia, unspecified: Secondary | ICD-10-CM | POA: Diagnosis not present

## 2014-09-03 DIAGNOSIS — I10 Essential (primary) hypertension: Secondary | ICD-10-CM | POA: Diagnosis not present

## 2014-09-03 DIAGNOSIS — Z952 Presence of prosthetic heart valve: Secondary | ICD-10-CM | POA: Diagnosis not present

## 2014-09-06 DIAGNOSIS — N400 Enlarged prostate without lower urinary tract symptoms: Secondary | ICD-10-CM | POA: Diagnosis not present

## 2014-09-06 DIAGNOSIS — C670 Malignant neoplasm of trigone of bladder: Secondary | ICD-10-CM | POA: Diagnosis not present

## 2014-09-20 ENCOUNTER — Ambulatory Visit: Payer: Self-pay | Admitting: Neurology

## 2014-09-21 ENCOUNTER — Ambulatory Visit: Payer: Medicare Other | Admitting: Neurology

## 2014-10-20 DIAGNOSIS — C670 Malignant neoplasm of trigone of bladder: Secondary | ICD-10-CM | POA: Diagnosis not present

## 2014-10-20 DIAGNOSIS — N3941 Urge incontinence: Secondary | ICD-10-CM | POA: Diagnosis not present

## 2014-10-21 DIAGNOSIS — L570 Actinic keratosis: Secondary | ICD-10-CM | POA: Diagnosis not present

## 2014-10-21 DIAGNOSIS — B352 Tinea manuum: Secondary | ICD-10-CM | POA: Diagnosis not present

## 2014-10-21 DIAGNOSIS — L821 Other seborrheic keratosis: Secondary | ICD-10-CM | POA: Diagnosis not present

## 2014-12-01 DIAGNOSIS — D649 Anemia, unspecified: Secondary | ICD-10-CM | POA: Diagnosis not present

## 2014-12-01 DIAGNOSIS — I10 Essential (primary) hypertension: Secondary | ICD-10-CM | POA: Diagnosis not present

## 2014-12-06 DIAGNOSIS — I69928 Other speech and language deficits following unspecified cerebrovascular disease: Secondary | ICD-10-CM | POA: Diagnosis not present

## 2014-12-06 DIAGNOSIS — Z952 Presence of prosthetic heart valve: Secondary | ICD-10-CM | POA: Diagnosis not present

## 2014-12-06 DIAGNOSIS — D649 Anemia, unspecified: Secondary | ICD-10-CM | POA: Diagnosis not present

## 2014-12-06 DIAGNOSIS — E780 Pure hypercholesterolemia: Secondary | ICD-10-CM | POA: Diagnosis not present

## 2014-12-06 DIAGNOSIS — I10 Essential (primary) hypertension: Secondary | ICD-10-CM | POA: Diagnosis not present

## 2014-12-06 DIAGNOSIS — F0281 Dementia in other diseases classified elsewhere with behavioral disturbance: Secondary | ICD-10-CM | POA: Diagnosis not present

## 2015-01-12 DIAGNOSIS — H4011X3 Primary open-angle glaucoma, severe stage: Secondary | ICD-10-CM | POA: Diagnosis not present

## 2015-01-25 DIAGNOSIS — N3941 Urge incontinence: Secondary | ICD-10-CM | POA: Diagnosis not present

## 2015-01-25 DIAGNOSIS — Z8551 Personal history of malignant neoplasm of bladder: Secondary | ICD-10-CM | POA: Diagnosis not present

## 2015-02-18 ENCOUNTER — Emergency Department (HOSPITAL_COMMUNITY): Payer: Medicare Other

## 2015-02-18 ENCOUNTER — Emergency Department (HOSPITAL_COMMUNITY)
Admission: EM | Admit: 2015-02-18 | Discharge: 2015-02-18 | Disposition: A | Discharge status: Home / Self Care | Payer: Medicare Other | Attending: Emergency Medicine | Admitting: Emergency Medicine

## 2015-02-18 ENCOUNTER — Encounter (HOSPITAL_COMMUNITY): Payer: Self-pay | Admitting: Physical Medicine and Rehabilitation

## 2015-02-18 DIAGNOSIS — S51011A Laceration without foreign body of right elbow, initial encounter: Secondary | ICD-10-CM | POA: Diagnosis not present

## 2015-02-18 DIAGNOSIS — Z8669 Personal history of other diseases of the nervous system and sense organs: Secondary | ICD-10-CM | POA: Diagnosis not present

## 2015-02-18 DIAGNOSIS — F0390 Unspecified dementia without behavioral disturbance: Secondary | ICD-10-CM | POA: Diagnosis not present

## 2015-02-18 DIAGNOSIS — I10 Essential (primary) hypertension: Secondary | ICD-10-CM | POA: Insufficient documentation

## 2015-02-18 DIAGNOSIS — E785 Hyperlipidemia, unspecified: Secondary | ICD-10-CM | POA: Insufficient documentation

## 2015-02-18 DIAGNOSIS — W1839XA Other fall on same level, initial encounter: Secondary | ICD-10-CM | POA: Diagnosis not present

## 2015-02-18 DIAGNOSIS — S199XXA Unspecified injury of neck, initial encounter: Secondary | ICD-10-CM | POA: Diagnosis not present

## 2015-02-18 DIAGNOSIS — Y998 Other external cause status: Secondary | ICD-10-CM | POA: Diagnosis not present

## 2015-02-18 DIAGNOSIS — K219 Gastro-esophageal reflux disease without esophagitis: Secondary | ICD-10-CM | POA: Diagnosis not present

## 2015-02-18 DIAGNOSIS — S0101XA Laceration without foreign body of scalp, initial encounter: Secondary | ICD-10-CM | POA: Insufficient documentation

## 2015-02-18 DIAGNOSIS — Z862 Personal history of diseases of the blood and blood-forming organs and certain disorders involving the immune mechanism: Secondary | ICD-10-CM | POA: Insufficient documentation

## 2015-02-18 DIAGNOSIS — Z87891 Personal history of nicotine dependence: Secondary | ICD-10-CM | POA: Diagnosis not present

## 2015-02-18 DIAGNOSIS — W19XXXA Unspecified fall, initial encounter: Secondary | ICD-10-CM

## 2015-02-18 DIAGNOSIS — Y92031 Bathroom in apartment as the place of occurrence of the external cause: Secondary | ICD-10-CM | POA: Insufficient documentation

## 2015-02-18 DIAGNOSIS — Y9389 Activity, other specified: Secondary | ICD-10-CM | POA: Diagnosis not present

## 2015-02-18 DIAGNOSIS — R011 Cardiac murmur, unspecified: Secondary | ICD-10-CM | POA: Diagnosis not present

## 2015-02-18 DIAGNOSIS — Z79899 Other long term (current) drug therapy: Secondary | ICD-10-CM | POA: Diagnosis not present

## 2015-02-18 DIAGNOSIS — Z8551 Personal history of malignant neoplasm of bladder: Secondary | ICD-10-CM | POA: Diagnosis not present

## 2015-02-18 DIAGNOSIS — S50311A Abrasion of right elbow, initial encounter: Secondary | ICD-10-CM | POA: Diagnosis not present

## 2015-02-18 DIAGNOSIS — Z880 Allergy status to penicillin: Secondary | ICD-10-CM | POA: Diagnosis not present

## 2015-02-18 DIAGNOSIS — Z8673 Personal history of transient ischemic attack (TIA), and cerebral infarction without residual deficits: Secondary | ICD-10-CM | POA: Insufficient documentation

## 2015-02-18 DIAGNOSIS — M1990 Unspecified osteoarthritis, unspecified site: Secondary | ICD-10-CM | POA: Diagnosis not present

## 2015-02-18 DIAGNOSIS — Z23 Encounter for immunization: Secondary | ICD-10-CM | POA: Diagnosis not present

## 2015-02-18 DIAGNOSIS — S0990XA Unspecified injury of head, initial encounter: Secondary | ICD-10-CM | POA: Diagnosis not present

## 2015-02-18 MED ORDER — TETANUS-DIPHTH-ACELL PERTUSSIS 5-2.5-18.5 LF-MCG/0.5 IM SUSP
0.5000 mL | Freq: Once | INTRAMUSCULAR | Status: AC
  Administered 2015-02-18: 0.5 mL via INTRAMUSCULAR
  Filled 2015-02-18: qty 0.5

## 2015-02-18 NOTE — ED Notes (Signed)
Pt transported to CT and x-ray  

## 2015-02-18 NOTE — ED Notes (Signed)
Dr. Wright at bedside.

## 2015-02-18 NOTE — Discharge Instructions (Signed)

## 2015-02-18 NOTE — ED Notes (Signed)
Pt returned to room  

## 2015-02-18 NOTE — ED Notes (Signed)
Pt presents to department for evaluation of fall and laceration to head. Wife states he fell this morning in bathroom, stuck head on floor. Laceration noted to back of scalp. Denies LOC. Pt is alert, but confused, pt has dementia.

## 2015-02-18 NOTE — ED Provider Notes (Signed)
CSN: [id number]     Arrival date & time 02/18/15  1414 History   None    Chief Complaint  Patient presents with  . Fall  . Laceration     (Consider location/radiation/quality/duration/timing/severity/associated sxs/prior Treatment) Patient is a 81 y.o. male presenting with fall. The history is provided by the patient and the spouse. No language interpreter was used.  Fall This is a new problem. The current episode started today. The problem has been unchanged. Associated symptoms include headaches. Pertinent negatives include no abdominal pain, anorexia, change in bowel habit, chest pain, congestion, coughing, diaphoresis, fatigue, fever, myalgias, nausea, neck pain, rash, urinary symptoms, visual change, vomiting or weakness. Nothing aggravates the symptoms. He has tried nothing for the symptoms.    Past Medical History  Diagnosis Date  . Anemia     from AVM's. Requiring transfusion  . Hyperlipemia   . Aortic stenosis     s/p TAVI July 2012  . Glaucoma   . Mitral regurgitation   . Shingles May 2013    right arm residual pain- last outbreak 1 1/2 yrs ago.  Marland Kitchen Hypertension     Not taking Lisinopril due to BP drops low.  . Transfusion history     last 2 yrs ago.  Marland Kitchen GERD (gastroesophageal reflux disease)     RARE - AND NO MEDS  . Cancer     BLADDER CANCER  . Arthritis   . Balance problem     WEAK ON RIGHT SIDE ( SCOLISOSIS AND HX OF A BACK SURGERY) - BALANCE PROBLEM - "FALLS BACKWARD" - FREQUENT FALLS.  USES CANE WHEN AMBULATING  . Complication of anesthesia     VERY DROWSY FOR HOURS AFTER SURGERY - AND DISORIENTATION  . Heart murmur     PT'S CARDIOLOGIST IS DR. Acie Fredrickson -LAST OFFICE VISIT 03/02/13 IN [facility]  . Dementia     more memory problems from stroke  . Stroke    Past Surgical History  Procedure Laterality Date  . Appendectomy    . Cardiac catheterization  10/23/2010    ef 65%  . US echocardiography  08/31/10    EF 55-60%  . Cardiovascular stress test  01/01/2007    EF  64%, NO EVIDENCE OF ISCHEMIA  . Cardiac valve replacement      02/2011([facility])  . Transurethral resection of bladder tumor with gyrus (turbt-gyrus) N/A 04/13/2013    Procedure: TRANSURETHRAL RESECTION OF BLADDER TUMOR WITH GYRUS (TURBT-GYRUS);  Surgeon: [name], MD;  Location: [facility];  Service: Urology;  Laterality: N/A;  . Cystoscopy with stent placement Left 04/13/2013    Procedure:  LEFT URETERAL STENT PLACEMENT;  Surgeon: [name], MD;  Location: [facility];  Service: Urology;  Laterality: Left;  . Back surgery  2010 OR 2011  . Transurethral resection of bladder tumor N/A 06/04/2013    Procedure: RESTAGING TRANSURETHRAL RESECTION OF BLADDER TUMOR (TURBT), URETERAL CATHERIZATION ;  Surgeon: [name], MD;  Location: [facility];  Service: Urology;  Laterality: N/A;  CYSTO RESTAGING TURBT     . Eye surgery      bilateral cataracts with lens implants  . Cystoscopy w/ retrogrades Bilateral 09/17/2013    Procedure: CYSTOSCOPY WITH BILATERAL RETROGRADE;  Surgeon: [name], MD;  Location: [facility];  Service: Urology;  Laterality: Bilateral;  . Transurethral resection of bladder tumor N/A 09/17/2013    Procedure: TRANSURETHRAL RESECTION OF BLADDER TUMOR WITH MITOMYCIN-C;  Surgeon: [name], MD;  Location: [facility];  Service: Urology;  Laterality:  N/A;   History reviewed. No pertinent family history. History  Substance Use Topics  . Smoking status: Former Smoker    Quit date: 07/04/1981  . Smokeless tobacco: Never Used  . Alcohol Use: 0.6 oz/week    1 Glasses of wine per week     Comment: nightly    Review of Systems  Constitutional: Negative for fever, diaphoresis and fatigue.  HENT: Negative for congestion.   Respiratory: Negative for cough, chest tightness and shortness of breath.   Cardiovascular: Negative for chest pain.  Gastrointestinal: Negative for nausea, vomiting, abdominal pain, constipation, anorexia and change in bowel habit.  Genitourinary: Negative for difficulty  urinating.  Musculoskeletal: Positive for gait problem (unsteady gait at baseline, supposed to walk with cane). Negative for myalgias and neck pain.  Skin: Positive for wound (right scalp). Negative for rash.  Neurological: Positive for headaches. Negative for speech difficulty, weakness and light-headedness.  Psychiatric/Behavioral: Positive for confusion (dementia).  All other systems reviewed and are negative.     Allergies  Advil; Aspirin; Penicillins; and Ativan  Home Medications   Prior to Admission medications   Medication Sig Start Date End Date Taking? Authorizing Provider  acetaminophen (TYLENOL) 325 MG tablet Take 650 mg by mouth every 6 (six) hours as needed for moderate pain.     Historical Provider, MD  amLODipine (NORVASC) 5 MG tablet Take 1 tablet (5 mg total) by mouth daily. 11/05/13   Bary Leriche, PA-C  atorvastatin (LIPITOR) 10 MG tablet Take 1 tablet (10 mg total) by mouth daily at 6 PM. 08/28/13   Ivan Anchors Love, PA-C  brimonidine (ALPHAGAN P) 0.1 % SOLN Place 1 drop into both eyes 2 (two) times daily.     Historical Provider, MD  brimonidine (ALPHAGAN) 0.15 % ophthalmic solution  05/14/14   Historical Provider, MD  cholecalciferol (VITAMIN D) 1000 UNITS tablet Take 1,000 Units by mouth every morning.     Historical Provider, MD  donepezil (ARICEPT) 5 MG tablet Take 1 tablet (5 mg total) by mouth at bedtime. Take 1 tablet daily x 4 weeks then two tablets daily 02/24/14 02/24/15  Garvin Fila, MD  dorzolamide-timolol (COSOPT) 22.3-6.8 MG/ML ophthalmic solution Place 1 drop into both eyes every 12 (twelve) hours.  04/02/11   Historical Provider, MD  iron polysaccharides (NIFEREX) 150 MG capsule Take 150 mg by mouth every morning.     Historical Provider, MD  latanoprost (XALATAN) 0.005 % ophthalmic solution Place 1 drop into both eyes at bedtime.  04/02/11   Historical Provider, MD  Memantine HCl ER 28 MG CP24 Take 28 mg by mouth every morning. 06/16/14   Garvin Fila, MD   Memantine HCl ER 7 & 14 & 21 &28 MG CP24 Take 7 mg by mouth every morning. 06/16/14   Garvin Fila, MD  Multiple Vitamin (MULTIVITAMIN WITH MINERALS) TABS tablet Take 1 tablet by mouth every morning.     Historical Provider, MD  traMADol (ULTRAM) 50 MG tablet Take 50 mg by mouth every 6 (six) hours as needed.    Historical Provider, MD  vitamin B-12 (CYANOCOBALAMIN) 250 MCG tablet Take 250 mcg by mouth every morning.     Historical Provider, MD   BP 128/50 mmHg  Pulse 63  Temp(Src) 98.7 F (37.1 C) (Oral)  Resp 18  SpO2 96% Physical Exam  Constitutional: He appears well-developed and well-nourished. No distress.  HENT:  Head: Normocephalic.  Nose: Nose normal.  Mouth/Throat: Oropharynx is clear and moist. No oropharyngeal exudate.  1.5 cm linear laceration at right posterior scalp, hemostatic with underlying hematoma palpable.  No underlying bony deformity palpable.  HEENT exam otherwise atraumatic  Eyes: EOM are normal. Pupils are equal, round, and reactive to light.  Neck: Normal range of motion. Neck supple.  Cardiovascular: Normal rate, regular rhythm, normal heart sounds and intact distal pulses.   No murmur heard. Pulmonary/Chest: Effort normal and breath sounds normal. No respiratory distress. He has no wheezes. He exhibits no tenderness.  Abdominal: Soft. He exhibits no distension. There is no tenderness. There is no guarding.  Musculoskeletal: Normal range of motion. He exhibits tenderness.  Tender to palpation of right elbow at lateral epicondyle.  2 x superficial skin tears at right lateral proximal forearm.  No chest tenderness to palpation, stable, and no crepitus.  No pelvis tenderness to palpation and stable.  Moving all 4 extremities.  No C/T/L spine midline tenderness, no step offs.     Neurological: He is alert. No cranial nerve deficit. Coordination normal.  Oriented to person only.  Unable to follow higher level questioning.   Skin: Skin is warm and dry. He is  not diaphoretic. No pallor.  Psychiatric: He has a normal mood and affect. His behavior is normal. Judgment and thought content normal.  Nursing note and vitals reviewed.   ED Course  LACERATION REPAIR Date/Time: 02/18/2015 5:30 PM Performed by: Tori Milks Authorized by: Tori Milks Consent: Verbal consent obtained. Risks and benefits: risks, benefits and alternatives were discussed Consent given by: patient Required items: required blood products, implants, devices, and special equipment available Patient identity confirmed: verbally with patient Time out: Immediately prior to procedure a "time out" was called to verify the correct patient, procedure, equipment, support staff and site/side marked as required. Body area: head/neck Location details: scalp Laceration length: 1.5 cm Foreign bodies: no foreign bodies Tendon involvement: none Nerve involvement: none Vascular damage: no Patient sedated: no Preparation: Patient was prepped and draped in the usual sterile fashion. Irrigation solution: saline Irrigation method: syringe Amount of cleaning: standard Debridement: none Degree of undermining: none Skin closure: staples Number of sutures: 3 Approximation: close Approximation difficulty: simple Dressing: antibiotic ointment Patient tolerance: Patient tolerated the procedure well with no immediate complications   (including critical care time) Labs Review Labs Reviewed - No data to display  Imaging Review Dg Elbow 2 Views Right  02/18/2015   CLINICAL DATA:  Fall today. Lateral right elbow pain and abrasion. Initial encounter.  EXAM: RIGHT ELBOW - 2 VIEW  COMPARISON:  None.  FINDINGS: There is no evidence of fracture, dislocation, or joint effusion. Mild degenerative spurring is seen involving the medial and lateral humeral epicondyles. No other significant bone abnormality identified.  Soft tissue swelling and laceration seen along the dorsal aspect of the proximal  ulna. No evidence for radiopaque foreign body.  IMPRESSION: Mild soft tissue swelling and laceration along the dorsal aspect of the proximal ulna  No evidence of fracture or radiopaque foreign body.   Electronically Signed   By: Earle Gell M.D.   On: 02/18/2015 16:36   Ct Head Wo Contrast  02/18/2015   CLINICAL DATA:  [place].  Hit head.  Laceration.  EXAM: CT HEAD WITHOUT CONTRAST  TECHNIQUE: Contiguous axial images were obtained from the base of the skull through the vertex without intravenous contrast.  COMPARISON:  11/25/2013  FINDINGS: Stable age related cerebral atrophy, ventriculomegaly and periventricular white matter disease. Remote lacunar-type basal ganglia infarcts are stable. No extra-axial fluid collections are identified. No  CT findings for acute hemispheric infarction or intracranial hemorrhage. No mass lesions. The brainstem and cerebellum are normal.  No acute skull fracture is identified. No skull lesions. The paranasal sinuses and mastoid air cells are clear.  IMPRESSION: Stable age related cerebral atrophy, ventriculomegaly and periventricular white matter disease.  No new/ acute intracranial findings or skull fracture.   Electronically Signed   By: Marijo Sanes M.D.   On: 02/18/2015 15:12   Ct Cervical Spine Wo Contrast  02/18/2015   CLINICAL DATA:  Status post fall, no loss of consciousness  EXAM: CT CERVICAL SPINE WITHOUT CONTRAST  TECHNIQUE: Multidetector CT imaging of the cervical spine was performed without intravenous contrast. Multiplanar CT image reconstructions were also generated.  COMPARISON:  None.  FINDINGS: The alignment is anatomic. The vertebral body heights are maintained. There is no acute fracture. There is 5 mm of anterolisthesis of C7 on T1 secondary to severe bilateral facet arthropathy. The prevertebral soft tissues are normal. The intraspinal soft tissues are not fully imaged on this examination due to poor soft tissue contrast, but there is no gross soft tissue  abnormality.  There is severe degenerative disc disease at C4-5, C5-6 and C6-7 with disc space narrowing and discogenic endplate osteophytes. There are mild broad-based disc osteophyte complexes at C4-5, C5-6 and C6-7.  There is bilateral facet arthropathy at C2-3 with mild left foraminal narrowing. There is moderate right and mild left facet arthropathy at C3-4 with right uncovertebral degenerative change resulting in right foraminal narrowing. There is moderate bilateral facet arthropathy, right greater than left at C4-5 with bilateral uncovertebral degenerative change and bilateral foraminal stenosis. There are bilateral uncovertebral degenerative changes at C5-6 and C6-7 resulting in bilateral foraminal stenosis.  The visualized portions of the lung apices demonstrate no focal abnormality. Incidental note is made of an aberrant right subclavian artery with a retroesophageal course.  IMPRESSION: 1. No acute osseous injury of the cervical spine. 2. Cervical spine spondylosis as detailed above.   Electronically Signed   By: Kathreen Devoid   On: 02/18/2015 16:21     EKG Interpretation None      MDM   Final diagnoses:  Fall from standing, initial encounter  Scalp laceration, initial encounter    Fall this AM while ambulating in the bathroom without his cane.  Tripped on his wife's hair dryer cord and fell backwards, hitting his right posterior head on the wall.  No LOC.  Has been ambulatory since.  Has a hx of dementia and is at baseline per wife.  Is confused, requires frequent re-direction, and has somewhat limited higher level functioning.    1.5 cm right posterior scalp lac, hemostatic.  Tender to palpation around the lac.  Also has 2 x superficial skin tears on right elbow.  Mild tenderness to elbow palpation around lateral epicondyle.  Moving all extremities spontaneously and otherwise nontender throughout.  Offered pain meds but pt declined on several occasions.  Tdap updated.  CT head,  c-spine, and right elbow xray with no acute injuries seen.    Scalp lac irrigated and closed with 3 x staples.  Patient tolerated it well.  Wound edges were well approximated.  Bacitracin applied over the wound.   Discussed wound care with pt and his wife.  To follow up in 1 week with PCP for staple removal Given Rx for quad based cane to help with ambulation at home   If performed, labs, EKGs, and imaging were reviewed and interpreted by myself and my  attending, and incorporated in the medical decision making.  Patient was seen with ED Attending, Dr. Elisabeth Most, MD   Tori Milks, MD 02/19/15 [id number]  Malvin Johns, MD 02/19/15 [place], MD 03/02/15 1540

## 2015-02-23 ENCOUNTER — Emergency Department (HOSPITAL_COMMUNITY)
Admission: EM | Admit: 2015-02-23 | Discharge: 2015-02-23 | Disposition: A | Discharge status: Home / Self Care | Payer: Medicare Other | Attending: Emergency Medicine | Admitting: Emergency Medicine

## 2015-02-23 ENCOUNTER — Emergency Department (HOSPITAL_COMMUNITY): Payer: Medicare Other

## 2015-02-23 ENCOUNTER — Encounter (HOSPITAL_COMMUNITY): Payer: Self-pay | Admitting: Emergency Medicine

## 2015-02-23 DIAGNOSIS — Z8619 Personal history of other infectious and parasitic diseases: Secondary | ICD-10-CM | POA: Insufficient documentation

## 2015-02-23 DIAGNOSIS — E785 Hyperlipidemia, unspecified: Secondary | ICD-10-CM | POA: Insufficient documentation

## 2015-02-23 DIAGNOSIS — Z8739 Personal history of other diseases of the musculoskeletal system and connective tissue: Secondary | ICD-10-CM | POA: Diagnosis not present

## 2015-02-23 DIAGNOSIS — Z952 Presence of prosthetic heart valve: Secondary | ICD-10-CM | POA: Insufficient documentation

## 2015-02-23 DIAGNOSIS — I10 Essential (primary) hypertension: Secondary | ICD-10-CM | POA: Diagnosis not present

## 2015-02-23 DIAGNOSIS — Z87828 Personal history of other (healed) physical injury and trauma: Secondary | ICD-10-CM | POA: Insufficient documentation

## 2015-02-23 DIAGNOSIS — H409 Unspecified glaucoma: Secondary | ICD-10-CM | POA: Insufficient documentation

## 2015-02-23 DIAGNOSIS — F0390 Unspecified dementia without behavioral disturbance: Secondary | ICD-10-CM | POA: Diagnosis not present

## 2015-02-23 DIAGNOSIS — R404 Transient alteration of awareness: Secondary | ICD-10-CM

## 2015-02-23 DIAGNOSIS — Z87891 Personal history of nicotine dependence: Secondary | ICD-10-CM | POA: Diagnosis not present

## 2015-02-23 DIAGNOSIS — Z79899 Other long term (current) drug therapy: Secondary | ICD-10-CM | POA: Insufficient documentation

## 2015-02-23 DIAGNOSIS — D649 Anemia, unspecified: Secondary | ICD-10-CM | POA: Diagnosis not present

## 2015-02-23 DIAGNOSIS — S299XXA Unspecified injury of thorax, initial encounter: Secondary | ICD-10-CM | POA: Diagnosis not present

## 2015-02-23 DIAGNOSIS — Z8551 Personal history of malignant neoplasm of bladder: Secondary | ICD-10-CM | POA: Insufficient documentation

## 2015-02-23 DIAGNOSIS — Z9889 Other specified postprocedural states: Secondary | ICD-10-CM | POA: Diagnosis not present

## 2015-02-23 DIAGNOSIS — R4182 Altered mental status, unspecified: Secondary | ICD-10-CM | POA: Diagnosis present

## 2015-02-23 DIAGNOSIS — R011 Cardiac murmur, unspecified: Secondary | ICD-10-CM | POA: Insufficient documentation

## 2015-02-23 DIAGNOSIS — Z880 Allergy status to penicillin: Secondary | ICD-10-CM | POA: Insufficient documentation

## 2015-02-23 DIAGNOSIS — Z8719 Personal history of other diseases of the digestive system: Secondary | ICD-10-CM | POA: Insufficient documentation

## 2015-02-23 DIAGNOSIS — Z8673 Personal history of transient ischemic attack (TIA), and cerebral infarction without residual deficits: Secondary | ICD-10-CM | POA: Insufficient documentation

## 2015-02-23 DIAGNOSIS — R410 Disorientation, unspecified: Secondary | ICD-10-CM | POA: Diagnosis not present

## 2015-02-23 LAB — CBC
HEMATOCRIT: 32.8 % — AB (ref 39.0–52.0)
Hemoglobin: 10.9 g/dL — ABNORMAL LOW (ref 13.0–17.0)
MCH: 32.3 pg (ref 26.0–34.0)
MCHC: 33.2 g/dL (ref 30.0–36.0)
MCV: 97.3 fL (ref 78.0–100.0)
Platelets: 138 10*3/uL — ABNORMAL LOW (ref 150–400)
RBC: 3.37 MIL/uL — AB (ref 4.22–5.81)
RDW: 13.8 % (ref 11.5–15.5)
WBC: 4.5 10*3/uL (ref 4.0–10.5)

## 2015-02-23 LAB — I-STAT CHEM 8, ED
BUN: 23 mg/dL — AB (ref 6–20)
CREATININE: 1.4 mg/dL — AB (ref 0.61–1.24)
Calcium, Ion: 1.21 mmol/L (ref 1.13–1.30)
Chloride: 103 mmol/L (ref 101–111)
GLUCOSE: 104 mg/dL — AB (ref 65–99)
HCT: 34 % — ABNORMAL LOW (ref 39.0–52.0)
HEMOGLOBIN: 11.6 g/dL — AB (ref 13.0–17.0)
Potassium: 4.2 mmol/L (ref 3.5–5.1)
Sodium: 141 mmol/L (ref 135–145)
TCO2: 23 mmol/L (ref 0–100)

## 2015-02-23 LAB — COMPREHENSIVE METABOLIC PANEL
ALT: 10 U/L — ABNORMAL LOW (ref 17–63)
AST: 17 U/L (ref 15–41)
Albumin: 3.4 g/dL — ABNORMAL LOW (ref 3.5–5.0)
Alkaline Phosphatase: 64 U/L (ref 38–126)
Anion gap: 9 (ref 5–15)
BILIRUBIN TOTAL: 0.4 mg/dL (ref 0.3–1.2)
BUN: 21 mg/dL — ABNORMAL HIGH (ref 6–20)
CO2: 24 mmol/L (ref 22–32)
CREATININE: 1.46 mg/dL — AB (ref 0.61–1.24)
Calcium: 9 mg/dL (ref 8.9–10.3)
Chloride: 107 mmol/L (ref 101–111)
GFR calc Af Amer: 46 mL/min — ABNORMAL LOW (ref >60)
GFR calc non Af Amer: 40 mL/min — ABNORMAL LOW (ref >60)
GLUCOSE: 105 mg/dL — AB (ref 65–99)
Potassium: 4.3 mmol/L (ref 3.5–5.1)
SODIUM: 140 mmol/L (ref 135–145)
TOTAL PROTEIN: 7.6 g/dL (ref 6.5–8.1)

## 2015-02-23 LAB — DIFFERENTIAL
BASOS ABS: 0 10*3/uL (ref 0.0–0.1)
Basophils Relative: 0 % (ref 0–1)
Eosinophils Absolute: 0 10*3/uL (ref 0.0–0.7)
Eosinophils Relative: 1 % (ref 0–5)
LYMPHS ABS: 0.6 10*3/uL — AB (ref 0.7–4.0)
Lymphocytes Relative: 12 % (ref 12–46)
MONO ABS: 0.3 10*3/uL (ref 0.1–1.0)
Monocytes Relative: 6 % (ref 3–12)
Neutro Abs: 3.7 10*3/uL (ref 1.7–7.7)
Neutrophils Relative %: 81 % — ABNORMAL HIGH (ref 43–77)

## 2015-02-23 LAB — URINALYSIS, ROUTINE W REFLEX MICROSCOPIC
Bilirubin Urine: NEGATIVE
Glucose, UA: NEGATIVE mg/dL
HGB URINE DIPSTICK: NEGATIVE
Ketones, ur: NEGATIVE mg/dL
Leukocytes, UA: NEGATIVE
NITRITE: NEGATIVE
Protein, ur: NEGATIVE mg/dL
Specific Gravity, Urine: 1.017 (ref 1.005–1.030)
UROBILINOGEN UA: 0.2 mg/dL (ref 0.0–1.0)
pH: 7.5 (ref 5.0–8.0)

## 2015-02-23 LAB — TROPONIN I: Troponin I: <0.03 ng/mL (ref <0.031)

## 2015-02-23 NOTE — ED Notes (Signed)
Pt seen here Friday for a fall. Pt had CT scan, and stitches placed. Pt's wife states he has been acting unusual today and last night. 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

## 2015-02-23 NOTE — ED Provider Notes (Signed)
Patient seen/examined in the Emergency Department in conjunction with Resident Physician Provider  Patient presents with confusion over past 24 hours Exam : awake/alert, pt is confused but follows commands Plan: AMS workup started    Ripley Fraise, MD 02/23/15 1429

## 2015-02-23 NOTE — ED Provider Notes (Signed)
CSN: [id number]     Arrival date & time 02/23/15  1302 History   First MD Initiated Contact with Patient 02/23/15 1327     Chief Complaint  Patient presents with  . Follow-up  . Altered Mental Status     (Consider location/radiation/quality/duration/timing/severity/associated sxs/prior Treatment) HPI Bryan Moore is a 81 yo caucasian male with hx of dementia presenting with change in baseline mental status.  Pt was seen and evaluated last Friday 7/15 for a fall in which he had a posterior scalp laceration and right elbow laceration repaired.  At that time, pt had a CT head and neck were negative for acute pathology.  Pt is a poor historian and most of HPI obtained from his wife who is present in the room.  Last night, wife states that her husband developed worsening confusion and agitation, stating that he needed to catch a flight back to [place] for work.  Pt has an unsteady gait for which he uses a cain.  Patients wife states that this has not deteriorated since last night.  Pt is still able to follow commands and was tearful during the encounter.   Denies any chest pain, urinary complaints, fever, chills, vomiting, or change in bowel habits.  Laceration to scalp and right elbow appear to be healing well without obvious sign of infection Past Medical History  Diagnosis Date  . Anemia     from AVM's. Requiring transfusion  . Hyperlipemia   . Aortic stenosis     s/p TAVI July 2012  . Glaucoma   . Mitral regurgitation   . Shingles May 2013    right arm residual pain- last outbreak 1 1/2 yrs ago.  Marland Kitchen Hypertension     Not taking Lisinopril due to BP drops low.  . Transfusion history     last 2 yrs ago.  Marland Kitchen GERD (gastroesophageal reflux disease)     RARE - AND NO MEDS  . Cancer     BLADDER CANCER  . Arthritis   . Balance problem     WEAK ON RIGHT SIDE ( SCOLISOSIS AND HX OF A BACK SURGERY) - BALANCE PROBLEM - "FALLS BACKWARD" - FREQUENT FALLS.  USES CANE WHEN AMBULATING  . Complication  of anesthesia     VERY DROWSY FOR HOURS AFTER SURGERY - AND DISORIENTATION  . Heart murmur     PT'S CARDIOLOGIST IS DR. Acie Fredrickson -LAST OFFICE VISIT 03/02/13 IN [facility]  . Dementia     more memory problems from stroke  . Stroke    Past Surgical History  Procedure Laterality Date  . Appendectomy    . Cardiac catheterization  10/23/2010    ef 65%  . US echocardiography  08/31/10    EF 55-60%  . Cardiovascular stress test  01/01/2007    EF 64%, NO EVIDENCE OF ISCHEMIA  . Cardiac valve replacement      02/2011([facility])  . Transurethral resection of bladder tumor with gyrus (turbt-gyrus) N/A 04/13/2013    Procedure: TRANSURETHRAL RESECTION OF BLADDER TUMOR WITH GYRUS (TURBT-GYRUS);  Surgeon: [name], MD;  Location: [facility];  Service: Urology;  Laterality: N/A;  . Cystoscopy with stent placement Left 04/13/2013    Procedure:  LEFT URETERAL STENT PLACEMENT;  Surgeon: [name], MD;  Location: [facility];  Service: Urology;  Laterality: Left;  . Back surgery  2010 OR 2011  . Transurethral resection of bladder tumor N/A 06/04/2013    Procedure: RESTAGING TRANSURETHRAL RESECTION OF BLADDER TUMOR (TURBT), URETERAL CATHERIZATION ;  Surgeon: [name], MD;  Location: [facility];  Service: Urology;  Laterality: N/A;  CYSTO RESTAGING TURBT     . Eye surgery      bilateral cataracts with lens implants  . Cystoscopy w/ retrogrades Bilateral 09/17/2013    Procedure: CYSTOSCOPY WITH BILATERAL RETROGRADE;  Surgeon: [name], MD;  Location: [facility];  Service: Urology;  Laterality: Bilateral;  . Transurethral resection of bladder tumor N/A 09/17/2013    Procedure: TRANSURETHRAL RESECTION OF BLADDER TUMOR WITH MITOMYCIN-C;  Surgeon: [name], MD;  Location: [facility];  Service: Urology;  Laterality: N/A;   History reviewed. No pertinent family history. History  Substance Use Topics  . Smoking status: Former Smoker    Quit date: 07/04/1981  . Smokeless tobacco: Never Used  . Alcohol Use: 0.6 oz/week    1 Glasses  of wine per week     Comment: nightly    Review of Systems  Unable to perform ROS: Dementia  Psychiatric/Behavioral: Positive for confusion (dementia).      Allergies  Advil; Aspirin; Penicillins; Atorvastatin; Namenda; and Ativan  Home Medications   Prior to Admission medications   Medication Sig Start Date End Date Taking? Authorizing Provider  acetaminophen (TYLENOL) 500 MG tablet Take 500 mg by mouth every 6 (six) hours as needed for mild pain, moderate pain or headache.   Yes Historical Provider, MD  amLODipine (NORVASC) 5 MG tablet Take 1 tablet (5 mg total) by mouth daily. 11/05/13  Yes Ivan Anchors Love, PA-C  brimonidine (ALPHAGAN P) 0.1 % SOLN Place 1 drop into both eyes 2 (two) times daily.    Yes Historical Provider, MD  cholecalciferol (VITAMIN D) 1000 UNITS tablet Take 1,000 Units by mouth every morning.    Yes Historical Provider, MD  dorzolamide-timolol (COSOPT) 22.3-6.8 MG/ML ophthalmic solution Place 1 drop into both eyes every 12 (twelve) hours.  04/02/11  Yes Historical Provider, MD  iron polysaccharides (NIFEREX) 150 MG capsule Take 150 mg by mouth every morning.    Yes Historical Provider, MD  Multiple Vitamin (MULTIVITAMIN WITH MINERALS) TABS tablet Take 1 tablet by mouth every morning.    Yes Historical Provider, MD  vitamin B-12 (CYANOCOBALAMIN) 1000 MCG tablet Take 1,000 mcg by mouth daily.   Yes Historical Provider, MD  atorvastatin (LIPITOR) 10 MG tablet Take 1 tablet (10 mg total) by mouth daily at 6 PM. Patient not taking: Reported on 02/18/2015 08/28/13   Ivan Anchors Love, PA-C  donepezil (ARICEPT) 5 MG tablet Take 1 tablet (5 mg total) by mouth at bedtime. Take 1 tablet daily x 4 weeks then two tablets daily Patient not taking: Reported on 02/18/2015 02/24/14 02/24/15  Garvin Fila, MD  Memantine HCl ER 28 MG CP24 Take 28 mg by mouth every morning. Patient not taking: Reported on 02/18/2015 06/16/14   Garvin Fila, MD  Memantine HCl ER 7 & 14 & 21 &28 MG CP24 Take  7 mg by mouth every morning. Patient not taking: Reported on 02/18/2015 06/16/14   Garvin Fila, MD   BP 127/81 mmHg  Pulse 64  Temp(Src) 97.2 F (36.2 C) (Oral)  Resp 16  Ht 5\' 7"  (1.702 m)  Wt 150 lb (68.04 kg)  BMI 23.49 kg/m2  SpO2 100% Physical Exam  Constitutional: He appears well-developed and well-nourished.  HENT:  Head: Normocephalic. Head is with laceration (posterior laceration healing with staples in place).  Neck: Normal range of motion.  Cardiovascular: Normal rate, regular rhythm and intact distal pulses.   Murmur  heard.  Systolic murmur is present  Pulmonary/Chest: Effort normal and breath sounds normal. He has no wheezes. He has no rales.  Abdominal: Soft. He exhibits no distension. There is no tenderness.  Neurological: He is alert. He has normal strength. He is disoriented. No cranial nerve deficit.  Oriented only to person only and able to follow basic commands  Skin: Skin is warm. He is not diaphoretic.  He as a healing laceration on right elbow and posterior scalp that appears to be healing without signs of infection. Staples still present.  Nursing note and vitals reviewed.   ED Course  Procedures (including critical care time) Labs Review Labs Reviewed  CBC - Abnormal; Notable for the following:    RBC 3.37 (*)    Hemoglobin 10.9 (*)    HCT 32.8 (*)    Platelets 138 (*)    All other components within normal limits  DIFFERENTIAL - Abnormal; Notable for the following:    Neutrophils Relative % 81 (*)    Lymphs Abs 0.6 (*)    All other components within normal limits  COMPREHENSIVE METABOLIC PANEL - Abnormal; Notable for the following:    Glucose, Bld 105 (*)    BUN 21 (*)    Creatinine, Ser 1.46 (*)    Albumin 3.4 (*)    ALT 10 (*)    GFR calc non Af Amer 40 (*)    GFR calc Af Amer 46 (*)    All other components within normal limits  I-STAT CHEM 8, ED - Abnormal; Notable for the following:    BUN 23 (*)    Creatinine, Ser 1.40 (*)     Glucose, Bld 104 (*)    Hemoglobin 11.6 (*)    HCT 34.0 (*)    All other components within normal limits  URINALYSIS, ROUTINE W REFLEX MICROSCOPIC (NOT AT [facility])  TROPONIN I    Imaging Review Dg Chest 2 View  02/23/2015   CLINICAL DATA:  Status post fall. History of hypertension, dementia, bladder cancer.  EXAM: CHEST  2 VIEW  COMPARISON:  Chest x-ray dated 08/18/2013.  FINDINGS: Mild cardiomegaly is unchanged. Aortic valve replacement hardware is stable in position. There is a probable small focus of chronic retrocardiac scarring and/or atelectasis, unchanged. No new lung findings. No evidence of pneumonia. No pleural effusion.  No acute osseous abnormality. Stable mild degenerative change noted within the thoracic spine  IMPRESSION: Stable chest x-ray. No evidence of acute cardiopulmonary abnormality. Mild cardiomegaly is unchanged.   Electronically Signed   By: Franki Cabot M.D.   On: 02/23/2015 16:03   Ct Head Wo Contrast  02/23/2015   CLINICAL DATA:  Confusion and weakness, history hyperlipidemia, hypertension, dementia, stroke, former smoker  EXAM: CT HEAD WITHOUT CONTRAST  TECHNIQUE: Contiguous axial images were obtained from the base of the skull through the vertex without intravenous contrast.  COMPARISON:  02/18/2015  FINDINGS: Generalized atrophy.  Normal ventricular morphology.  No midline shift or mass effect.  Small vessel chronic ischemic changes of deep cerebral white matter.  Large old lacunar infarct at genu of LEFT internal capsule.  No intracranial hemorrhage, mass lesion, or acute infarction.  No extra-axial fluid collections.  Visualized paranasal sinuses and mastoid air cells clear.  Bones unremarkable.  Staples at RIGHT posterior parietal scalp.  IMPRESSION: Generalized atrophy with small vessel chronic ischemic changes of deep cerebral white matter.  Old lacunar infarct at genu of LEFT internal capsule.  No acute intracranial abnormalities.   Electronically Signed  By: Lavonia Dana M.D.   On: 02/23/2015 15:22     EKG Interpretation   Date/Time:  Wednesday February 23 2015 13:09:14 EDT Ventricular Rate:  71 PR Interval:  206 QRS Duration: 136 QT Interval:  424 QTC Calculation: 460 R Axis:   44 Text Interpretation:  Normal sinus rhythm Left bundle branch block  Abnormal ECG No significant change since last tracing Confirmed by  Christy Gentles  MD, Elenore Rota ([id number]) on 02/23/2015 1:23:03 PM      MDM   Final diagnoses:  None    81 yo male with hx of dementia and acute onset altered mental status.  AMS workup revealed CT head negative for acute abnormalities, CXR was stable.  Urinalysis negative for UTI, CBC shows mild anemia but no evidence of infection.  CMP electrolytes normal, Creatine elevated at 1.4.  Troponin negative.  Pt appears improved in his mental status and level alertness.  Will discharge home with wife.  Possible that AMS could be progression of dementia.  Pts wife states he has follow up appt with PCP this Friday.     Jule Ser, DO 02/23/15 [place], MD 02/24/15 [phone]

## 2015-02-23 NOTE — Discharge Instructions (Signed)
Altered Mental Status Altered mental status most often refers to an abnormal change in your responsiveness and awareness. It can affect your speech, thought, mobility, memory, attention span, or alertness. It can range from slight confusion to complete unresponsiveness (coma). Altered mental status can be a sign of a serious underlying medical condition. Rapid evaluation and medical treatment is necessary for patients having an altered mental status. CAUSES   Low blood sugar (hypoglycemia) or diabetes.  Severe loss of body fluids (dehydration) or a body salt (electrolyte) imbalance.  A stroke or other neurologic problem, such as dementia or delirium.  A head injury or tumor.  A drug or alcohol overdose.  Exposure to toxins or poisons.  Depression, anxiety, and stress.  A low oxygen level (hypoxia).  An infection.  Blood loss.  Twitching or shaking (seizure).  Heart problems, such as heart attack or heart rhythm problems (arrhythmias).  A body temperature that is too low or too high (hypothermia or hyperthermia). DIAGNOSIS  A diagnosis is based on your history, symptoms, physical and neurologic examinations, and diagnostic tests. Diagnostic tests may include:  Measurement of your blood pressure, pulse, breathing, and oxygen levels (vital signs).  Blood tests.  Urine tests.  X-ray exams.  A computerized magnetic scan (magnetic resonance imaging, MRI).  A computerized X-ray scan (computed tomography, CT scan). TREATMENT  Treatment will depend on the cause. Treatment may include:  Management of an underlying medical or mental health condition.  Critical care or support in the hospital. [place]   Only take over-the-counter or prescription medicines for pain, discomfort, or fever as directed by your caregiver.  Manage underlying conditions as directed by your caregiver.  Eat a healthy, well-balanced diet to maintain strength.  Join a support group or  prevention program to cope with the condition or trauma that caused the altered mental status. Ask your caregiver to help choose a program that works for you.  Follow up with your caregiver for further examination, therapy, or testing as directed. SEEK MEDICAL CARE IF:   You feel unwell or have chills.  You or your family notice a change in your behavior or your alertness.  You have trouble following your caregiver's treatment plan.  You have questions or concerns. SEEK IMMEDIATE MEDICAL CARE IF:   You have a rapid heartbeat or have chest pain.  You have difficulty breathing.  You have a fever.  You have a headache with a stiff neck.  You cough up blood.  You have blood in your urine or stool.  You have severe agitation or confusion. MAKE SURE YOU:   Understand these instructions.  Will watch your condition.  Will get help right away if you are not doing well or get worse. Document Released: 01/10/2010 Document Revised: 10/15/2011 Document Reviewed: 01/10/2010 [facility] Patient Information 2015 [place], [place]. This information is not intended to replace advice given to you by your health care provider. Make sure you discuss any questions you have with your health care provider.  Dementia Dementia is a word that is used to describe problems with the brain and how it works. People with dementia have memory loss. They may also have problems with thinking, speaking, or solving problems. It can affect how they act around people, how they do their job, their mood, and their personality. These changes may not show up for a long time. Family or friends may not notice problems in the early part of this disease. HOME CARE The following tips are for the person  living with, or caring for, the person with dementia. Make the home safe.  Remove locks on bathroom doors.  Use childproof locks on cabinets where alcohol, cleaning supplies, or chemicals are stored.  Put outlet covers in  electrical outlets.  Put in childproof locks to keep doors and windows safe.  Remove stove knobs, or put in safety knobs that shut off on their own.  Lower the temperature on water heaters.  Label medicines. Lock them in a safe place.  Keep knives, lighters, matches, power tools, and guns out of reach or in a safe place.  Remove objects that might break or can hurt the person.  Make sure lighting is good inside and outside.  Put in grab bars if needed.  Use a device that detects falls or other needs for help. Lessen confusion.  Keep familiar objects and people around.  Use night lights or low lit (dim) lights at night.  Label objects or areas.  Use reminders, notes, or directions for daily activities or tasks.  Keep a simple routine that is the same for waking, meals, bathing, dressing, and bedtime.  Create a calm and quiet home.  Put up clocks and calendars.  Keep emergency numbers and the home address near all phones.  Help show the different times of day. Open the curtains during the day to let light in. Speak clearly and directly.  Choose simple words and short sentences.  Use a gentle, calm voice.  Do not interrupt.  If the person has a hard time finding a word to use, give them the word or thought.  Ask 1 question at a time. Give enough time for the person to answer. Repeat the question if the person does not answer. Do things that lessen restlessness.  Provide a comfortable bed.  Have the same bedtime routine every night.  Have a regular walking and activity schedule.  Lessen naps during the day.  Do not let the person drink a lot of caffeine.  Go to events that are not overwhelming. Eat well and drink fluids.  Lessen distractions during meal times and snacks.  Avoid foods that are too hot or too cold.  Watch how the person chews and swallows. This is to make sure they do not choke. Other  Keep all vision, hearing, dental, and medical  visits with the doctor.  Only give medicines as told by the doctor.  Watch the person's driving ability. Do not let the person drive if he or she cannot drive safely.  Use a program that helps find a person if they become missing. You may need to register with this program. GET HELP RIGHT AWAY IF:   A fever of 102 F (38.9 C) develops.  Confusion develops or gets worse.  Sleepiness develops or gets worse.  Staying awake is hard to do.  New behavior problems start like mood swings, aggression, and seeing things that are not there.  Problems with balance, speech, or falling develop.  Problems swallowing develop.  Any problems of another sickness develop. MAKE SURE YOU:  Understand these instructions.  Will watch his or her condition.  Will get help right away if he or she is not doing well or gets worse. Document Released: 07/05/2008 Document Revised: 10/15/2011 Document Reviewed: 12/18/2010 [facility] Patient Information 2015 [place], [place]. This information is not intended to replace advice given to you by your health care provider. Make sure you discuss any questions you have with your health care provider.

## 2015-02-25 DIAGNOSIS — M25521 Pain in right elbow: Secondary | ICD-10-CM | POA: Diagnosis not present

## 2015-02-25 DIAGNOSIS — I10 Essential (primary) hypertension: Secondary | ICD-10-CM | POA: Diagnosis not present

## 2015-02-25 DIAGNOSIS — I69928 Other speech and language deficits following unspecified cerebrovascular disease: Secondary | ICD-10-CM | POA: Diagnosis not present

## 2015-02-25 DIAGNOSIS — E780 Pure hypercholesterolemia: Secondary | ICD-10-CM | POA: Diagnosis not present

## 2015-02-25 DIAGNOSIS — D649 Anemia, unspecified: Secondary | ICD-10-CM | POA: Diagnosis not present

## 2015-02-25 DIAGNOSIS — F0281 Dementia in other diseases classified elsewhere with behavioral disturbance: Secondary | ICD-10-CM | POA: Diagnosis not present

## 2015-02-25 DIAGNOSIS — Z952 Presence of prosthetic heart valve: Secondary | ICD-10-CM | POA: Diagnosis not present

## 2015-02-25 DIAGNOSIS — N183 Chronic kidney disease, stage 3 (moderate): Secondary | ICD-10-CM | POA: Diagnosis not present

## 2015-03-04 DIAGNOSIS — M25521 Pain in right elbow: Secondary | ICD-10-CM | POA: Diagnosis not present

## 2015-03-08 DIAGNOSIS — E780 Pure hypercholesterolemia: Secondary | ICD-10-CM | POA: Diagnosis not present

## 2015-03-08 DIAGNOSIS — I10 Essential (primary) hypertension: Secondary | ICD-10-CM | POA: Diagnosis not present

## 2015-03-08 DIAGNOSIS — D649 Anemia, unspecified: Secondary | ICD-10-CM | POA: Diagnosis not present

## 2015-03-09 DIAGNOSIS — Z952 Presence of prosthetic heart valve: Secondary | ICD-10-CM | POA: Diagnosis not present

## 2015-03-09 DIAGNOSIS — I69928 Other speech and language deficits following unspecified cerebrovascular disease: Secondary | ICD-10-CM | POA: Diagnosis not present

## 2015-03-09 DIAGNOSIS — E780 Pure hypercholesterolemia: Secondary | ICD-10-CM | POA: Diagnosis not present

## 2015-03-09 DIAGNOSIS — N183 Chronic kidney disease, stage 3 (moderate): Secondary | ICD-10-CM | POA: Diagnosis not present

## 2015-03-09 DIAGNOSIS — F0281 Dementia in other diseases classified elsewhere with behavioral disturbance: Secondary | ICD-10-CM | POA: Diagnosis not present

## 2015-03-09 DIAGNOSIS — I10 Essential (primary) hypertension: Secondary | ICD-10-CM | POA: Diagnosis not present

## 2015-03-09 DIAGNOSIS — D649 Anemia, unspecified: Secondary | ICD-10-CM | POA: Diagnosis not present

## 2015-03-10 DIAGNOSIS — N281 Cyst of kidney, acquired: Secondary | ICD-10-CM | POA: Diagnosis not present

## 2015-03-10 DIAGNOSIS — R7989 Other specified abnormal findings of blood chemistry: Secondary | ICD-10-CM | POA: Diagnosis not present

## 2015-03-21 ENCOUNTER — Ambulatory Visit (INDEPENDENT_AMBULATORY_CARE_PROVIDER_SITE_OTHER): Payer: Medicare Other | Admitting: Neurology

## 2015-03-21 ENCOUNTER — Encounter: Payer: Self-pay | Admitting: Neurology

## 2015-03-21 VITALS — BP 136/66 | HR 67 | Ht 66.0 in | Wt 153.5 lb

## 2015-03-21 DIAGNOSIS — F0390 Unspecified dementia without behavioral disturbance: Secondary | ICD-10-CM | POA: Diagnosis not present

## 2015-03-21 MED ORDER — PHOSPHATIDYLSERINE-DHA-EPA 100-19.5-6.5 MG PO CAPS: 1.0000 | ORAL_CAPSULE | Freq: Every day | ORAL | Status: DC

## 2015-03-21 NOTE — Patient Instructions (Addendum)
We will try a product called Vayacog for the memory. Take one tablet daily. We will follow up in about 6 months.   Fall Prevention and Home Safety Falls cause injuries and can affect all age groups. It is possible to use preventive measures to significantly decrease the likelihood of falls. There are many simple measures which can make your home safer and prevent falls. OUTDOORS  Repair cracks and edges of walkways and driveways.  Remove high doorway thresholds.  Trim shrubbery on the main path into your home.  Have good outside lighting.  Clear walkways of tools, rocks, debris, and clutter.  Check that handrails are not broken and are securely fastened. Both sides of steps should have handrails.  Have leaves, snow, and ice cleared regularly.  Use sand or salt on walkways during winter months.  In the garage, clean up grease or oil spills. BATHROOM  Install night lights.  Install grab bars by the toilet and in the tub and shower.  Use non-skid mats or decals in the tub or shower.  Place a plastic non-slip stool in the shower to sit on, if needed.  Keep floors dry and clean up all water on the floor immediately.  Remove soap buildup in the tub or shower on a regular basis.  Secure bath mats with non-slip, double-sided rug tape.  Remove throw rugs and tripping hazards from the floors. BEDROOMS  Install night lights.  Make sure a bedside light is easy to reach.  Do not use oversized bedding.  Keep a telephone by your bedside.  Have a firm chair with side arms to use for getting dressed.  Remove throw rugs and tripping hazards from the floor. KITCHEN  Keep handles on pots and pans turned toward the center of the stove. Use back burners when possible.  Clean up spills quickly and allow time for drying.  Avoid walking on wet floors.  Avoid hot utensils and knives.  Position shelves so they are not too high or low.  Place commonly used objects within easy  reach.  If necessary, use a sturdy step stool with a grab bar when reaching.  Keep electrical cables out of the way.  Do not use floor polish or wax that makes floors slippery. If you must use wax, use non-skid floor wax.  Remove throw rugs and tripping hazards from the floor. STAIRWAYS  Never leave objects on stairs.  Place handrails on both sides of stairways and use them. Fix any loose handrails. Make sure handrails on both sides of the stairways are as long as the stairs.  Check carpeting to make sure it is firmly attached along stairs. Make repairs to worn or loose carpet promptly.  Avoid placing throw rugs at the top or bottom of stairways, or properly secure the rug with carpet tape to prevent slippage. Get rid of throw rugs, if possible.  Have an electrician put in a light switch at the top and bottom of the stairs. OTHER FALL PREVENTION TIPS  Wear low-heel or rubber-soled shoes that are supportive and fit well. Wear closed toe shoes.  When using a stepladder, make sure it is fully opened and both spreaders are firmly locked. Do not climb a closed stepladder.  Add color or contrast paint or tape to grab bars and handrails in your home. Place contrasting color strips on first and last steps.  Learn and use mobility aids as needed. Install an electrical emergency response system.  Turn on lights to avoid dark areas. Replace  light bulbs that burn out immediately. Get light switches that glow.  Arrange furniture to create clear pathways. Keep furniture in the same place.  Firmly attach carpet with non-skid or double-sided tape.  Eliminate uneven floor surfaces.  Select a carpet pattern that does not visually hide the edge of steps.  Be aware of all pets. OTHER HOME SAFETY TIPS  Set the water temperature for 120 F (48.8 C).  Keep emergency numbers on or near the telephone.  Keep smoke detectors on every level of the home and near sleeping areas. Document Released:  07/13/2002 Document Revised: 01/22/2012 Document Reviewed: 10/12/2011 [facility] Patient Information 2015 [place], [place]. This information is not intended to replace advice given to you by your health care provider. Make sure you discuss any questions you have with your health care provider.

## 2015-03-21 NOTE — Progress Notes (Signed)
Reason for visit: Memory disturbance  Referring physician: Dr. Bishop Dublin [name] is a 81 y.o. male  History of present illness:  Mr. Bryan Moore is a 81 year old left-handed white male with a history of a progressive memory disturbance. The patient had some mild memory issues prior to a left internal capsule stroke that occurred in January 2015. The patient had a sudden decline in his cognitive abilities that has continued to worsen gradually over time. The patient was tried on Aricept and Namenda, he could not tolerate either one of these medications. Blood work that includes a vitamin B12 level and RPR was unremarkable. The patient has had a gait disturbance, he uses a cane for ambulation. The walking has gradually worsened as well, he has had a fall around 02/18/2015, required a trip to the emergency room. The patient has recovered from this. He has a tendency to lean backwards at times. The patient denies any numbness or weakness of the extremities, he denies headaches or pain in the neck or low back. The wife indicates that he often times is more confused in the evenings, he does not consistently recognize her, he often does not recognize his home. He is not clear that his parents have passed away or his siblings have passed away. In general, he eats and sleeps fairly well. The returns to this office for an evaluation. No physical aggression or severe agitation has been noted.  Past Medical History  Diagnosis Date  . Anemia     from AVM's. Requiring transfusion  . Hyperlipemia   . Aortic stenosis     s/p TAVI July 2012  . Glaucoma   . Mitral regurgitation   . Shingles May 2013    right arm residual pain- last outbreak 1 1/2 yrs ago.  Marland Kitchen Hypertension     Not taking Lisinopril due to BP drops low.  . Transfusion history     last 2 yrs ago.  Marland Kitchen GERD (gastroesophageal reflux disease)     RARE - AND NO MEDS  . Cancer     BLADDER CANCER  . Arthritis   . Balance problem     WEAK  ON RIGHT SIDE ( SCOLISOSIS AND HX OF A BACK SURGERY) - BALANCE PROBLEM - "FALLS BACKWARD" - FREQUENT FALLS.  USES CANE WHEN AMBULATING  . Complication of anesthesia     VERY DROWSY FOR HOURS AFTER SURGERY - AND DISORIENTATION  . Heart murmur     PT'S CARDIOLOGIST IS DR. Acie Fredrickson -LAST OFFICE VISIT 03/02/13 IN [facility]  . Dementia     more memory problems from stroke  . Stroke     left internal capsule 1/15  . GI bleed   . Chronic kidney disease   . Hypercholesteremia   . Glaucoma   . Fall     Past Surgical History  Procedure Laterality Date  . Appendectomy    . Cardiac catheterization  10/23/2010    ef 65%  . US echocardiography  08/31/10    EF 55-60%  . Cardiovascular stress test  01/01/2007    EF 64%, NO EVIDENCE OF ISCHEMIA  . Cardiac valve replacement      02/2011([facility])  . Transurethral resection of bladder tumor with gyrus (turbt-gyrus) N/A 04/13/2013    Procedure: TRANSURETHRAL RESECTION OF BLADDER TUMOR WITH GYRUS (TURBT-GYRUS);  Surgeon: [name], MD;  Location: [facility];  Service: Urology;  Laterality: N/A;  . Cystoscopy with stent placement Left 04/13/2013    Procedure:  LEFT URETERAL  STENT PLACEMENT;  Surgeon: [name], MD;  Location: [facility];  Service: Urology;  Laterality: Left;  . Back surgery  2010 OR 2011  . Transurethral resection of bladder tumor N/A 06/04/2013    Procedure: RESTAGING TRANSURETHRAL RESECTION OF BLADDER TUMOR (TURBT), URETERAL CATHERIZATION ;  Surgeon: [name], MD;  [facility];  Service: Urology;  Laterality: N/A;  CYSTO RESTAGING TURBT     . Eye surgery      bilateral cataracts with lens implants  . Cystoscopy w/ retrogrades Bilateral 09/17/2013    Procedure: CYSTOSCOPY WITH BILATERAL RETROGRADE;  Surgeon: [name], MD;  [facility];  Service: Urology;  Laterality: Bilateral;  . Transurethral resection of bladder tumor N/A 09/17/2013    Procedure: TRANSURETHRAL RESECTION OF BLADDER TUMOR WITH MITOMYCIN-C;  Surgeon: [name], MD;   [facility];  Service: Urology;  Laterality: N/A;    Family History  Problem Relation Age of Onset  . COPD Father   . Stroke Father   . Heart attack Brother   . Parkinson's disease Brother     Social history:  reports that he quit smoking about 33 years ago. He has never used smokeless tobacco. He reports that he drinks about 4.2 oz of alcohol per week. He reports that he does not use illicit drugs.  Medications:  Prior to Admission medications   Medication Sig Start Date End Date Taking? Authorizing Provider  acetaminophen (TYLENOL) 500 MG tablet Take 500 mg by mouth every 6 (six) hours as needed for mild pain, moderate pain or headache.   Yes Historical Provider, MD  amLODipine (NORVASC) 5 MG tablet Take 1 tablet (5 mg total) by mouth daily. 11/05/13  Yes Ivan Anchors Love, PA-C  brimonidine (ALPHAGAN P) 0.1 % SOLN Place 1 drop into both eyes 2 (two) times daily.    Yes Historical Provider, MD  cholecalciferol (VITAMIN D) 1000 UNITS tablet Take 1,000 Units by mouth every morning.    Yes Historical Provider, MD  dorzolamide-timolol (COSOPT) 22.3-6.8 MG/ML ophthalmic solution Place 1 drop into both eyes every 12 (twelve) hours.  04/02/11  Yes Historical Provider, MD  iron polysaccharides (NIFEREX) 150 MG capsule Take 150 mg by mouth every morning.    Yes Historical Provider, MD  Multiple Vitamin (MULTIVITAMIN WITH MINERALS) TABS tablet Take 1 tablet by mouth every morning.    Yes Historical Provider, MD  vitamin B-12 (CYANOCOBALAMIN) 1000 MCG tablet Take 1,000 mcg by mouth daily.   Yes Historical Provider, MD      Allergies  Allergen Reactions  . Advil [Ibuprofen] Other (See Comments)    Causes stomach bleeding, need transfusions as result  . Aspirin Other (See Comments)    Causes stomach bleeding, needs transfusion as result  . Penicillins Itching and Swelling  . Atorvastatin     Joint ache  . Namenda [Memantine]     Stomach pain  . Ativan [Lorazepam] Anxiety    He became very  anxious and aggitated    ROS:  Out of a complete 14 system review of symptoms, the patient complains only of the following symptoms, and all other reviewed systems are negative.  Fatigue Heart murmur Shortness of breath, snoring Incontinence of the bladder Easy bruising Feeling cold Joint pain, muscle cramps, aching muscles Memory disturbance, confusion, dizziness Sleepiness  Blood pressure 136/66, pulse 67, height 5\' 6"  (1.676 m), weight 153 lb 8 oz (69.627 kg).  Physical Exam  General: The patient is alert and cooperative at the time of the examination.  Eyes: Pupils are equal, round, and reactive to light. Discs are flat bilaterally.  Neck: The neck is supple, no carotid bruits are noted.  Respiratory: The respiratory examination is clear.  Cardiovascular: The cardiovascular examination reveals a regular rate and rhythm, a grade III/VI systolic murmur at aortic area is noted, a valve click is noted..  Skin: Extremities are with 1+ edema at the ankles.  Neurologic Exam  Mental status: The patient is alert and oriented x 1 at the time of the examination (not oriented to place or date). Mini-Mental Status Examination done today shows a total score of 12/30. The patient is able to name 4 animals in 30 seconds.  Cranial nerves: Facial symmetry is present. There is good sensation of the face to pinprick and soft touch bilaterally. The strength of the facial muscles and the muscles to head turning and shoulder shrug are normal bilaterally. Speech is well enunciated, no aphasia or dysarthria is noted. Extraocular movements are full. Visual fields are full. The tongue is midline, and the patient has symmetric elevation of the soft palate. No obvious hearing deficits are noted.  Motor: The motor testing reveals 5 over 5 strength of all 4 extremities. Good symmetric motor tone is noted throughout.  Sensory: Sensory testing is intact to pinprick, soft touch, vibration sensation, and  position sense on all 4 extremities, with exception of some decrease in vibration sensation on the right foot. No evidence of extinction is noted.  Coordination: Cerebellar testing reveals good finger-nose-finger and heel-to-shin bilaterally. Slight apraxia with the use of the extremities is noted.  Gait and station: Gait is slightly wide-based, unsteady. The patient uses a cane for ambulation. Tandem gait was not attempted. Romberg is negative. No drift is seen.  Reflexes: Deep tendon reflexes are symmetric and normal bilaterally. Toes are downgoing bilaterally.   MRI brain 08/19/13:  IMPRESSION: 1. A subacute nonhemorrhagic infarct involving the medial left basal ganglia measures 18 mm maximally. T2 signal changes are compatible with the subacute time frame. 2. Moderate generalized atrophy and white matter disease. 3. Mild anterior sinus disease. 4. Atherosclerotic changes within the cavernous carotid arteries without stenosis. 5. Mild diffuse small vessel disease. This likely reflects the sequelae of chronic microvascular ischemia.  * MRI scan images were reviewed online. I agree with the written report.   CT head 02/23/15:  IMPRESSION: Generalized atrophy with small vessel chronic ischemic changes of deep cerebral white matter.  Old lacunar infarct at genu of LEFT internal capsule.  No acute intracranial abnormalities.  * CT scan images were reviewed online. I agree with the written report.     Assessment/Plan:  1. Progressive memory disturbance  2. Gait disturbance  3. Cerebrovascular disease  The patient has a progressive memory disturbance, this significant worsened around the time of a stroke event. The patient has a mechanical heart valve, he is unable to go on anticoagulation medications secondary to GI bleeding. The patient has not been able to tolerate any medications for memory. We will try Vyacog at this time. The patient will follow-up through this  office in 6 months. The wife is to contact our office if any issues arise.  Jill Alexanders MD 03/21/2015 7:40 PM  [facility] [address] [place], [place] [address]  Phone [phone] Fax [phone]

## 2015-03-23 DIAGNOSIS — N184 Chronic kidney disease, stage 4 (severe): Secondary | ICD-10-CM | POA: Diagnosis not present

## 2015-03-25 DIAGNOSIS — D649 Anemia, unspecified: Secondary | ICD-10-CM | POA: Diagnosis not present

## 2015-03-29 DIAGNOSIS — D649 Anemia, unspecified: Secondary | ICD-10-CM | POA: Diagnosis not present

## 2015-04-01 DIAGNOSIS — D649 Anemia, unspecified: Secondary | ICD-10-CM | POA: Diagnosis not present

## 2015-04-20 DIAGNOSIS — D649 Anemia, unspecified: Secondary | ICD-10-CM | POA: Diagnosis not present

## 2015-05-06 DIAGNOSIS — Z8551 Personal history of malignant neoplasm of bladder: Secondary | ICD-10-CM | POA: Diagnosis not present

## 2015-05-06 DIAGNOSIS — N3941 Urge incontinence: Secondary | ICD-10-CM | POA: Diagnosis not present

## 2015-05-10 DIAGNOSIS — Z23 Encounter for immunization: Secondary | ICD-10-CM | POA: Diagnosis not present

## 2015-05-27 DIAGNOSIS — J189 Pneumonia, unspecified organism: Secondary | ICD-10-CM | POA: Diagnosis not present

## 2015-07-04 DIAGNOSIS — I10 Essential (primary) hypertension: Secondary | ICD-10-CM | POA: Diagnosis not present

## 2015-07-04 DIAGNOSIS — D649 Anemia, unspecified: Secondary | ICD-10-CM | POA: Diagnosis not present

## 2015-07-04 DIAGNOSIS — E7800 Pure hypercholesterolemia, unspecified: Secondary | ICD-10-CM | POA: Diagnosis not present

## 2015-07-06 DIAGNOSIS — D649 Anemia, unspecified: Secondary | ICD-10-CM | POA: Diagnosis not present

## 2015-07-06 DIAGNOSIS — Z952 Presence of prosthetic heart valve: Secondary | ICD-10-CM | POA: Diagnosis not present

## 2015-07-06 DIAGNOSIS — I69928 Other speech and language deficits following unspecified cerebrovascular disease: Secondary | ICD-10-CM | POA: Diagnosis not present

## 2015-07-06 DIAGNOSIS — F0281 Dementia in other diseases classified elsewhere with behavioral disturbance: Secondary | ICD-10-CM | POA: Diagnosis not present

## 2015-07-06 DIAGNOSIS — E7800 Pure hypercholesterolemia, unspecified: Secondary | ICD-10-CM | POA: Diagnosis not present

## 2015-07-06 DIAGNOSIS — I10 Essential (primary) hypertension: Secondary | ICD-10-CM | POA: Diagnosis not present

## 2015-07-06 DIAGNOSIS — N184 Chronic kidney disease, stage 4 (severe): Secondary | ICD-10-CM | POA: Diagnosis not present

## 2015-08-17 DIAGNOSIS — N184 Chronic kidney disease, stage 4 (severe): Secondary | ICD-10-CM | POA: Diagnosis not present

## 2015-08-30 ENCOUNTER — Ambulatory Visit: Payer: Medicare Other | Admitting: Cardiovascular Disease

## 2015-09-06 ENCOUNTER — Ambulatory Visit (INDEPENDENT_AMBULATORY_CARE_PROVIDER_SITE_OTHER): Payer: Medicare Other | Admitting: Cardiovascular Disease

## 2015-09-06 ENCOUNTER — Encounter: Payer: Self-pay | Admitting: Cardiovascular Disease

## 2015-09-06 VITALS — BP 110/66 | HR 68 | Ht 66.0 in | Wt 161.0 lb

## 2015-09-06 DIAGNOSIS — I359 Nonrheumatic aortic valve disorder, unspecified: Secondary | ICD-10-CM

## 2015-09-06 NOTE — Patient Instructions (Signed)
Medication Instructions:  Your physician recommends that you continue on your current medications as directed. Please refer to the Current Medication list given to you today.   Labwork: None Ordered   Testing/Procedures: None Ordered   Follow-Up: Your physician wants you to follow-up in: 1 year with Dr. Nahser.  You will receive a reminder letter in the mail two months in advance. If you don't receive a letter, please call our office to schedule the follow-up appointment.   If you need a refill on your cardiac medications before your next appointment, please call your pharmacy.   Thank you for choosing [facility]! Cordell Guercio, RN [phone]    

## 2015-09-06 NOTE — Progress Notes (Signed)
[name] Date of Birth  05-Feb-1920       [facility]    [employer] [address], [place], [place]  [address]   [place], [place]  [address] [phone]     [phone]   Fax  [phone]    Fax [phone]  Problem List: 1. Aortic stenosis-status post TAVR,  2012 2. AV formations with recurrent GI bleeds 3. hypertension 4. Hyperlipidemia. 5. Shingles 6. Frequent Falls ( falls backwards) 7. CVA  History of Present Illness:  Pt has been doing well from a cardiac standpoint.  He denies any episodes of chest pain or shortness breath. He has been falling recently and stopped his lisinopril because he thought it was causing him to have some dizziness. He's been falling backwards.  March 02, 2013:  Jarryd is doing well.  He has some dyspnea - especially with exertion.  Still have balance issues.   October 07, 2013:  Lennin has had a stroke since I last saw him.  No CP, no dyspnea.  He was seen with his wife who answered most of the questions  Jan. 31, 2017:  Doing well.  Has slowed down since his stroke Has gained some weight.  Has increased DOE.     No PND or orthopnea .  May be eating at bit of extra salt .   Current Outpatient Prescriptions on File Prior to Visit  Medication Sig Dispense Refill  . acetaminophen (TYLENOL) 500 MG tablet Take 500 mg by mouth every 6 (six) hours as needed for mild pain, moderate pain or headache.    Marland Kitchen amLODipine (NORVASC) 5 MG tablet Take 1 tablet (5 mg total) by mouth daily. 30 tablet 1  . brimonidine (ALPHAGAN P) 0.1 % SOLN Place 1 drop into both eyes 2 (two) times daily.     . cholecalciferol (VITAMIN D) 1000 UNITS tablet Take 1,000 Units by mouth every morning.     . dorzolamide-timolol (COSOPT) 22.3-6.8 MG/ML ophthalmic solution Place 1 drop into both eyes every 12 (twelve) hours.     . iron polysaccharides (NIFEREX) 150 MG capsule Take 150 mg by mouth every morning.     .  Multiple Vitamin (MULTIVITAMIN WITH MINERALS) TABS tablet Take 1 tablet by mouth every morning.     . Phosphatidylserine-DHA-EPA (VAYACOG) 100-19.5-6.5 MG CAPS Take 1 tablet by mouth daily. 30 capsule 5  . vitamin B-12 (CYANOCOBALAMIN) 1000 MCG tablet Take 1,000 mcg by mouth daily.     No current facility-administered medications on file prior to visit.    Allergies  Allergen Reactions  . Advil [Ibuprofen] Other (See Comments)    Causes stomach bleeding, need transfusions as result  . Aspirin Other (See Comments)    Causes stomach bleeding, needs transfusion as result  . Penicillins Itching and Swelling  . Atorvastatin     Joint ache  . Namenda [Memantine]     Stomach pain  . Ativan [Lorazepam] Anxiety    He became very anxious and aggitated    Past Medical History  Diagnosis Date  . Anemia     from AVM's. Requiring transfusion  . Hyperlipemia   . Aortic stenosis     s/p TAVI July 2012  . Glaucoma   . Mitral regurgitation   . Shingles May 2013    right arm residual pain- last outbreak 1 1/2 yrs ago.  Marland Kitchen Hypertension     Not taking Lisinopril due to BP drops  low.  . Transfusion history     last 2 yrs ago.  Marland Kitchen GERD (gastroesophageal reflux disease)     RARE - AND NO MEDS  . Cancer (HCC)     BLADDER CANCER  . Arthritis   . Balance problem     WEAK ON RIGHT SIDE ( SCOLISOSIS AND HX OF A BACK SURGERY) - BALANCE PROBLEM - "FALLS BACKWARD" - FREQUENT FALLS.  USES CANE WHEN AMBULATING  . Complication of anesthesia     VERY DROWSY FOR HOURS AFTER SURGERY - AND DISORIENTATION  . Heart murmur     PT'S CARDIOLOGIST IS DR. Acie Fredrickson -LAST OFFICE VISIT 03/02/13 IN [facility]  . Dementia     more memory problems from stroke  . Stroke [facility])     left internal capsule 1/15  . GI bleed   . Chronic kidney disease   . Hypercholesteremia   . Glaucoma   . Fall     Past Surgical History  Procedure Laterality Date  . Appendectomy    . Cardiac catheterization  10/23/2010    ef 65%  . US  echocardiography  08/31/10    EF 55-60%  . Cardiovascular stress test  01/01/2007    EF 64%, NO EVIDENCE OF ISCHEMIA  . Cardiac valve replacement      02/2011([facility])  . Transurethral resection of bladder tumor with gyrus (turbt-gyrus) N/A 04/13/2013    Procedure: TRANSURETHRAL RESECTION OF BLADDER TUMOR WITH GYRUS (TURBT-GYRUS);  Surgeon: [name], MD;  [facility];  Service: Urology;  Laterality: N/A;  . Cystoscopy with stent placement Left 04/13/2013    Procedure:  LEFT URETERAL STENT PLACEMENT;  Surgeon: [name], MD;  [facility];  Service: Urology;  Laterality: Left;  . Back surgery  2010 OR 2011  . Transurethral resection of bladder tumor N/A 06/04/2013    Procedure: RESTAGING TRANSURETHRAL RESECTION OF BLADDER TUMOR (TURBT), URETERAL CATHERIZATION ;  Surgeon: [name], MD;  [facility];  Service: Urology;  Laterality: N/A;  CYSTO RESTAGING TURBT     . Eye surgery      bilateral cataracts with lens implants  . Cystoscopy w/ retrogrades Bilateral 09/17/2013    Procedure: CYSTOSCOPY WITH BILATERAL RETROGRADE;  Surgeon: [name], MD;  [facility];  Service: Urology;  Laterality: Bilateral;  . Transurethral resection of bladder tumor N/A 09/17/2013    Procedure: TRANSURETHRAL RESECTION OF BLADDER TUMOR WITH MITOMYCIN-C;  Surgeon: [name], MD;  [facility];  Service: Urology;  Laterality: N/A;    History  Smoking status  . Former Smoker  . Quit date: 07/04/1981  Smokeless tobacco  . Never Used    History  Alcohol Use  . 4.2 oz/week  . 7 Glasses of wine per week    Comment: nightly    Family History  Problem Relation Age of Onset  . COPD Father   . Stroke Father   . Heart attack Brother   . Parkinson's disease Brother     Reviw of Systems:  Reviewed in the HPI.  All other systems are negative.  Physical Exam: Blood pressure 110/66, pulse 68, height 5\' 6"  (1.676 m), weight 161 lb (73.029 kg). General: Well developed, well  nourished, in no acute distress.  He has an abrasion in the right side of his forehead.    Head: Normocephalic, atraumatic, sclera non-icteric, mucus membranes are moist,   Neck: Supple. Carotids are 2 + without bruits. No JVD  Lungs: Clear bilaterally to auscultation.  Heart:  regular rate.  normal  S1 S2.  There is a 2 / 6 disastolic murmur c/w AI.  Abdomen: Soft, non-tender, non-distended with normal bowel sounds. No hepatomegaly. No rebound/guarding. No masses.  Msk:  Strength and tone are normal  Extremities: No clubbing or cyanosis. No edema.  Distal pedal pulses are 2+ and equal bilaterally.  Neuro: Alert and oriented X 3. Moves all extremities spontaneously.  Psych:  Responds to questions appropriately with a normal affect.  ECG:  Assessment / Plan:   1. Aortic stenosis-status post TAVR,  2012 His echo in June 2016 shows moderate AI.  This has been stable   2. AV formations with recurrent GI bleeds  3. Hypertension - BP is stable  4. Hyperlipidemia. 5. Shingles 6. Frequent Falls ( falls backwards) 7. CVA 8.   Dyspnea - I dont think that he is volume overloaded I do not want to start lasix  At this point. He's already lightheaded and I worry that this would cause him to be orthostatic. I have recommended that he cut back on his salt intake. Suspect that his shortness of breath is due to be 82 years old.      Jevaun Strick, [name], MD  09/06/2015 4:34 PM    [address],  [place], [place]  [address] Pager [phone] Phone: [phone]; Fax: [phone]   [facility]  [address] [place], [place]  [address] [phone]   Fax [phone]

## 2015-09-22 ENCOUNTER — Ambulatory Visit: Payer: Medicare Other | Admitting: Neurology

## 2015-10-05 DIAGNOSIS — D649 Anemia, unspecified: Secondary | ICD-10-CM | POA: Diagnosis not present

## 2015-10-05 DIAGNOSIS — E7800 Pure hypercholesterolemia, unspecified: Secondary | ICD-10-CM | POA: Diagnosis not present

## 2015-10-05 DIAGNOSIS — I10 Essential (primary) hypertension: Secondary | ICD-10-CM | POA: Diagnosis not present

## 2015-10-07 DIAGNOSIS — Z952 Presence of prosthetic heart valve: Secondary | ICD-10-CM | POA: Diagnosis not present

## 2015-10-07 DIAGNOSIS — N184 Chronic kidney disease, stage 4 (severe): Secondary | ICD-10-CM | POA: Diagnosis not present

## 2015-10-07 DIAGNOSIS — I69928 Other speech and language deficits following unspecified cerebrovascular disease: Secondary | ICD-10-CM | POA: Diagnosis not present

## 2015-10-07 DIAGNOSIS — I10 Essential (primary) hypertension: Secondary | ICD-10-CM | POA: Diagnosis not present

## 2015-10-07 DIAGNOSIS — F0281 Dementia in other diseases classified elsewhere with behavioral disturbance: Secondary | ICD-10-CM | POA: Diagnosis not present

## 2015-10-07 DIAGNOSIS — D649 Anemia, unspecified: Secondary | ICD-10-CM | POA: Diagnosis not present

## 2015-10-07 DIAGNOSIS — E7800 Pure hypercholesterolemia, unspecified: Secondary | ICD-10-CM | POA: Diagnosis not present

## 2015-10-31 DIAGNOSIS — N401 Enlarged prostate with lower urinary tract symptoms: Secondary | ICD-10-CM | POA: Diagnosis not present

## 2015-10-31 DIAGNOSIS — N3941 Urge incontinence: Secondary | ICD-10-CM | POA: Diagnosis not present

## 2015-10-31 DIAGNOSIS — C674 Malignant neoplasm of posterior wall of bladder: Secondary | ICD-10-CM | POA: Diagnosis not present

## 2015-10-31 DIAGNOSIS — Z0000 Encounter for general adult medical examination without abnormal findings: Secondary | ICD-10-CM | POA: Diagnosis not present

## 2015-10-31 DIAGNOSIS — N138 Other obstructive and reflux uropathy: Secondary | ICD-10-CM | POA: Diagnosis not present

## 2015-10-31 DIAGNOSIS — Z8551 Personal history of malignant neoplasm of bladder: Secondary | ICD-10-CM | POA: Diagnosis not present

## 2015-11-03 ENCOUNTER — Other Ambulatory Visit: Payer: Self-pay | Admitting: Urology

## 2015-11-07 NOTE — Patient Instructions (Addendum)
CY BARRISH  11/07/2015   Your procedure is scheduled on: 11-15-15  Report to [facility] Main  Entrance take [facility]  elevators to 3rd floor to  [place] at 845 AM.  Call this number if you have problems the morning of surgery [phone]   Remember: ONLY 1 PERSON MAY GO WITH YOU TO SHORT STAY TO GET  READY MORNING OF [place].  Do not eat food or drink liquids :After Midnight.     Take these medicines the morning of surgery with A SIP OF WATER: AMLODIPINE (NORVASC)             You may not have any metal on your body including hair pins and              piercings  Do not wear jewelry, make-up, lotions, powders or perfumes, deodorant             Do not wear nail polish.  Do not shave  48 hours prior to surgery.              Men may shave face and neck.   Do not bring valuables to the hospital. [place].  Contacts, dentures or bridgework may not be worn into surgery.  Leave suitcase in the car. After surgery it may be brought to your room.                 Please read over the following fact sheets you were given: _____________________________________________________________________             [facility] - Preparing for Surgery Before surgery, you can play an important role.  Because skin is not sterile, your skin needs to be as free of germs as possible.  You can reduce the number of germs on your skin by washing with CHG (chlorahexidine gluconate) soap before surgery.  CHG is an antiseptic cleaner which kills germs and bonds with the skin to continue killing germs even after washing. Please DO NOT use if you have an allergy to CHG or antibacterial soaps.  If your skin becomes reddened/irritated stop using the CHG and inform your nurse when you arrive at Short Stay. Do not shave (including legs and underarms) for at least 48 hours prior to the first CHG shower.  You may shave your  face/neck. Please follow these instructions carefully:  1.  Shower with CHG Soap the night before surgery and the  morning of Surgery.  2.  If you choose to wash your hair, wash your hair first as usual with your  normal  shampoo.  3.  After you shampoo, rinse your hair and body thoroughly to remove the  shampoo.                           4.  Use CHG as you would any other liquid soap.  You can apply chg directly  to the skin and wash                       Gently with a scrungie or clean washcloth.  5.  Apply the CHG Soap to your body ONLY FROM THE NECK DOWN.   Do not use on face/ open  Wound or open sores. Avoid contact with eyes, ears mouth and genitals (private parts).                       Wash face,  Genitals (private parts) with your normal soap.             6.  Wash thoroughly, paying special attention to the area where your surgery  will be performed.  7.  Thoroughly rinse your body with warm water from the neck down.  8.  DO NOT shower/wash with your normal soap after using and rinsing off  the CHG Soap.                9.  Pat yourself dry with a clean towel.            10.  Wear clean pajamas.            11.  Place clean sheets on your bed the night of your first shower and do not  sleep with pets. Day of Surgery : Do not apply any lotions/deodorants the morning of surgery.  Please wear clean clothes to the hospital/surgery center.  FAILURE TO FOLLOW THESE INSTRUCTIONS MAY RESULT IN THE CANCELLATION OF YOUR SURGERY PATIENT SIGNATURE_________________________________  NURSE SIGNATURE__________________________________  ________________________________________________________________________

## 2015-11-07 NOTE — Progress Notes (Signed)
LOV CARDIO 09-06-15 [facility] EKG 02-24-15 [facility] CHEST XRAY 02-23-15 [facility] LOV NEURO 06-16-14 [facility]

## 2015-11-08 ENCOUNTER — Encounter (HOSPITAL_COMMUNITY): Payer: Self-pay

## 2015-11-08 ENCOUNTER — Encounter (HOSPITAL_COMMUNITY)
Admission: RE | Admit: 2015-11-08 | Discharge: 2015-11-08 | Disposition: A | Discharge status: Home / Self Care | Payer: Medicare Other | Source: Ambulatory Visit | Attending: Urology | Admitting: Urology

## 2015-11-08 DIAGNOSIS — D494 Neoplasm of unspecified behavior of bladder: Secondary | ICD-10-CM | POA: Insufficient documentation

## 2015-11-08 DIAGNOSIS — Z01812 Encounter for preprocedural laboratory examination: Secondary | ICD-10-CM | POA: Diagnosis not present

## 2015-11-08 HISTORY — DX: Other specified postprocedural states: Z98.890

## 2015-11-08 HISTORY — DX: Personal history of other medical treatment: Z92.89

## 2015-11-08 HISTORY — DX: Nausea with vomiting, unspecified: R11.2

## 2015-11-08 LAB — CBC
HEMATOCRIT: 35.1 % — AB (ref 39.0–52.0)
Hemoglobin: 11.6 g/dL — ABNORMAL LOW (ref 13.0–17.0)
MCH: 31.8 pg (ref 26.0–34.0)
MCHC: 33 g/dL (ref 30.0–36.0)
MCV: 96.2 fL (ref 78.0–100.0)
PLATELETS: 169 10*3/uL (ref 150–400)
RBC: 3.65 MIL/uL — ABNORMAL LOW (ref 4.22–5.81)
RDW: 14.6 % (ref 11.5–15.5)
WBC: 4.2 10*3/uL (ref 4.0–10.5)

## 2015-11-08 LAB — BASIC METABOLIC PANEL
Anion gap: 9 (ref 5–15)
BUN: 27 mg/dL — AB (ref 6–20)
CHLORIDE: 108 mmol/L (ref 101–111)
CO2: 26 mmol/L (ref 22–32)
CREATININE: 1.65 mg/dL — AB (ref 0.61–1.24)
Calcium: 9.3 mg/dL (ref 8.9–10.3)
GFR calc Af Amer: 40 mL/min — ABNORMAL LOW (ref >60)
GFR, EST NON AFRICAN AMERICAN: 34 mL/min — AB (ref >60)
GLUCOSE: 91 mg/dL (ref 65–99)
POTASSIUM: 4.7 mmol/L (ref 3.5–5.1)
SODIUM: 143 mmol/L (ref 135–145)

## 2015-11-08 NOTE — Progress Notes (Signed)
BMET RESULTS FAXED BY EPIC TO DR [facility]

## 2015-11-10 NOTE — Progress Notes (Signed)
ECHO 01-14-15 REQUESTED FROM [facility], NEVER RECEIVED.

## 2015-11-14 NOTE — H&P (Signed)
Active Problems Problems  1. Benign prostatic hypertrophy without lower urinary tract symptoms (N40.0) 2. Congenital renal cyst (Q61.00) 3. Elevated BUN (R79.9) 4. Elevated serum creatinine (R79.89) 5. Erectile dysfunction due to arterial insufficiency (N52.01) 6. History of malignant neoplasm of bladder (Z85.51) 7. Increased urinary frequency (R35.0) 8. Malignant neoplasm of trigone of bladder (C67.0) 9. Nodular prostate without lower urinary tract symptoms (N40.2) 10. Pyuria (N39.0) 11. Renal cysts, acquired, bilateral (N28.1) 12. Testicular atrophy (N50.0) 13. Urge incontinence of urine (N39.41)  History of Present Illness Bryan Moore returns today in f/u. He has a history of HG T1 NMIBC and completed induction BCG in March 2015.  He is voiding well without hematuria or dysuria. He continues to have some UUI without an anticipatory urge but he manages well with a urinal at the bedside..   Past Medical History Problems  1. History of Elevated prostate specific antigen (PSA) (R97.20) 2. History of anemia (Z86.2) 3. History of gastrointestinal hemorrhage (Z87.19) 4. History of glaucoma (Z86.69) 5. History of herpes zoster (Z86.19) 6. History of hypertension (Z86.79) 7. History of malignant neoplasm of bladder (Z85.51) 8. History of Hydronephrosis, left (N13.30) 9. History of Incomplete bladder emptying (R33.9) 10. History of Microscopic hematuria (R31.29) 11. History of Urinary retention (R33.9)  Surgical History Problems  1. History of Aortic Valve Replacement 2. History of Appendectomy 3. History of Back Surgery 4. History of Bladder Injection Of Cancer Treatment 5. History of Cystoscopy With Fulguration Large Lesion (Over 5cm) 6. History of Cystoscopy With Fulguration Large Lesion (Over 5cm) 7. History of Cystoscopy With Fulguration Medium Lesion (2-5cm) 8. History of Cystoscopy With Insertion Of Ureteral Stent Left 9. History of Cystoscopy With Ureteral  Catheterization  Current Meds 1. Alphagan P 0.1 % Ophthalmic Solution;  Therapy: [id number] to Recorded 2. AmLODIPine Besylate 5 MG Oral Tablet;  Therapy: 23Jan2015 to Recorded 3. Atorvastatin Calcium 10 MG Oral Tablet;  Therapy: 23Jan2015 to Recorded 4. Brimonidine Tartrate 0.15 % Ophthalmic Solution;  Therapy: [id number] to Recorded 5. Dorzolamide HCl-Timolol Mal 22.3-6.8 MG/ML Ophthalmic Solution;  Therapy: [id number] to Recorded 6. Iron TABS;  Therapy: (Recorded:01Jun2015) to Recorded  Allergies Medication  1. Penicillins 2. Septra DS TABS 3. sulfa  Family History Problems  1. Family history of Family Health Status Number Of Children   1 son; 1 daughter 2. No pertinent family history : Sibling  Social History Problems  1. Alcohol Use (History)   Occ glass of wine. 2. Caffeine Use   drinks coffee 3. Former smoker [phone] 4. Marital History - Currently Married 5. Occupation:   retired 24. Denied: Tobacco Use  Past and social history reviewed and updated.   Review of Systems  Constitutional: no recent weight loss.  Musculoskeletal: limb pain (cramps at night. ).  Neurological: memory loss (progressive).    Vitals Vital Signs [Data Includes: Last 1 Day]  Recorded: 27Mar2017 03:38PM  Blood Pressure: 146 / 72 Temperature: 97.9 F Heart Rate: 67  Physical Exam Constitutional: Well nourished and well developed . No acute distress.  Pulmonary: No respiratory distress and normal respiratory rhythm and effort.  Cardiovascular: Heart rate and rhythm are normal . No peripheral edema.    Procedure  Procedure: Cystoscopy   Indication: History of Urothelial Carcinoma.  Informed Consent: Risks, benefits, and potential adverse events were discussed and informed consent was obtained from the patient.  Prep: The patient was prepped with betadine.  Antibiotic prophylaxis: Ciprofloxacin.  Procedure Note:  Urethral meatus:. No abnormalities.  Anterior urethra: No  abnormalities.  Prostatic  urethra: No abnormalities . Estimated length was 4 cm. There was visual obstruction of the prostatic urethra. The lateral prostatic lobes were enlarged. No intravesical median lobe was visualized.  Bladder: Visulization was clear. The ureteral orifices were in the normal anatomic position bilaterally and had clear efflux of urine. A systematic survey of the bladder demonstrated no bladder tumors or stones. Examination of the bladder demonstrated mild trabeculation no erythematous mucosa (There is a 2cm area on the posterior wall that looks consistent with CIS). The patient tolerated the procedure well.  Complications: None.    Assessment Assessed  1. Malignant neoplasm of posterior wall of bladder (C67.4) 2. Benign prostatic hyperplasia with urinary obstruction (N40.1,N13.8) 3. Urge incontinence of urine (N39.41)  Bryan Moore appears to have a recurrence of probable CIS on the posterior wall   Plan Health Maintenance  1. UA With REFLEX; [Do Not Release]; Status:In Progress - Specimen/Data Collected;    Done: [id number] PMH: History of malignant neoplasm of bladder  2. URINE CYTOLOGY; Status:Hold For - Specimen/Data Collection,Appointment;  Requested for:27Mar2017;   I am going to get him set up for cystoscopy with bilateral retrograde and bladder biopsy with fulguration.  He may need repeat BCG.  I have reviewed the risks in detail and will plan to keep him overnight because of his age and prior postop voiding issues.   UA and cytology ordered for today.

## 2015-11-15 ENCOUNTER — Ambulatory Visit (HOSPITAL_COMMUNITY): Payer: Medicare Other | Admitting: Certified Registered"

## 2015-11-15 ENCOUNTER — Observation Stay (HOSPITAL_COMMUNITY)
Admission: RE | Admit: 2015-11-15 | Discharge: 2015-11-16 | Disposition: A | Discharge status: Home / Self Care | Payer: Medicare Other | Source: Ambulatory Visit | Attending: Urology | Admitting: Urology

## 2015-11-15 ENCOUNTER — Encounter (HOSPITAL_COMMUNITY)
Admission: RE | Disposition: A | Discharge status: Home / Self Care | Payer: Self-pay | Source: Ambulatory Visit | Attending: Urology

## 2015-11-15 ENCOUNTER — Encounter (HOSPITAL_COMMUNITY): Payer: Self-pay | Admitting: *Deleted

## 2015-11-15 DIAGNOSIS — N4289 Other specified disorders of prostate: Secondary | ICD-10-CM | POA: Insufficient documentation

## 2015-11-15 DIAGNOSIS — R3915 Urgency of urination: Secondary | ICD-10-CM | POA: Diagnosis not present

## 2015-11-15 DIAGNOSIS — N189 Chronic kidney disease, unspecified: Secondary | ICD-10-CM | POA: Insufficient documentation

## 2015-11-15 DIAGNOSIS — D494 Neoplasm of unspecified behavior of bladder: Secondary | ICD-10-CM | POA: Diagnosis not present

## 2015-11-15 DIAGNOSIS — Z952 Presence of prosthetic heart valve: Secondary | ICD-10-CM | POA: Insufficient documentation

## 2015-11-15 DIAGNOSIS — N401 Enlarged prostate with lower urinary tract symptoms: Secondary | ICD-10-CM | POA: Diagnosis not present

## 2015-11-15 DIAGNOSIS — N138 Other obstructive and reflux uropathy: Secondary | ICD-10-CM | POA: Insufficient documentation

## 2015-11-15 DIAGNOSIS — F0390 Unspecified dementia without behavioral disturbance: Secondary | ICD-10-CM | POA: Insufficient documentation

## 2015-11-15 DIAGNOSIS — Z87891 Personal history of nicotine dependence: Secondary | ICD-10-CM | POA: Insufficient documentation

## 2015-11-15 DIAGNOSIS — H409 Unspecified glaucoma: Secondary | ICD-10-CM | POA: Diagnosis not present

## 2015-11-15 DIAGNOSIS — I340 Nonrheumatic mitral (valve) insufficiency: Secondary | ICD-10-CM | POA: Diagnosis not present

## 2015-11-15 DIAGNOSIS — M1990 Unspecified osteoarthritis, unspecified site: Secondary | ICD-10-CM | POA: Insufficient documentation

## 2015-11-15 DIAGNOSIS — I129 Hypertensive chronic kidney disease with stage 1 through stage 4 chronic kidney disease, or unspecified chronic kidney disease: Secondary | ICD-10-CM | POA: Insufficient documentation

## 2015-11-15 DIAGNOSIS — E7800 Pure hypercholesterolemia, unspecified: Secondary | ICD-10-CM | POA: Insufficient documentation

## 2015-11-15 DIAGNOSIS — N329 Bladder disorder, unspecified: Secondary | ICD-10-CM

## 2015-11-15 DIAGNOSIS — D649 Anemia, unspecified: Secondary | ICD-10-CM | POA: Insufficient documentation

## 2015-11-15 DIAGNOSIS — N3941 Urge incontinence: Secondary | ICD-10-CM | POA: Diagnosis present

## 2015-11-15 DIAGNOSIS — Z8673 Personal history of transient ischemic attack (TIA), and cerebral infarction without residual deficits: Secondary | ICD-10-CM | POA: Insufficient documentation

## 2015-11-15 DIAGNOSIS — Z79899 Other long term (current) drug therapy: Secondary | ICD-10-CM | POA: Insufficient documentation

## 2015-11-15 DIAGNOSIS — N362 Urethral caruncle: Secondary | ICD-10-CM | POA: Diagnosis not present

## 2015-11-15 DIAGNOSIS — N3289 Other specified disorders of bladder: Secondary | ICD-10-CM | POA: Diagnosis not present

## 2015-11-15 DIAGNOSIS — Z8551 Personal history of malignant neoplasm of bladder: Secondary | ICD-10-CM | POA: Diagnosis not present

## 2015-11-15 HISTORY — PX: CYSTOSCOPY W/ RETROGRADES: SHX1426

## 2015-11-15 SURGERY — CYSTOSCOPY, WITH RETROGRADE PYELOGRAM
Anesthesia: General | Site: Bladder | Laterality: Bilateral

## 2015-11-15 MED ORDER — EPHEDRINE SULFATE 50 MG/ML IJ SOLN
INTRAMUSCULAR | Status: DC | PRN
Start: 2015-11-15 — End: 2015-11-15
  Administered 2015-11-15: 10 mg via INTRAVENOUS

## 2015-11-15 MED ORDER — SODIUM CHLORIDE 0.9 % IR SOLN
Status: DC | PRN
  Administered 2015-11-15: 1000 mL via INTRAVESICAL

## 2015-11-15 MED ORDER — PROPOFOL 10 MG/ML IV BOLUS
INTRAVENOUS | Status: AC
  Filled 2015-11-15: qty 20

## 2015-11-15 MED ORDER — HYDROCODONE-ACETAMINOPHEN 5-325 MG PO TABS: 1.0000 | ORAL_TABLET | ORAL | Status: DC | PRN

## 2015-11-15 MED ORDER — BISACODYL 10 MG RE SUPP: 10.0000 mg | Freq: Every day | RECTAL | Status: DC | PRN

## 2015-11-15 MED ORDER — KCL IN DEXTROSE-NACL 20-5-0.45 MEQ/L-%-% IV SOLN
INTRAVENOUS | Status: DC
  Administered 2015-11-15: 21:00:00 via INTRAVENOUS
  Filled 2015-11-15 (×2): qty 1000

## 2015-11-15 MED ORDER — IOPAMIDOL (ISOVUE-300) INJECTION 61%
INTRAVENOUS | Status: AC
  Filled 2015-11-15: qty 50

## 2015-11-15 MED ORDER — SENNOSIDES-DOCUSATE SODIUM 8.6-50 MG PO TABS: 1.0000 | ORAL_TABLET | Freq: Every evening | ORAL | Status: DC | PRN

## 2015-11-15 MED ORDER — LIDOCAINE HCL (CARDIAC) 20 MG/ML IV SOLN
INTRAVENOUS | Status: AC
  Filled 2015-11-15: qty 5

## 2015-11-15 MED ORDER — FENTANYL CITRATE (PF) 100 MCG/2ML IJ SOLN
INTRAMUSCULAR | Status: AC
  Filled 2015-11-15: qty 2

## 2015-11-15 MED ORDER — BELLADONNA ALKALOIDS-OPIUM 16.2-60 MG RE SUPP
RECTAL | Status: AC
  Filled 2015-11-15: qty 1

## 2015-11-15 MED ORDER — ACETAMINOPHEN 325 MG PO TABS: 650.0000 mg | ORAL_TABLET | ORAL | Status: DC | PRN

## 2015-11-15 MED ORDER — FENTANYL CITRATE (PF) 100 MCG/2ML IJ SOLN
INTRAMUSCULAR | Status: DC | PRN
  Administered 2015-11-15: 50 ug via INTRAVENOUS

## 2015-11-15 MED ORDER — MIDAZOLAM HCL 2 MG/2ML IJ SOLN
INTRAMUSCULAR | Status: AC
  Filled 2015-11-15: qty 2

## 2015-11-15 MED ORDER — HYOSCYAMINE SULFATE 0.125 MG SL SUBL
0.1250 mg | SUBLINGUAL_TABLET | SUBLINGUAL | Status: DC | PRN
  Filled 2015-11-15: qty 1

## 2015-11-15 MED ORDER — HYDROMORPHONE HCL 1 MG/ML IJ SOLN: 0.2500 mg | INTRAMUSCULAR | Status: DC | PRN

## 2015-11-15 MED ORDER — LIDOCAINE HCL (CARDIAC) 20 MG/ML IV SOLN
INTRAVENOUS | Status: DC | PRN
  Administered 2015-11-15: 50 mg via INTRATRACHEAL

## 2015-11-15 MED ORDER — AMLODIPINE BESYLATE 5 MG PO TABS
5.0000 mg | ORAL_TABLET | Freq: Every day | ORAL | Status: DC
Start: 2015-11-16 — End: 2015-11-16
  Filled 2015-11-15: qty 1

## 2015-11-15 MED ORDER — DEXAMETHASONE SODIUM PHOSPHATE 10 MG/ML IJ SOLN
INTRAMUSCULAR | Status: DC | PRN
  Administered 2015-11-15: 10 mg via INTRAVENOUS

## 2015-11-15 MED ORDER — CEFAZOLIN SODIUM-DEXTROSE 2-4 GM/100ML-% IV SOLN
2.0000 g | Freq: Once | INTRAVENOUS | Status: AC
  Administered 2015-11-15: 2 g via INTRAVENOUS
  Filled 2015-11-15: qty 100

## 2015-11-15 MED ORDER — ZOLPIDEM TARTRATE 5 MG PO TABS
5.0000 mg | ORAL_TABLET | Freq: Every evening | ORAL | Status: DC | PRN
  Administered 2015-11-15: 5 mg via ORAL
  Filled 2015-11-15: qty 1

## 2015-11-15 MED ORDER — PROPOFOL 10 MG/ML IV BOLUS
INTRAVENOUS | Status: DC | PRN
  Administered 2015-11-15: 50 mg via INTRAVENOUS
  Administered 2015-11-15: 100 mg via INTRAVENOUS

## 2015-11-15 MED ORDER — ONDANSETRON HCL 4 MG/2ML IJ SOLN: 4.0000 mg | INTRAMUSCULAR | Status: DC | PRN

## 2015-11-15 MED ORDER — LACTATED RINGERS IV SOLN
INTRAVENOUS | Status: DC
  Administered 2015-11-15: 1000 mL via INTRAVENOUS
  Administered 2015-11-15: 13:00:00 via INTRAVENOUS

## 2015-11-15 MED ORDER — MORPHINE SULFATE (PF) 2 MG/ML IV SOLN: 2.0000 mg | INTRAVENOUS | Status: DC | PRN

## 2015-11-15 MED ORDER — LIDOCAINE HCL 2 % EX GEL
CUTANEOUS | Status: AC
  Filled 2015-11-15: qty 5

## 2015-11-15 MED ORDER — IOPAMIDOL (ISOVUE-300) INJECTION 61%
INTRAVENOUS | Status: DC | PRN
  Administered 2015-11-15: 30 mL via URETHRAL

## 2015-11-15 MED ORDER — DEXAMETHASONE SODIUM PHOSPHATE 10 MG/ML IJ SOLN
INTRAMUSCULAR | Status: AC
  Filled 2015-11-15: qty 1

## 2015-11-15 MED ORDER — STERILE WATER FOR IRRIGATION IR SOLN
Status: DC | PRN
  Administered 2015-11-15: 1000 mL

## 2015-11-15 MED ORDER — DIPHENHYDRAMINE HCL 50 MG/ML IJ SOLN: 12.5000 mg | Freq: Four times a day (QID) | INTRAMUSCULAR | Status: DC | PRN

## 2015-11-15 MED ORDER — ONDANSETRON HCL 4 MG/2ML IJ SOLN
INTRAMUSCULAR | Status: DC | PRN
  Administered 2015-11-15: 4 mg via INTRAVENOUS

## 2015-11-15 MED ORDER — ONDANSETRON HCL 4 MG/2ML IJ SOLN
INTRAMUSCULAR | Status: AC
  Filled 2015-11-15: qty 2

## 2015-11-15 MED ORDER — DIPHENHYDRAMINE HCL 12.5 MG/5ML PO ELIX: 12.5000 mg | ORAL_SOLUTION | Freq: Four times a day (QID) | ORAL | Status: DC | PRN

## 2015-11-15 MED ORDER — CEFAZOLIN SODIUM-DEXTROSE 2-4 GM/100ML-% IV SOLN
INTRAVENOUS | Status: AC
  Filled 2015-11-15: qty 100

## 2015-11-15 SURGICAL SUPPLY — 25 items
BAG URO CATCHER STRL LF (MISCELLANEOUS) ×3 IMPLANT
BASKET LASER NITINOL 1.9FR (BASKET) IMPLANT
BASKET STONE NCOMPASS (UROLOGICAL SUPPLIES) IMPLANT
BSKT STON RTRVL 120 1.9FR (BASKET)
CATH URET 5FR 28IN OPEN ENDED (CATHETERS) IMPLANT
CATH URET DUAL LUMEN 6-10FR 50 (CATHETERS) ×3 IMPLANT
CLOTH BEACON ORANGE TIMEOUT ST (SAFETY) ×3 IMPLANT
FIBER LASER FLEXIVA 1000 (UROLOGICAL SUPPLIES) IMPLANT
FIBER LASER FLEXIVA 200 (UROLOGICAL SUPPLIES) IMPLANT
FIBER LASER FLEXIVA 365 (UROLOGICAL SUPPLIES) IMPLANT
FIBER LASER FLEXIVA 550 (UROLOGICAL SUPPLIES) IMPLANT
GLOVE SURG SS PI 8.0 STRL IVOR (GLOVE) ×2 IMPLANT
GOWN STRL REUS W/TWL XL LVL3 (GOWN DISPOSABLE) ×3 IMPLANT
GUIDEWIRE STR DUAL SENSOR (WIRE) ×3 IMPLANT
IV NS 1000ML (IV SOLUTION) ×3
IV NS 1000ML BAXH (IV SOLUTION) ×1 IMPLANT
IV NS IRRIG 3000ML ARTHROMATIC (IV SOLUTION) ×3 IMPLANT
MANIFOLD NEPTUNE II (INSTRUMENTS) ×3 IMPLANT
NDL SAFETY ECLIPSE 18X1.5 (NEEDLE) IMPLANT
NEEDLE HYPO 18GX1.5 SHARP (NEEDLE) ×3
PACK CYSTO (CUSTOM PROCEDURE TRAY) ×3 IMPLANT
SHEATH ACCESS URETERAL 38CM (SHEATH) ×3 IMPLANT
SHEATH URET ACCESS 10/12FR (MISCELLANEOUS) IMPLANT
TUBING CONNECTING 10 (TUBING) ×2 IMPLANT
TUBING CONNECTING 10' (TUBING) ×1

## 2015-11-15 NOTE — Op Note (Signed)
NAME:  MERLAND, ALDANA NO.:  [id number]  MEDICAL RECORD NO.:  [id number]  LOCATION:  [id number]                         FACILITY:  [facility]  PHYSICIAN:  Marshall Cork. [name], M.D.    DATE OF BIRTH:  10/29/1919  DATE OF PROCEDURE:  11/15/2015 DATE OF DISCHARGE:                              OPERATIVE REPORT   PROCEDURE: 1. Cystoscopy with bilateral retrograde pyelograms interpretation. 2. Cup biopsies of bladder and prostatic urethra with fulguration.  PREOPERATIVE DIAGNOSIS:  History of bladder tumor with bladder lesion on recent office cystoscopy.  POSTOPERATIVE DIAGNOSIS:  History of bladder tumor with bladder lesion on recent office cystoscopy with a small lesion in the prostatic urethra and normal retrogrades.  SURGEON:  Marshall Cork. [name], MD  ANESTHESIA:  General.  SPECIMEN:  Cup biopsies from posterior bladder wall and prostatic urethra.  DRAINS:  None.  BLOOD LOSS:  None.  COMPLICATIONS:  None.  INDICATIONS:  Mr. Danielewicz is a 82 year old white male with a history of urothelial carcinoma of the bladder with recurrences, who was recently found on surveillance cystoscopy to have a suspicious area on the anterior bladder wall.  He had a negative cytology following that visit.  It was felt that cysto-bilateral retrograde pyelography and bladder biopsy was indicated.  FINDINGS OF PROCEDURE:  He was given Ancef.  He was taken to the operating room where general anesthetic was induced.  He was placed in lithotomy position.  His perineum and genitalia were prepped with Betadine solution and he was draped in usual sterile fashion.  He had been fitted with PAS hose prior to prep and drape.  Cystoscopy was performed using a 23-French scope and the 30-degree lens. Examination revealed a normal urethra.  The external sphincter was intact.  The prostatic urethra had bilobar hyperplasia with some degree of obstruction.  He had evidence of partial resection of the  very proximal prostatic urethra and there was a small papillary lesion on the left lateral lobe proximally.  Inspection of the bladder revealed moderate severe trabeculation. Ureteral orifices were slightly laterally misplaced with evidence of prior resection and very gaping ureteral orifices particularly on the left.  Inspection of the bladder wall revealed the scar from prior resection on the left lateral wall and also on the right trigone.  He had some blebing of the posterior wall, but the previously noted lesion from office cystoscopy was not readily apparent.  At this point, bilateral retrograde pyelography was performed using a 5- [place] open-end catheter and Isovue.  He was noted to have a normal right ureter and intrarenal collecting system.  He was noted to have a normal left ureter and intrarenal collecting system.  After completion of the bilateral retrograde pyelograms, a cup biopsy forceps was used to biopsy one of the areas of blebing on the posterior wall and the lesion on the left lateral prostatic lobe.  The biopsy sites were then fulgurated with a Bugbee electrode.  Once hemostasis was achieved, the bladder was drained.  The patient was taken down from lithotomy position.  His anesthetic was reversed.  He was moved to recovery room in stable condition.  There were no complications.     Jenny Reichmann  Keene Breath, M.D.     JJW/MEDQ  D:  11/15/2015  T:  11/15/2015  Job:  [id number]

## 2015-11-15 NOTE — Anesthesia Procedure Notes (Signed)
Procedure Name: LMA Insertion Date/Time: 11/15/2015 11:30 AM Performed by: Pilar Grammes Pre-anesthesia Checklist: Patient identified Patient Re-evaluated:Patient Re-evaluated prior to inductionOxygen Delivery Method: Circle system utilized Preoxygenation: Pre-oxygenation with 100% oxygen Ventilation: Mask ventilation without difficulty LMA: LMA inserted LMA Size: 5.0 Number of attempts: 1

## 2015-11-15 NOTE — Brief Op Note (Signed)
11/15/2015  11:59 AM  PATIENT:  Bryan Moore  82 y.o. male  PRE-OPERATIVE DIAGNOSIS:  BLADDER TUMOR  POST-OPERATIVE DIAGNOSIS:  BLADDER TUMOR  PROCEDURE:  Procedure(s): CYSTO, BILATERAL RETROGRADE PYELOGRAM WITH BLADDER BIOPSY (Bilateral)  SURGEON:  Surgeon(s) and Role:    * [name], MD - Primary  PHYSICIAN ASSISTANT:   ASSISTANTS: none   ANESTHESIA:   general  EBL:  Total I/O In: -  Out: 5 [Blood:5]  BLOOD ADMINISTERED:none  DRAINS: none   LOCAL MEDICATIONS USED:  NONE  SPECIMEN:  Source of Specimen:  bladder and prostate urethral biopsies.   DISPOSITION OF SPECIMEN:  PATHOLOGY  COUNTS:  YES  TOURNIQUET:  * No tourniquets in log *  DICTATION: .Other Dictation: Dictation Number [id number]  PLAN OF CARE: Discharge to home after PACU  PATIENT DISPOSITION:  PACU - hemodynamically stable.   Delay start of Pharmacological VTE agent (>24hrs) due to surgical blood loss or risk of bleeding: not applicable

## 2015-11-15 NOTE — Interval H&P Note (Signed)
History and Physical Interval Note:  11/15/2015 11:00 AM  Bryan Moore  has presented today for surgery, with the diagnosis of BLADDER TUMOR  The various methods of treatment have been discussed with the patient and family. After consideration of risks, benefits and other options for treatment, the patient has consented to  Procedure(s): CYSTO, BILATERAL RETROGRADE PYELOGRAM WITH BLADDER BIOPSY (Bilateral) as a surgical intervention .  The patient's history has been reviewed, patient examined, no change in status, stable for surgery.  I have reviewed the patient's chart and labs.  Questions were answered to the patient's satisfaction.     Girtrude Enslin J

## 2015-11-15 NOTE — Transfer of Care (Signed)
Immediate Anesthesia Transfer of Care Note  Patient: Bryan Moore  Procedure(s) Performed: Procedure(s): CYSTO, BILATERAL RETROGRADE PYELOGRAM WITH BLADDER BIOPSY (Bilateral)  Patient Location: PACU  Anesthesia Type:General  Level of Consciousness: sedated, confused and responds to stimulation  Airway & Oxygen Therapy: Patient connected to face mask oxygen  Post-op Assessment: Post -op Vital signs reviewed and stable  Post vital signs: stable  Last Vitals:  Filed Vitals:   11/15/15 0847  BP: 113/82  Pulse: 60  Temp: 36.4 C  Resp: 16    Complications: No apparent anesthesia complications

## 2015-11-15 NOTE — Anesthesia Preprocedure Evaluation (Addendum)
Anesthesia Evaluation  Patient identified by MRN, date of birth, ID band Patient awake    Reviewed: Allergy & Precautions, NPO status , Patient's Chart, lab work & pertinent test results  History of Anesthesia Complications (+) PONV and history of anesthetic complications  Airway Mallampati: II  TM Distance: >3 FB Neck ROM: Full    Dental  (+) Poor Dentition, Dental Advisory Given   Pulmonary neg pulmonary ROS, former smoker,    Pulmonary exam normal        Cardiovascular hypertension, Pt. on medications negative cardio ROS Normal cardiovascular exam     Neuro/Psych Dementia CVA negative psych ROS   GI/Hepatic Neg liver ROS, GERD  ,  Endo/Other  negative endocrine ROS  Renal/GU Renal InsufficiencyRenal disease  negative genitourinary   Musculoskeletal negative musculoskeletal ROS (+)   Abdominal   Peds negative pediatric ROS (+)  Hematology negative hematology ROS (+)   Anesthesia Other Findings   Reproductive/Obstetrics negative OB ROS                            Anesthesia Physical Anesthesia Plan  ASA: III  Anesthesia Plan: General   Post-op Pain Management:    Induction: Intravenous  Airway Management Planned: LMA  Additional Equipment:   Intra-op Plan:   Post-operative Plan: Extubation in OR  Informed Consent: I have reviewed the patients History and Physical, chart, labs and discussed the procedure including the risks, benefits and alternatives for the proposed anesthesia with the patient or authorized representative who has indicated his/her understanding and acceptance.   Dental advisory given and Consent reviewed with POA  Plan Discussed with: CRNA and Anesthesiologist  Anesthesia Plan Comments:         Anesthesia Quick Evaluation

## 2015-11-15 NOTE — Anesthesia Postprocedure Evaluation (Signed)
Anesthesia Post Note  Patient: Bryan Moore  Procedure(s) Performed: Procedure(s) (LRB): CYSTO, BILATERAL RETROGRADE PYELOGRAM WITH BLADDER BIOPSY (Bilateral)  Patient location during evaluation: PACU Anesthesia Type: General Level of consciousness: sedated Pain management: pain level controlled Vital Signs Assessment: post-procedure vital signs reviewed and stable Respiratory status: spontaneous breathing and respiratory function stable Cardiovascular status: stable Anesthetic complications: no    Last Vitals:  Filed Vitals:   11/15/15 1230 11/15/15 1245  BP: 137/57 155/59  Pulse: 51 57  Temp:    Resp: 13 14    Last Pain:  Filed Vitals:   11/15/15 1256  PainSc: 0-No pain                 Pike Scantlebury DANIEL

## 2015-11-16 ENCOUNTER — Encounter (HOSPITAL_COMMUNITY): Payer: Self-pay | Admitting: Urology

## 2015-11-16 DIAGNOSIS — N329 Bladder disorder, unspecified: Secondary | ICD-10-CM

## 2015-11-16 DIAGNOSIS — N3289 Other specified disorders of bladder: Secondary | ICD-10-CM | POA: Diagnosis not present

## 2015-11-16 NOTE — Discharge Instructions (Signed)

## 2015-11-16 NOTE — Discharge Summary (Signed)
Physician Discharge Summary  Patient ID: Bryan Moore MRN: [id number] DOB/AGE: [birth year] 82 y.o.  Admit date: 11/15/2015 Discharge date: 11/16/2015  Admission Diagnoses:  Hypervascular lesion of urinary bladder  Discharge Diagnoses:  Principal Problem:   Hypervascular lesion of urinary bladder Active Problems:   Hx of bladder cancer   Past Medical History  Diagnosis Date  . Anemia     from AVM's. Requiring transfusion  . Hyperlipemia   . Aortic stenosis     s/p TAVI July 2012  . Glaucoma   . Mitral regurgitation   . Shingles May 2013    right arm residual pain- last outbreak 1 1/2 yrs ago.  Marland Kitchen Hypertension     Not taking Lisinopril due to BP drops low.  . Transfusion history     last 2 yrs ago.  Marland Kitchen GERD (gastroesophageal reflux disease)     RARE - AND NO MEDS  . Cancer (HCC)     BLADDER CANCER  . Arthritis   . Balance problem     WEAK ON RIGHT SIDE ( SCOLISOSIS AND HX OF A BACK SURGERY) - BALANCE PROBLEM - "FALLS BACKWARD" - FREQUENT FALLS.  USES CANE WHEN AMBULATING  . Heart murmur     PT'S CARDIOLOGIST IS DR. Acie Fredrickson -LAST OFFICE VISIT 03/02/13 IN [facility]  . Dementia     more memory problems from stroke  . Stroke [facility])     left internal capsule 1/15  . GI bleed   . Chronic kidney disease   . Hypercholesteremia   . Glaucoma   . Fall   . History of blood transfusion LAST 2-3 YRS AGO    HAD 4 OR 5   . Complication of anesthesia     VERY DROWSY FOR HOURS AFTER SURGERY - AND DISORIENTATION  . PONV (postoperative nausea and vomiting)     IN PAST, DID WELL WITH LAST SURGERY    Surgeries: Procedure(s): CYSTO, BILATERAL RETROGRADE PYELOGRAM WITH BLADDER BIOPSY on 11/15/2015   Consultants (if any):    Discharged Condition: Improved  Hospital Course: Bryan Moore is an 82 y.o. male who was admitted 11/15/2015 with a diagnosis of Hypervascular lesion of urinary bladder and went to the operating room on 11/15/2015 and underwent the above named procedures.    He  was given perioperative antibiotics:      Anti-infectives    Start     Dose/Rate Route Frequency Ordered Stop   11/15/15 0845  ceFAZolin (ANCEF) IVPB 2g/100 mL premix     2 g 200 mL/hr over 30 Minutes Intravenous  Once 11/15/15 0842 11/15/15 1115    .  He was given sequential compression devices for DVT prophylaxis.  He benefited maximally from the hospital stay and there were no complications.    Recent vital signs:  Filed Vitals:   11/15/15 2027 11/16/15 0654  BP: 135/69 128/65  Pulse: 98 98  Temp: 97.5 F (36.4 C) 97.4 F (36.3 C)  Resp: 16 18    Recent laboratory studies:  Lab Results  Component Value Date   HGB 11.6* 11/08/2015   HGB 11.6* 02/23/2015   HGB 10.9* 02/23/2015   Lab Results  Component Value Date   WBC 4.2 11/08/2015   PLT 169 11/08/2015   Lab Results  Component Value Date   INR 1.07 04/03/2011   Lab Results  Component Value Date   NA 143 11/08/2015   K 4.7 11/08/2015   CL 108 11/08/2015   CO2 26 11/08/2015   BUN 27* 11/08/2015  CREATININE 1.65* 11/08/2015   GLUCOSE 91 11/08/2015    Discharge Medications:     Medication List    TAKE these medications        acetaminophen 500 MG tablet  Commonly known as:  TYLENOL  Take 500 mg by mouth every 6 (six) hours as needed for mild pain, moderate pain or headache.     amLODipine 5 MG tablet  Commonly known as:  NORVASC  Take 1 tablet (5 mg total) by mouth daily.     brimonidine 0.1 % Soln  Commonly known as:  ALPHAGAN P  Place 1 drop into both eyes 2 (two) times daily.     dorzolamide-timolol 22.3-6.8 MG/ML ophthalmic solution  Commonly known as:  COSOPT  Place 1 drop into both eyes every 12 (twelve) hours.     iron polysaccharides 150 MG capsule  Commonly known as:  NIFEREX  Take 150 mg by mouth every morning.     latanoprost 0.005 % ophthalmic solution  Commonly known as:  XALATAN  Place 1 drop into both eyes at bedtime.     multivitamin tablet  Take 1 tablet by mouth  daily.     Phosphatidylserine-DHA-EPA 100-19.5-6.5 MG Caps  Commonly known as:  VAYACOG  Take 1 tablet by mouth daily.        Diagnostic Studies: No results found.  Disposition: 01-Home or Self Care  Discharge Instructions    Discontinue IV    Complete by:  As directed            Follow-up Information    Follow up with [name], MD On 11/30/2015.   Specialty:  Urology   Why:  [address] information:   [place] [address] [phone]        Signed: [name] 11/16/2015, 8:02 AM

## 2015-11-22 DIAGNOSIS — N184 Chronic kidney disease, stage 4 (severe): Secondary | ICD-10-CM | POA: Diagnosis not present

## 2015-11-22 DIAGNOSIS — D649 Anemia, unspecified: Secondary | ICD-10-CM | POA: Diagnosis not present

## 2015-11-30 DIAGNOSIS — Z0000 Encounter for general adult medical examination without abnormal findings: Secondary | ICD-10-CM | POA: Diagnosis not present

## 2015-11-30 DIAGNOSIS — N3941 Urge incontinence: Secondary | ICD-10-CM | POA: Diagnosis not present

## 2015-11-30 DIAGNOSIS — Z8551 Personal history of malignant neoplasm of bladder: Secondary | ICD-10-CM | POA: Diagnosis not present

## 2015-12-19 ENCOUNTER — Telehealth: Payer: Self-pay | Admitting: Neurology

## 2015-12-19 ENCOUNTER — Encounter: Payer: Self-pay | Admitting: Neurology

## 2015-12-19 ENCOUNTER — Ambulatory Visit (INDEPENDENT_AMBULATORY_CARE_PROVIDER_SITE_OTHER): Payer: Medicare Other | Admitting: Neurology

## 2015-12-19 VITALS — BP 132/70 | HR 74 | Ht 68.0 in | Wt 159.0 lb

## 2015-12-19 DIAGNOSIS — F22 Delusional disorders: Secondary | ICD-10-CM

## 2015-12-19 DIAGNOSIS — F0281 Dementia in other diseases classified elsewhere with behavioral disturbance: Secondary | ICD-10-CM | POA: Diagnosis not present

## 2015-12-19 DIAGNOSIS — F02818 Dementia in other diseases classified elsewhere, unspecified severity, with other behavioral disturbance: Secondary | ICD-10-CM

## 2015-12-19 DIAGNOSIS — G301 Alzheimer's disease with late onset: Secondary | ICD-10-CM | POA: Diagnosis not present

## 2015-12-19 MED ORDER — CITALOPRAM HYDROBROMIDE 10 MG PO TABS: ORAL_TABLET | ORAL | Status: DC

## 2015-12-19 NOTE — Progress Notes (Signed)
Reason for visit: Alzheimer's disease  Bryan Moore is an 82 y.o. male  History of present illness:  [name] is a 82 year old right-handed white male with a history of a progressive dementing illness consistent with Alzheimer's disease. The patient has continued to progress with his memory, he lives at home with his wife. He is no longer recognizing his wife, and he has developed a delusional thought pattern, thinking that his parents are still living. He wants to go to where they live and help take care of them. The patient generally will have increased problems with delusional thinking in the evening hours, and he may become somewhat agitated with this. The patient is eating and drinking well, he sleeps well at night. He does not operate a motor vehicle. He has alprazolam to take if needed with some benefit. He has been unable to tolerate Aricept, and Namenda. Vayacog was given on the last visit, the patient took it for 2 months, and then stopped. This offered no benefit. The patient returns for an evaluation.  Past Medical History  Diagnosis Date  . Anemia     from AVM's. Requiring transfusion  . Hyperlipemia   . Aortic stenosis     s/p TAVI July 2012  . Glaucoma   . Mitral regurgitation   . Shingles May 2013    right arm residual pain- last outbreak 1 1/2 yrs ago.  Marland Kitchen Hypertension     Not taking Lisinopril due to BP drops low.  . Transfusion history     last 2 yrs ago.  Marland Kitchen GERD (gastroesophageal reflux disease)     RARE - AND NO MEDS  . Cancer (HCC)     BLADDER CANCER  . Arthritis   . Balance problem     WEAK ON RIGHT SIDE ( SCOLISOSIS AND HX OF A BACK SURGERY) - BALANCE PROBLEM - "FALLS BACKWARD" - FREQUENT FALLS.  USES CANE WHEN AMBULATING  . Heart murmur     PT'S CARDIOLOGIST IS DR. Acie Fredrickson -LAST OFFICE VISIT 03/02/13 IN [facility]  . Dementia     more memory problems from stroke  . Stroke [facility])     left internal capsule 1/15  . GI bleed   . Chronic kidney disease   .  Hypercholesteremia   . Glaucoma   . Fall   . History of blood transfusion LAST 2-3 YRS AGO    HAD 4 OR 5   . Complication of anesthesia     VERY DROWSY FOR HOURS AFTER SURGERY - AND DISORIENTATION  . PONV (postoperative nausea and vomiting)     IN PAST, DID WELL WITH LAST SURGERY    Past Surgical History  Procedure Laterality Date  . Appendectomy    . Cardiac catheterization  10/23/2010    ef 65%  . US echocardiography  08/31/10    EF 55-60%  . Cardiovascular stress test  01/01/2007    EF 64%, NO EVIDENCE OF ISCHEMIA  . Transurethral resection of bladder tumor with gyrus (turbt-gyrus) N/A 04/13/2013    Procedure: TRANSURETHRAL RESECTION OF BLADDER TUMOR WITH GYRUS (TURBT-GYRUS);  Surgeon: [name], MD;  Location: [facility];  Service: Urology;  Laterality: N/A;  . Cystoscopy with stent placement Left 04/13/2013    Procedure:  LEFT URETERAL STENT PLACEMENT;  Surgeon: [name], MD;  Location: [facility];  Service: Urology;  Laterality: Left;  . Transurethral resection of bladder tumor N/A 06/04/2013    Procedure: RESTAGING TRANSURETHRAL RESECTION OF BLADDER TUMOR (TURBT), URETERAL  CATHERIZATION ;  Surgeon: [name], MD;  Location: [facility];  Service: Urology;  Laterality: N/A;  CYSTO RESTAGING TURBT     . Cystoscopy w/ retrogrades Bilateral 09/17/2013    Procedure: CYSTOSCOPY WITH BILATERAL RETROGRADE;  Surgeon: [name], MD;  Location: [facility];  Service: Urology;  Laterality: Bilateral;  . Transurethral resection of bladder tumor N/A 09/17/2013    Procedure: TRANSURETHRAL RESECTION OF BLADDER TUMOR WITH MITOMYCIN-C;  Surgeon: [name], MD;  Location: [facility];  Service: Urology;  Laterality: N/A;  . Cardiac valve replacement      02/2011([facility])  . Back surgery  2010 OR 2011    LOWER  . Eye surgery      bilateral cataracts with lens implants  . Cystoscopy w/ retrogrades Bilateral 11/15/2015    Procedure: CYSTO, BILATERAL RETROGRADE PYELOGRAM WITH BLADDER BIOPSY;  Surgeon: [name],  MD;  Location: [facility];  Service: Urology;  Laterality: Bilateral;    Family History  Problem Relation Age of Onset  . COPD Father   . Stroke Father   . Heart attack Brother   . Parkinson's disease Brother     Social history:  reports that he quit smoking about 34 years ago. He has never used smokeless tobacco. He reports that he drinks about 4.2 oz of alcohol per week. He reports that he does not use illicit drugs.    Allergies  Allergen Reactions  . Advil [Ibuprofen] Other (See Comments)    Causes stomach bleeding, need transfusions as result  . Aspirin Other (See Comments)    Causes stomach bleeding, needs transfusion as result  . Penicillins Itching and Swelling    Has patient had a PCN reaction causing immediate rash, facial/tongue/throat swelling, SOB or lightheadedness with hypotension: Unsure Has patient had a PCN reaction causing severe rash involving mucus membranes or skin necrosis: Unsure  Has patient had a PCN reaction that required hospitalization Yes Has patient had a PCN reaction occurring within the last 10 years: No If all of the above answers are "NO", then may proceed with Cephalosporin use.  . Atorvastatin     Joint ache  . Namenda [Memantine]     Stomach pain  . Other Other (See Comments)    No blood thinners due to GI bleed.  . Ativan [Lorazepam] Anxiety    He became very anxious and aggitated    Medications:  Prior to Admission medications   Medication Sig Start Date End Date Taking? Authorizing Provider  acetaminophen (TYLENOL) 500 MG tablet Take 500 mg by mouth every 6 (six) hours as needed for mild pain, moderate pain or headache.   Yes Historical Provider, MD  amLODipine (NORVASC) 5 MG tablet Take 1 tablet (5 mg total) by mouth daily. 11/05/13  Yes Ivan Anchors Love, PA-C  cholecalciferol (VITAMIN D) 1000 units tablet Take 1,000 Units by mouth daily.   Yes Historical Provider, MD  dorzolamide-timolol (COSOPT) 22.3-6.8 MG/ML ophthalmic solution Place 1  drop into both eyes every 12 (twelve) hours.  04/02/11  Yes Historical Provider, MD  iron polysaccharides (NIFEREX) 150 MG capsule Take 150 mg by mouth every morning.    Yes Historical Provider, MD  latanoprost (XALATAN) 0.005 % ophthalmic solution Place 1 drop into both eyes at bedtime. 10/11/15  Yes Historical Provider, MD  Multiple Vitamin (MULTIVITAMIN) tablet Take 1 tablet by mouth daily.   Yes Historical Provider, MD  Phosphatidylserine-DHA-EPA (VAYACOG) 100-19.5-6.5 MG CAPS Take 1 tablet by mouth daily. 03/21/15  Yes Kathrynn Ducking, MD  vitamin B-12 (CYANOCOBALAMIN) 1000 MCG tablet Take 1,000 mcg by mouth daily.   Yes Historical Provider, MD    ROS:  Out of a complete 14 system review of symptoms, the patient complains only of the following symptoms, and all other reviewed systems are negative.  Excessive eating Dizziness, numbness, weakness Confusion, anxiety, hallucinations Restless legs  Blood pressure 132/70, pulse 74, height 5\' 8"  (1.727 m), weight 159 lb (72.122 kg).  Physical Exam  General: The patient is alert and cooperative at the time of the examination.  Skin: No significant peripheral edema is noted.   Neurologic Exam  Mental status: The patient is alert and oriented x 2 at the time of the examination (not oriented to date). The Mini-Mental Status Examination done today shows a total score of 20/30..   Cranial nerves: Facial symmetry is present. Speech is normal, no aphasia or dysarthria is noted. Extraocular movements are full. Visual fields are full.  Motor: The patient has good strength in all 4 extremities.  Sensory examination: Soft touch sensation is symmetric on the face, arms, and legs.  Coordination: The patient has good finger-nose-finger and heel-to-shin bilaterally.  Gait and station: The patient has a slightly wide-based unsteady gait. The patient uses a cane for ambulation. Tandem gait was not attempted. Romberg is negative. No drift is  seen.  Reflexes: Deep tendon reflexes are symmetric.   Assessment/Plan:  1. Progressive memory disorder, Alzheimer's disease  2. Delusional thinking  The patient has developed significant delusions associated with his dementia. These issues are difficult to treat. The patient is not having overt hallucinations. He will be placed on Celexa to help some of the agitation. I have indicated that the family is to begin looking for alternative living situations with an extended care facility for this gentleman as he may become unmanageable at home in the near future. The patient is still performing activities of daily living such as shaving, dressing, and bathing. He may shave with toothpaste or underarm deodorant if he is not supervised. He will follow-up in 5 months.  Jill Alexanders MD 12/19/2015 7:27 PM  [facility] [address] [place], [place] [address]  Phone [phone] Fax [phone]

## 2015-12-19 NOTE — Telephone Encounter (Signed)
The patient is being seen today in the office. Please refer to revisit note.

## 2015-12-19 NOTE — Patient Instructions (Signed)
Alzheimer Disease °Alzheimer disease is a mental disorder. It causes memory loss and loss of other mental functions, such as learning, thinking, problem solving, communicating, and completing tasks. The mental losses interfere with the ability to perform daily activities at work, at home, or in social situations. °Alzheimer disease usually starts in a person's late 60s or early 70s but can start earlier in life (familial form). The mental changes caused by this disease are permanent and worsen over time. As the illness progresses, the ability to do even the simplest things is lost. Survival with Alzheimer disease ranges from several years to as long as 20 years. °CAUSES °Alzheimer disease is caused by abnormally high levels of a protein (beta-amyloid) in the brain. This protein forms very small deposits within and around the brain's nerve cells. These deposits prevent the nerve cells from working properly. Experts are not certain what causes the beta-amyloid deposits in this disease. °RISK FACTORS °The following major risk factors have been identified: °· Increasing age. °· Certain genetic variations, such as Down syndrome (trisomy 21). °SYMPTOMS °In the early stages of Alzheimer disease, you are still able to perform daily activities but need greater effort, more time, or memory aids. Early symptoms include: °· Mild memory loss of recent events, names, or phone numbers. °· Loss of objects. °· Minor loss of vocabulary. °· Difficulty with complex tasks, such as paying bills or driving in unfamiliar locations. °Other mental functions deteriorate as the disease worsens. These changes slowly go from mild to severe. Symptoms at this stage include: °· Difficulty remembering. You may not be able to recall personal information such as your address and telephone number. You may become confused about the date, the season of the year, or your location. °· Difficulty maintaining attention. You may forget what you wanted to say  during conversations and repeat what you have already said. °· Difficulty learning new information or tasks. You may not remember what you read or the name of a new friend you met. °· Difficulty counting or doing math. You may have difficulty with complex math problems. You may make mistakes in paying bills or managing your checkbook. °· Poor reasoning and judgment. You may make poor decisions or not dress right for the weather. °· Difficulty communicating. You may have regular difficulty remembering words, naming objects, expressing yourself clearly, or writing sentences that make sense. °· Difficulty performing familiar daily activities. You may get lost driving in familiar locations or need help eating, bathing, dressing, grooming, or using the toilet. You may have difficulty maintaining bladder or bowel control. °· Difficulty recognizing familiar faces. You may confuse family members or close friends with one another. You may not recognize a close relative or may mistake strangers for family. °Alzheimer disease also may cause changes in personality and behavior. These changes include:  °· Loss of interest or motivation. °· Social withdrawal. °· Anxiety. °· Difficulty sleeping. °· Uncharacteristic anger or combativeness. °· A false belief that someone is trying to harm you (paranoia). °· Seeing things that are not real (hallucinations). °· Agitation. °Confusion and disruptive behavior are often worse at night and may be triggered by changes in the environment or acute medical issues. °DIAGNOSIS  °Alzheimer disease is diagnosed through an assessment by your health care provider. During this assessment, your health care provider will do the following: °· Ask you and your family, friends, or caregivers questions about your symptoms, their frequency, their duration and progression, and the effect they are having on your life. °·   Ask questions about your personal and family medical history and use of alcohol or drugs,  including prescription medicine.  Perform a physical exam and order blood tests and brain imaging exams. Your health care provider may refer you to a specialist for detailed evaluation of your mental functions (neuropsychological testing).  Many different brain disorders, medical conditions, and certain substances can cause symptoms that resemble Alzheimer disease symptoms. These must be ruled out before this disease can be diagnosed. If Alzheimer disease is diagnosed, it will be considered either "possible" or "probable" Alzheimer disease. "Possible" Alzheimer disease means that your symptoms are typical of the disease and no other disorder is causing them. "Probable" Alzheimer disease means that you also have a family history of the disease or genetic test results that support the diagnosis. Certain tests, mostly used in research studies, are highly specific for Alzheimer disease.  TREATMENT  There is currently no cure for this disease. The goals of treatment are to:  Slow down the progression of the disease.  Preserve mental function as long as possible.  Manage behavioral symptoms.  Make life easier for the person with Alzheimer disease and his or her caregivers. The following treatment options are available:  Medicine. Certain medicines may help slow memory loss by changing the level of certain chemicals in the brain. Medicine may also help with behavioral symptoms.  Talk therapy. Talk therapy provides education, support, and memory aids for people with this disease. It is most effective in the early stages of the illness.  Caregiving. Caregivers may be family members, friends, or trained medical professionals. They help the person with Alzheimer disease with daily life activities. Caregiving may take place at home or at a nursing facility.  Family support groups. These provide education, emotional support, and information about community resources to family members who are taking care of  the person with this disease.   This information is not intended to replace advice given to you by your health care provider. Make sure you discuss any questions you have with your health care provider.   Document Released: 04/03/2004 Document Revised: 08/13/2014 Document Reviewed: 11/28/2012 Elsevier Interactive Patient Education [employer].

## 2015-12-19 NOTE — Telephone Encounter (Signed)
Bryan Moore and Bryan Moore (children) called said over the last month the dementia has increased. Over the weekend he is not able to tell who his wife's is, her name and relationship. He thinks she is there to take care of him. He insists his parents are alive, he sees them daily and speaks with them daily. Last night he thought he was in [place] per [place]. Spouse is in denial of the symptoms and may not be able to talk in front of him day. He is now becoming verbally aggressive. On Friday he went outside and refused to come back in the house. [name] said he is very confused and becomes angry. Spouse is in denial of the symptoms and may not be able to talk in front of him day  The children are going to call her on her cell phone about 10:15 to listen to [place] today. Children sts their mother has agreed to this.

## 2015-12-22 DIAGNOSIS — J069 Acute upper respiratory infection, unspecified: Secondary | ICD-10-CM | POA: Diagnosis not present

## 2016-01-10 DIAGNOSIS — I10 Essential (primary) hypertension: Secondary | ICD-10-CM | POA: Diagnosis not present

## 2016-01-10 DIAGNOSIS — E7800 Pure hypercholesterolemia, unspecified: Secondary | ICD-10-CM | POA: Diagnosis not present

## 2016-01-10 DIAGNOSIS — D649 Anemia, unspecified: Secondary | ICD-10-CM | POA: Diagnosis not present

## 2016-01-13 DIAGNOSIS — D649 Anemia, unspecified: Secondary | ICD-10-CM | POA: Diagnosis not present

## 2016-01-13 DIAGNOSIS — I10 Essential (primary) hypertension: Secondary | ICD-10-CM | POA: Diagnosis not present

## 2016-01-13 DIAGNOSIS — N184 Chronic kidney disease, stage 4 (severe): Secondary | ICD-10-CM | POA: Diagnosis not present

## 2016-01-13 DIAGNOSIS — I69928 Other speech and language deficits following unspecified cerebrovascular disease: Secondary | ICD-10-CM | POA: Diagnosis not present

## 2016-01-13 DIAGNOSIS — Z952 Presence of prosthetic heart valve: Secondary | ICD-10-CM | POA: Diagnosis not present

## 2016-01-13 DIAGNOSIS — E7800 Pure hypercholesterolemia, unspecified: Secondary | ICD-10-CM | POA: Diagnosis not present

## 2016-01-13 DIAGNOSIS — F0281 Dementia in other diseases classified elsewhere with behavioral disturbance: Secondary | ICD-10-CM | POA: Diagnosis not present

## 2016-02-23 ENCOUNTER — Telehealth: Payer: Self-pay

## 2016-02-23 MED ORDER — CITALOPRAM HYDROBROMIDE 20 MG PO TABS: 20.0000 mg | ORAL_TABLET | Freq: Every day | ORAL | Status: DC

## 2016-02-23 NOTE — Telephone Encounter (Signed)
A prescription was sent in for the 20 mg tablet of Celexa, the patient was taking 2 of the 10 mg tablets daily.

## 2016-02-23 NOTE — Telephone Encounter (Signed)
Refill request received for Citalopram 10mg  to be sent to [place].

## 2016-03-02 DIAGNOSIS — Z8673 Personal history of transient ischemic attack (TIA), and cerebral infarction without residual deficits: Secondary | ICD-10-CM | POA: Diagnosis not present

## 2016-03-02 DIAGNOSIS — R001 Bradycardia, unspecified: Secondary | ICD-10-CM | POA: Diagnosis not present

## 2016-03-02 DIAGNOSIS — Z952 Presence of prosthetic heart valve: Secondary | ICD-10-CM | POA: Diagnosis not present

## 2016-03-02 DIAGNOSIS — I10 Essential (primary) hypertension: Secondary | ICD-10-CM | POA: Diagnosis not present

## 2016-03-02 DIAGNOSIS — Z006 Encounter for examination for normal comparison and control in clinical research program: Secondary | ICD-10-CM | POA: Diagnosis not present

## 2016-03-02 DIAGNOSIS — I440 Atrioventricular block, first degree: Secondary | ICD-10-CM | POA: Diagnosis not present

## 2016-03-02 DIAGNOSIS — I350 Nonrheumatic aortic (valve) stenosis: Secondary | ICD-10-CM | POA: Diagnosis not present

## 2016-03-02 DIAGNOSIS — K2971 Gastritis, unspecified, with bleeding: Secondary | ICD-10-CM | POA: Diagnosis not present

## 2016-04-20 DIAGNOSIS — I10 Essential (primary) hypertension: Secondary | ICD-10-CM | POA: Diagnosis not present

## 2016-04-20 DIAGNOSIS — D649 Anemia, unspecified: Secondary | ICD-10-CM | POA: Diagnosis not present

## 2016-04-20 DIAGNOSIS — E7800 Pure hypercholesterolemia, unspecified: Secondary | ICD-10-CM | POA: Diagnosis not present

## 2016-05-05 DIAGNOSIS — Z23 Encounter for immunization: Secondary | ICD-10-CM | POA: Diagnosis not present

## 2016-05-28 ENCOUNTER — Telehealth: Payer: Self-pay | Admitting: Cardiovascular Disease

## 2016-05-28 ENCOUNTER — Encounter (INDEPENDENT_AMBULATORY_CARE_PROVIDER_SITE_OTHER): Payer: Self-pay

## 2016-05-28 ENCOUNTER — Encounter: Payer: Self-pay | Admitting: Cardiovascular Disease

## 2016-05-28 NOTE — Telephone Encounter (Signed)
Spoke with patient's wife, Danton Clap, who states she was able to get the information she needed from [place].  She thanked me for the call.

## 2016-05-28 NOTE — Telephone Encounter (Signed)
Bryan Moore is calling because she is needing list of all his medications . Stated she called and spoke w/ someone in Medical records and they hung up on her and they were rude  . Please call  Thanks

## 2016-05-30 DIAGNOSIS — N3941 Urge incontinence: Secondary | ICD-10-CM | POA: Diagnosis not present

## 2016-05-30 DIAGNOSIS — N401 Enlarged prostate with lower urinary tract symptoms: Secondary | ICD-10-CM | POA: Diagnosis not present

## 2016-05-30 DIAGNOSIS — Z8551 Personal history of malignant neoplasm of bladder: Secondary | ICD-10-CM | POA: Diagnosis not present

## 2016-05-31 ENCOUNTER — Emergency Department (HOSPITAL_COMMUNITY)
Admission: EM | Admit: 2016-05-31 | Discharge: 2016-06-01 | Disposition: A | Discharge status: Home / Self Care | Payer: Medicare Other | Attending: Emergency Medicine | Admitting: Emergency Medicine

## 2016-05-31 ENCOUNTER — Encounter (HOSPITAL_COMMUNITY): Payer: Self-pay

## 2016-05-31 DIAGNOSIS — S0010XA Contusion of unspecified eyelid and periocular area, initial encounter: Secondary | ICD-10-CM | POA: Diagnosis not present

## 2016-05-31 DIAGNOSIS — Z79899 Other long term (current) drug therapy: Secondary | ICD-10-CM | POA: Insufficient documentation

## 2016-05-31 DIAGNOSIS — Y9301 Activity, walking, marching and hiking: Secondary | ICD-10-CM | POA: Insufficient documentation

## 2016-05-31 DIAGNOSIS — Z87891 Personal history of nicotine dependence: Secondary | ICD-10-CM | POA: Insufficient documentation

## 2016-05-31 DIAGNOSIS — T148XXA Other injury of unspecified body region, initial encounter: Secondary | ICD-10-CM | POA: Diagnosis not present

## 2016-05-31 DIAGNOSIS — Z23 Encounter for immunization: Secondary | ICD-10-CM | POA: Insufficient documentation

## 2016-05-31 DIAGNOSIS — Y92009 Unspecified place in unspecified non-institutional (private) residence as the place of occurrence of the external cause: Secondary | ICD-10-CM | POA: Diagnosis not present

## 2016-05-31 DIAGNOSIS — S199XXA Unspecified injury of neck, initial encounter: Secondary | ICD-10-CM | POA: Diagnosis not present

## 2016-05-31 DIAGNOSIS — Y999 Unspecified external cause status: Secondary | ICD-10-CM | POA: Diagnosis not present

## 2016-05-31 DIAGNOSIS — S01111A Laceration without foreign body of right eyelid and periocular area, initial encounter: Secondary | ICD-10-CM | POA: Insufficient documentation

## 2016-05-31 DIAGNOSIS — S0990XA Unspecified injury of head, initial encounter: Secondary | ICD-10-CM

## 2016-05-31 DIAGNOSIS — I10 Essential (primary) hypertension: Secondary | ICD-10-CM | POA: Diagnosis not present

## 2016-05-31 DIAGNOSIS — G309 Alzheimer's disease, unspecified: Secondary | ICD-10-CM | POA: Insufficient documentation

## 2016-05-31 DIAGNOSIS — W1839XA Other fall on same level, initial encounter: Secondary | ICD-10-CM | POA: Diagnosis not present

## 2016-05-31 DIAGNOSIS — S098XXA Other specified injuries of head, initial encounter: Secondary | ICD-10-CM | POA: Diagnosis not present

## 2016-05-31 DIAGNOSIS — W19XXXA Unspecified fall, initial encounter: Secondary | ICD-10-CM

## 2016-05-31 MED ORDER — TETANUS-DIPHTH-ACELL PERTUSSIS 5-2.5-18.5 LF-MCG/0.5 IM SUSP
0.5000 mL | Freq: Once | INTRAMUSCULAR | Status: AC
  Administered 2016-06-01: 0.5 mL via INTRAMUSCULAR
  Filled 2016-05-31: qty 0.5

## 2016-05-31 MED ORDER — ACETAMINOPHEN 325 MG PO TABS
650.0000 mg | ORAL_TABLET | Freq: Once | ORAL | Status: AC
  Administered 2016-06-01: 650 mg via ORAL
  Filled 2016-05-31: qty 2

## 2016-05-31 NOTE — ED Provider Notes (Signed)
[facility] Moore DEPT Provider Note   CSN: [id number] Arrival date & time: 05/31/16  2334  By signing my name below, I, Higinio Plan, attest that this documentation has been prepared under the direction and in the presence of Ripley Fraise, MD . Electronically Signed: Higinio Plan, Scribe. 06/01/2016. 12:04 AM  History   Chief Complaint Chief Complaint  Patient presents with  . Fall   The history is provided by the patient. No language interpreter was used.   HPI Comments: Bryan Moore is a 82 y.o. male with PMHx of dementia, brought in by EMS to the Emergency Department from [facility] for an evaluation s/p a fall that occurred this evening. Per wife, pt was ambulating with his walker this evening when he suddenly lost his balance and fell directly onto his forehead. She denies loss of consciousness; however, she states pt was unable to get up on his own. She states pt "is in pain" and also has lacerations above his right eyebrow, left third digit and right elbow due to his fall. Pt's wife denies vomiting, neck pain and use of blood thinners. She notes pt has been acting "normal but is nervous and scared."  She is unaware of pt's last tetanus immunization.   Past Medical History:  Diagnosis Date  . Anemia    from AVM's. Requiring transfusion  . Aortic stenosis    s/p TAVI July 2012  . Arthritis   . Balance problem    WEAK ON RIGHT SIDE ( SCOLISOSIS AND HX OF A BACK SURGERY) - BALANCE PROBLEM - "FALLS BACKWARD" - FREQUENT FALLS.  USES CANE WHEN AMBULATING  . Cancer (HCC)    BLADDER CANCER  . Chronic kidney disease   . Complication of anesthesia    VERY DROWSY FOR HOURS AFTER SURGERY - AND DISORIENTATION  . Dementia    more memory problems from stroke  . Fall   . GERD (gastroesophageal reflux disease)    RARE - AND NO MEDS  . GI bleed   . Glaucoma   . Glaucoma   . Heart murmur    PT'S CARDIOLOGIST IS DR. Acie Fredrickson -LAST OFFICE VISIT 03/02/13 IN [facility]  . History of blood  transfusion LAST 2-3 YRS AGO   HAD 4 OR 5   . Hypercholesteremia   . Hyperlipemia   . Hypertension    Not taking Lisinopril due to BP drops low.  . Mitral regurgitation   . PONV (postoperative nausea and vomiting)    IN PAST, DID WELL WITH LAST SURGERY  . Shingles May 2013   right arm residual pain- last outbreak 1 1/2 yrs ago.  . Stroke ([place])    left internal capsule 1/15  . Transfusion history    last 2 yrs ago.    Patient Active Problem List   Diagnosis Date Noted  . Delusional disorder ([place]) 12/19/2015  . Hypervascular lesion of urinary bladder 11/16/2015  . Hx of bladder cancer 11/15/2015  . Alzheimer's disease 02/24/2014  . Dementia 02/24/2014  . Greater trochanteric bursitis of right hip 02/08/2014  . Presenile dementia, uncomplicated 38/05/1750  . Memory loss 11/12/2013  . Lacunar infarct, acute ([place]) 11/12/2013  . CVA (cerebral infarction) 08/19/2013  . Encephalopathy 08/18/2013  . Bladder cancer ([place]) 04/15/2013  . SunDown syndrome 04/15/2013  . Frequent falls 07/17/2012  . Fall 12/17/2011  . Atypical chest pain 12/10/2011  . Aortic valve disease 03/28/2011  . Hypertension 03/28/2011  . Hyperlipidemia 03/28/2011  . Anemia 03/28/2011    Past Surgical  History:  Procedure Laterality Date  . APPENDECTOMY    . BACK SURGERY  2010 OR 2011   LOWER  . CARDIAC CATHETERIZATION  10/23/2010   ef 65%  . CARDIAC VALVE REPLACEMENT     02/2011([facility])  . CARDIOVASCULAR STRESS TEST  01/01/2007   EF 64%, NO EVIDENCE OF ISCHEMIA  . CYSTOSCOPY W/ RETROGRADES Bilateral 09/17/2013   Procedure: CYSTOSCOPY WITH BILATERAL RETROGRADE;  Surgeon: [name], MD;  Location: [facility];  Service: Urology;  Laterality: Bilateral;  . CYSTOSCOPY W/ RETROGRADES Bilateral 11/15/2015   Procedure: CYSTO, BILATERAL RETROGRADE PYELOGRAM WITH BLADDER BIOPSY;  Surgeon: [name], MD;  Location: [facility];  Service: Urology;  Laterality: Bilateral;  . CYSTOSCOPY WITH STENT PLACEMENT Left 04/13/2013    Procedure:  LEFT URETERAL STENT PLACEMENT;  Surgeon: [name], MD;  Location: [facility];  Service: Urology;  Laterality: Left;  . EYE SURGERY     bilateral cataracts with lens implants  . TRANSURETHRAL RESECTION OF BLADDER TUMOR N/A 06/04/2013   Procedure: RESTAGING TRANSURETHRAL RESECTION OF BLADDER TUMOR (TURBT), URETERAL CATHERIZATION ;  Surgeon: [name], MD;  Location: [facility];  Service: Urology;  Laterality: N/A;  CYSTO RESTAGING TURBT     . TRANSURETHRAL RESECTION OF BLADDER TUMOR N/A 09/17/2013   Procedure: TRANSURETHRAL RESECTION OF BLADDER TUMOR WITH MITOMYCIN-C;  Surgeon: [name], MD;  Location: [facility];  Service: Urology;  Laterality: N/A;  . TRANSURETHRAL RESECTION OF BLADDER TUMOR WITH GYRUS (TURBT-GYRUS) N/A 04/13/2013   Procedure: TRANSURETHRAL RESECTION OF BLADDER TUMOR WITH GYRUS (TURBT-GYRUS);  Surgeon: [name], MD;  Location: [facility];  Service: Urology;  Laterality: N/A;  . US ECHOCARDIOGRAPHY  08/31/10   EF 55-60%    Home Medications    Prior to Admission medications   Medication Sig Start Date End Date Taking? Authorizing Provider  acetaminophen (TYLENOL) 500 MG tablet Take 500 mg by mouth every 6 (six) hours as needed for mild pain, moderate pain or headache.    Historical Provider, MD  amLODipine (NORVASC) 5 MG tablet Take 1 tablet (5 mg total) by mouth daily. 11/05/13   Bary Leriche, PA-C  cholecalciferol (VITAMIN D) 1000 units tablet Take 1,000 Units by mouth daily.    Historical Provider, MD  citalopram (CELEXA) 20 MG tablet Take 1 tablet (20 mg total) by mouth daily. 02/23/16   Kathrynn Ducking, MD  dorzolamide-timolol (COSOPT) 22.3-6.8 MG/ML ophthalmic solution Place 1 drop into both eyes every 12 (twelve) hours.  04/02/11   Historical Provider, MD  iron polysaccharides (NIFEREX) 150 MG capsule Take 150 mg by mouth every morning.     Historical Provider, MD  latanoprost (XALATAN) 0.005 % ophthalmic solution Place 1 drop into both eyes at bedtime. 10/11/15    Historical Provider, MD  Multiple Vitamin (MULTIVITAMIN) tablet Take 1 tablet by mouth daily.    Historical Provider, MD  Phosphatidylserine-DHA-EPA (VAYACOG) 100-19.5-6.5 MG CAPS Take 1 tablet by mouth daily. 03/21/15   Kathrynn Ducking, MD  vitamin B-12 (CYANOCOBALAMIN) 1000 MCG tablet Take 1,000 mcg by mouth daily.    Historical Provider, MD    Family History Family History  Problem Relation Age of Onset  . COPD Father   . Stroke Father   . Heart attack Brother   . Parkinson's disease Brother     Social History Social History  Substance Use Topics  . Smoking status: Former Smoker    Quit date: 07/04/1981  . Smokeless tobacco: Never Used  . Alcohol use 4.2 oz/week  7 Glasses of wine per week     Comment: nightly GLASS OF WINE   Allergies   Advil [ibuprofen]; Aspirin; Penicillins; Atorvastatin; Namenda [memantine]; Other; and Ativan [lorazepam]  Review of Systems Review of Systems  Gastrointestinal: Negative for vomiting.  Musculoskeletal: Negative for neck pain.  Skin: Positive for wound.  Neurological: Negative for syncope.  All other systems reviewed and are negative.  Physical Exam Updated Vital Signs BP 137/57 (BP Location: Right Arm)   Pulse 66   Resp 18   SpO2 95%   Physical Exam CONSTITUTIONAL: elderly, frail HEAD: laceration and contusion near right eyebrow otherwise no other signs of injury  EYES: EOMI/PERRL ENMT: Mucous membranes moist, no evidence of facial/nasal trauma NECK: cervical collar in place SPINE/BACK:entire spine non-tender, no bruising/crepitance/stepoffs noted to spine CV: S1/S2 noted, no murmurs/rubs/gallops noted LUNGS: Lungs are clear to auscultation bilaterally, no apparent distress Chest - no tenderness or bruising ABDOMEN: soft, nontender NEURO: Pt is awake/alert, moves all extremitiesx4.  Slow to respond but acting at baseline.   EXTREMITIES: pulses normal/equal, full ROM, abrasion to right elbow, no focal tenderness, All other  extremities/joints palpated/ranged and nontender SKIN: warm, color normal PSYCH: no abnormalities of mood noted, alert and oriented to situation  ED Treatments / Results  Labs (all labs ordered are listed, but only abnormal results are displayed) Labs Reviewed - No data to display  EKG  EKG Interpretation None      Radiology Ct Head Wo Contrast  Result Date: 06/01/2016 CLINICAL DATA:  [place] with laceration above right eyebrow EXAM: CT HEAD WITHOUT CONTRAST CT CERVICAL SPINE WITHOUT CONTRAST TECHNIQUE: Multidetector CT imaging of the head and cervical spine was performed following the standard protocol without intravenous contrast. Multiplanar CT image reconstructions of the cervical spine were also generated. COMPARISON:  02/23/2015, 02/18/2015 FINDINGS: CT HEAD FINDINGS Brain: No acute territorial infarction, intracranial hemorrhage, focal mass lesion or mass effect. Moderate periventricular and subcortical white matter hypodensities consistent with small vessel disease. Moderate atrophy. Ventricles are similar in size and configuration to the previous exam. Old appearing lacunar infarcts within the bilateral basal ganglia as before. Vascular: Carotid artery calcifications at the skullbase. No hyperdense vessels. Skull: Mastoid air cells clear. There is no depressed skull fracture. Sinuses/Orbits: Mild mucosal thickening in the paranasal sinuses. No acute orbital abnormality. Other: Large right forehead scalp laceration/ hematoma. CT CERVICAL SPINE FINDINGS Alignment: Straightening of the cervical spine. Minimal anterior listhesis of C3 on C4, unchanged. 5 mm anterior listhesis of C7 on T1, also unchanged. Slight asymmetric prominence of the right C3-C4 facet joint. Skull base and vertebrae: Craniovertebral junction appears intact. No gross vertebral fracture identified. Soft tissues and spinal canal: Prevertebral soft tissue thickness appears normal. Disc levels: Severe narrowing at C4-C5, C5-C6,  and C6-C7 with moderate to marked narrowing at C7-T1. Anterior osteophytes from C4 through T1. Posterior disc osteophyte complex these at C4-C5, C5-C6, and C6-C7. Mild canal stenosis between C4 and C7. Multilevel facet hyper trophic degenerative changes with moderate severe multilevel foraminal stenosis. Upper chest: Lung apices are clear. There are carotid artery calcifications. Aberrant right subclavian artery with retroesophageal course. Other: None IMPRESSION: 1. No CT evidence for acute intracranial abnormality. Other chronic changes as previously described. 2. Large right forehead scalp laceration hematoma 3. No acute fracture is visualized. Slight asymmetric prominence of right C3-C4 facet compared to previous exam, possibly due to degenerative change. If persistent pain or concern for ligamentous injury, MRI may be considered. 4. Severe degenerative changes of the cervical spine.  5. Aberrant origin right subclavian artery with retroesophageal course. Electronically Signed   By: Donavan Foil M.D.   On: 06/01/2016 00:57   Ct Cervical Spine Wo Contrast  Result Date: 06/01/2016 CLINICAL DATA:  [place] with laceration above right eyebrow EXAM: CT HEAD WITHOUT CONTRAST CT CERVICAL SPINE WITHOUT CONTRAST TECHNIQUE: Multidetector CT imaging of the head and cervical spine was performed following the standard protocol without intravenous contrast. Multiplanar CT image reconstructions of the cervical spine were also generated. COMPARISON:  02/23/2015, 02/18/2015 FINDINGS: CT HEAD FINDINGS Brain: No acute territorial infarction, intracranial hemorrhage, focal mass lesion or mass effect. Moderate periventricular and subcortical white matter hypodensities consistent with small vessel disease. Moderate atrophy. Ventricles are similar in size and configuration to the previous exam. Old appearing lacunar infarcts within the bilateral basal ganglia as before. Vascular: Carotid artery calcifications at the skullbase. No  hyperdense vessels. Skull: Mastoid air cells clear. There is no depressed skull fracture. Sinuses/Orbits: Mild mucosal thickening in the paranasal sinuses. No acute orbital abnormality. Other: Large right forehead scalp laceration/ hematoma. CT CERVICAL SPINE FINDINGS Alignment: Straightening of the cervical spine. Minimal anterior listhesis of C3 on C4, unchanged. 5 mm anterior listhesis of C7 on T1, also unchanged. Slight asymmetric prominence of the right C3-C4 facet joint. Skull base and vertebrae: Craniovertebral junction appears intact. No gross vertebral fracture identified. Soft tissues and spinal canal: Prevertebral soft tissue thickness appears normal. Disc levels: Severe narrowing at C4-C5, C5-C6, and C6-C7 with moderate to marked narrowing at C7-T1. Anterior osteophytes from C4 through T1. Posterior disc osteophyte complex these at C4-C5, C5-C6, and C6-C7. Mild canal stenosis between C4 and C7. Multilevel facet hyper trophic degenerative changes with moderate severe multilevel foraminal stenosis. Upper chest: Lung apices are clear. There are carotid artery calcifications. Aberrant right subclavian artery with retroesophageal course. Other: None IMPRESSION: 1. No CT evidence for acute intracranial abnormality. Other chronic changes as previously described. 2. Large right forehead scalp laceration hematoma 3. No acute fracture is visualized. Slight asymmetric prominence of right C3-C4 facet compared to previous exam, possibly due to degenerative change. If persistent pain or concern for ligamentous injury, MRI may be considered. 4. Severe degenerative changes of the cervical spine. 5. Aberrant origin right subclavian artery with retroesophageal course. Electronically Signed   By: Donavan Foil M.D.   On: 06/01/2016 00:57    Procedures Procedures (including critical care time)  Medications Ordered in ED Medications - No data to display  DIAGNOSTIC STUDIES:  Oxygen Saturation is 95% on RA, normal  by my interpretation.    COORDINATION OF CARE:  11:51 PM Discussed treatment plan with pt at bedside and pt agreed to plan.  Initial Impression / Assessment and Plan / ED Course  I have reviewed the triage vital signs and the nursing notes.    Clinical Course    Pt here with mechanical fall He is awake/alert, at baseline per wife Ct head/cspine performed due to age, location of injury and also concern for cspine injury as he is risk for cord injury due to age CT imaging negative for acute injury Pt able to stand/bear weight without any new pain No other signs of acute trauma Wound repaired by nursing with steri-strips I feel he is appropriate for d/c home as he is able to stand/bear weight and at baseline.  Wife/patient agreeable   I personally performed the services described in this documentation, which was scribed in my presence. The recorded information has been reviewed and is accurate.   Final  Clinical Impressions(s) / ED Diagnoses   Final diagnoses:  Fall, initial encounter  Injury of head, initial encounter  Laceration of right eyebrow, initial encounter    New Prescriptions New Prescriptions   No medications on file     Ripley Fraise, MD 06/01/16 [phone]

## 2016-05-31 NOTE — ED Triage Notes (Addendum)
Pt BIB [facility] from [facility] where he lives with his wife. Pt fell into a chair this evening, resulting in a laceration above his R eyebrow, a laceration to his L middle finger, and a skin tear to his R elbow. All bleeding controlled; pt not on blood thinners. Pt has dementia and is currently at baseline per EMS. No complaints per EMS. Negative spinal screen with EMS, C collar applied by EMS as a precaution. Pt walks with a walker at home. Wife denies LOC with fall.

## 2016-05-31 NOTE — ED Notes (Signed)
Bed: HO64 Expected date:  Expected time:  Means of arrival:  Comments: Hold for hall b

## 2016-06-01 ENCOUNTER — Other Ambulatory Visit (HOSPITAL_COMMUNITY): Payer: Medicare Other

## 2016-06-01 ENCOUNTER — Emergency Department (HOSPITAL_COMMUNITY): Payer: Medicare Other

## 2016-06-01 DIAGNOSIS — R259 Unspecified abnormal involuntary movements: Secondary | ICD-10-CM | POA: Diagnosis not present

## 2016-06-01 DIAGNOSIS — S199XXA Unspecified injury of neck, initial encounter: Secondary | ICD-10-CM | POA: Diagnosis not present

## 2016-06-01 DIAGNOSIS — R2689 Other abnormalities of gait and mobility: Secondary | ICD-10-CM | POA: Diagnosis not present

## 2016-06-01 DIAGNOSIS — S0990XA Unspecified injury of head, initial encounter: Secondary | ICD-10-CM | POA: Diagnosis not present

## 2016-06-01 DIAGNOSIS — S01111A Laceration without foreign body of right eyelid and periocular area, initial encounter: Secondary | ICD-10-CM | POA: Diagnosis not present

## 2016-06-01 NOTE — ED Notes (Signed)
Patient transported to CT 

## 2016-06-01 NOTE — Discharge Instructions (Signed)

## 2016-06-01 NOTE — ED Notes (Signed)
[facility] here to receive patient

## 2016-06-07 ENCOUNTER — Ambulatory Visit (INDEPENDENT_AMBULATORY_CARE_PROVIDER_SITE_OTHER): Payer: Medicare Other | Admitting: Neurology

## 2016-06-07 ENCOUNTER — Encounter: Payer: Self-pay | Admitting: Neurology

## 2016-06-07 VITALS — BP 132/63 | HR 59 | Ht 67.0 in | Wt 157.8 lb

## 2016-06-07 DIAGNOSIS — R269 Unspecified abnormalities of gait and mobility: Secondary | ICD-10-CM | POA: Diagnosis not present

## 2016-06-07 DIAGNOSIS — F02818 Dementia in other diseases classified elsewhere, unspecified severity, with other behavioral disturbance: Secondary | ICD-10-CM

## 2016-06-07 DIAGNOSIS — G301 Alzheimer's disease with late onset: Secondary | ICD-10-CM | POA: Diagnosis not present

## 2016-06-07 DIAGNOSIS — F0281 Dementia in other diseases classified elsewhere with behavioral disturbance: Secondary | ICD-10-CM | POA: Diagnosis not present

## 2016-06-07 HISTORY — DX: Unspecified abnormalities of gait and mobility: R26.9

## 2016-06-07 NOTE — Progress Notes (Signed)
Reason for visit: Alzheimer's disease  Bryan Moore is an 82 y.o. male  History of present illness:  Mr. Earll is a 82 year old right-handed white male with a history of a progressive dementing illness. In the past he could not tolerate Aricept or Namenda. The patient has recently had a fall, he went to the emergency room on 05/31/2016. The patient had a CT scan of the brain that did not show any intracranial trauma. The patient does have a moderate level of small vessel ischemic changes, with bilateral thalamic strokes in the past. The patient has had a change in his walking over the last 6 months. The patient is shuffling his feet more, his right foot may freeze at times, he has a tendency to fall backwards. He has poor judgment, he will try to get up and walk on his own, he may fall. The patient continues to have delusional thinking, he believes that his parents are still alive. He may get agitated at times. He takes low-dose alprazolam on a scheduled basis taking 0.25 mg 3 times daily. He is on Celexa 20 mg daily. He returns for an evaluation.  Past Medical History:  Diagnosis Date  . Anemia    from AVM's. Requiring transfusion  . Aortic stenosis    s/p TAVI July 2012  . Arthritis   . Balance problem    WEAK ON RIGHT SIDE ( SCOLISOSIS AND HX OF A BACK SURGERY) - BALANCE PROBLEM - "FALLS BACKWARD" - FREQUENT FALLS.  USES CANE WHEN AMBULATING  . Cancer (HCC)    BLADDER CANCER  . Chronic kidney disease   . Complication of anesthesia    VERY DROWSY FOR HOURS AFTER SURGERY - AND DISORIENTATION  . Dementia    more memory problems from stroke  . Fall   . GERD (gastroesophageal reflux disease)    RARE - AND NO MEDS  . GI bleed   . Glaucoma   . Glaucoma   . Heart murmur    PT'S CARDIOLOGIST IS DR. Acie Fredrickson -LAST OFFICE VISIT 03/02/13 IN [facility]  . History of blood transfusion LAST 2-3 YRS AGO   HAD 4 OR 5   . Hypercholesteremia   . Hyperlipemia   . Hypertension    Not taking  Lisinopril due to BP drops low.  . Mitral regurgitation   . PONV (postoperative nausea and vomiting)    IN PAST, DID WELL WITH LAST SURGERY  . Shingles May 2013   right arm residual pain- last outbreak 1 1/2 yrs ago.  . Stroke ([place])    left internal capsule 1/15  . Transfusion history    last 2 yrs ago.    Past Surgical History:  Procedure Laterality Date  . APPENDECTOMY    . BACK SURGERY  2010 OR 2011   LOWER  . CARDIAC CATHETERIZATION  10/23/2010   ef 65%  . CARDIAC VALVE REPLACEMENT     02/2011([facility])  . CARDIOVASCULAR STRESS TEST  01/01/2007   EF 64%, NO EVIDENCE OF ISCHEMIA  . CYSTOSCOPY W/ RETROGRADES Bilateral 09/17/2013   Procedure: CYSTOSCOPY WITH BILATERAL RETROGRADE;  Surgeon: [name], MD;  Location: [facility];  Service: Urology;  Laterality: Bilateral;  . CYSTOSCOPY W/ RETROGRADES Bilateral 11/15/2015   Procedure: CYSTO, BILATERAL RETROGRADE PYELOGRAM WITH BLADDER BIOPSY;  Surgeon: [name], MD;  [facility];  Service: Urology;  Laterality: Bilateral;  . CYSTOSCOPY WITH STENT PLACEMENT Left 04/13/2013   Procedure:  LEFT URETERAL STENT PLACEMENT;  Surgeon: Marshall Cork  [name], MD;  [facility];  Service: Urology;  Laterality: Left;  . EYE SURGERY     bilateral cataracts with lens implants  . TRANSURETHRAL RESECTION OF BLADDER TUMOR N/A 06/04/2013   Procedure: RESTAGING TRANSURETHRAL RESECTION OF BLADDER TUMOR (TURBT), URETERAL CATHERIZATION ;  Surgeon: [name], MD;  [facility];  Service: Urology;  Laterality: N/A;  CYSTO RESTAGING TURBT     . TRANSURETHRAL RESECTION OF BLADDER TUMOR N/A 09/17/2013   Procedure: TRANSURETHRAL RESECTION OF BLADDER TUMOR WITH MITOMYCIN-C;  Surgeon: [name], MD;  [facility];  Service: Urology;  Laterality: N/A;  . TRANSURETHRAL RESECTION OF BLADDER TUMOR WITH GYRUS (TURBT-GYRUS) N/A 04/13/2013   Procedure: TRANSURETHRAL RESECTION OF BLADDER TUMOR WITH GYRUS (TURBT-GYRUS);  Surgeon: [name], MD;  [facility];   Service: Urology;  Laterality: N/A;  . US ECHOCARDIOGRAPHY  08/31/10   EF 55-60%    Family History  Problem Relation Age of Onset  . COPD Father   . Stroke Father   . Heart attack Brother   . Parkinson's disease Brother     Social history:  reports that he quit smoking about 34 years ago. He has never used smokeless tobacco. He reports that he drinks about 0.6 oz of alcohol per week . He reports that he does not use drugs.    Allergies  Allergen Reactions  . Advil [Ibuprofen] Other (See Comments)    Causes stomach bleeding, need transfusions as result  . Aspirin Other (See Comments)    Causes stomach bleeding, needs transfusion as result  . Penicillins Itching and Swelling    Has patient had a PCN reaction causing immediate rash, facial/tongue/throat swelling, SOB or lightheadedness with hypotension: Unsure Has patient had a PCN reaction causing severe rash involving mucus membranes or skin necrosis: Unsure  Has patient had a PCN reaction that required hospitalization Yes Has patient had a PCN reaction occurring within the last 10 years: No If all of the above answers are "NO", then may proceed with Cephalosporin use.  . Atorvastatin     Joint ache  . Namenda [Memantine]     Stomach pain  . Other Other (See Comments)    No blood thinners due to GI bleed.  . Ativan [Lorazepam] Anxiety    He became very anxious and aggitated    Medications:  Prior to Admission medications   Medication Sig Start Date End Date Taking? Authorizing Provider  acetaminophen (TYLENOL) 500 MG tablet Take 500 mg by mouth every 6 (six) hours as needed for mild pain, moderate pain or headache.   Yes Historical Provider, MD  ALPRAZolam Duanne Moron) 0.5 MG tablet Take 0.25 mg by mouth 3 (three) times daily as needed.  05/24/16  Yes Historical Provider, MD  amLODipine (NORVASC) 5 MG tablet Take 1 tablet (5 mg total) by mouth daily. 11/05/13  Yes Ivan Anchors Love, PA-C  cholecalciferol (VITAMIN D) 1000 units tablet  Take 1,000 Units by mouth daily.   Yes Historical Provider, MD  citalopram (CELEXA) 20 MG tablet Take 1 tablet (20 mg total) by mouth daily. 02/23/16  Yes Kathrynn Ducking, MD  dorzolamide-timolol (COSOPT) 22.3-6.8 MG/ML ophthalmic solution Place 1 drop into both eyes every 12 (twelve) hours.  04/02/11  Yes Historical Provider, MD  iron polysaccharides (NIFEREX) 150 MG capsule Take 150 mg by mouth every morning.    Yes Historical Provider, MD  latanoprost (XALATAN) 0.005 % ophthalmic solution Place 1 drop into both eyes at bedtime. 10/11/15  Yes Historical Provider,  MD  Multiple Vitamin (MULTIVITAMIN) tablet Take 1 tablet by mouth daily.   Yes Historical Provider, MD  Phosphatidylserine-DHA-EPA (VAYACOG) 100-19.5-6.5 MG CAPS Take 1 tablet by mouth daily. 03/21/15  Yes Kathrynn Ducking, MD  vitamin B-12 (CYANOCOBALAMIN) 1000 MCG tablet Take 1,000 mcg by mouth daily.   Yes Historical Provider, MD    ROS:  Out of a complete 14 system review of symptoms, the patient complains only of the following symptoms, and all other reviewed systems are negative.  Decreased activity Difficulty swallowing Black stools Daytime sleepiness, snoring, sleep talking Urinary urgency Walking difficulty Bruising easily Dizziness, weakness, tremors Confusion, nervousness, hallucinations  Blood pressure 132/63, pulse (!) 59, height 5\' 7"  (1.702 m).  Physical Exam  General: The patient is alert and cooperative at the time of the examination.  Skin: No significant peripheral edema is noted.   Neurologic Exam  Mental status: The patient is alert and oriented x 2 at the time of the examination (not oriented to date). The Mini-Mental Status Examination done today shows a total score of 16/30.   Cranial nerves: Facial symmetry is present. Speech is normal, no aphasia or dysarthria is noted. Extraocular movements are full. Visual fields are full.  Motor: The patient has good strength in all 4 extremities.  Sensory  examination: Soft touch sensation is symmetric on the face, arms, and legs.  Coordination: The patient has good finger-nose-finger and heel-to-shin bilaterally. Some apraxia with the use of extremities is noted.  Gait and station: The patient has a minimally wide based, shuffling gait. Tandem gait was not attempted. The patient does have a tendency to lean backwards. Romberg is negative. No drift is seen.  Reflexes: Deep tendon reflexes are symmetric.   CT head 06/01/16:  IMPRESSION: 1. No CT evidence for acute intracranial abnormality. Other chronic changes as previously described. 2. Large right forehead scalp laceration hematoma 3. No acute fracture is visualized. Slight asymmetric prominence of right C3-C4 facet compared to previous exam, possibly due to degenerative change. If persistent pain or concern for ligamentous injury, MRI may be considered. 4. Severe degenerative changes of the cervical spine. 5. Aberrant origin right subclavian artery with retroesophageal Course.  * CT scan images were reviewed online. I agree with the written report.    Assessment/Plan:  1. Alzheimer's disease  2. Gait disorder  The patient is developing increasing problems with walking. This likely is related to advanced age, dementia, and cerebrovascular disease. We will need to watch for signs of developing parkinsonism, but the use of Sinemet in this patient may have significant adverse effects with recent confusion and hallucinations. The patient has poor judgment, he will try to walk on his own. He has a high-risk for falls. The patient will follow-up in about 6 months. I have recommended getting a bed alarm to alert the caretakers that he is trying to get up.  Jill Alexanders MD 06/07/2016 11:54 AM  [facility] [address] [place], [place] [address]  Phone [phone] Fax [phone]

## 2016-09-14 DIAGNOSIS — M2041 Other hammer toe(s) (acquired), right foot: Secondary | ICD-10-CM | POA: Diagnosis not present

## 2016-09-14 DIAGNOSIS — B351 Tinea unguium: Secondary | ICD-10-CM | POA: Diagnosis not present

## 2016-09-18 ENCOUNTER — Encounter: Payer: Self-pay | Admitting: Internal Medicine

## 2016-09-18 ENCOUNTER — Non-Acute Institutional Stay: Payer: Medicare Other | Admitting: Internal Medicine

## 2016-09-18 VITALS — BP 122/80 | HR 74 | Temp 97.6°F | Resp 20 | Ht 67.0 in | Wt 155.4 lb

## 2016-09-18 DIAGNOSIS — R296 Repeated falls: Secondary | ICD-10-CM | POA: Diagnosis not present

## 2016-09-18 DIAGNOSIS — F02818 Dementia in other diseases classified elsewhere, unspecified severity, with other behavioral disturbance: Secondary | ICD-10-CM

## 2016-09-18 DIAGNOSIS — D500 Iron deficiency anemia secondary to blood loss (chronic): Secondary | ICD-10-CM

## 2016-09-18 DIAGNOSIS — K31819 Angiodysplasia of stomach and duodenum without bleeding: Secondary | ICD-10-CM

## 2016-09-18 DIAGNOSIS — Z8673 Personal history of transient ischemic attack (TIA), and cerebral infarction without residual deficits: Secondary | ICD-10-CM

## 2016-09-18 DIAGNOSIS — Q2733 Arteriovenous malformation of digestive system vessel: Secondary | ICD-10-CM

## 2016-09-18 DIAGNOSIS — I10 Essential (primary) hypertension: Secondary | ICD-10-CM

## 2016-09-18 DIAGNOSIS — E785 Hyperlipidemia, unspecified: Secondary | ICD-10-CM | POA: Diagnosis not present

## 2016-09-18 DIAGNOSIS — F0281 Dementia in other diseases classified elsewhere with behavioral disturbance: Secondary | ICD-10-CM

## 2016-09-18 DIAGNOSIS — G301 Alzheimer's disease with late onset: Secondary | ICD-10-CM | POA: Diagnosis not present

## 2016-09-18 DIAGNOSIS — C679 Malignant neoplasm of bladder, unspecified: Secondary | ICD-10-CM | POA: Diagnosis not present

## 2016-09-18 DIAGNOSIS — R269 Unspecified abnormalities of gait and mobility: Secondary | ICD-10-CM | POA: Diagnosis not present

## 2016-09-18 DIAGNOSIS — F22 Delusional disorders: Secondary | ICD-10-CM

## 2016-09-18 MED ORDER — ALPRAZOLAM 0.5 MG PO TABS: 0.2500 mg | ORAL_TABLET | Freq: Three times a day (TID) | ORAL | 3 refills | Status: DC | PRN

## 2016-09-18 NOTE — Assessment & Plan Note (Signed)
Balance problems. Falls backwards. Weak legs.

## 2016-09-18 NOTE — Progress Notes (Signed)
HISTORY AND PHYSICAL  Location:    [place]   Place of Service: Clinic (12)   Extended Emergency Contact Information Primary Emergency Contact: [facility] Address: [address] [place]          [place], [place] [address] [name] of [place] Phone: [phone] Mobile Phone: [phone] Relation: Spouse Secondary Emergency Contact: [place] , [place] Phone: [phone] Mobile Phone: [phone] Relation: Son  Advanced Directive information Does Patient Have a Medical Advance Directive?: Yes, Type of Advance Directive: Healthcare Power of [place], Does patient want to make changes to medical advance directive?: No - Patient declined  Chief Complaint  Patient presents with  . Establish Care    HPI:  New patient. Prior PCP was Dr. Elyse Hsu. Changing due to convenience of the clinic at [facility]. Patient and wife recently moved to [facility]. She is suffering from some "caregiver burnout" despite having some caregiver assistance.   Patient can be impulsive. He still imagines he can walk safely and will suddenly get our of his wheelchair of bed and then fall. He wakes with confusion, delusions, and hallucinations. Wife says she cannot sleep and he is always asking her for assistance.  Balance is terrible  BP has been an issue in the past, but is currently under control.  He had Aortic valve replacement for aortic stenosis.  Dr. [name] follows his bladder cancer with cystoscopy about every 6 months.    Past Medical History:  Diagnosis Date  . Abnormality of gait 06/07/2016  . Anemia    from AVM's. Requiring transfusion  . Aortic stenosis    s/p TAVI July 2012  . Arthritis   . Balance problem    WEAK ON RIGHT SIDE ( SCOLISOSIS AND HX OF A BACK SURGERY) - BALANCE PROBLEM - "FALLS BACKWARD" - FREQUENT FALLS.  USES CANE WHEN AMBULATING  . Cancer (HCC)    BLADDER CANCER  . Chronic kidney disease   . Complication  of anesthesia    VERY DROWSY FOR HOURS AFTER SURGERY - AND DISORIENTATION  . Dementia    more memory problems from stroke  . Fall   . GERD (gastroesophageal reflux disease)    RARE - AND NO MEDS  . GI bleed   . Glaucoma   . Glaucoma   . Heart murmur    PT'S CARDIOLOGIST IS DR. Acie Fredrickson -LAST OFFICE VISIT 03/02/13 IN [facility]  . History of blood transfusion LAST 2-3 YRS AGO   HAD 4 OR 5   . Hypercholesteremia   . Hyperlipemia   . Hypertension    Not taking Lisinopril due to BP drops low.  . Mitral regurgitation   . PONV (postoperative nausea and vomiting)    IN PAST, DID WELL WITH LAST SURGERY  . Shingles May 2013   right arm residual pain- last outbreak 1 1/2 yrs ago.  . Stroke ([place])    left internal capsule 1/15  . Transfusion history    last 2 yrs ago.    Past Surgical History:  Procedure Laterality Date  . APPENDECTOMY    . BACK SURGERY  2010 OR 2011   LOWER  . CARDIAC CATHETERIZATION  10/23/2010   ef 65%  . CARDIAC VALVE REPLACEMENT     02/2011([facility])  . CARDIOVASCULAR STRESS TEST  01/01/2007   EF 64%, NO EVIDENCE OF ISCHEMIA  . CYSTOSCOPY W/ RETROGRADES Bilateral 09/17/2013   Procedure: CYSTOSCOPY WITH BILATERAL  RETROGRADE;  Surgeon: [name], MD;  [facility];  Service: Urology;  Laterality: Bilateral;  . CYSTOSCOPY W/ RETROGRADES Bilateral 11/15/2015   Procedure: CYSTO, BILATERAL RETROGRADE PYELOGRAM WITH BLADDER BIOPSY;  Surgeon: [name], MD;  [facility];  Service: Urology;  Laterality: Bilateral;  . CYSTOSCOPY WITH STENT PLACEMENT Left 04/13/2013   Procedure:  LEFT URETERAL STENT PLACEMENT;  Surgeon: [name], MD;  [facility];  Service: Urology;  Laterality: Left;  . EYE SURGERY     bilateral cataracts with lens implants  . TRANSURETHRAL RESECTION OF BLADDER TUMOR N/A 06/04/2013   Procedure: RESTAGING TRANSURETHRAL RESECTION OF BLADDER TUMOR (TURBT), URETERAL CATHERIZATION ;  Surgeon: [name], MD;  [facility];  Service: Urology;   Laterality: N/A;  CYSTO RESTAGING TURBT     . TRANSURETHRAL RESECTION OF BLADDER TUMOR N/A 09/17/2013   Procedure: TRANSURETHRAL RESECTION OF BLADDER TUMOR WITH MITOMYCIN-C;  Surgeon: [name], MD;  [facility];  Service: Urology;  Laterality: N/A;  . TRANSURETHRAL RESECTION OF BLADDER TUMOR WITH GYRUS (TURBT-GYRUS) N/A 04/13/2013   Procedure: TRANSURETHRAL RESECTION OF BLADDER TUMOR WITH GYRUS (TURBT-GYRUS);  Surgeon: [name], MD;  [facility];  Service: Urology;  Laterality: N/A;  . US ECHOCARDIOGRAPHY  08/31/10   EF 55-60%    Patient Care Team: Lorne Skeens, MD as PCP - General (Endocrinology) Ronald Lobo, MD (Gastroenterology) Rolm Bookbinder, MD as Consulting Physician (Dermatology) Thayer Headings, MD as Consulting Physician (Cardiology) Kathrynn Ducking, MD as Consulting Physician (Neurology) [name], MD as Attending Physician (Urology)  Social History   Social History  . Marital status: Married    Spouse name: N/A  . Number of children: 2  . Years of education: College   Occupational History  . retired Hotel manager     Social History Main Topics  . Smoking status: Former Smoker    Types: Pipe    Quit date: 07/04/1981  . Smokeless tobacco: Never Used  . Alcohol use 0.6 oz/week    1 Glasses of wine per week  . Drug use: No  . Sexual activity: Not Currently   Other Topics Concern  . Not on file   Social History Narrative   L[facility] at [facility] 03/13/2016   Patient lives at home with his wife has 2 children.   Patient is left handed.   Patient has college education.   Patient drink 4 cups daily.   Smoked pipe 20-35 years    Alcohol 1 glass of wine per day    Exercise no          reports that he quit smoking about 35 years ago. His smoking use included Pipe. He has never used smokeless tobacco. He reports that he drinks about 0.6 oz of alcohol per week . He reports that he does not use drugs.  Family History  Problem Relation Age of  Onset  . COPD Father   . Stroke Father   . Parkinson's disease Brother 82  . Heart failure Mother   . Bowel Disease Sister 13   Family Status  Relation Status  . Father Deceased   natural causes  . Brother Deceased  . Mother Deceased at age 86's   CHF  . Sister Deceased   bowel obstruction  . Daughter Alive  . Son Alive    Immunization History  Administered Date(s) Administered  . Influenza Nasal 05/13/2016  . Pneumococcal-Unspecified 03/07/2011  . Tdap 02/18/2015, 06/01/2016    Allergies  Allergen Reactions  . Advil [Ibuprofen] Other (See Comments)    Causes stomach bleeding, need transfusions as result  . Aspirin Other (See Comments)    Causes stomach bleeding, needs transfusion as result  . Penicillins Itching and Swelling    Has patient had a PCN reaction causing immediate rash, facial/tongue/throat swelling, SOB or lightheadedness with hypotension: Unsure Has patient had a PCN reaction causing severe rash involving mucus membranes or skin necrosis: Unsure  Has patient had a PCN reaction that required hospitalization Yes Has patient had a PCN reaction occurring within the last 10 years: No If all of the above answers are "NO", then may proceed with Cephalosporin use.  . Atorvastatin     Joint ache  . Namenda [Memantine]     Stomach pain  . Other Other (See Comments)    No blood thinners due to GI bleed.  . Ativan [Lorazepam] Anxiety    He became very anxious and aggitated    Medications: Patient's Medications  New Prescriptions   No medications on file  Previous Medications   ACETAMINOPHEN (TYLENOL) 500 MG TABLET    Take 500 mg by mouth every 6 (six) hours as needed for mild pain, moderate pain or headache.   ALPRAZOLAM (XANAX) 0.5 MG TABLET    Take 0.25 mg by mouth 3 (three) times daily as needed.    AMLODIPINE (NORVASC) 5 MG TABLET    Take 1 tablet (5 mg total) by mouth daily.   CHOLECALCIFEROL (VITAMIN D) 1000 UNITS TABLET    Take 1,000 Units by mouth  daily.   CITALOPRAM (CELEXA) 20 MG TABLET    Take 1 tablet (20 mg total) by mouth daily.   IRON POLYSACCHARIDES (NIFEREX) 150 MG CAPSULE    Take 150 mg by mouth every morning.    LATANOPROST (XALATAN) 0.005 % OPHTHALMIC SOLUTION    Place 1 drop into both eyes at bedtime.   MULTIPLE VITAMIN (MULTIVITAMIN) TABLET    Take 1 tablet by mouth daily.   VITAMIN B-12 (CYANOCOBALAMIN) 1000 MCG TABLET    Take 1,000 mcg by mouth daily.  Modified Medications   No medications on file  Discontinued Medications   DORZOLAMIDE-TIMOLOL (COSOPT) 22.3-6.8 MG/ML OPHTHALMIC SOLUTION    Place 1 drop into both eyes every 12 (twelve) hours.     Review of Systems  Constitutional: Positive for fatigue. Negative for activity change, appetite change, fever and unexpected weight change.       Weak. Frail  HENT: Positive for hearing loss. Negative for congestion, ear pain, rhinorrhea, sore throat, tinnitus, trouble swallowing and voice change.        Dentures  Eyes:       Glaucoma  Respiratory: Negative for cough, choking, chest tightness, shortness of breath and wheezing.   Cardiovascular: Negative for chest pain, palpitations and leg swelling.       Hx aortic valve replacement  Gastrointestinal: Negative for abdominal distention, abdominal pain, constipation, diarrhea and nausea.       Hx UGI bleed from AVM. Hx colon polyps.  Endocrine: Negative for cold intolerance, heat intolerance, polydipsia, polyphagia and polyuria.  Genitourinary: Negative for dysuria, frequency, testicular pain and urgency.       Hx bladder cancer. Cystoscopy about q64mo  Musculoskeletal: Positive for gait problem (poor balance). Negative for arthralgias, back pain, myalgias and neck pain.  Skin: Negative for color change, pallor and rash.  Allergic/Immunologic: Negative.   Neurological: Positive for tremors (mild ansd bilateral). Negative for dizziness, syncope, speech difficulty, weakness, numbness and  headaches.       Dementia    Hematological: Negative for adenopathy. Does not bruise/bleed easily.  Psychiatric/Behavioral: Positive for agitation (eoisodic), behavioral problems, confusion, decreased concentration and hallucinations. Negative for sleep disturbance. The patient is nervous/anxious.     Vitals:   09/18/16 0953  BP: 122/80  Pulse: 74  Resp: 20  Temp: 97.6 F (36.4 C)  SpO2: 95%  Weight: 155 lb 6.4 oz (70.5 kg)  Height: 5' 7"  (1.702 m)   Body mass index is 24.34 kg/m. Filed Weights   09/18/16 0953  Weight: 155 lb 6.4 oz (70.5 kg)     Physical Exam  Constitutional: He appears well-developed and well-nourished. No distress.  Elderly and frail;  HENT:  Right Ear: External ear normal.  Left Ear: External ear normal.  Nose: Nose normal.  Mouth/Throat: Oropharynx is clear and moist. No oropharyngeal exudate.  Bilateral loss of hearing and aid in the left ear.  Eyes: Conjunctivae and EOM are normal. Pupils are equal, round, and reactive to light.  Small pupils  Neck: No JVD present. No tracheal deviation present. No thyromegaly present.  Cardiovascular: Normal rate, regular rhythm, normal heart sounds and intact distal pulses.  Exam reveals no gallop and no friction rub.   No murmur heard. Pulmonary/Chest: No respiratory distress. He has no wheezes. He has no rales. He exhibits no tenderness.  Abdominal: He exhibits no distension and no mass. There is no tenderness.  Musculoskeletal: Normal range of motion. He exhibits no edema or tenderness.  Lymphadenopathy:    He has no cervical adenopathy.  Neurological: He is alert. He has normal reflexes. No cranial nerve deficit. Coordination abnormal.  02/19/16 MMSE 10/30. Failed clock drawing.  Skin: No rash noted. No erythema. No pallor.  Psychiatric: He has a normal mood and affect. His behavior is normal. Thought content normal.    Labs reviewed: Lab Summary Latest Ref Rng & Units 11/08/2015 02/23/2015 02/23/2015  Hemoglobin 13.0 - 17.0 g/dL  11.6(L) 11.6(L) 10.9(L)  Hematocrit 39.0 - 52.0 % 35.1(L) 34.0(L) 32.8(L)  White count 4.0 - 10.5 K/uL 4.2 (None) 4.5  Platelet count 150 - 400 K/uL 169 (None) 138(L)  Sodium 135 - 145 mmol/L 143 141 140  Potassium 3.5 - 5.1 mmol/L 4.7 4.2 4.3  Calcium 8.9 - 10.3 mg/dL 9.3 (None) 9.0  Phosphorus - (None) (None) (None)  Creatinine 0.61 - 1.24 mg/dL 1.65(H) 1.40(H) 1.46(H)  AST 15 - 41 U/L (None) (None) 17  Alk Phos 38 - 126 U/L (None) (None) 64  Bilirubin 0.3 - 1.2 mg/dL (None) (None) 0.4  Glucose 65 - 99 mg/dL 91 104(H) 105(H)  Cholesterol - (None) (None) (None)  HDL cholesterol - (None) (None) (None)  Triglycerides - (None) (None) (None)  LDL Direct - (None) (None) (None)  LDL Calc - (None) (None) (None)  Total protein 6.5 - 8.1 g/dL (None) (None) 7.6  Albumin 3.5 - 5.0 g/dL (None) (None) 3.4(L)  Some recent data might be hidden   Lab Results  Component Value Date   BUN 27 (H) 11/08/2015   Lab Results  Component Value Date   HGBA1C 5.2 08/19/2013   Lab Results  Component Value Date   TSH 2.240 11/17/2013     Assessment/Plan  1. Frequent falls PT eval. Falls are likely related to balance disorder related to prior CVA and dementia  2. Gastric AVM Inactive problem at this time except that he continues to have a chronic  Anemia and takes iron.  3. Late onset Alzheimer's disease  with behavioral disturbance Severe dementia that seems to me to be a combination of Alzheimer's and vascular disease.  4. Iron deficiency anemia due to chronic blood loss - CBC with Differential/Platelet; Future  5. Delusional disorder [facility]) -lengthy discussion with wife regarding whether the Celexa should be stopped and something else prescribed.  -Start sertraline 50 mg nightly - ALPRAZolam (XANAX) 0.5 MG tablet; Take 0.5 tablets (0.25 mg total) by mouth 3 (three) times daily as needed.  Dispense: 90 tablet; Refill: 3  6. History of cerebral infarction Likely contributes to gait  instability and peresonality changes, but the hx is also suggestive of Alzheimer's.  7. Abnormality of gait -PT eval for strengthening and safe mobility  8. Malignant neoplasm of urinary bladder, unspecified site [facility]) Continue routine follow u with urology, drt. wrenn  9. Hypertension, unspecified type controlled - Comprehensive metabolic panel; Future  10. Hyperlipidemia, unspecified hyperlipidemia type - Lipid panel; Future

## 2016-09-24 ENCOUNTER — Other Ambulatory Visit: Payer: Medicare Other

## 2016-09-24 DIAGNOSIS — D500 Iron deficiency anemia secondary to blood loss (chronic): Secondary | ICD-10-CM | POA: Diagnosis not present

## 2016-09-24 DIAGNOSIS — E785 Hyperlipidemia, unspecified: Secondary | ICD-10-CM

## 2016-09-24 DIAGNOSIS — I10 Essential (primary) hypertension: Secondary | ICD-10-CM | POA: Diagnosis not present

## 2016-09-24 LAB — COMPREHENSIVE METABOLIC PANEL
ALBUMIN: 3.9 g/dL (ref 3.6–5.1)
ALK PHOS: 67 U/L (ref 40–115)
ALT: 9 U/L (ref 9–46)
AST: 17 U/L (ref 10–35)
BILIRUBIN TOTAL: 0.4 mg/dL (ref 0.2–1.2)
BUN: 22 mg/dL (ref 7–25)
CALCIUM: 9.2 mg/dL (ref 8.6–10.3)
CO2: 26 mmol/L (ref 20–31)
CREATININE: 1.65 mg/dL — AB (ref 0.70–1.11)
Chloride: 105 mmol/L (ref 98–110)
Glucose, Bld: 95 mg/dL (ref 65–99)
Potassium: 4.4 mmol/L (ref 3.5–5.3)
SODIUM: 140 mmol/L (ref 135–146)
TOTAL PROTEIN: 7.4 g/dL (ref 6.1–8.1)

## 2016-09-24 LAB — LIPID PANEL
CHOL/HDL RATIO: 3.6 ratio (ref <5.0)
CHOLESTEROL: 167 mg/dL (ref <200)
HDL: 46 mg/dL (ref >40)
LDL Cholesterol: 98 mg/dL (ref <100)
Triglycerides: 114 mg/dL (ref <150)
VLDL: 23 mg/dL (ref <30)

## 2016-09-24 LAB — CBC WITH DIFFERENTIAL/PLATELET
BASOS ABS: 0 {cells}/uL (ref 0–200)
Basophils Relative: 0 %
EOS PCT: 2 %
Eosinophils Absolute: 106 cells/uL (ref 15–500)
HCT: 36.5 % — ABNORMAL LOW (ref 38.5–50.0)
Hemoglobin: 12.1 g/dL — ABNORMAL LOW (ref 13.2–17.1)
LYMPHS PCT: 11 %
Lymphs Abs: 583 cells/uL — ABNORMAL LOW (ref 850–3900)
MCH: 31.3 pg (ref 27.0–33.0)
MCHC: 33.2 g/dL (ref 32.0–36.0)
MCV: 94.6 fL (ref 80.0–100.0)
MONOS PCT: 8 %
MPV: 9.7 fL (ref 7.5–12.5)
Monocytes Absolute: 424 cells/uL (ref 200–950)
NEUTROS ABS: 4187 {cells}/uL (ref 1500–7800)
Neutrophils Relative %: 79 %
PLATELETS: 213 10*3/uL (ref 140–400)
RBC: 3.86 MIL/uL — ABNORMAL LOW (ref 4.20–5.80)
RDW: 15.8 % — AB (ref 11.0–15.0)
WBC: 5.3 10*3/uL (ref 3.8–10.8)

## 2016-09-25 DIAGNOSIS — R296 Repeated falls: Secondary | ICD-10-CM | POA: Diagnosis not present

## 2016-09-25 DIAGNOSIS — E638 Other specified nutritional deficiencies: Secondary | ICD-10-CM | POA: Diagnosis not present

## 2016-09-25 DIAGNOSIS — G468 Other vascular syndromes of brain in cerebrovascular diseases: Secondary | ICD-10-CM | POA: Diagnosis not present

## 2016-09-25 DIAGNOSIS — D509 Iron deficiency anemia, unspecified: Secondary | ICD-10-CM | POA: Diagnosis not present

## 2016-09-25 DIAGNOSIS — N184 Chronic kidney disease, stage 4 (severe): Secondary | ICD-10-CM | POA: Diagnosis not present

## 2016-09-25 DIAGNOSIS — M6281 Muscle weakness (generalized): Secondary | ICD-10-CM | POA: Diagnosis not present

## 2016-09-25 DIAGNOSIS — R2681 Unsteadiness on feet: Secondary | ICD-10-CM | POA: Diagnosis not present

## 2016-09-25 DIAGNOSIS — R42 Dizziness and giddiness: Secondary | ICD-10-CM | POA: Diagnosis not present

## 2016-09-25 DIAGNOSIS — C679 Malignant neoplasm of bladder, unspecified: Secondary | ICD-10-CM | POA: Diagnosis not present

## 2016-09-25 DIAGNOSIS — F0390 Unspecified dementia without behavioral disturbance: Secondary | ICD-10-CM | POA: Diagnosis not present

## 2016-09-25 DIAGNOSIS — R262 Difficulty in walking, not elsewhere classified: Secondary | ICD-10-CM | POA: Diagnosis not present

## 2016-09-25 DIAGNOSIS — Z952 Presence of prosthetic heart valve: Secondary | ICD-10-CM | POA: Diagnosis not present

## 2016-09-25 DIAGNOSIS — E7800 Pure hypercholesterolemia, unspecified: Secondary | ICD-10-CM | POA: Diagnosis not present

## 2016-09-25 DIAGNOSIS — N401 Enlarged prostate with lower urinary tract symptoms: Secondary | ICD-10-CM | POA: Diagnosis not present

## 2016-09-25 DIAGNOSIS — I10 Essential (primary) hypertension: Secondary | ICD-10-CM | POA: Diagnosis not present

## 2016-09-25 NOTE — Addendum Note (Signed)
Addended by: Estill Dooms on: 09/25/2016 11:33 PM   Modules accepted: Level of Service

## 2016-10-02 DIAGNOSIS — R262 Difficulty in walking, not elsewhere classified: Secondary | ICD-10-CM | POA: Diagnosis not present

## 2016-10-02 DIAGNOSIS — G468 Other vascular syndromes of brain in cerebrovascular diseases: Secondary | ICD-10-CM | POA: Diagnosis not present

## 2016-10-02 DIAGNOSIS — Z952 Presence of prosthetic heart valve: Secondary | ICD-10-CM | POA: Diagnosis not present

## 2016-10-02 DIAGNOSIS — R2681 Unsteadiness on feet: Secondary | ICD-10-CM | POA: Diagnosis not present

## 2016-10-02 DIAGNOSIS — R296 Repeated falls: Secondary | ICD-10-CM | POA: Diagnosis not present

## 2016-10-02 DIAGNOSIS — M6281 Muscle weakness (generalized): Secondary | ICD-10-CM | POA: Diagnosis not present

## 2016-10-03 DIAGNOSIS — G468 Other vascular syndromes of brain in cerebrovascular diseases: Secondary | ICD-10-CM | POA: Diagnosis not present

## 2016-10-03 DIAGNOSIS — Z952 Presence of prosthetic heart valve: Secondary | ICD-10-CM | POA: Diagnosis not present

## 2016-10-03 DIAGNOSIS — R296 Repeated falls: Secondary | ICD-10-CM | POA: Diagnosis not present

## 2016-10-03 DIAGNOSIS — M6281 Muscle weakness (generalized): Secondary | ICD-10-CM | POA: Diagnosis not present

## 2016-10-03 DIAGNOSIS — R2681 Unsteadiness on feet: Secondary | ICD-10-CM | POA: Diagnosis not present

## 2016-10-03 DIAGNOSIS — R262 Difficulty in walking, not elsewhere classified: Secondary | ICD-10-CM | POA: Diagnosis not present

## 2016-10-05 DIAGNOSIS — M6281 Muscle weakness (generalized): Secondary | ICD-10-CM | POA: Diagnosis not present

## 2016-10-05 DIAGNOSIS — R2681 Unsteadiness on feet: Secondary | ICD-10-CM | POA: Diagnosis not present

## 2016-10-05 DIAGNOSIS — E638 Other specified nutritional deficiencies: Secondary | ICD-10-CM | POA: Diagnosis not present

## 2016-10-05 DIAGNOSIS — R296 Repeated falls: Secondary | ICD-10-CM | POA: Diagnosis not present

## 2016-10-05 DIAGNOSIS — C679 Malignant neoplasm of bladder, unspecified: Secondary | ICD-10-CM | POA: Diagnosis not present

## 2016-10-05 DIAGNOSIS — R262 Difficulty in walking, not elsewhere classified: Secondary | ICD-10-CM | POA: Diagnosis not present

## 2016-10-05 DIAGNOSIS — R42 Dizziness and giddiness: Secondary | ICD-10-CM | POA: Diagnosis not present

## 2016-10-05 DIAGNOSIS — N184 Chronic kidney disease, stage 4 (severe): Secondary | ICD-10-CM | POA: Diagnosis not present

## 2016-10-05 DIAGNOSIS — F0390 Unspecified dementia without behavioral disturbance: Secondary | ICD-10-CM | POA: Diagnosis not present

## 2016-10-05 DIAGNOSIS — D509 Iron deficiency anemia, unspecified: Secondary | ICD-10-CM | POA: Diagnosis not present

## 2016-10-05 DIAGNOSIS — I10 Essential (primary) hypertension: Secondary | ICD-10-CM | POA: Diagnosis not present

## 2016-10-05 DIAGNOSIS — G468 Other vascular syndromes of brain in cerebrovascular diseases: Secondary | ICD-10-CM | POA: Diagnosis not present

## 2016-10-05 DIAGNOSIS — Z952 Presence of prosthetic heart valve: Secondary | ICD-10-CM | POA: Diagnosis not present

## 2016-10-05 DIAGNOSIS — N401 Enlarged prostate with lower urinary tract symptoms: Secondary | ICD-10-CM | POA: Diagnosis not present

## 2016-10-05 DIAGNOSIS — E7800 Pure hypercholesterolemia, unspecified: Secondary | ICD-10-CM | POA: Diagnosis not present

## 2016-10-08 DIAGNOSIS — M6281 Muscle weakness (generalized): Secondary | ICD-10-CM | POA: Diagnosis not present

## 2016-10-08 DIAGNOSIS — G468 Other vascular syndromes of brain in cerebrovascular diseases: Secondary | ICD-10-CM | POA: Diagnosis not present

## 2016-10-08 DIAGNOSIS — Z952 Presence of prosthetic heart valve: Secondary | ICD-10-CM | POA: Diagnosis not present

## 2016-10-08 DIAGNOSIS — R2681 Unsteadiness on feet: Secondary | ICD-10-CM | POA: Diagnosis not present

## 2016-10-08 DIAGNOSIS — R262 Difficulty in walking, not elsewhere classified: Secondary | ICD-10-CM | POA: Diagnosis not present

## 2016-10-08 DIAGNOSIS — R296 Repeated falls: Secondary | ICD-10-CM | POA: Diagnosis not present

## 2016-10-10 DIAGNOSIS — Z952 Presence of prosthetic heart valve: Secondary | ICD-10-CM | POA: Diagnosis not present

## 2016-10-10 DIAGNOSIS — R262 Difficulty in walking, not elsewhere classified: Secondary | ICD-10-CM | POA: Diagnosis not present

## 2016-10-10 DIAGNOSIS — R2681 Unsteadiness on feet: Secondary | ICD-10-CM | POA: Diagnosis not present

## 2016-10-10 DIAGNOSIS — G468 Other vascular syndromes of brain in cerebrovascular diseases: Secondary | ICD-10-CM | POA: Diagnosis not present

## 2016-10-10 DIAGNOSIS — M6281 Muscle weakness (generalized): Secondary | ICD-10-CM | POA: Diagnosis not present

## 2016-10-10 DIAGNOSIS — R296 Repeated falls: Secondary | ICD-10-CM | POA: Diagnosis not present

## 2016-10-12 DIAGNOSIS — G468 Other vascular syndromes of brain in cerebrovascular diseases: Secondary | ICD-10-CM | POA: Diagnosis not present

## 2016-10-12 DIAGNOSIS — R2681 Unsteadiness on feet: Secondary | ICD-10-CM | POA: Diagnosis not present

## 2016-10-12 DIAGNOSIS — Z952 Presence of prosthetic heart valve: Secondary | ICD-10-CM | POA: Diagnosis not present

## 2016-10-12 DIAGNOSIS — R296 Repeated falls: Secondary | ICD-10-CM | POA: Diagnosis not present

## 2016-10-12 DIAGNOSIS — R262 Difficulty in walking, not elsewhere classified: Secondary | ICD-10-CM | POA: Diagnosis not present

## 2016-10-12 DIAGNOSIS — M6281 Muscle weakness (generalized): Secondary | ICD-10-CM | POA: Diagnosis not present

## 2016-10-17 DIAGNOSIS — R2681 Unsteadiness on feet: Secondary | ICD-10-CM | POA: Diagnosis not present

## 2016-10-17 DIAGNOSIS — G468 Other vascular syndromes of brain in cerebrovascular diseases: Secondary | ICD-10-CM | POA: Diagnosis not present

## 2016-10-17 DIAGNOSIS — Z952 Presence of prosthetic heart valve: Secondary | ICD-10-CM | POA: Diagnosis not present

## 2016-10-17 DIAGNOSIS — R296 Repeated falls: Secondary | ICD-10-CM | POA: Diagnosis not present

## 2016-10-17 DIAGNOSIS — R262 Difficulty in walking, not elsewhere classified: Secondary | ICD-10-CM | POA: Diagnosis not present

## 2016-10-17 DIAGNOSIS — M6281 Muscle weakness (generalized): Secondary | ICD-10-CM | POA: Diagnosis not present

## 2016-10-19 DIAGNOSIS — G468 Other vascular syndromes of brain in cerebrovascular diseases: Secondary | ICD-10-CM | POA: Diagnosis not present

## 2016-10-19 DIAGNOSIS — R296 Repeated falls: Secondary | ICD-10-CM | POA: Diagnosis not present

## 2016-10-19 DIAGNOSIS — Z952 Presence of prosthetic heart valve: Secondary | ICD-10-CM | POA: Diagnosis not present

## 2016-10-19 DIAGNOSIS — M6281 Muscle weakness (generalized): Secondary | ICD-10-CM | POA: Diagnosis not present

## 2016-10-19 DIAGNOSIS — R2681 Unsteadiness on feet: Secondary | ICD-10-CM | POA: Diagnosis not present

## 2016-10-19 DIAGNOSIS — R262 Difficulty in walking, not elsewhere classified: Secondary | ICD-10-CM | POA: Diagnosis not present

## 2016-10-23 DIAGNOSIS — R262 Difficulty in walking, not elsewhere classified: Secondary | ICD-10-CM | POA: Diagnosis not present

## 2016-10-23 DIAGNOSIS — Z952 Presence of prosthetic heart valve: Secondary | ICD-10-CM | POA: Diagnosis not present

## 2016-10-23 DIAGNOSIS — R296 Repeated falls: Secondary | ICD-10-CM | POA: Diagnosis not present

## 2016-10-23 DIAGNOSIS — M6281 Muscle weakness (generalized): Secondary | ICD-10-CM | POA: Diagnosis not present

## 2016-10-23 DIAGNOSIS — R2681 Unsteadiness on feet: Secondary | ICD-10-CM | POA: Diagnosis not present

## 2016-10-23 DIAGNOSIS — G468 Other vascular syndromes of brain in cerebrovascular diseases: Secondary | ICD-10-CM | POA: Diagnosis not present

## 2016-10-24 DIAGNOSIS — M6281 Muscle weakness (generalized): Secondary | ICD-10-CM | POA: Diagnosis not present

## 2016-10-24 DIAGNOSIS — Z952 Presence of prosthetic heart valve: Secondary | ICD-10-CM | POA: Diagnosis not present

## 2016-10-24 DIAGNOSIS — R2681 Unsteadiness on feet: Secondary | ICD-10-CM | POA: Diagnosis not present

## 2016-10-24 DIAGNOSIS — R262 Difficulty in walking, not elsewhere classified: Secondary | ICD-10-CM | POA: Diagnosis not present

## 2016-10-24 DIAGNOSIS — R296 Repeated falls: Secondary | ICD-10-CM | POA: Diagnosis not present

## 2016-10-24 DIAGNOSIS — G468 Other vascular syndromes of brain in cerebrovascular diseases: Secondary | ICD-10-CM | POA: Diagnosis not present

## 2016-10-26 DIAGNOSIS — R2681 Unsteadiness on feet: Secondary | ICD-10-CM | POA: Diagnosis not present

## 2016-10-26 DIAGNOSIS — Z952 Presence of prosthetic heart valve: Secondary | ICD-10-CM | POA: Diagnosis not present

## 2016-10-26 DIAGNOSIS — G468 Other vascular syndromes of brain in cerebrovascular diseases: Secondary | ICD-10-CM | POA: Diagnosis not present

## 2016-10-26 DIAGNOSIS — R262 Difficulty in walking, not elsewhere classified: Secondary | ICD-10-CM | POA: Diagnosis not present

## 2016-10-26 DIAGNOSIS — R296 Repeated falls: Secondary | ICD-10-CM | POA: Diagnosis not present

## 2016-10-26 DIAGNOSIS — M6281 Muscle weakness (generalized): Secondary | ICD-10-CM | POA: Diagnosis not present

## 2016-11-07 DIAGNOSIS — N401 Enlarged prostate with lower urinary tract symptoms: Secondary | ICD-10-CM | POA: Diagnosis not present

## 2016-11-07 DIAGNOSIS — C679 Malignant neoplasm of bladder, unspecified: Secondary | ICD-10-CM | POA: Diagnosis not present

## 2016-11-07 DIAGNOSIS — Z952 Presence of prosthetic heart valve: Secondary | ICD-10-CM | POA: Diagnosis not present

## 2016-11-07 DIAGNOSIS — N184 Chronic kidney disease, stage 4 (severe): Secondary | ICD-10-CM | POA: Diagnosis not present

## 2016-11-07 DIAGNOSIS — E638 Other specified nutritional deficiencies: Secondary | ICD-10-CM | POA: Diagnosis not present

## 2016-11-07 DIAGNOSIS — R262 Difficulty in walking, not elsewhere classified: Secondary | ICD-10-CM | POA: Diagnosis not present

## 2016-11-07 DIAGNOSIS — G468 Other vascular syndromes of brain in cerebrovascular diseases: Secondary | ICD-10-CM | POA: Diagnosis not present

## 2016-11-07 DIAGNOSIS — F0390 Unspecified dementia without behavioral disturbance: Secondary | ICD-10-CM | POA: Diagnosis not present

## 2016-11-07 DIAGNOSIS — R296 Repeated falls: Secondary | ICD-10-CM | POA: Diagnosis not present

## 2016-11-07 DIAGNOSIS — E7800 Pure hypercholesterolemia, unspecified: Secondary | ICD-10-CM | POA: Diagnosis not present

## 2016-11-07 DIAGNOSIS — D509 Iron deficiency anemia, unspecified: Secondary | ICD-10-CM | POA: Diagnosis not present

## 2016-11-07 DIAGNOSIS — R42 Dizziness and giddiness: Secondary | ICD-10-CM | POA: Diagnosis not present

## 2016-11-07 DIAGNOSIS — M6281 Muscle weakness (generalized): Secondary | ICD-10-CM | POA: Diagnosis not present

## 2016-11-07 DIAGNOSIS — I10 Essential (primary) hypertension: Secondary | ICD-10-CM | POA: Diagnosis not present

## 2016-11-07 DIAGNOSIS — R2681 Unsteadiness on feet: Secondary | ICD-10-CM | POA: Diagnosis not present

## 2016-11-13 DIAGNOSIS — Z952 Presence of prosthetic heart valve: Secondary | ICD-10-CM | POA: Diagnosis not present

## 2016-11-13 DIAGNOSIS — R2681 Unsteadiness on feet: Secondary | ICD-10-CM | POA: Diagnosis not present

## 2016-11-13 DIAGNOSIS — G468 Other vascular syndromes of brain in cerebrovascular diseases: Secondary | ICD-10-CM | POA: Diagnosis not present

## 2016-11-13 DIAGNOSIS — R262 Difficulty in walking, not elsewhere classified: Secondary | ICD-10-CM | POA: Diagnosis not present

## 2016-11-13 DIAGNOSIS — R296 Repeated falls: Secondary | ICD-10-CM | POA: Diagnosis not present

## 2016-11-13 DIAGNOSIS — M6281 Muscle weakness (generalized): Secondary | ICD-10-CM | POA: Diagnosis not present

## 2016-11-14 DIAGNOSIS — R2681 Unsteadiness on feet: Secondary | ICD-10-CM | POA: Diagnosis not present

## 2016-11-14 DIAGNOSIS — Z952 Presence of prosthetic heart valve: Secondary | ICD-10-CM | POA: Diagnosis not present

## 2016-11-14 DIAGNOSIS — R296 Repeated falls: Secondary | ICD-10-CM | POA: Diagnosis not present

## 2016-11-14 DIAGNOSIS — M6281 Muscle weakness (generalized): Secondary | ICD-10-CM | POA: Diagnosis not present

## 2016-11-14 DIAGNOSIS — R262 Difficulty in walking, not elsewhere classified: Secondary | ICD-10-CM | POA: Diagnosis not present

## 2016-11-14 DIAGNOSIS — G468 Other vascular syndromes of brain in cerebrovascular diseases: Secondary | ICD-10-CM | POA: Diagnosis not present

## 2016-11-16 DIAGNOSIS — Z952 Presence of prosthetic heart valve: Secondary | ICD-10-CM | POA: Diagnosis not present

## 2016-11-16 DIAGNOSIS — R296 Repeated falls: Secondary | ICD-10-CM | POA: Diagnosis not present

## 2016-11-16 DIAGNOSIS — M6281 Muscle weakness (generalized): Secondary | ICD-10-CM | POA: Diagnosis not present

## 2016-11-16 DIAGNOSIS — G468 Other vascular syndromes of brain in cerebrovascular diseases: Secondary | ICD-10-CM | POA: Diagnosis not present

## 2016-11-16 DIAGNOSIS — R2681 Unsteadiness on feet: Secondary | ICD-10-CM | POA: Diagnosis not present

## 2016-11-16 DIAGNOSIS — R262 Difficulty in walking, not elsewhere classified: Secondary | ICD-10-CM | POA: Diagnosis not present

## 2016-11-19 DIAGNOSIS — G468 Other vascular syndromes of brain in cerebrovascular diseases: Secondary | ICD-10-CM | POA: Diagnosis not present

## 2016-11-19 DIAGNOSIS — M6281 Muscle weakness (generalized): Secondary | ICD-10-CM | POA: Diagnosis not present

## 2016-11-19 DIAGNOSIS — R2681 Unsteadiness on feet: Secondary | ICD-10-CM | POA: Diagnosis not present

## 2016-11-19 DIAGNOSIS — R262 Difficulty in walking, not elsewhere classified: Secondary | ICD-10-CM | POA: Diagnosis not present

## 2016-11-19 DIAGNOSIS — Z952 Presence of prosthetic heart valve: Secondary | ICD-10-CM | POA: Diagnosis not present

## 2016-11-19 DIAGNOSIS — R296 Repeated falls: Secondary | ICD-10-CM | POA: Diagnosis not present

## 2016-11-21 DIAGNOSIS — G468 Other vascular syndromes of brain in cerebrovascular diseases: Secondary | ICD-10-CM | POA: Diagnosis not present

## 2016-11-21 DIAGNOSIS — M6281 Muscle weakness (generalized): Secondary | ICD-10-CM | POA: Diagnosis not present

## 2016-11-21 DIAGNOSIS — Z952 Presence of prosthetic heart valve: Secondary | ICD-10-CM | POA: Diagnosis not present

## 2016-11-21 DIAGNOSIS — R296 Repeated falls: Secondary | ICD-10-CM | POA: Diagnosis not present

## 2016-11-21 DIAGNOSIS — R262 Difficulty in walking, not elsewhere classified: Secondary | ICD-10-CM | POA: Diagnosis not present

## 2016-11-21 DIAGNOSIS — R2681 Unsteadiness on feet: Secondary | ICD-10-CM | POA: Diagnosis not present

## 2016-11-29 DIAGNOSIS — R262 Difficulty in walking, not elsewhere classified: Secondary | ICD-10-CM | POA: Diagnosis not present

## 2016-11-29 DIAGNOSIS — R296 Repeated falls: Secondary | ICD-10-CM | POA: Diagnosis not present

## 2016-11-29 DIAGNOSIS — G468 Other vascular syndromes of brain in cerebrovascular diseases: Secondary | ICD-10-CM | POA: Diagnosis not present

## 2016-11-29 DIAGNOSIS — Z952 Presence of prosthetic heart valve: Secondary | ICD-10-CM | POA: Diagnosis not present

## 2016-11-29 DIAGNOSIS — M6281 Muscle weakness (generalized): Secondary | ICD-10-CM | POA: Diagnosis not present

## 2016-11-29 DIAGNOSIS — R2681 Unsteadiness on feet: Secondary | ICD-10-CM | POA: Diagnosis not present

## 2016-12-04 ENCOUNTER — Non-Acute Institutional Stay: Payer: Medicare Other | Admitting: Internal Medicine

## 2016-12-04 ENCOUNTER — Encounter: Payer: Self-pay | Admitting: Internal Medicine

## 2016-12-04 VITALS — BP 126/64 | HR 70 | Temp 97.7°F | Ht 67.0 in | Wt 155.0 lb

## 2016-12-04 DIAGNOSIS — Z952 Presence of prosthetic heart valve: Secondary | ICD-10-CM | POA: Diagnosis not present

## 2016-12-04 DIAGNOSIS — G301 Alzheimer's disease with late onset: Secondary | ICD-10-CM | POA: Diagnosis not present

## 2016-12-04 DIAGNOSIS — N182 Chronic kidney disease, stage 2 (mild): Secondary | ICD-10-CM | POA: Diagnosis not present

## 2016-12-04 DIAGNOSIS — M6281 Muscle weakness (generalized): Secondary | ICD-10-CM | POA: Diagnosis not present

## 2016-12-04 DIAGNOSIS — R262 Difficulty in walking, not elsewhere classified: Secondary | ICD-10-CM | POA: Diagnosis not present

## 2016-12-04 DIAGNOSIS — G468 Other vascular syndromes of brain in cerebrovascular diseases: Secondary | ICD-10-CM | POA: Diagnosis not present

## 2016-12-04 DIAGNOSIS — R296 Repeated falls: Secondary | ICD-10-CM | POA: Diagnosis not present

## 2016-12-04 DIAGNOSIS — F05 Delirium due to known physiological condition: Secondary | ICD-10-CM | POA: Diagnosis not present

## 2016-12-04 DIAGNOSIS — F0281 Dementia in other diseases classified elsewhere with behavioral disturbance: Secondary | ICD-10-CM

## 2016-12-04 DIAGNOSIS — D500 Iron deficiency anemia secondary to blood loss (chronic): Secondary | ICD-10-CM | POA: Diagnosis not present

## 2016-12-04 DIAGNOSIS — R2681 Unsteadiness on feet: Secondary | ICD-10-CM | POA: Diagnosis not present

## 2016-12-04 DIAGNOSIS — D509 Iron deficiency anemia, unspecified: Secondary | ICD-10-CM | POA: Diagnosis not present

## 2016-12-04 DIAGNOSIS — N401 Enlarged prostate with lower urinary tract symptoms: Secondary | ICD-10-CM | POA: Diagnosis not present

## 2016-12-04 DIAGNOSIS — F22 Delusional disorders: Secondary | ICD-10-CM

## 2016-12-04 DIAGNOSIS — E638 Other specified nutritional deficiencies: Secondary | ICD-10-CM | POA: Diagnosis not present

## 2016-12-04 DIAGNOSIS — I10 Essential (primary) hypertension: Secondary | ICD-10-CM | POA: Diagnosis not present

## 2016-12-04 DIAGNOSIS — N184 Chronic kidney disease, stage 4 (severe): Secondary | ICD-10-CM | POA: Diagnosis not present

## 2016-12-04 DIAGNOSIS — R42 Dizziness and giddiness: Secondary | ICD-10-CM | POA: Diagnosis not present

## 2016-12-04 DIAGNOSIS — E7800 Pure hypercholesterolemia, unspecified: Secondary | ICD-10-CM | POA: Diagnosis not present

## 2016-12-04 DIAGNOSIS — E785 Hyperlipidemia, unspecified: Secondary | ICD-10-CM | POA: Diagnosis not present

## 2016-12-04 DIAGNOSIS — C679 Malignant neoplasm of bladder, unspecified: Secondary | ICD-10-CM | POA: Diagnosis not present

## 2016-12-04 DIAGNOSIS — F0390 Unspecified dementia without behavioral disturbance: Secondary | ICD-10-CM | POA: Diagnosis not present

## 2016-12-04 DIAGNOSIS — K922 Gastrointestinal hemorrhage, unspecified: Secondary | ICD-10-CM

## 2016-12-04 DIAGNOSIS — R269 Unspecified abnormalities of gait and mobility: Secondary | ICD-10-CM | POA: Diagnosis not present

## 2016-12-04 DIAGNOSIS — F02818 Dementia in other diseases classified elsewhere, unspecified severity, with other behavioral disturbance: Secondary | ICD-10-CM

## 2016-12-04 MED ORDER — RISPERIDONE 0.5 MG PO TABS: ORAL_TABLET | ORAL | 5 refills | Status: DC

## 2016-12-04 NOTE — Progress Notes (Signed)
Facility  [facility]    Place of Service: [facility] (12)     Allergies  Allergen Reactions  . Advil [Ibuprofen] Other (See Comments)    Causes stomach bleeding, need transfusions as result  . Aspirin Other (See Comments)    Causes stomach bleeding, needs transfusion as result  . Penicillins Itching and Swelling    Has patient had a PCN reaction causing immediate rash, facial/tongue/throat swelling, SOB or lightheadedness with hypotension: Unsure Has patient had a PCN reaction causing severe rash involving mucus membranes or skin necrosis: Unsure  Has patient had a PCN reaction that required hospitalization Yes Has patient had a PCN reaction occurring within the last 10 years: No If all of the above answers are "NO", then may proceed with Cephalosporin use.  . Atorvastatin     Joint ache  . Namenda [Memantine]     Stomach pain  . Other Other (See Comments)    No blood thinners due to GI bleed.  . Ativan [Lorazepam] Anxiety    He became very anxious and aggitated    Chief Complaint  Patient presents with  . Medical Management of Chronic Issues    3 month medication management of blood pressure, anemia, cholesterol, review lab. Here with wife.    HPI:   Late onset Alzheimer's disease with behavioral disturbance - has had CVA in the past so he has cerebrovascular disease, but there is a gradual worsening of memory that may be more consistent with Alzheimer's Disease. There are behavioral challenges. He is a "busy" man that attempts to get out of his wheelchair to walk, but his balance is so bad that he falls. He is impulsive and aguementative when he is stopped when he acts dangerously. Sertraline has not helped. He does not show tearfulness or depression.  Iron deficiency anemia due to chronic blood loss - improving with last hgb 12.1  Hypertension, unspecified type - controlled  Hyperlipidemia, unspecified hyperlipidemia type - controlled  Gastrointestinal hemorrhage,  unspecified gastrointestinal hemorrhage type - related to Plavix and ASA in the past  SunDown syndrome - agitation increases in the late afternoon. 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 Imagines his parents or other dead relatives call him..  Delusional disorder ([place]) - see above  Abnormality of gait - related to poor balance. Rides in wheelchair.    Medications: Patient's Medications  New Prescriptions   No medications on file  Previous Medications   ACETAMINOPHEN (TYLENOL) 500 MG TABLET    Take 500 mg by mouth every 6 (six) hours as needed for mild pain, moderate pain or headache.   ALPRAZOLAM (XANAX) 0.5 MG TABLET    Take 0.5 tablets (0.25 mg total) by mouth 3 (three) times daily as needed.   AMLODIPINE (NORVASC) 5 MG TABLET    Take 1 tablet (5 mg total) by mouth daily.   CHOLECALCIFEROL (VITAMIN D) 1000 UNITS TABLET    Take 1,000 Units by mouth daily.   CITALOPRAM (CELEXA) 20 MG TABLET    Take 1 tablet (20 mg total) by mouth daily.   IRON POLYSACCHARIDES (NIFEREX) 150 MG CAPSULE    Take 150 mg by mouth every morning.    LATANOPROST (XALATAN) 0.005 % OPHTHALMIC SOLUTION    Place 1 drop into both eyes at bedtime.   MULTIPLE VITAMIN (MULTIVITAMIN) TABLET    Take 1 tablet by mouth daily.   SERTRALINE (ZOLOFT) 50 MG TABLET    Take  one tablet daily   VITAMIN B-12 (CYANOCOBALAMIN) 1000 MCG TABLET    Take 1,000 mcg by mouth daily.  Modified Medications   No medications on file  Discontinued Medications   No medications on file     Review of Systems  Constitutional: Positive for fatigue. Negative for activity change, appetite change, fever and unexpected weight change.       Weak. Frail  HENT: Positive for hearing loss. Negative for congestion, ear pain, rhinorrhea, sore throat, tinnitus, trouble swallowing and voice change.        Dentures  Eyes:       Glaucoma  Respiratory: Negative for cough, choking, chest tightness, shortness  of breath and wheezing.   Cardiovascular: Negative for chest pain, palpitations and leg swelling.       Hx aortic valve replacement  Gastrointestinal: Negative for abdominal distention, abdominal pain, constipation, diarrhea and nausea.       Hx UGI bleed from AVM. Hx colon polyps.  Endocrine: Negative for cold intolerance, heat intolerance, polydipsia, polyphagia and polyuria.  Genitourinary: Negative for dysuria, frequency, testicular pain and urgency.       Hx bladder cancer. Cystoscopy about q57mo  Musculoskeletal: Positive for gait problem (poor balance). Negative for arthralgias, back pain, myalgias and neck pain.  Skin: Negative for color change, pallor and rash.  Allergic/Immunologic: Negative.   Neurological: Positive for tremors (mild ansd bilateral). Negative for dizziness, syncope, speech difficulty, weakness, numbness and headaches.       Dementia  Hematological: Negative for adenopathy. Does not bruise/bleed easily.  Psychiatric/Behavioral: Positive for agitation (eoisodic), behavioral problems, confusion, decreased concentration and hallucinations. Negative for sleep disturbance. The patient is nervous/anxious.     Vitals:   12/04/16 1113  BP: 126/64  Pulse: 70  Temp: 97.7 F (36.5 C)  TempSrc: Oral  SpO2: 97%  Weight: 155 lb (70.3 kg)  Height: '5\' 7"'$  (1.702 m)   Wt Readings from Last 3 Encounters:  12/04/16 155 lb (70.3 kg)  09/18/16 155 lb 6.4 oz (70.5 kg)  06/07/16 157 lb 12 oz (71.6 kg)    Body mass index is 24.28 kg/m.  Physical Exam  Constitutional: He appears well-developed and well-nourished. No distress.  Elderly and frail;  HENT:  Right Ear: External ear normal.  Left Ear: External ear normal.  Nose: Nose normal.  Mouth/Throat: Oropharynx is clear and moist. No oropharyngeal exudate.  Bilateral loss of hearing and aid in the left ear.  Eyes: Conjunctivae and EOM are normal. Pupils are equal, round, and reactive to light.  Small pupils  Neck: No  JVD present. No tracheal deviation present. No thyromegaly present.  Cardiovascular: Normal rate, regular rhythm, normal heart sounds and intact distal pulses.  Exam reveals no gallop and no friction rub.   No murmur heard. Pulmonary/Chest: No respiratory distress. He has no wheezes. He has no rales. He exhibits no tenderness.  Abdominal: He exhibits no distension and no mass. There is no tenderness.  Musculoskeletal: Normal range of motion. He exhibits no edema or tenderness.  Lymphadenopathy:    He has no cervical adenopathy.  Neurological: He is alert. He has normal reflexes. No cranial nerve deficit. Coordination abnormal.  02/19/16 MMSE 10/30. Failed clock drawing.  Skin: No rash noted. No erythema. No pallor.  Psychiatric: He has a normal mood and affect. His behavior is normal. Thought content normal.     Labs reviewed: Lab Summary Latest Ref Rng & Units 09/24/2016 11/08/2015 02/23/2015 02/23/2015  Hemoglobin 13.2 - 17.1 g/dL 12.1(L) 11.6(L)  11.6(L) 10.9(L)  Hematocrit 38.5 - 50.0 % 36.5(L) 35.1(L) 34.0(L) 32.8(L)  White count 3.8 - 10.8 K/uL 5.3 4.2 (None) 4.5  Platelet count 140 - 400 K/uL 213 169 (None) 138(L)  Sodium 135 - 146 mmol/L 140 143 141 140  Potassium 3.5 - 5.3 mmol/L 4.4 4.7 4.2 4.3  Calcium 8.6 - 10.3 mg/dL 9.2 9.3 (None) 9.0  Phosphorus - (None) (None) (None) (None)  Creatinine 0.70 - 1.11 mg/dL 1.65(H) 1.65(H) 1.40(H) 1.46(H)  AST 10 - 35 U/L 17 (None) (None) 17  Alk Phos 40 - 115 U/L 67 (None) (None) 64  Bilirubin 0.2 - 1.2 mg/dL 0.4 (None) (None) 0.4  Glucose 65 - 99 mg/dL 95 91 104(H) 105(H)  Cholesterol <200 mg/dL 167 (None) (None) (None)  HDL cholesterol >40 mg/dL 46 (None) (None) (None)  Triglycerides <150 mg/dL 114 (None) (None) (None)  LDL Direct - (None) (None) (None) (None)  LDL Calc <100 mg/dL 98 (None) (None) (None)  Total protein 6.1 - 8.1 g/dL 7.4 (None) (None) 7.6  Albumin 3.6 - 5.1 g/dL 3.9 (None) (None) 3.4(L)  Some recent data might be hidden     Lab Results  Component Value Date   TSH 2.240 11/17/2013   Lab Results  Component Value Date   BUN 22 09/24/2016   BUN 27 (H) 11/08/2015   BUN 23 (H) 02/23/2015   Lab Results  Component Value Date   CREATININE 1.65 (H) 09/24/2016   CREATININE 1.65 (H) 11/08/2015   CREATININE 1.40 (H) 02/23/2015   Lab Results  Component Value Date   HGBA1C 5.2 08/19/2013       Assessment/Plan  1. Late onset Alzheimer's disease with behavioral disturbance Intolerant to memantine  2. Iron deficiency anemia due to chronic blood loss - CBC with Differential/Platelet; Future  3. Hypertension, unspecified type - Comprehensive metabolic panel; Future  4. Hyperlipidemia, unspecified hyperlipidemia type - Lipid panel; Future  5. Gastrointestinal hemorrhage, unspecified gastrointestinal hemorrhage type Avoid NSAID, ASA, Plavix  6. SunDown syndrome - risperiDONE (RISPERDAL) 0.5 MG tablet; Take one tablet twice daily to help reduce delusions and agitation  Dispense: 60 tablet; Refill: 5  7. Delusional disorder ([place]) - risperiDONE (RISPERDAL) 0.5 MG tablet; Take one tablet twice daily to help reduce delusions and agitation  Dispense: 60 tablet; Refill: 5  8. Abnormality of gait Confined to bed and wheelchair  9. CKD (chronic kidney disease) stage 2, GFR 60-89 ml/min -CMP, future

## 2016-12-06 ENCOUNTER — Emergency Department (HOSPITAL_COMMUNITY): Payer: Medicare Other

## 2016-12-06 ENCOUNTER — Encounter (HOSPITAL_COMMUNITY): Payer: Self-pay

## 2016-12-06 ENCOUNTER — Ambulatory Visit: Payer: Medicare Other | Admitting: Neurology

## 2016-12-06 ENCOUNTER — Emergency Department (HOSPITAL_COMMUNITY)
Admission: EM | Admit: 2016-12-06 | Discharge: 2016-12-06 | Disposition: A | Discharge status: Home / Self Care | Payer: Medicare Other | Attending: Emergency Medicine | Admitting: Emergency Medicine

## 2016-12-06 ENCOUNTER — Telehealth: Payer: Self-pay

## 2016-12-06 ENCOUNTER — Other Ambulatory Visit: Payer: Self-pay

## 2016-12-06 DIAGNOSIS — Z8673 Personal history of transient ischemic attack (TIA), and cerebral infarction without residual deficits: Secondary | ICD-10-CM | POA: Diagnosis not present

## 2016-12-06 DIAGNOSIS — R404 Transient alteration of awareness: Secondary | ICD-10-CM | POA: Diagnosis not present

## 2016-12-06 DIAGNOSIS — R410 Disorientation, unspecified: Secondary | ICD-10-CM | POA: Diagnosis not present

## 2016-12-06 DIAGNOSIS — I129 Hypertensive chronic kidney disease with stage 1 through stage 4 chronic kidney disease, or unspecified chronic kidney disease: Secondary | ICD-10-CM | POA: Insufficient documentation

## 2016-12-06 DIAGNOSIS — R6889 Other general symptoms and signs: Secondary | ICD-10-CM | POA: Diagnosis not present

## 2016-12-06 DIAGNOSIS — S0990XA Unspecified injury of head, initial encounter: Secondary | ICD-10-CM | POA: Diagnosis not present

## 2016-12-06 DIAGNOSIS — F22 Delusional disorders: Secondary | ICD-10-CM

## 2016-12-06 DIAGNOSIS — N182 Chronic kidney disease, stage 2 (mild): Secondary | ICD-10-CM | POA: Insufficient documentation

## 2016-12-06 DIAGNOSIS — R4182 Altered mental status, unspecified: Secondary | ICD-10-CM | POA: Diagnosis not present

## 2016-12-06 DIAGNOSIS — Z79899 Other long term (current) drug therapy: Secondary | ICD-10-CM | POA: Diagnosis not present

## 2016-12-06 DIAGNOSIS — S199XXA Unspecified injury of neck, initial encounter: Secondary | ICD-10-CM | POA: Diagnosis not present

## 2016-12-06 DIAGNOSIS — Z8551 Personal history of malignant neoplasm of bladder: Secondary | ICD-10-CM | POA: Insufficient documentation

## 2016-12-06 DIAGNOSIS — R443 Hallucinations, unspecified: Secondary | ICD-10-CM | POA: Diagnosis not present

## 2016-12-06 DIAGNOSIS — R791 Abnormal coagulation profile: Secondary | ICD-10-CM | POA: Diagnosis not present

## 2016-12-06 DIAGNOSIS — G309 Alzheimer's disease, unspecified: Secondary | ICD-10-CM | POA: Diagnosis not present

## 2016-12-06 LAB — DIFFERENTIAL
Basophils Absolute: 0 10*3/uL (ref 0.0–0.1)
Basophils Relative: 0 %
Eosinophils Absolute: 0 10*3/uL (ref 0.0–0.7)
Eosinophils Relative: 0 %
Lymphocytes Relative: 5 %
Lymphs Abs: 0.5 10*3/uL — ABNORMAL LOW (ref 0.7–4.0)
Monocytes Absolute: 0.6 10*3/uL (ref 0.1–1.0)
Monocytes Relative: 6 %
Neutro Abs: 8.6 10*3/uL — ABNORMAL HIGH (ref 1.7–7.7)
Neutrophils Relative %: 89 %

## 2016-12-06 LAB — URINALYSIS, ROUTINE W REFLEX MICROSCOPIC
Bacteria, UA: NONE SEEN
Bilirubin Urine: NEGATIVE
Glucose, UA: NEGATIVE mg/dL
Ketones, ur: NEGATIVE mg/dL
Leukocytes, UA: NEGATIVE
Nitrite: NEGATIVE
Protein, ur: NEGATIVE mg/dL
Specific Gravity, Urine: 1.015 (ref 1.005–1.030)
pH: 5 (ref 5.0–8.0)

## 2016-12-06 LAB — COMPREHENSIVE METABOLIC PANEL
ALT: 9 U/L — ABNORMAL LOW (ref 17–63)
AST: 26 U/L (ref 15–41)
Albumin: 3.4 g/dL — ABNORMAL LOW (ref 3.5–5.0)
Alkaline Phosphatase: 61 U/L (ref 38–126)
Anion gap: 9 (ref 5–15)
BUN: 22 mg/dL — ABNORMAL HIGH (ref 6–20)
CO2: 22 mmol/L (ref 22–32)
Calcium: 8.9 mg/dL (ref 8.9–10.3)
Chloride: 110 mmol/L (ref 101–111)
Creatinine, Ser: 1.57 mg/dL — ABNORMAL HIGH (ref 0.61–1.24)
GFR calc Af Amer: 42 mL/min — ABNORMAL LOW (ref >60)
GFR calc non Af Amer: 36 mL/min — ABNORMAL LOW (ref >60)
Glucose, Bld: 105 mg/dL — ABNORMAL HIGH (ref 65–99)
Potassium: 4.7 mmol/L (ref 3.5–5.1)
Sodium: 141 mmol/L (ref 135–145)
Total Bilirubin: 1.2 mg/dL (ref 0.3–1.2)
Total Protein: 7.3 g/dL (ref 6.5–8.1)

## 2016-12-06 LAB — PROTIME-INR
INR: 1.14
Prothrombin Time: 14.6 seconds (ref 11.4–15.2)

## 2016-12-06 LAB — CBC
HCT: 33.6 % — ABNORMAL LOW (ref 39.0–52.0)
Hemoglobin: 11.1 g/dL — ABNORMAL LOW (ref 13.0–17.0)
MCH: 31.4 pg (ref 26.0–34.0)
MCHC: 33 g/dL (ref 30.0–36.0)
MCV: 94.9 fL (ref 78.0–100.0)
Platelets: 144 10*3/uL — ABNORMAL LOW (ref 150–400)
RBC: 3.54 MIL/uL — ABNORMAL LOW (ref 4.22–5.81)
RDW: 14.6 % (ref 11.5–15.5)
WBC: 9.7 10*3/uL (ref 4.0–10.5)

## 2016-12-06 LAB — I-STAT TROPONIN, ED: Troponin i, poc: 0 ng/mL (ref 0.00–0.08)

## 2016-12-06 LAB — I-STAT CHEM 8, ED
BUN: 30 mg/dL — ABNORMAL HIGH (ref 6–20)
Calcium, Ion: 1.08 mmol/L — ABNORMAL LOW (ref 1.15–1.40)
Chloride: 110 mmol/L (ref 101–111)
Creatinine, Ser: 1.6 mg/dL — ABNORMAL HIGH (ref 0.61–1.24)
Glucose, Bld: 101 mg/dL — ABNORMAL HIGH (ref 65–99)
HCT: 32 % — ABNORMAL LOW (ref 39.0–52.0)
Hemoglobin: 10.9 g/dL — ABNORMAL LOW (ref 13.0–17.0)
Potassium: 4.9 mmol/L (ref 3.5–5.1)
Sodium: 141 mmol/L (ref 135–145)
TCO2: 26 mmol/L (ref 0–100)

## 2016-12-06 LAB — APTT: aPTT: 34 seconds (ref 24–36)

## 2016-12-06 MED ORDER — RISPERIDONE 0.25 MG PO TABS: 0.2500 mg | ORAL_TABLET | Freq: Two times a day (BID) | ORAL | 1 refills | Status: DC

## 2016-12-06 NOTE — ED Provider Notes (Signed)
[place] DEPT Provider Note   CSN: [id number] Arrival date & time: 12/06/16  1002  By signing my name below, I, Bryan Moore, attest that this documentation has been prepared under the direction and in the presence of Bryan Manifold, MD. Electronically Signed: Sonum Moore, Education administrator. 12/06/16. 11:02 AM.  History   Chief Complaint Chief Complaint  Patient presents with  . Altered Mental Status    The history is provided by the patient. No language interpreter was used.    LEVEL 5 CAVEAT: AMS HPI Comments: Bryan Moore is a 83 y.o. male brought in by ambulance, who presents to the Emergency Department complaining of increased confusion after a fall this morning. Per EMS report, patient was last known well before midnight last night. He lives with his wife at [facility]. Per nursing note, patient has dementia at baseline but was more altered this morning.     Past Medical History:  Diagnosis Date  . Abnormality of gait 06/07/2016  . Anemia    from AVM's. Requiring transfusion  . Aortic stenosis    s/p TAVI July 2012  . Arthritis   . Balance problem    WEAK ON RIGHT SIDE ( SCOLISOSIS AND HX OF A BACK SURGERY) - BALANCE PROBLEM - "FALLS BACKWARD" - FREQUENT FALLS.  USES CANE WHEN AMBULATING  . Cancer (HCC)    BLADDER CANCER  . Chronic kidney disease   . Complication of anesthesia    VERY DROWSY FOR HOURS AFTER SURGERY - AND DISORIENTATION  . Dementia    more memory problems from stroke  . Fall   . GERD (gastroesophageal reflux disease)    RARE - AND NO MEDS  . GI bleed   . Glaucoma   . Glaucoma   . Heart murmur    PT'S CARDIOLOGIST IS DR. Acie Fredrickson -LAST OFFICE VISIT 03/02/13 IN EPIC  . History of blood transfusion LAST 2-3 YRS AGO   HAD 4 OR 5   . History of cerebral infarction 08/19/2013  . Hypercholesteremia   . Hyperlipemia   . Hypertension    Not taking Lisinopril due to BP drops low.  . Mitral regurgitation   . PONV (postoperative nausea and vomiting)    IN PAST, DID WELL WITH LAST SURGERY  . Shingles May 2013   right arm residual pain- last outbreak 1 1/2 yrs ago.  . Stroke ([place])    left internal capsule 1/15  . Transfusion history    last 2 yrs ago.    Patient Active Problem List   Diagnosis Date Noted  . CKD (chronic kidney disease) stage 2, GFR 60-89 ml/min 12/04/2016  . Abnormality of gait 01/20/202017  . Delusional disorder ([place]) 12/19/2015  . Hypervascular lesion of urinary bladder 11/16/2015  . Hx of bladder cancer 11/15/2015  . Alzheimer's disease 02/24/2014  . Greater trochanteric bursitis of right hip 02/08/2014  . Lacunar infarct, acute ([place]) 11/12/2013  . Gastric AVM 09/18/2013  . History of cerebral infarction 08/19/2013  . Bladder cancer ([place]) 04/15/2013  . SunDown syndrome 04/15/2013  . Frequent falls 07/17/2012  . Atypical chest pain 12/10/2011  . GI bleeding 08/16/2011  . Aortic stenosis 08/15/2011  . Aortic valve disease 03/28/2011  . Hypertension 03/28/2011  . Hyperlipidemia 03/28/2011  . Anemia 03/28/2011    Past Surgical History:  Procedure Laterality Date  . AORTIC VALVE REPLACEMENT     02/2011([facility])  . APPENDECTOMY    . BACK SURGERY  2010 OR 2011   LOWER  . CARDIAC  CATHETERIZATION  10/23/2010   ef 65%  . CARDIOVASCULAR STRESS TEST  01/01/2007   EF 64%, NO EVIDENCE OF ISCHEMIA  . CYSTOSCOPY W/ RETROGRADES Bilateral 09/17/2013   Procedure: CYSTOSCOPY WITH BILATERAL RETROGRADE;  Surgeon: [name], MD;  Location: [facility];  Service: Urology;  Laterality: Bilateral;  . CYSTOSCOPY W/ RETROGRADES Bilateral 11/15/2015   Procedure: CYSTO, BILATERAL RETROGRADE PYELOGRAM WITH BLADDER BIOPSY;  Surgeon: [name], MD;  Location: [facility];  Service: Urology;  Laterality: Bilateral;  . CYSTOSCOPY WITH STENT PLACEMENT Left 04/13/2013   Procedure:  LEFT URETERAL STENT PLACEMENT;  Surgeon: [name], MD;  Location: [facility];  Service: Urology;  Laterality: Left;  . EYE SURGERY     bilateral cataracts with lens  implants  . TRANSURETHRAL RESECTION OF BLADDER TUMOR N/A 06/04/2013   Procedure: RESTAGING TRANSURETHRAL RESECTION OF BLADDER TUMOR (TURBT), URETERAL CATHERIZATION ;  Surgeon: [name], MD;  Location: [facility];  Service: Urology;  Laterality: N/A;  CYSTO RESTAGING TURBT     . TRANSURETHRAL RESECTION OF BLADDER TUMOR N/A 09/17/2013   Procedure: TRANSURETHRAL RESECTION OF BLADDER TUMOR WITH MITOMYCIN-C;  Surgeon: [name], MD;  Location: [facility];  Service: Urology;  Laterality: N/A;  . TRANSURETHRAL RESECTION OF BLADDER TUMOR WITH GYRUS (TURBT-GYRUS) N/A 04/13/2013   Procedure: TRANSURETHRAL RESECTION OF BLADDER TUMOR WITH GYRUS (TURBT-GYRUS);  Surgeon: [name], MD;  Location: [facility];  Service: Urology;  Laterality: N/A;  . US ECHOCARDIOGRAPHY  08/31/10   EF 55-60%       Home Medications    Prior to Admission medications   Medication Sig Start Date End Date Taking? Authorizing Provider  acetaminophen (TYLENOL) 500 MG tablet Take 500 mg by mouth every 6 (six) hours as needed for mild pain, moderate pain or headache.   Yes Historical Provider, MD  ALPRAZolam Duanne Moron) 0.5 MG tablet Take 0.5 tablets (0.25 mg total) by mouth 3 (three) times daily as needed. 09/18/16  Yes Estill Dooms, MD  amLODipine (NORVASC) 5 MG tablet Take 1 tablet (5 mg total) by mouth daily. 11/05/13  Yes Ivan Anchors Love, PA-C  cholecalciferol (VITAMIN D) 1000 units tablet Take 1,000 Units by mouth daily.   Yes Historical Provider, MD  citalopram (CELEXA) 20 MG tablet Take 20 mg by mouth daily.   Yes Historical Provider, MD  iron polysaccharides (NIFEREX) 150 MG capsule Take 150 mg by mouth every morning.    Yes Historical Provider, MD  latanoprost (XALATAN) 0.005 % ophthalmic solution Place 1 drop into both eyes at bedtime. 10/11/15  Yes Historical Provider, MD  Multiple Vitamin (MULTIVITAMIN) tablet Take 1 tablet by mouth daily.   Yes Historical Provider, MD  vitamin B-12 (CYANOCOBALAMIN) 1000 MCG tablet Take 1,000 mcg by  mouth daily.   Yes Historical Provider, MD  risperiDONE (RISPERDAL) 0.5 MG tablet Take one tablet twice daily to help reduce delusions and agitation Patient not taking: Reported on 12/06/2016 12/04/16   Estill Dooms, MD    Family History Family History  Problem Relation Age of Onset  . COPD Father   . Stroke Father   . Parkinson's disease Brother 45  . Heart failure Mother   . Bowel Disease Sister 28    Social History Social History  Substance Use Topics  . Smoking status: Former Smoker    Types: Pipe    Quit date: 07/04/1981  . Smokeless tobacco: Never Used  . Alcohol use 0.6 oz/week    1 Glasses of wine per week  Allergies   Advil [ibuprofen]; Aspirin; Penicillins; Aricept [donepezil]; Atorvastatin; Famciclovir; Namenda [memantine]; Other; and Ativan [lorazepam]   Review of Systems Review of Systems  Unable to perform ROS: Mental status change    Physical Exam Updated Vital Signs BP (!) 126/56   Pulse 77   Temp 97.6 F (36.4 C) (Axillary)   Resp 20   SpO2 97%   Physical Exam  Constitutional: He appears well-developed and well-nourished.  HENT:  Head: Normocephalic.  Eyes: Pupils are unequal.  Left pupil 3 mm, right is 2 mm; otherwise reactive.   Neck: Neck supple. No tracheal deviation present.  Patient reports pain with palpation of the cervical spine.  Cardiovascular: Normal rate, regular rhythm, normal heart sounds and intact distal pulses.   Pulmonary/Chest: Effort normal and breath sounds normal. No respiratory distress.  Abdominal: Soft. He exhibits no distension. There is no tenderness.  Musculoskeletal:  Denies pain with palpation of the T or L spine.   Neurological:  Not following commands. When raising BUE patient can hold against gravity. Tone is normal in lower extremities and withdraws to pain. Answers some questions with yes/no answers.   Skin: Skin is warm and dry.  Psychiatric: He has a normal mood and affect. Judgment normal.  Nursing  note and vitals reviewed.    ED Treatments / Results  DIAGNOSTIC STUDIES: Oxygen Saturation is 97% on RA, nml by my interpretation.     Labs (all labs ordered are listed, but only abnormal results are displayed) Labs Reviewed  CBC - Abnormal; Notable for the following:       Result Value   RBC 3.54 (*)    Hemoglobin 11.1 (*)    HCT 33.6 (*)    Platelets 144 (*)    All other components within normal limits  DIFFERENTIAL - Abnormal; Notable for the following:    Neutro Abs 8.6 (*)    Lymphs Abs 0.5 (*)    All other components within normal limits  COMPREHENSIVE METABOLIC PANEL - Abnormal; Notable for the following:    Glucose, Bld 105 (*)    BUN 22 (*)    Creatinine, Ser 1.57 (*)    Albumin 3.4 (*)    ALT 9 (*)    GFR calc non Af Amer 36 (*)    GFR calc Af Amer 42 (*)    All other components within normal limits  URINALYSIS, ROUTINE W REFLEX MICROSCOPIC - Abnormal; Notable for the following:    Hgb urine dipstick MODERATE (*)    Squamous Epithelial / LPF 0-5 (*)    All other components within normal limits  I-STAT CHEM 8, ED - Abnormal; Notable for the following:    BUN 30 (*)    Creatinine, Ser 1.60 (*)    Glucose, Bld 101 (*)    Calcium, Ion 1.08 (*)    Hemoglobin 10.9 (*)    HCT 32.0 (*)    All other components within normal limits  PROTIME-INR  APTT  I-STAT TROPOININ, ED    EKG  EKG Interpretation  Date/Time:  Thursday Dec 06 2016 10:21:25 EDT Ventricular Rate:  80 PR Interval:    QRS Duration: 138 QT Interval:  423 QTC Calculation: 488 R Axis:   80 Text Interpretation:  Sinus rhythm Ventricular premature complex Left bundle branch block No significant change since last tracing Confirmed by Sherwood Gambler [phone]) on 12/09/2016 6:39:34 PM       Radiology No results found.   Ct Head Wo Contrast  Result Date: 12/06/2016  CLINICAL DATA:  83 year old male with increased confusion and fall today. Unequal pupils. EXAM: CT HEAD WITHOUT CONTRAST CT CERVICAL  SPINE WITHOUT CONTRAST TECHNIQUE: Multidetector CT imaging of the head and cervical spine was performed following the standard protocol without intravenous contrast. Multiplanar CT image reconstructions of the cervical spine were also generated. COMPARISON:  Head and cervical spine CT 06/01/2016 and earlier. FINDINGS: CT HEAD FINDINGS Brain: Stable cerebral volume. No midline shift, ventriculomegaly, mass effect, evidence of mass lesion, intracranial hemorrhage or evidence of cortically based acute infarction. Stable gray-white matter differentiation throughout the brain. Chronic lacunar infarcts in the left deep gray matter nuclei. Vascular: Calcified atherosclerosis at the skull base. Skull: Intact.  No acute osseous abnormality identified. Sinuses/Orbits: Visualized paranasal sinuses and mastoids are stable and well pneumatized. Other: Mild right forehead scalp soft tissue scarring suspected from prior 2016 hematoma there. No acute scalp hematoma identified. No acute orbit soft tissue finding. CT CERVICAL SPINE FINDINGS Alignment: Stable. Chronic anterolisthesis patch that chronic anterolisthesis of C3 on C4 and C7 on T1. Bilateral posterior element alignment is within normal limits. Skull base and vertebrae: Stable bone mineralization. Visualized skull base is intact. No atlanto-occipital dissociation. No cervical spine fracture identified. Soft tissues and spinal canal: No prevertebral fluid or swelling. No visible canal hematoma. Stable and negative noncontrast neck soft tissues. Disc levels: Advanced lower cervical disc and endplate degeneration. Advanced chronic facet degeneration on the right at C3-C4 and bilaterally at C7-T1. Multilevel chronic degenerative cervical spinal stenosis. Upper chest: Visible upper thoracic levels appear grossly stable and intact. Partially visible Calcified aortic atherosclerosis. And probable aneurysmal proximal descending thoracic aorta with aberrant right subclavian artery  origin. Negative lung apices. IMPRESSION: 1. No acute intracranial abnormality. Stable non contrast CT appearance of the brain. 2. No acute fracture or listhesis identified in the cervical spine. Advanced chronic cervical spine degeneration and chronic degenerative spinal stenosis. 3. Calcified aortic atherosclerosis with probable chronic aneurysmal enlargement of the distal arch and proximal descending thoracic aorta. Electronically Signed   By: Genevie Ann M.D.   On: 12/06/2016 11:33   Ct Cervical Spine Wo Contrast  Result Date: 12/06/2016 CLINICAL DATA:  83 year old male with increased confusion and fall today. Unequal pupils. EXAM: CT HEAD WITHOUT CONTRAST CT CERVICAL SPINE WITHOUT CONTRAST TECHNIQUE: Multidetector CT imaging of the head and cervical spine was performed following the standard protocol without intravenous contrast. Multiplanar CT image reconstructions of the cervical spine were also generated. COMPARISON:  Head and cervical spine CT 06/01/2016 and earlier. FINDINGS: CT HEAD FINDINGS Brain: Stable cerebral volume. No midline shift, ventriculomegaly, mass effect, evidence of mass lesion, intracranial hemorrhage or evidence of cortically based acute infarction. Stable gray-white matter differentiation throughout the brain. Chronic lacunar infarcts in the left deep gray matter nuclei. Vascular: Calcified atherosclerosis at the skull base. Skull: Intact.  No acute osseous abnormality identified. Sinuses/Orbits: Visualized paranasal sinuses and mastoids are stable and well pneumatized. Other: Mild right forehead scalp soft tissue scarring suspected from prior 2016 hematoma there. No acute scalp hematoma identified. No acute orbit soft tissue finding. CT CERVICAL SPINE FINDINGS Alignment: Stable. Chronic anterolisthesis patch that chronic anterolisthesis of C3 on C4 and C7 on T1. Bilateral posterior element alignment is within normal limits. Skull base and vertebrae: Stable bone mineralization.  Visualized skull base is intact. No atlanto-occipital dissociation. No cervical spine fracture identified. Soft tissues and spinal canal: No prevertebral fluid or swelling. No visible canal hematoma. Stable and negative noncontrast neck soft tissues. Disc levels: Advanced lower cervical  disc and endplate degeneration. Advanced chronic facet degeneration on the right at C3-C4 and bilaterally at C7-T1. Multilevel chronic degenerative cervical spinal stenosis. Upper chest: Visible upper thoracic levels appear grossly stable and intact. Partially visible Calcified aortic atherosclerosis. And probable aneurysmal proximal descending thoracic aorta with aberrant right subclavian artery origin. Negative lung apices. IMPRESSION: 1. No acute intracranial abnormality. Stable non contrast CT appearance of the brain. 2. No acute fracture or listhesis identified in the cervical spine. Advanced chronic cervical spine degeneration and chronic degenerative spinal stenosis. 3. Calcified aortic atherosclerosis with probable chronic aneurysmal enlargement of the distal arch and proximal descending thoracic aorta. Electronically Signed   By: Genevie Ann M.D.   On: 12/06/2016 11:33    Procedures Procedures (including critical care time)  Medications Ordered in ED Medications - No data to display   Initial Impression / Assessment and Plan / ED Course  I have reviewed the triage vital signs and the nursing notes.  Pertinent labs & imaging results that were available during my care of the patient were reviewed by me and considered in my medical decision making (see chart for details).     Final Clinical Impressions(s) / ED Diagnoses   Final diagnoses:  Altered mental status, unspecified altered mental status type    New Prescriptions New Prescriptions   No medications on file   I personally preformed the services scribed in my presence. The recorded information has been reviewed is accurate. Bryan Manifold, MD.      Bryan Manifold, MD 12/10/16 [phone]

## 2016-12-06 NOTE — Telephone Encounter (Signed)
Patient was seen in ER today. Dose change for Risperidone 0.5 to Risperidone 0.25mg . Patients wife wanted prescription sent in.

## 2016-12-06 NOTE — ED Triage Notes (Signed)
Pt presents via [facility] for evaluation of increased confusion and fall this AM. EMS reports LKW sometime before midnight last night. Pt lives [facility] at [facility] [place]. Notes that pt has dementia at baseline but was more altered this AM. Wife notes fall approx 20 min before EMS arrived. No blood thinners reported. EMS notes unequal pupils; L 3, R 2.

## 2016-12-06 NOTE — ED Notes (Signed)
Patient transported to CT 

## 2016-12-10 ENCOUNTER — Telehealth: Payer: Self-pay | Admitting: Neurology

## 2016-12-10 ENCOUNTER — Encounter: Payer: Self-pay | Admitting: Neurology

## 2016-12-10 NOTE — Telephone Encounter (Signed)
Patients wife called office to reschedule fu visit from 12/06/16 with Dr. Jannifer Franklin due to being in hospital.  Patient unable to wait until October to be seen.  Are we able to work patient in sooner.  Please call

## 2016-12-10 NOTE — Telephone Encounter (Signed)
Called wife back.  Scheduled appt for 12/18/16 at 12pm, check in 1130am. Wife verbalized understanding and appreciation for call.

## 2016-12-11 DIAGNOSIS — R2681 Unsteadiness on feet: Secondary | ICD-10-CM | POA: Diagnosis not present

## 2016-12-11 DIAGNOSIS — M6281 Muscle weakness (generalized): Secondary | ICD-10-CM | POA: Diagnosis not present

## 2016-12-11 DIAGNOSIS — R296 Repeated falls: Secondary | ICD-10-CM | POA: Diagnosis not present

## 2016-12-11 DIAGNOSIS — Z952 Presence of prosthetic heart valve: Secondary | ICD-10-CM | POA: Diagnosis not present

## 2016-12-11 DIAGNOSIS — R262 Difficulty in walking, not elsewhere classified: Secondary | ICD-10-CM | POA: Diagnosis not present

## 2016-12-11 DIAGNOSIS — G468 Other vascular syndromes of brain in cerebrovascular diseases: Secondary | ICD-10-CM | POA: Diagnosis not present

## 2016-12-13 DIAGNOSIS — G468 Other vascular syndromes of brain in cerebrovascular diseases: Secondary | ICD-10-CM | POA: Diagnosis not present

## 2016-12-13 DIAGNOSIS — R2681 Unsteadiness on feet: Secondary | ICD-10-CM | POA: Diagnosis not present

## 2016-12-13 DIAGNOSIS — Z952 Presence of prosthetic heart valve: Secondary | ICD-10-CM | POA: Diagnosis not present

## 2016-12-13 DIAGNOSIS — R262 Difficulty in walking, not elsewhere classified: Secondary | ICD-10-CM | POA: Diagnosis not present

## 2016-12-13 DIAGNOSIS — R296 Repeated falls: Secondary | ICD-10-CM | POA: Diagnosis not present

## 2016-12-13 DIAGNOSIS — M6281 Muscle weakness (generalized): Secondary | ICD-10-CM | POA: Diagnosis not present

## 2016-12-14 ENCOUNTER — Other Ambulatory Visit: Payer: Self-pay | Admitting: Internal Medicine

## 2016-12-14 DIAGNOSIS — F22 Delusional disorders: Secondary | ICD-10-CM

## 2016-12-14 NOTE — Telephone Encounter (Signed)
rx called into pharmacy

## 2016-12-18 ENCOUNTER — Ambulatory Visit (INDEPENDENT_AMBULATORY_CARE_PROVIDER_SITE_OTHER): Payer: Medicare Other | Admitting: Neurology

## 2016-12-18 ENCOUNTER — Encounter: Payer: Self-pay | Admitting: Neurology

## 2016-12-18 ENCOUNTER — Encounter: Payer: Self-pay | Admitting: Internal Medicine

## 2016-12-18 VITALS — BP 107/63 | HR 62 | Ht 67.0 in | Wt 153.0 lb

## 2016-12-18 DIAGNOSIS — R269 Unspecified abnormalities of gait and mobility: Secondary | ICD-10-CM

## 2016-12-18 DIAGNOSIS — G301 Alzheimer's disease with late onset: Secondary | ICD-10-CM

## 2016-12-18 DIAGNOSIS — F0281 Dementia in other diseases classified elsewhere with behavioral disturbance: Secondary | ICD-10-CM

## 2016-12-18 DIAGNOSIS — F02818 Alzheimer's disease with late onset: Secondary | ICD-10-CM

## 2016-12-18 MED ORDER — CITALOPRAM HYDROBROMIDE 20 MG PO TABS: 20.0000 mg | ORAL_TABLET | Freq: Every day | ORAL | 3 refills | Status: DC

## 2016-12-18 MED ORDER — CITALOPRAM HYDROBROMIDE 10 MG PO TABS: 10.0000 mg | ORAL_TABLET | Freq: Every day | ORAL | 3 refills | Status: DC

## 2016-12-18 NOTE — Progress Notes (Signed)
Reason for visit: Dementia  Bryan Moore is an 83 y.o. male  History of present illness:  Bryan Moore is a 83 year old right handed white male with a history of dementia and agitation. He has delusional thinking, he will need to go to work or that his mother or father is still living. The patient usually sleeps well at night. He will have good days and bad days with agitation. He is not actually having hallucinations. He recently was placed on Zoloft but this resulted in increased agitation, Risperdal resulted in altered mental status. The patient had to go to the emergency room for a stroke evaluation, CT of the brain was done and was unremarkable. The patient has been placed back on the original medication of Celexa at 20 mg daily. He has a significant gait disorder, he tends to lean backwards. He has not had any recent falls. He mainly uses a wheelchair for ambulation, he may walk short distances with his wife with a walker. He eats and drinks fairly well. He returns to this office for an evaluation.  Past Medical History:  Diagnosis Date  . Abnormality of gait 06/07/2016  . Anemia    from AVM's. Requiring transfusion  . Aortic stenosis    s/p TAVI July 2012  . Arthritis   . Balance problem    WEAK ON RIGHT SIDE ( SCOLISOSIS AND HX OF A BACK SURGERY) - BALANCE PROBLEM - "FALLS BACKWARD" - FREQUENT FALLS.  USES CANE WHEN AMBULATING  . Cancer (HCC)    BLADDER CANCER  . Chronic kidney disease   . Complication of anesthesia    VERY DROWSY FOR HOURS AFTER SURGERY - AND DISORIENTATION  . Dementia    more memory problems from stroke  . Fall   . GERD (gastroesophageal reflux disease)    RARE - AND NO MEDS  . GI bleed   . Glaucoma   . Glaucoma   . Heart murmur    PT'S CARDIOLOGIST IS DR. Acie Fredrickson -LAST OFFICE VISIT 03/02/13 IN [facility]  . History of blood transfusion LAST 2-3 YRS AGO   HAD 4 OR 5   . History of cerebral infarction 08/19/2013  . Hypercholesteremia   . Hyperlipemia   .  Hypertension    Not taking Lisinopril due to BP drops low.  . Mitral regurgitation   . PONV (postoperative nausea and vomiting)    IN PAST, DID WELL WITH LAST SURGERY  . Shingles May 2013   right arm residual pain- last outbreak 1 1/2 yrs ago.  . Stroke ([place])    left internal capsule 1/15  . Transfusion history    last 2 yrs ago.    Past Surgical History:  Procedure Laterality Date  . AORTIC VALVE REPLACEMENT     02/2011([facility])  . APPENDECTOMY    . BACK SURGERY  2010 OR 2011   LOWER  . CARDIAC CATHETERIZATION  10/23/2010   ef 65%  . CARDIOVASCULAR STRESS TEST  01/01/2007   EF 64%, NO EVIDENCE OF ISCHEMIA  . CYSTOSCOPY W/ RETROGRADES Bilateral 09/17/2013   Procedure: CYSTOSCOPY WITH BILATERAL RETROGRADE;  Surgeon: [name], MD;  Location: [facility];  Service: Urology;  Laterality: Bilateral;  . CYSTOSCOPY W/ RETROGRADES Bilateral 11/15/2015   Procedure: CYSTO, BILATERAL RETROGRADE PYELOGRAM WITH BLADDER BIOPSY;  Surgeon: [name], MD;  Location: [facility];  Service: Urology;  Laterality: Bilateral;  . CYSTOSCOPY WITH STENT PLACEMENT Left 04/13/2013   Procedure:  LEFT URETERAL STENT PLACEMENT;  Surgeon:  [name], MD;  Location: [facility];  Service: Urology;  Laterality: Left;  . EYE SURGERY     bilateral cataracts with lens implants  . TRANSURETHRAL RESECTION OF BLADDER TUMOR N/A 06/04/2013   Procedure: RESTAGING TRANSURETHRAL RESECTION OF BLADDER TUMOR (TURBT), URETERAL CATHERIZATION ;  Surgeon: [name], MD;  [facility]: [facility];  Service: Urology;  Laterality: N/A;  CYSTO RESTAGING TURBT     . TRANSURETHRAL RESECTION OF BLADDER TUMOR N/A 09/17/2013   Procedure: TRANSURETHRAL RESECTION OF BLADDER TUMOR WITH MITOMYCIN-C;  Surgeon: [name], MD;  [facility];  Service: Urology;  Laterality: N/A;  . TRANSURETHRAL RESECTION OF BLADDER TUMOR WITH GYRUS (TURBT-GYRUS) N/A 04/13/2013   Procedure: TRANSURETHRAL RESECTION OF BLADDER TUMOR WITH GYRUS (TURBT-GYRUS);  Surgeon: [name],  MD;  [facility];  Service: Urology;  Laterality: N/A;  . US ECHOCARDIOGRAPHY  08/31/10   EF 55-60%    Family History  Problem Relation Age of Onset  . COPD Father   . Stroke Father   . Parkinson's disease Brother 33  . Heart failure Mother   . Bowel Disease Sister 13    Social history:  reports that he quit smoking about 35 years ago. His smoking use included Pipe. He has never used smokeless tobacco. He reports that he drinks about 0.6 oz of alcohol per week . He reports that he does not use drugs.    Allergies  Allergen Reactions  . Advil [Ibuprofen] Other (See Comments)    Causes stomach bleeding, need transfusions as result  . Aspirin Other (See Comments)    Causes stomach bleeding, needs transfusion as result  . Penicillins Itching and Swelling    Has patient had a PCN reaction causing immediate rash, facial/tongue/throat swelling, SOB or lightheadedness with hypotension: Unsure Has patient had a PCN reaction causing severe rash involving mucus membranes or skin necrosis: Unsure  Has patient had a PCN reaction that required hospitalization Yes Has patient had a PCN reaction occurring within the last 10 years: No If all of the above answers are "NO", then may proceed with Cephalosporin use.  . Aricept [Donepezil]     Listed on face sheet  . Atorvastatin     Joint ache  . Famciclovir     Listed on face sheet  . Namenda [Memantine]     Stomach pain  . Other Other (See Comments)    No blood thinners due to GI bleed.  . Ativan [Lorazepam] Anxiety    He became very anxious and aggitated    Medications:  Prior to Admission medications   Medication Sig Start Date End Date Taking? Authorizing Provider  acetaminophen (TYLENOL) 500 MG tablet Take 500 mg by mouth every 6 (six) hours as needed for mild pain, moderate pain or headache.   Yes [provider]  ALPRAZolam Duanne Moron) 0.5 MG tablet take 1/2 tablet by mouth three times a day if needed for anxiety 12/14/16   Yes Estill Dooms, MD  amLODipine (NORVASC) 5 MG tablet Take 1 tablet (5 mg total) by mouth daily. 11/05/13  Yes Love, Ivan Anchors, PA-C  cholecalciferol (VITAMIN D) 1000 units tablet Take 1,000 Units by mouth daily.   Yes [provider]  citalopram (CELEXA) 20 MG tablet Take 20 mg by mouth daily.   Yes [provider]  iron polysaccharides (NIFEREX) 150 MG capsule Take 150 mg by mouth every morning.    Yes [provider]  latanoprost (XALATAN) 0.005 % ophthalmic solution Place 1 drop  into both eyes at bedtime. 10/11/15  Yes [provider]  Multiple Vitamin (MULTIVITAMIN) tablet Take 1 tablet by mouth daily.   Yes [provider]  vitamin B-12 (CYANOCOBALAMIN) 1000 MCG tablet Take 1,000 mcg by mouth daily.   Yes [provider]    ROS:  Out of a complete 14 system review of symptoms, the patient complains only of the following symptoms, and all other reviewed systems are negative.  Cough Back pain, walking difficulty Dizziness, speech difficulty, weakness, tremors Agitation, confusion, decreased concentration, anxiety, hallucinations  Blood pressure 107/63, pulse 62, height 5\' 7"  (1.702 m), weight 153 lb (69.4 kg).  Physical Exam  General: The patient is alert and cooperative at the time of the examination. The patient is hard of hearing.  Skin: No significant peripheral edema is noted.   Neurologic Exam  Mental status: The patient is alert and oriented x 1 at the time of the examination (the patient is not oriented to place or date). The Mini-Mental Status Examination done today shows a total score of 6/30.   Cranial nerves: Facial symmetry is present. Speech is normal, no aphasia or dysarthria is noted. Extraocular movements are full. Visual fields are full.  Motor: The patient has good strength in all 4 extremities.  Sensory examination: Soft touch sensation is symmetric on the face, arms, and legs.  Coordination: The  patient has good finger-nose-finger and heel-to-shin bilaterally.  Gait and station: The patient requires assistance with standing. Once up, he has a tendency to lean backwards, he can walk short distances with assistance.  Reflexes: Deep tendon reflexes are symmetric.   Assessment/Plan:  1. Gait disturbance  2. Progressive dementia  3. Delusional thinking, agitation   The patient will be increased slightly on the Celexa taking 30 mg daily. He did not tolerate the Risperdal well at all. The patient has a severe gait disorder, he mainly is wheelchair-bound. He may be a candidate for a research trial involving dementia with agitation. I will make the referral. The patient will follow-up in 6 months.  Jill Alexanders MD 12/18/2016 12:14 PM  [facility] [address] [place], [place] [address]  Phone [phone] Fax [phone]

## 2016-12-18 NOTE — Patient Instructions (Signed)
   We will increase the celexa to 30 mg daily ( take a 20 mg tablet and a 10 mg tablet daily).

## 2016-12-19 DIAGNOSIS — Z952 Presence of prosthetic heart valve: Secondary | ICD-10-CM | POA: Diagnosis not present

## 2016-12-19 DIAGNOSIS — M6281 Muscle weakness (generalized): Secondary | ICD-10-CM | POA: Diagnosis not present

## 2016-12-19 DIAGNOSIS — R2681 Unsteadiness on feet: Secondary | ICD-10-CM | POA: Diagnosis not present

## 2016-12-19 DIAGNOSIS — R296 Repeated falls: Secondary | ICD-10-CM | POA: Diagnosis not present

## 2016-12-19 DIAGNOSIS — G468 Other vascular syndromes of brain in cerebrovascular diseases: Secondary | ICD-10-CM | POA: Diagnosis not present

## 2016-12-19 DIAGNOSIS — R262 Difficulty in walking, not elsewhere classified: Secondary | ICD-10-CM | POA: Diagnosis not present

## 2016-12-20 DIAGNOSIS — M6281 Muscle weakness (generalized): Secondary | ICD-10-CM | POA: Diagnosis not present

## 2016-12-20 DIAGNOSIS — R296 Repeated falls: Secondary | ICD-10-CM | POA: Diagnosis not present

## 2016-12-20 DIAGNOSIS — R262 Difficulty in walking, not elsewhere classified: Secondary | ICD-10-CM | POA: Diagnosis not present

## 2016-12-20 DIAGNOSIS — G468 Other vascular syndromes of brain in cerebrovascular diseases: Secondary | ICD-10-CM | POA: Diagnosis not present

## 2016-12-20 DIAGNOSIS — R2681 Unsteadiness on feet: Secondary | ICD-10-CM | POA: Diagnosis not present

## 2016-12-20 DIAGNOSIS — Z952 Presence of prosthetic heart valve: Secondary | ICD-10-CM | POA: Diagnosis not present

## 2016-12-24 DIAGNOSIS — M6281 Muscle weakness (generalized): Secondary | ICD-10-CM | POA: Diagnosis not present

## 2016-12-24 DIAGNOSIS — G468 Other vascular syndromes of brain in cerebrovascular diseases: Secondary | ICD-10-CM | POA: Diagnosis not present

## 2016-12-24 DIAGNOSIS — R296 Repeated falls: Secondary | ICD-10-CM | POA: Diagnosis not present

## 2016-12-24 DIAGNOSIS — Z952 Presence of prosthetic heart valve: Secondary | ICD-10-CM | POA: Diagnosis not present

## 2016-12-24 DIAGNOSIS — R2681 Unsteadiness on feet: Secondary | ICD-10-CM | POA: Diagnosis not present

## 2016-12-24 DIAGNOSIS — R262 Difficulty in walking, not elsewhere classified: Secondary | ICD-10-CM | POA: Diagnosis not present

## 2016-12-26 DIAGNOSIS — M6281 Muscle weakness (generalized): Secondary | ICD-10-CM | POA: Diagnosis not present

## 2016-12-26 DIAGNOSIS — R296 Repeated falls: Secondary | ICD-10-CM | POA: Diagnosis not present

## 2016-12-26 DIAGNOSIS — Z952 Presence of prosthetic heart valve: Secondary | ICD-10-CM | POA: Diagnosis not present

## 2016-12-26 DIAGNOSIS — R2681 Unsteadiness on feet: Secondary | ICD-10-CM | POA: Diagnosis not present

## 2016-12-26 DIAGNOSIS — R262 Difficulty in walking, not elsewhere classified: Secondary | ICD-10-CM | POA: Diagnosis not present

## 2016-12-26 DIAGNOSIS — G468 Other vascular syndromes of brain in cerebrovascular diseases: Secondary | ICD-10-CM | POA: Diagnosis not present

## 2017-01-01 DIAGNOSIS — G468 Other vascular syndromes of brain in cerebrovascular diseases: Secondary | ICD-10-CM | POA: Diagnosis not present

## 2017-01-01 DIAGNOSIS — Z952 Presence of prosthetic heart valve: Secondary | ICD-10-CM | POA: Diagnosis not present

## 2017-01-01 DIAGNOSIS — R262 Difficulty in walking, not elsewhere classified: Secondary | ICD-10-CM | POA: Diagnosis not present

## 2017-01-01 DIAGNOSIS — R2681 Unsteadiness on feet: Secondary | ICD-10-CM | POA: Diagnosis not present

## 2017-01-01 DIAGNOSIS — M6281 Muscle weakness (generalized): Secondary | ICD-10-CM | POA: Diagnosis not present

## 2017-01-01 DIAGNOSIS — R296 Repeated falls: Secondary | ICD-10-CM | POA: Diagnosis not present

## 2017-01-04 ENCOUNTER — Telehealth: Payer: Self-pay | Admitting: Neurology

## 2017-01-04 NOTE — Telephone Encounter (Signed)
-----   Message from Rayna Sexton sent at 01/04/2017 11:04 AM EDT ----- Regarding: ITI agitation study I looked at [facility]. His MMSEis 6. Cut off is 8..He also has some other health issues that would be exclusionary.   Thank you for the referral.   Grayland Ormond

## 2017-01-04 NOTE — Telephone Encounter (Signed)
I called the wife, the patient did not meet the inclusion criteria for the dementia with agitation trial.

## 2017-01-07 ENCOUNTER — Encounter: Payer: Self-pay | Admitting: Internal Medicine

## 2017-01-07 DIAGNOSIS — Z862 Personal history of diseases of the blood and blood-forming organs and certain disorders involving the immune mechanism: Secondary | ICD-10-CM | POA: Diagnosis not present

## 2017-01-07 DIAGNOSIS — K591 Functional diarrhea: Secondary | ICD-10-CM | POA: Diagnosis not present

## 2017-01-08 DIAGNOSIS — M6281 Muscle weakness (generalized): Secondary | ICD-10-CM | POA: Diagnosis not present

## 2017-01-08 DIAGNOSIS — E7800 Pure hypercholesterolemia, unspecified: Secondary | ICD-10-CM | POA: Diagnosis not present

## 2017-01-08 DIAGNOSIS — N401 Enlarged prostate with lower urinary tract symptoms: Secondary | ICD-10-CM | POA: Diagnosis not present

## 2017-01-08 DIAGNOSIS — R2681 Unsteadiness on feet: Secondary | ICD-10-CM | POA: Diagnosis not present

## 2017-01-08 DIAGNOSIS — D509 Iron deficiency anemia, unspecified: Secondary | ICD-10-CM | POA: Diagnosis not present

## 2017-01-08 DIAGNOSIS — R262 Difficulty in walking, not elsewhere classified: Secondary | ICD-10-CM | POA: Diagnosis not present

## 2017-01-08 DIAGNOSIS — Z952 Presence of prosthetic heart valve: Secondary | ICD-10-CM | POA: Diagnosis not present

## 2017-01-08 DIAGNOSIS — N184 Chronic kidney disease, stage 4 (severe): Secondary | ICD-10-CM | POA: Diagnosis not present

## 2017-01-08 DIAGNOSIS — G468 Other vascular syndromes of brain in cerebrovascular diseases: Secondary | ICD-10-CM | POA: Diagnosis not present

## 2017-01-08 DIAGNOSIS — R296 Repeated falls: Secondary | ICD-10-CM | POA: Diagnosis not present

## 2017-01-08 DIAGNOSIS — E638 Other specified nutritional deficiencies: Secondary | ICD-10-CM | POA: Diagnosis not present

## 2017-01-08 DIAGNOSIS — R42 Dizziness and giddiness: Secondary | ICD-10-CM | POA: Diagnosis not present

## 2017-01-08 DIAGNOSIS — F0390 Unspecified dementia without behavioral disturbance: Secondary | ICD-10-CM | POA: Diagnosis not present

## 2017-01-08 DIAGNOSIS — K591 Functional diarrhea: Secondary | ICD-10-CM | POA: Diagnosis not present

## 2017-01-08 DIAGNOSIS — I10 Essential (primary) hypertension: Secondary | ICD-10-CM | POA: Diagnosis not present

## 2017-01-08 DIAGNOSIS — C679 Malignant neoplasm of bladder, unspecified: Secondary | ICD-10-CM | POA: Diagnosis not present

## 2017-01-16 ENCOUNTER — Encounter: Payer: Self-pay | Admitting: Internal Medicine

## 2017-02-02 DIAGNOSIS — E162 Hypoglycemia, unspecified: Secondary | ICD-10-CM | POA: Diagnosis not present

## 2017-02-02 DIAGNOSIS — E161 Other hypoglycemia: Secondary | ICD-10-CM | POA: Diagnosis not present

## 2017-02-12 ENCOUNTER — Non-Acute Institutional Stay: Payer: Medicare Other | Admitting: Family

## 2017-02-12 ENCOUNTER — Encounter: Payer: Self-pay | Admitting: Family

## 2017-02-12 VITALS — BP 128/60 | HR 73 | Temp 97.4°F | Ht 67.0 in | Wt 157.2 lb

## 2017-02-12 DIAGNOSIS — R531 Weakness: Secondary | ICD-10-CM

## 2017-02-12 DIAGNOSIS — R269 Unspecified abnormalities of gait and mobility: Secondary | ICD-10-CM

## 2017-02-12 DIAGNOSIS — R197 Diarrhea, unspecified: Secondary | ICD-10-CM

## 2017-02-12 MED ORDER — LOPERAMIDE HCL 1 MG/5ML PO LIQD: 2.0000 mg | ORAL | Status: DC | PRN

## 2017-02-12 NOTE — Progress Notes (Signed)
[facility] clinic  Provider: Marlowe Sax FNP-C   Code Status: Full code  Goals of Care:  Advanced Directives 02/12/2017  Does Patient Have a Medical Advance Directive? Yes  Type of Advance Directive [place]  Does patient want to make changes to medical advance directive? -  Copy of [place] in Chart? Yes  Pre-existing out of facility DNR order (yellow form or pink MOST form) -     Chief Complaint  Patient presents with  . Acute Visit    DME for wheelchair; also discuss chronic loose stools    HPI: Patient is a 83 y.o. male seen today for an acute visit for evaluation of worsening unsteady gait and generalized weakness.He is seen in the clinic today accompanied by wife. He denies any acute issues though history limited due to cognitive impairment. Most of the history is given by patient's wife.She states patient's gait is worsening and requires x 2 assistance with transfer. Also states patient unable to ambulate long distance anymore with a walker. He uses walker within the room with assistance but tends to push backwards. She request for a wheelchair.He will require a  standard WC with Cushion, anti tippers, extended brake handles, removable elevating leg rests  to enable him to maintain current level of independence with ADL's which cannot be achieved with walker or cane.   Past Medical History:  Diagnosis Date  . Abnormality of gait 06/07/2016  . Anemia    from AVM's. Requiring transfusion  . Aortic stenosis    s/p TAVI July 2012  . Arthritis   . Balance problem    WEAK ON RIGHT SIDE ( SCOLISOSIS AND HX OF A BACK SURGERY) - BALANCE PROBLEM - "FALLS BACKWARD" - FREQUENT FALLS.  USES CANE WHEN AMBULATING  . Cancer (HCC)    BLADDER CANCER  . Chronic kidney disease   . Complication of anesthesia    VERY DROWSY FOR HOURS AFTER SURGERY - AND DISORIENTATION  . Dementia    more memory problems from stroke  . Fall   . GERD (gastroesophageal  reflux disease)    RARE - AND NO MEDS  . GI bleed   . Glaucoma   . Glaucoma   . Heart murmur    PT'S CARDIOLOGIST IS DR. Acie Fredrickson -LAST OFFICE VISIT 03/02/13 IN [facility]  . History of blood transfusion LAST 2-3 YRS AGO   HAD 4 OR 5   . History of cerebral infarction 08/19/2013  . Hypercholesteremia   . Hyperlipemia   . Hypertension    Not taking Lisinopril due to BP drops low.  . Mitral regurgitation   . PONV (postoperative nausea and vomiting)    IN PAST, DID WELL WITH LAST SURGERY  . Shingles May 2013   right arm residual pain- last outbreak 1 1/2 yrs ago.  . Stroke ([place])    left internal capsule 1/15  . Transfusion history    last 2 yrs ago.    Past Surgical History:  Procedure Laterality Date  . AORTIC VALVE REPLACEMENT     02/2011([facility])  . APPENDECTOMY    . BACK SURGERY  2010 OR 2011   LOWER  . CARDIAC CATHETERIZATION  10/23/2010   ef 65%  . CARDIOVASCULAR STRESS TEST  01/01/2007   EF 64%, NO EVIDENCE OF ISCHEMIA  . CYSTOSCOPY W/ RETROGRADES Bilateral 09/17/2013   Procedure: CYSTOSCOPY WITH BILATERAL RETROGRADE;  Surgeon: [name], MD;  [facility];  Service: Urology;  Laterality: Bilateral;  .  CYSTOSCOPY W/ RETROGRADES Bilateral 11/15/2015   Procedure: CYSTO, BILATERAL RETROGRADE PYELOGRAM WITH BLADDER BIOPSY;  Surgeon: [name], MD;  [facility];  Service: Urology;  Laterality: Bilateral;  . CYSTOSCOPY WITH STENT PLACEMENT Left 04/13/2013   Procedure:  LEFT URETERAL STENT PLACEMENT;  Surgeon: [name], MD;  [facility];  Service: Urology;  Laterality: Left;  . EYE SURGERY     bilateral cataracts with lens implants  . TRANSURETHRAL RESECTION OF BLADDER TUMOR N/A 06/04/2013   Procedure: RESTAGING TRANSURETHRAL RESECTION OF BLADDER TUMOR (TURBT), URETERAL CATHERIZATION ;  Surgeon: [name], MD;  [facility];  Service: Urology;  Laterality: N/A;  CYSTO RESTAGING TURBT     . TRANSURETHRAL RESECTION OF BLADDER TUMOR N/A 09/17/2013   Procedure:  TRANSURETHRAL RESECTION OF BLADDER TUMOR WITH MITOMYCIN-C;  Surgeon: [name], MD;  [facility];  Service: Urology;  Laterality: N/A;  . TRANSURETHRAL RESECTION OF BLADDER TUMOR WITH GYRUS (TURBT-GYRUS) N/A 04/13/2013   Procedure: TRANSURETHRAL RESECTION OF BLADDER TUMOR WITH GYRUS (TURBT-GYRUS);  Surgeon: [name], MD;  [facility];  Service: Urology;  Laterality: N/A;  . US ECHOCARDIOGRAPHY  08/31/10   EF 55-60%    Allergies  Allergen Reactions  . Advil [Ibuprofen] Other (See Comments)    Causes stomach bleeding, need transfusions as result  . Aspirin Other (See Comments)    Causes stomach bleeding, needs transfusion as result  . Penicillins Itching and Swelling    Has patient had a PCN reaction causing immediate rash, facial/tongue/throat swelling, SOB or lightheadedness with hypotension: Unsure Has patient had a PCN reaction causing severe rash involving mucus membranes or skin necrosis: Unsure  Has patient had a PCN reaction that required hospitalization Yes Has patient had a PCN reaction occurring within the last 10 years: No If all of the above answers are "NO", then may proceed with Cephalosporin use.  . Aricept [Donepezil]     Listed on face sheet  . Atorvastatin     Joint ache  . Famciclovir     Listed on face sheet  . Namenda [Memantine]     Stomach pain  . Other Other (See Comments)    No blood thinners due to GI bleed.  . Ativan [Lorazepam] Anxiety    He became very anxious and aggitated    Allergies as of 02/12/2017      Reactions   Advil [ibuprofen] Other (See Comments)   Causes stomach bleeding, need transfusions as result   Aspirin Other (See Comments)   Causes stomach bleeding, needs transfusion as result   Penicillins Itching, Swelling   Has patient had a PCN reaction causing immediate rash, facial/tongue/throat swelling, SOB or lightheadedness with hypotension: Unsure Has patient had a PCN reaction causing severe rash involving mucus membranes  or skin necrosis: Unsure  Has patient had a PCN reaction that required hospitalization Yes Has patient had a PCN reaction occurring within the last 10 years: No If all of the above answers are "NO", then may proceed with Cephalosporin use.   Aricept [donepezil]    Listed on face sheet   Atorvastatin    Joint ache   Famciclovir    Listed on face sheet   Namenda [memantine]    Stomach pain   Other Other (See Comments)   No blood thinners due to GI bleed.   Ativan [lorazepam] Anxiety   He became very anxious and aggitated      Medication List       Accurate as of  02/12/17  4:39 PM. Always use your most recent med list.          acetaminophen 500 MG tablet Commonly known as:  TYLENOL Take 500 mg by mouth every 6 (six) hours as needed for mild pain, moderate pain or headache.   ALPRAZolam 0.5 MG tablet Commonly known as:  XANAX take 1/2 tablet by mouth three times a day if needed for anxiety   amLODipine 5 MG tablet Commonly known as:  NORVASC Take 1 tablet (5 mg total) by mouth daily.   cholecalciferol 1000 units tablet Commonly known as:  VITAMIN D Take 1,000 Units by mouth daily.   citalopram 10 MG tablet Commonly known as:  CELEXA Take 1 tablet (10 mg total) by mouth daily.   citalopram 20 MG tablet Commonly known as:  CELEXA Take 1 tablet (20 mg total) by mouth daily.   iron polysaccharides 150 MG capsule Commonly known as:  NIFEREX Take 150 mg by mouth every morning.   latanoprost 0.005 % ophthalmic solution Commonly known as:  XALATAN Place 1 drop into both eyes at bedtime.   multivitamin tablet Take 1 tablet by mouth daily.   vitamin B-12 1000 MCG tablet Commonly known as:  CYANOCOBALAMIN Take 1,000 mcg by mouth daily.       Review of Systems:  Review of Systems  Constitutional: Positive for fatigue. Negative for appetite change and fever.  HENT: Positive for hearing loss. Negative for congestion, rhinorrhea, sinus pain, sinus pressure,  sneezing and sore throat.   Eyes: Negative.   Respiratory: Negative for cough, chest tightness, shortness of breath and wheezing.   Cardiovascular: Negative for chest pain, palpitations and leg swelling.  Gastrointestinal: Positive for diarrhea. Negative for abdominal distention, abdominal pain, constipation, nausea and vomiting.  Endocrine: Negative.   Genitourinary: Negative for dysuria, flank pain and urgency.  Musculoskeletal: Positive for gait problem.  Skin: Negative for color change, pallor and rash.  Neurological: Positive for tremors. Negative for dizziness, light-headedness and headaches.  Hematological: Does not bruise/bleed easily.  Psychiatric/Behavioral: Positive for confusion and decreased concentration. Negative for agitation, hallucinations and sleep disturbance.    Health Maintenance  Topic Date Due  . PNA vac Low Risk Adult (2 of 2 - PCV13) 03/06/2012  . INFLUENZA VACCINE  03/06/2017  . TETANUS/TDAP  06/01/2026    Physical Exam: Vitals:   02/12/17 1303  BP: 128/60  Pulse: 73  Temp: (!) 97.4 F (36.3 C)  TempSrc: Oral  SpO2: 99%  Weight: 157 lb 3.2 oz (71.3 kg)  Height: 5\' 7"  (1.702 m)   Body mass index is 24.62 kg/m. Physical Exam  Constitutional:  Frail Elderly in no acute distress   HENT:  Head: Normocephalic.  Mouth/Throat: Oropharynx is clear and moist. No oropharyngeal exudate.  HOH hearing aid on left ear  Eyes: Conjunctivae and EOM are normal. Pupils are equal, round, and reactive to light. Right eye exhibits no discharge. Left eye exhibits no discharge. No scleral icterus.  Neck: Normal range of motion. No JVD present. No thyromegaly present.  Cardiovascular: Normal rate, regular rhythm, normal heart sounds and intact distal pulses.   Pulmonary/Chest: Effort normal and breath sounds normal. No respiratory distress. He has no wheezes. He has no rales.  Abdominal: Soft. Bowel sounds are normal. He exhibits no distension. There is no tenderness.  There is no rebound and no guarding.  Musculoskeletal: He exhibits no edema, tenderness or deformity.  Generalized weakness.Unsteady gait  Lymphadenopathy:    He has no cervical adenopathy.  Neurological: He is alert. Coordination abnormal.  Unsteady gait   Skin: Skin is warm and dry. No rash noted. No erythema. No pallor.  Psychiatric: He has a normal mood and affect.   Labs reviewed: Basic Metabolic Panel:  Recent Labs  09/24/16 0001 12/06/16 1023 12/06/16 1038  NA 140 141 141  K 4.4 4.7 4.9  CL 105 110 110  CO2 26 22  --   GLUCOSE 95 105* 101*  BUN 22 22* 30*  CREATININE 1.65* 1.57* 1.60*  CALCIUM 9.2 8.9  --    Liver Function Tests:  Recent Labs  09/24/16 0001 12/06/16 1023  AST 17 26  ALT 9 9*  ALKPHOS 67 61  BILITOT 0.4 1.2  PROT 7.4 7.3  ALBUMIN 3.9 3.4*   CBC:  Recent Labs  09/24/16 0001 12/06/16 1023 12/06/16 1038  WBC 5.3 9.7  --   NEUTROABS 4,187 8.6*  --   HGB 12.1* 11.1* 10.9*  HCT 36.5* 33.6* 32.0*  MCV 94.6 94.9  --   PLT 213 144*  --    Lipid Panel:  Recent Labs  09/24/16 0001  CHOL 167  HDL 46  LDLCALC 98  TRIG 114  CHOLHDL 3.6   Lab Results  Component Value Date   HGBA1C 5.2 08/19/2013    Procedures since last visit: No results found.  Assessment/Plan 1. Abnormality of gait Worsening gait requires x 2 assist with transfer. Unable to use walker on long distance as usual and tends to push backwards when walking. Ordering standard WC with Cushion, anti tippers, extended brake handles, removable elevating leg rests  to enable her to maintain current level of independence with ADL's which cannot be achieved with walker or cane.Physical Therapy to evaluate and treat as indicated for gait, ROM, exercise and muscle strengthening.continue with fall and safety precautions.   2. Generalized weakness Progressive weakness. Appetite reported as good.will have Therapy to work with him for muscle strengthening as above.check lab work as  follows to rule out metabolic etiology. - CBC with Differential/Platelet; Future - Comprehensive metabolic panel; Future - TSH level; Future  3. Diarrhea Watery loose consistency reported by wife upto 3-4 times daily. Encouraged to give Imodium 1 mg/5 ml soln take 10 ml as needed after diarrhea don't exceed 16 mg per day. Encourage fluids intake to prevent dehydration. Will check lab work as follows to rule out infectious etiology. - CBC with Differential/Platelet; Future - Clostridium Difficile by PCR; Future  Labs/tests ordered:  CBC/diff,CMP, TSH level and stool for C-diff.  Next appt:  02/14/2017

## 2017-02-13 ENCOUNTER — Other Ambulatory Visit: Payer: Self-pay

## 2017-02-13 DIAGNOSIS — R197 Diarrhea, unspecified: Secondary | ICD-10-CM

## 2017-02-13 DIAGNOSIS — I10 Essential (primary) hypertension: Secondary | ICD-10-CM

## 2017-02-13 DIAGNOSIS — R531 Weakness: Secondary | ICD-10-CM

## 2017-02-14 ENCOUNTER — Other Ambulatory Visit: Payer: Self-pay | Admitting: Cardiovascular Disease

## 2017-02-14 DIAGNOSIS — R197 Diarrhea, unspecified: Secondary | ICD-10-CM | POA: Diagnosis not present

## 2017-02-14 LAB — CBC WITH DIFFERENTIAL/PLATELET
Basophils Absolute: 0 cells/uL (ref 0–200)
Basophils Relative: 0 %
EOS ABS: 102 {cells}/uL (ref 15–500)
Eosinophils Relative: 2 %
HEMATOCRIT: 34.1 % — AB (ref 38.5–50.0)
HEMOGLOBIN: 11.6 g/dL — AB (ref 13.2–17.1)
Lymphocytes Relative: 11 %
Lymphs Abs: 561 cells/uL — ABNORMAL LOW (ref 850–3900)
MCH: 31.7 pg (ref 27.0–33.0)
MCHC: 34 g/dL (ref 32.0–36.0)
MCV: 93.2 fL (ref 80.0–100.0)
MONO ABS: 510 {cells}/uL (ref 200–950)
MPV: 9.3 fL (ref 7.5–12.5)
Monocytes Relative: 10 %
NEUTROS ABS: 3927 {cells}/uL (ref 1500–7800)
Neutrophils Relative %: 77 %
Platelets: 216 10*3/uL (ref 140–400)
RBC: 3.66 MIL/uL — ABNORMAL LOW (ref 4.20–5.80)
RDW: 14.7 % (ref 11.0–15.0)
WBC: 5.1 10*3/uL (ref 3.8–10.8)

## 2017-02-14 LAB — COMPLETE METABOLIC PANEL WITH GFR
ALT: 6 U/L — AB (ref 9–46)
AST: 12 U/L (ref 10–35)
Albumin: 3.7 g/dL (ref 3.6–5.1)
Alkaline Phosphatase: 73 U/L (ref 40–115)
BUN: 13 mg/dL (ref 7–25)
CHLORIDE: 103 mmol/L (ref 98–110)
CO2: 26 mmol/L (ref 20–31)
CREATININE: 1.3 mg/dL — AB (ref 0.70–1.11)
Calcium: 9 mg/dL (ref 8.6–10.3)
GFR, Est African American: 54 mL/min — ABNORMAL LOW (ref >=60)
GFR, Est Non African American: 47 mL/min — ABNORMAL LOW (ref >=60)
Glucose, Bld: 82 mg/dL (ref 65–99)
Potassium: 3.9 mmol/L (ref 3.5–5.3)
Sodium: 138 mmol/L (ref 135–146)
Total Bilirubin: 0.5 mg/dL (ref 0.2–1.2)
Total Protein: 7.3 g/dL (ref 6.1–8.1)

## 2017-02-14 LAB — TSH: TSH: 2.55 m[IU]/L (ref 0.40–4.50)

## 2017-02-14 MED ORDER — AMLODIPINE BESYLATE 5 MG PO TABS: 5.0000 mg | ORAL_TABLET | Freq: Every day | ORAL | 0 refills | Status: DC

## 2017-02-17 LAB — CLOSTRIDIUM DIFFICILE BY PCR: Toxigenic C. Difficile by PCR: NOT DETECTED

## 2017-02-18 ENCOUNTER — Telehealth: Payer: Self-pay | Admitting: Family

## 2017-02-18 NOTE — Telephone Encounter (Signed)
Discussed with Patient's wife stool specimen results came back negative for C-diff. She states patient still has loose stool. Would like visit with Dr. Bubba Camp. CMA Brittney notified to schedule patient appointment with Dr. Bubba Camp.

## 2017-02-27 ENCOUNTER — Non-Acute Institutional Stay: Payer: Medicare Other | Admitting: Internal Medicine

## 2017-02-27 ENCOUNTER — Encounter: Payer: Self-pay | Admitting: Internal Medicine

## 2017-02-27 VITALS — BP 130/80 | HR 75 | Temp 98.4°F | Resp 16 | Ht 67.0 in | Wt 152.2 lb

## 2017-02-27 DIAGNOSIS — G301 Alzheimer's disease with late onset: Secondary | ICD-10-CM | POA: Diagnosis not present

## 2017-02-27 DIAGNOSIS — K591 Functional diarrhea: Secondary | ICD-10-CM | POA: Diagnosis not present

## 2017-02-27 DIAGNOSIS — F411 Generalized anxiety disorder: Secondary | ICD-10-CM | POA: Diagnosis not present

## 2017-02-27 DIAGNOSIS — F0281 Dementia in other diseases classified elsewhere with behavioral disturbance: Secondary | ICD-10-CM

## 2017-02-27 DIAGNOSIS — F02818 Dementia in other diseases classified elsewhere, unspecified severity, with other behavioral disturbance: Secondary | ICD-10-CM

## 2017-02-27 MED ORDER — ALPRAZOLAM 0.5 MG PO TABS: 0.5000 mg | ORAL_TABLET | Freq: Three times a day (TID) | ORAL | 0 refills | Status: DC | PRN

## 2017-02-27 MED ORDER — DIPHENOXYLATE-ATROPINE 2.5-0.025 MG PO TABS: ORAL_TABLET | ORAL | 0 refills | Status: DC

## 2017-02-27 NOTE — Progress Notes (Signed)
[facility]  Provider: Blanchie Serve MD   Location:  [place] of Service:  Clinic (12)  PCP: No primary care provider on file. Patient Care Team: Ronald Lobo, MD (Gastroenterology) Rolm Bookbinder, MD as Consulting Physician (Dermatology) Nahser, [name], MD as Consulting Physician (Cardiology) Kathrynn Ducking, MD as Consulting Physician (Neurology) [name], MD as Attending Physician (Urology) Luberta Mutter, MD as Consulting Physician (Ophthalmology) Blanchie Serve, MD as Consulting Physician (Internal Medicine)  Extended Emergency Contact Information Primary Emergency Contact: [facility] Address: [address]          [place], [place] [address] [name] of [place] Phone: [phone] Mobile Phone: [phone] Relation: Spouse Secondary Emergency Contact: Fraley,Jeffrey          [place] Merla Riches of [place] Phone: [phone] Mobile Phone: [phone] Relation: Son  Code Status: DNR  Goals of Care: Advanced Directive information Advanced Directives 02/12/2017  Does Patient Have a Medical Advance Directive? Yes  Type of Advance Directive [place]  Does patient want to make changes to medical advance directive? -  Copy of [place] in Chart? Yes  Pre-existing out of facility DNR order (yellow form or pink MOST form) -      Chief Complaint  Patient presents with  . Acute Visit    Patient is still having diarrhea. Yesterday it was watery and today is mushy. Hasn't had a regular bowel movement in 2 months. Patients wife has been givng him a imodium but it makes the patient drowsy so patient is no longer.  Dr. Ival Bible doesnt want to do a colonoscopy because of his age  . Other    Patients wife has concerns of him being prediabetic due to him being groggy and orange juice being the only thing to perk him up.   . Medication Refill   Doesn't need any refills at this time  . Labs Only    Discuss labs with patient     HPI: Patient is a 83 y.o. male seen today for acute visit. He is here with his wife and caregiver. He participates minimally in HPI and ROS. Obtained from wife and caregiver.   He has been having diarrhea x 2 months. Prior to this he had intermittent constipation. He started with 2-3 bowel movement and now has 4-6 loose stool or semi formed stool. He denies any blood in stool. No vomiting reported but occasional dry heaves. Appetite is good. Poor fluid intake. He is currently on iron supplement.   A month back he had an incident of extreme lethargy and had blood sugar checked with reading of 60. No further documented drop in sugar but pt and wife do not have cbg machine in room. No known history of DM. On lab review, blood sugar level is 101.     Past Medical History:  Diagnosis Date  . Abnormality of gait 06/07/2016  . Anemia    from AVM's. Requiring transfusion  . Aortic stenosis    s/p TAVI July 2012  . Arthritis   . Balance problem    WEAK ON RIGHT SIDE ( SCOLISOSIS AND HX OF A BACK SURGERY) - BALANCE PROBLEM - "FALLS BACKWARD" - FREQUENT FALLS.  USES CANE WHEN AMBULATING  . Cancer (HCC)    BLADDER CANCER  . Chronic kidney disease   . Complication of anesthesia    VERY DROWSY FOR HOURS AFTER SURGERY -  AND DISORIENTATION  . Dementia    more memory problems from stroke  . Fall   . GERD (gastroesophageal reflux disease)    RARE - AND NO MEDS  . GI bleed   . Glaucoma   . Glaucoma   . Heart murmur    PT'S CARDIOLOGIST IS DR. Acie Fredrickson -LAST OFFICE VISIT 03/02/13 IN [facility]  . History of blood transfusion LAST 2-3 YRS AGO   HAD 4 OR 5   . History of cerebral infarction 08/19/2013  . Hypercholesteremia   . Hyperlipemia   . Hypertension    Not taking Lisinopril due to BP drops low.  . Mitral regurgitation   . PONV (postoperative nausea and vomiting)    IN PAST, DID WELL WITH LAST SURGERY  . Shingles  May 2013   right arm residual pain- last outbreak 1 1/2 yrs ago.  . Stroke ([place])    left internal capsule 1/15  . Transfusion history    last 2 yrs ago.   Past Surgical History:  Procedure Laterality Date  . AORTIC VALVE REPLACEMENT     02/2011([facility])  . APPENDECTOMY    . BACK SURGERY  2010 OR 2011   LOWER  . CARDIAC CATHETERIZATION  10/23/2010   ef 65%  . CARDIOVASCULAR STRESS TEST  01/01/2007   EF 64%, NO EVIDENCE OF ISCHEMIA  . CYSTOSCOPY W/ RETROGRADES Bilateral 09/17/2013   Procedure: CYSTOSCOPY WITH BILATERAL RETROGRADE;  Surgeon: [name], MD;  Location: [facility];  Service: Urology;  Laterality: Bilateral;  . CYSTOSCOPY W/ RETROGRADES Bilateral 11/15/2015   Procedure: CYSTO, BILATERAL RETROGRADE PYELOGRAM WITH BLADDER BIOPSY;  Surgeon: [name], MD;  Location: [facility];  Service: Urology;  Laterality: Bilateral;  . CYSTOSCOPY WITH STENT PLACEMENT Left 04/13/2013   Procedure:  LEFT URETERAL STENT PLACEMENT;  Surgeon: [name], MD;  Location: [facility];  Service: Urology;  Laterality: Left;  . EYE SURGERY     bilateral cataracts with lens implants  . TRANSURETHRAL RESECTION OF BLADDER TUMOR N/A 06/04/2013   Procedure: RESTAGING TRANSURETHRAL RESECTION OF BLADDER TUMOR (TURBT), URETERAL CATHERIZATION ;  Surgeon: [name], MD;  Location: [facility];  Service: Urology;  Laterality: N/A;  CYSTO RESTAGING TURBT     . TRANSURETHRAL RESECTION OF BLADDER TUMOR N/A 09/17/2013   Procedure: TRANSURETHRAL RESECTION OF BLADDER TUMOR WITH MITOMYCIN-C;  Surgeon: [name], MD;  Location: [facility];  Service: Urology;  Laterality: N/A;  . TRANSURETHRAL RESECTION OF BLADDER TUMOR WITH GYRUS (TURBT-GYRUS) N/A 04/13/2013   Procedure: TRANSURETHRAL RESECTION OF BLADDER TUMOR WITH GYRUS (TURBT-GYRUS);  Surgeon: [name], MD;  Location: [facility];  Service: Urology;  Laterality: N/A;  . US ECHOCARDIOGRAPHY  08/31/10   EF 55-60%    reports that he quit smoking about 35 years ago. His smoking use included  Pipe. He has never used smokeless tobacco. He reports that he drinks about 0.6 oz of alcohol per week . He reports that he does not use drugs. Social History   Social History  . Marital status: Married    Spouse name: N/A  . Number of children: 2  . Years of education: College   Occupational History  . retired Hotel manager     Social History Main Topics  . Smoking status: Former Smoker    Types: Pipe    Quit date: 07/04/1981  . Smokeless tobacco: Never Used  . Alcohol use 0.6 oz/week    1 Glasses of wine per week  . Drug use: No  . Sexual  activity: Not Currently   Other Topics Concern  . Not on file   Social History Narrative   [facility]/13/[facility]   Patient lives at home with his wife has 2 children.   Married 1960.   Patient is left handed.   Patient has college education.   Patient drink 4 cups daily.   Smoked pipe 20-35 years    Alcohol 1 glass of wine per day    Exercise no           Family History  Problem Relation Age of Onset  . COPD Father   . Stroke Father   . Parkinson's disease Brother 81  . Heart failure Mother   . Bowel Disease Sister 75    Health Maintenance  Topic Date Due  . PNA vac Low Risk Adult (2 of 2 - PCV13) 08/06/2017 (Originally 03/06/2012)  . INFLUENZA VACCINE  03/06/2017  . TETANUS/TDAP  06/01/2026    Allergies  Allergen Reactions  . Advil [Ibuprofen] Other (See Comments)    Causes stomach bleeding, need transfusions as result  . Aspirin Other (See Comments)    Causes stomach bleeding, needs transfusion as result  . Penicillins Itching and Swelling    Has patient had a PCN reaction causing immediate rash, facial/tongue/throat swelling, SOB or lightheadedness with hypotension: Unsure Has patient had a PCN reaction causing severe rash involving mucus membranes or skin necrosis: Unsure  Has patient had a PCN reaction that required hospitalization Yes Has patient had a PCN reaction occurring within the last 10 years:  No If all of the above answers are "NO", then may proceed with Cephalosporin use.  . Aricept [Donepezil]     Listed on face sheet  . Atorvastatin     Joint ache  . Famciclovir     Listed on face sheet  . Namenda [Memantine]     Stomach pain  . Other Other (See Comments)    No blood thinners due to GI bleed.  . Ativan [Lorazepam] Anxiety    He became very anxious and aggitated    Outpatient Encounter Prescriptions as of 02/27/2017  Medication Sig  . acetaminophen (TYLENOL) 500 MG tablet Take 500 mg by mouth every 6 (six) hours as needed for mild pain, moderate pain or headache.  . ALPRAZolam (XANAX) 0.5 MG tablet take 1/2 tablet by mouth three times a day if needed for anxiety  . amLODipine (NORVASC) 5 MG tablet Take 1 tablet (5 mg total) by mouth daily.  . cholecalciferol (VITAMIN D) 1000 units tablet Take 1,000 Units by mouth daily.  . citalopram (CELEXA) 10 MG tablet Take 1 tablet (10 mg total) by mouth daily.  . iron polysaccharides (NIFEREX) 150 MG capsule Take 150 mg by mouth every morning.   . latanoprost (XALATAN) 0.005 % ophthalmic solution Place 1 drop into both eyes at bedtime.  . Multiple Vitamin (MULTIVITAMIN) tablet Take 1 tablet by mouth daily.  . vitamin B-12 (CYANOCOBALAMIN) 1000 MCG tablet Take 1,000 mcg by mouth daily.  . citalopram (CELEXA) 20 MG tablet Take 1 tablet (20 mg total) by mouth daily. (Patient not taking: Reported on 02/27/2017)   Facility-Administered Encounter Medications as of 02/27/2017  Medication  . loperamide (IMODIUM) 1 MG/5ML solution 2 mg    Review of Systems  Constitutional: Negative for appetite change, chills, diaphoresis and fever.  HENT: Negative for congestion and rhinorrhea.   Respiratory: Negative for cough, choking and shortness of breath.   Cardiovascular: Negative for chest pain and  palpitations.  Gastrointestinal: Negative for abdominal pain, blood in stool, nausea and vomiting.  Genitourinary: Negative for dysuria.   Musculoskeletal: Positive for gait problem.  Neurological: Positive for weakness. Negative for dizziness.  Psychiatric/Behavioral: Positive for confusion. The patient is nervous/anxious.     Vitals:   02/27/17 1134  BP: 130/80  Pulse: 75  Resp: 16  Temp: 98.4 F (36.9 C)  TempSrc: Oral  SpO2: 92%  Weight: 152 lb 3.2 oz (69 kg)  Height: _0  (1.702 m)   Body mass index is 23.84 kg/m. Physical Exam  Constitutional: He appears well-developed and well-nourished. No distress.  HENT:  Head: Normocephalic and atraumatic.  Mouth/Throat: Oropharynx is clear and moist.  Eyes: Pupils are equal, round, and reactive to light. Conjunctivae are normal.  Neck: Normal range of motion. Neck supple. No thyromegaly present.  Cardiovascular: Normal rate and regular rhythm.   Pulmonary/Chest: Effort normal and breath sounds normal. No respiratory distress. He has no wheezes.  Abdominal: Soft. Bowel sounds are normal. He exhibits no distension and no mass. There is no tenderness. There is no rebound and no guarding.  Musculoskeletal: He exhibits edema.  Trace leg edema, on wheelchair, can move all 4 extremities  Lymphadenopathy:    He has no cervical adenopathy.  Neurological: He is alert.  Oriented only to self  Skin: Skin is warm and dry. He is not diaphoretic.  Psychiatric:  anxious    Labs reviewed: Basic Metabolic Panel:  Recent Labs  09/24/16 0001 12/06/16 1023 12/06/16 1038 02/13/17 1115  NA 140 141 141 138  K 4.4 4.7 4.9 3.9  CL 105 110 110 103  CO2 26 22  --  26  GLUCOSE 95 105* 101* 82  BUN 22 22* 30* 13  CREATININE 1.65* 1.57* 1.60* 1.30*  CALCIUM 9.2 8.9  --  9.0   Liver Function Tests:  Recent Labs  09/24/16 0001 12/06/16 1023 02/13/17 1115  AST _1 ALT 9 9* 6*  ALKPHOS 67 61 73  BILITOT 0.4 1.2 0.5  PROT 7.4 7.3 7.3  ALBUMIN 3.9 3.4* 3.7   No results for input(s): LIPASE, AMYLASE in the last 8760 hours. No results for input(s): AMMONIA in the  last 8760 hours. CBC:  Recent Labs  09/24/16 0001 12/06/16 1023 12/06/16 1038 02/13/17 1115  WBC 5.3 9.7  --  5.1  NEUTROABS 4,187 8.6*  --  3,927  HGB 12.1* 11.1* 10.9* 11.6*  HCT 36.5* 33.6* 32.0* 34.1*  MCV 94.6 94.9  --  93.2  PLT 213 144*  --  216   Cardiac Enzymes: No results for input(s): CKTOTAL, CKMB, CKMBINDEX, TROPONINI in the last 8760 hours. BNP: Invalid input(s): POCBNP Lab Results  Component Value Date   HGBA1C 5.2 08/19/2013   Lab Results  Component Value Date   TSH 2.55 02/13/2017   Lab Results  Component Value Date   VITAMINB12 1,314 (H) 11/17/2013   No results found for: FOLATE No results found for: IRON, TIBC, FERRITIN  Lipid Panel:  Recent Labs  09/24/16 0001  CHOL 167  HDL 46  LDLCALC 98  TRIG 114  CHOLHDL 3.6   Lab Results  Component Value Date   HGBA1C 5.2 08/19/2013    Procedures since last visit: No results found.  Assessment/Plan  1. Functional diarrhea Has history of anxiety and usually had constipation before. Now has diarrhea x 2 months. Infectious workup has been negative, normal electrolyte level. Family does not want imodium as they dont think it is  helping him. Will try diphenoxylate-atropine tid x 3 days, then bid and reassess in 2 weeks. Adjust dosing of alprazolam as below.   - CMP with eGFR; Future - Magnesium; Future  2. Late onset Alzheimer's disease with behavioral disturbance Has been on memantine and risperidone before and did not tolerate them. Supportive care. Adjust dosing of alprazolam to see if this will help with his anxiety. Reviewed goals of care and patient is DNR.  3. GAD (generalized anxiety disorder) Change alprazolam to 0.5 mg tid prn for now and monitor   Labs/tests ordered:  Cmp, mg  Next appointment: 2 weeks  Communication: reviewed care plan with pt, his wife and caregiver.    Blanchie Serve, MD Internal Medicine [facility] [address] [place], [place] [address] Cell Phone (Monday-Friday 8 am - 5 pm): [phone] On Call: [phone] and follow prompts after 5 pm and on weekends Office Phone: [phone] Office Fax: [phone]

## 2017-02-28 ENCOUNTER — Telehealth: Payer: Self-pay | Admitting: *Deleted

## 2017-02-28 ENCOUNTER — Other Ambulatory Visit: Payer: Self-pay

## 2017-02-28 DIAGNOSIS — K591 Functional diarrhea: Secondary | ICD-10-CM

## 2017-02-28 MED ORDER — DIPHENOXYLATE-ATROPINE 2.5-0.025 MG PO TABS: ORAL_TABLET | ORAL | 0 refills | Status: DC

## 2017-02-28 NOTE — Telephone Encounter (Signed)
[facility] [address] they confirmed that they haven't received a script and that he couldn't be e-scribed due to it being a controlled substance. I called the patients wife but was unable to reach her so I left her a voicemail asking did Dr. Bubba Camp give her script or does she need to write one. I will touch base with Dr. Bubba Camp once she returns from her meeting

## 2017-02-28 NOTE — Telephone Encounter (Signed)
Patient wife, Danton Clap called and stated that Dr. Bubba Camp was to fax a Rx to pharmacy for the medications given yesterday but the pharmacy never received. She wants to know what happened to it. Please call patient.   *message left on Brittany's phone.

## 2017-02-28 NOTE — Telephone Encounter (Signed)
Tried to fax over the script but I kept getting a error message. Called the pharmacy and gave a verbal order. Pharmacy stated that they would handle it now

## 2017-03-05 ENCOUNTER — Encounter: Payer: Self-pay | Admitting: Family

## 2017-03-05 ENCOUNTER — Telehealth: Payer: Self-pay | Admitting: *Deleted

## 2017-03-05 ENCOUNTER — Non-Acute Institutional Stay: Payer: Medicare Other | Admitting: Family

## 2017-03-05 VITALS — BP 140/70 | HR 78 | Temp 97.2°F | Ht 67.0 in | Wt 152.0 lb

## 2017-03-05 DIAGNOSIS — L309 Dermatitis, unspecified: Secondary | ICD-10-CM

## 2017-03-05 NOTE — Telephone Encounter (Signed)
Spoke with patient's wife and appt scheduled today with Dinah.

## 2017-03-05 NOTE — Progress Notes (Signed)
Location:  [facility] of Service:  Clinic (12) Provider: Raenell Mensing FNP-C  Blanchie Serve, MD  Patient Care Team: Blanchie Serve, MD as PCP - General (Internal Medicine) Ronald Lobo, MD (Gastroenterology) Rolm Bookbinder, MD as Consulting Physician (Dermatology) Nahser, [name], MD as Consulting Physician (Cardiology) Kathrynn Ducking, MD as Consulting Physician (Neurology) [name], MD as Attending Physician (Urology) Luberta Mutter, MD as Consulting Physician (Ophthalmology) Blanchie Serve, MD as Consulting Physician (Internal Medicine)  Extended Emergency Contact Information Primary Emergency Contact: [facility] Address: [address] [place]          [place], [place] [address] [name] of [place] Phone: [phone] Mobile Phone: [phone] Relation: Spouse Secondary Emergency Contact: [place] , [place] Phone: [phone] Mobile Phone: [phone] Relation: Son  Goals of care: Advanced Directive information Advanced Directives 03/05/2017  Does Patient Have a Medical Advance Directive? Yes  Type of Advance Directive Out of facility DNR (pink MOST or yellow form);[place];Living will  Does patient want to make changes to medical advance directive? -  Copy of [place] in Chart? -  Pre-existing out of facility DNR order (yellow form or pink MOST form) -     Chief Complaint  Patient presents with  . Acute Visit    Rash left thigh--? med reaction or shingles    HPI:  Pt is a 83 y.o. male seen today at [facility] for an acute visit for evaluation of left leg rash. He is seen in the clinic today with wife and sitter present.Patient's wife states concerned about left thigh rash that started two days ago after starting on lomotil for diarrhea.she is worried that rash might be a reaction to the medication or could be shingles.Rash  limited to left thigh no other rash in elsewhere in the body.care giver states no itching or complains of pain on the rash site. No fever, chills, change in lotion or soap. HPI and ROS limited due to alzheimer's disease.    Past Medical History:  Diagnosis Date  . Abnormality of gait 06/07/2016  . Anemia    from AVM's. Requiring transfusion  . Aortic stenosis    s/p TAVI July 2012  . Arthritis   . Balance problem    WEAK ON RIGHT SIDE ( SCOLISOSIS AND HX OF A BACK SURGERY) - BALANCE PROBLEM - "FALLS BACKWARD" - FREQUENT FALLS.  USES CANE WHEN AMBULATING  . Cancer (HCC)    BLADDER CANCER  . Chronic kidney disease   . Complication of anesthesia    VERY DROWSY FOR HOURS AFTER SURGERY - AND DISORIENTATION  . Dementia    more memory problems from stroke  . Fall   . GERD (gastroesophageal reflux disease)    RARE - AND NO MEDS  . GI bleed   . Glaucoma   . Glaucoma   . Heart murmur    PT'S CARDIOLOGIST IS DR. Acie Fredrickson -LAST OFFICE VISIT 03/02/13 IN [facility]  . History of blood transfusion LAST 2-3 YRS AGO   HAD 4 OR 5   . History of cerebral infarction 08/19/2013  . Hypercholesteremia   . Hyperlipemia   . Hypertension    Not taking Lisinopril due to BP drops low.  . Mitral regurgitation   . PONV (postoperative nausea and vomiting)    IN PAST, DID WELL WITH LAST SURGERY  . Shingles May 2013  right arm residual pain- last outbreak 1 1/2 yrs ago.  . Stroke ([place])    left internal capsule 1/15  . Transfusion history    last 2 yrs ago.   Past Surgical History:  Procedure Laterality Date  . AORTIC VALVE REPLACEMENT     02/2011([facility])  . APPENDECTOMY    . BACK SURGERY  2010 OR 2011   LOWER  . CARDIAC CATHETERIZATION  10/23/2010   ef 65%  . CARDIOVASCULAR STRESS TEST  01/01/2007   EF 64%, NO EVIDENCE OF ISCHEMIA  . CYSTOSCOPY W/ RETROGRADES Bilateral 09/17/2013   Procedure: CYSTOSCOPY WITH BILATERAL RETROGRADE;  Surgeon: [name], MD;  Location: [facility];  Service: Urology;   Laterality: Bilateral;  . CYSTOSCOPY W/ RETROGRADES Bilateral 11/15/2015   Procedure: CYSTO, BILATERAL RETROGRADE PYELOGRAM WITH BLADDER BIOPSY;  Surgeon: [name], MD;  Location: [facility];  Service: Urology;  Laterality: Bilateral;  . CYSTOSCOPY WITH STENT PLACEMENT Left 04/13/2013   Procedure:  LEFT URETERAL STENT PLACEMENT;  Surgeon: [name], MD;  Location: [facility];  Service: Urology;  Laterality: Left;  . EYE SURGERY     bilateral cataracts with lens implants  . TRANSURETHRAL RESECTION OF BLADDER TUMOR N/A 06/04/2013   Procedure: RESTAGING TRANSURETHRAL RESECTION OF BLADDER TUMOR (TURBT), URETERAL CATHERIZATION ;  Surgeon: [name], MD;  Location: [facility];  Service: Urology;  Laterality: N/A;  CYSTO RESTAGING TURBT     . TRANSURETHRAL RESECTION OF BLADDER TUMOR N/A 09/17/2013   Procedure: TRANSURETHRAL RESECTION OF BLADDER TUMOR WITH MITOMYCIN-C;  Surgeon: [name], MD;  Location: [facility];  Service: Urology;  Laterality: N/A;  . TRANSURETHRAL RESECTION OF BLADDER TUMOR WITH GYRUS (TURBT-GYRUS) N/A 04/13/2013   Procedure: TRANSURETHRAL RESECTION OF BLADDER TUMOR WITH GYRUS (TURBT-GYRUS);  Surgeon: [name], MD;  Location: [facility];  Service: Urology;  Laterality: N/A;  . US ECHOCARDIOGRAPHY  08/31/10   EF 55-60%    Allergies  Allergen Reactions  . Advil [Ibuprofen] Other (See Comments)    Causes stomach bleeding, need transfusions as result  . Aspirin Other (See Comments)    Causes stomach bleeding, needs transfusion as result  . Penicillins Itching and Swelling    Has patient had a PCN reaction causing immediate rash, facial/tongue/throat swelling, SOB or lightheadedness with hypotension: Unsure Has patient had a PCN reaction causing severe rash involving mucus membranes or skin necrosis: Unsure  Has patient had a PCN reaction that required hospitalization Yes Has patient had a PCN reaction occurring within the last 10 years: No If all of the above answers are "NO", then may  proceed with Cephalosporin use.  . Aricept [Donepezil]     Listed on face sheet  . Atorvastatin     Joint ache  . Famciclovir     Listed on face sheet  . Namenda [Memantine]     Stomach pain  . Other Other (See Comments)    No blood thinners due to GI bleed.  . Ativan [Lorazepam] Anxiety    He became very anxious and aggitated    Allergies as of 03/05/2017      Reactions   Advil [ibuprofen] Other (See Comments)   Causes stomach bleeding, need transfusions as result   Aspirin Other (See Comments)   Causes stomach bleeding, needs transfusion as result   Penicillins Itching, Swelling   Has patient had a PCN reaction causing immediate rash, facial/tongue/throat swelling, SOB or lightheadedness with hypotension: Unsure Has patient had a PCN reaction causing severe rash involving mucus membranes  or skin necrosis: Unsure  Has patient had a PCN reaction that required hospitalization Yes Has patient had a PCN reaction occurring within the last 10 years: No If all of the above answers are "NO", then may proceed with Cephalosporin use.   Aricept [donepezil]    Listed on face sheet   Atorvastatin    Joint ache   Famciclovir    Listed on face sheet   Namenda [memantine]    Stomach pain   Other Other (See Comments)   No blood thinners due to GI bleed.   Ativan [lorazepam] Anxiety   He became very anxious and aggitated      Medication List       Accurate as of 03/05/17  4:58 PM. Always use your most recent med list.          acetaminophen 500 MG tablet Commonly known as:  TYLENOL Take 500 mg by mouth every 6 (six) hours as needed for mild pain, moderate pain or headache.   ALPRAZolam 0.5 MG tablet Commonly known as:  XANAX Take 1 tablet (0.5 mg total) by mouth 3 (three) times daily as needed for anxiety (hold for sedation).   amLODipine 5 MG tablet Commonly known as:  NORVASC Take 1 tablet (5 mg total) by mouth daily.   cholecalciferol 1000 units tablet Commonly known as:   VITAMIN D Take 1,000 Units by mouth daily.   citalopram 10 MG tablet Commonly known as:  CELEXA Take 1 tablet (10 mg total) by mouth daily.   diphenoxylate-atropine 2.5-0.025 MG tablet Commonly known as:  LOMOTIL start taking one tablet three times a day with meals x 3 days and then twice a day until next visit   iron polysaccharides 150 MG capsule Commonly known as:  NIFEREX Take 150 mg by mouth every morning.   latanoprost 0.005 % ophthalmic solution Commonly known as:  XALATAN Place 1 drop into both eyes at bedtime.   multivitamin tablet Take 1 tablet by mouth daily.   vitamin B-12 1000 MCG tablet Commonly known as:  CYANOCOBALAMIN Take 1,000 mcg by mouth daily.       Review of Systems  Constitutional: Negative for appetite change, chills, diaphoresis, fatigue and fever.  HENT: Negative for congestion, rhinorrhea, sinus pain, sneezing, sore throat and trouble swallowing.   Eyes: Negative.   Respiratory: Negative for cough, choking and shortness of breath.   Cardiovascular: Negative for chest pain and palpitations.  Gastrointestinal: Negative for abdominal pain, blood in stool, nausea and vomiting.       Diarrhea improved with lomotil   Musculoskeletal: Positive for gait problem.  Skin: Positive for rash. Negative for color change, pallor and wound.  Psychiatric/Behavioral: Positive for confusion. Negative for agitation and sleep disturbance.    Immunization History  Administered Date(s) Administered  . Influenza Nasal 05/13/2016  . Pneumococcal-Unspecified 03/07/2011  . Tdap 02/18/2015, 06/01/2016   Pertinent  Health Maintenance Due  Topic Date Due  . PNA vac Low Risk Adult (2 of 2 - PCV13) 08/06/2017 (Originally 03/06/2012)  . INFLUENZA VACCINE  03/06/2017    Vitals:   03/05/17 1332  BP: 140/70  Pulse: 78  Temp: (!) 97.2 F (36.2 C)  TempSrc: Oral  SpO2: 99%  Weight: 152 lb (68.9 kg)  Height: 5\' 7"  (1.702 m)   Body mass index is 23.81  kg/m. Physical Exam  Constitutional:  Frail Elderly in no acute distress   HENT:  Head: Normocephalic.  Mouth/Throat: Oropharynx is clear and moist. No oropharyngeal exudate.  HOH hearing aid on left ear  Eyes: Pupils are equal, round, and reactive to light. Conjunctivae and EOM are normal. Right eye exhibits no discharge. Left eye exhibits no discharge. No scleral icterus.  Neck: Normal range of motion. No JVD present. No thyromegaly present.  Cardiovascular: Normal rate, regular rhythm, normal heart sounds and intact distal pulses.   Pulmonary/Chest: Effort normal and breath sounds normal. No respiratory distress. He has no wheezes. He has no rales.  Abdominal: Soft. Bowel sounds are normal. He exhibits no distension. There is no tenderness. There is no rebound and no guarding.  Musculoskeletal: He exhibits no edema or tenderness.  Unsteady gait on wheelchair   Lymphadenopathy:    He has no cervical adenopathy.  Neurological: He is alert. Coordination abnormal.  Skin: Skin is warm and dry. No rash noted. No pallor.  Left lower thigh area superficial diffuse nonblanchable, nonraised erythema without any drainage. Skin tissues not warm or tender to touch.   Psychiatric: He has a normal mood and affect.    Labs reviewed:  Recent Labs  09/24/16 0001 12/06/16 1023 12/06/16 1038 02/13/17 1115  NA 140 141 141 138  K 4.4 4.7 4.9 3.9  CL 105 110 110 103  CO2 26 22  --  26  GLUCOSE 95 105* 101* 82  BUN 22 22* 30* 13  CREATININE 1.65* 1.57* 1.60* 1.30*  CALCIUM 9.2 8.9  --  9.0    Recent Labs  09/24/16 0001 12/06/16 1023 02/13/17 1115  AST 17 26 12   ALT 9 9* 6*  ALKPHOS 67 61 73  BILITOT 0.4 1.2 0.5  PROT 7.4 7.3 7.3  ALBUMIN 3.9 3.4* 3.7    Recent Labs  09/24/16 0001 12/06/16 1023 12/06/16 1038 02/13/17 1115  WBC 5.3 9.7  --  5.1  NEUTROABS 4,187 8.6*  --  3,927  HGB 12.1* 11.1* 10.9* 11.6*  HCT 36.5* 33.6* 32.0* 34.1*  MCV 94.6 94.9  --  93.2  PLT 213 144*   --  216   Lab Results  Component Value Date   TSH 2.55 02/13/2017   Lab Results  Component Value Date   HGBA1C 5.2 08/19/2013   Lab Results  Component Value Date   CHOL 167 09/24/2016   HDL 46 09/24/2016   LDLCALC 98 09/24/2016   TRIG 114 09/24/2016   CHOLHDL 3.6 09/24/2016    Significant Diagnostic Results in last 30 days:  No results found.  Assessment/Plan   Dermatitis Left lower thigh area superficial diffuse nonblanchable, nonraised erythema without any drainage. Skin tissues not warm or tender to touch.No vesicles or rash noted on exam. Reassured patient/wife that skin redness not likely to be shingles due to it's presentation. No generalized redness or urticaria for clear diagnosis of allergic reaction to lomotil.Encouraged patient's wife to hold lomotil for now and continue to monitor left thigh area for worsening redness.Call if worsening. Patient's wife verbalized understanding.     Family/ staff Communication: Reviewed plan of care with patient, patient's wife and care giver.  Labs/tests ordered: None   Return: As needed.   Sandrea Hughs, NP

## 2017-03-05 NOTE — Telephone Encounter (Signed)
Bryan Moore can see the patient today by opening a clinic slot if it works for pt and family. If not, ask if patient has blisters or open sores with this rash/ is the rash spreading further? If yes, pt needs to be seen right away by Bryan Moore. If not and if pt cannot make it to clinic today, please open clinic slot for tomorrow afternoon for 2 pm and ask for pt to be brought to the appointment. Also, ask patient to hold lomotil (diphenoxylate-atropine) for now.   Thanks

## 2017-03-05 NOTE — Telephone Encounter (Signed)
Patient wife, Danton Clap called the office and stated that patient saw Dr. Bubba Camp last week on Wednesday for diarrhea and given a medication called Diphenoxylate. Wife stated that now patient has a painful rash going down his leg. She stated that she is not sure if it is a allergic reaction to the medication or shingles and is requesting a sooner appointment there at [facility] to be evaluated. No available appointments for tomorrow on the schedule.  Please Advise  Danton Clap (wife) Home: [phone] Cell:  [phone]

## 2017-03-11 DIAGNOSIS — K591 Functional diarrhea: Secondary | ICD-10-CM | POA: Diagnosis not present

## 2017-03-11 LAB — COMPLETE METABOLIC PANEL WITH GFR
ALT: 8 U/L — ABNORMAL LOW (ref 9–46)
AST: 13 U/L (ref 10–35)
Albumin: 3.9 g/dL (ref 3.6–5.1)
Alkaline Phosphatase: 66 U/L (ref 40–115)
BILIRUBIN TOTAL: 0.5 mg/dL (ref 0.2–1.2)
BUN: 14 mg/dL (ref 7–25)
CO2: 25 mmol/L (ref 20–32)
Calcium: 8.9 mg/dL (ref 8.6–10.3)
Chloride: 103 mmol/L (ref 98–110)
Creat: 1.24 mg/dL — ABNORMAL HIGH (ref 0.70–1.11)
GFR, EST NON AFRICAN AMERICAN: 49 mL/min — AB (ref >=60)
GFR, Est African American: 57 mL/min — ABNORMAL LOW (ref >=60)
GLUCOSE: 84 mg/dL (ref 65–99)
POTASSIUM: 3.4 mmol/L — AB (ref 3.5–5.3)
SODIUM: 140 mmol/L (ref 135–146)
TOTAL PROTEIN: 7.3 g/dL (ref 6.1–8.1)

## 2017-03-11 LAB — MAGNESIUM: Magnesium: 2 mg/dL (ref 1.5–2.5)

## 2017-03-13 ENCOUNTER — Non-Acute Institutional Stay: Payer: Medicare Other | Admitting: Internal Medicine

## 2017-03-13 ENCOUNTER — Encounter: Payer: Self-pay | Admitting: Internal Medicine

## 2017-03-13 VITALS — BP 136/72 | HR 90 | Temp 97.7°F | Resp 16 | Ht 67.0 in

## 2017-03-13 DIAGNOSIS — E876 Hypokalemia: Secondary | ICD-10-CM

## 2017-03-13 DIAGNOSIS — F411 Generalized anxiety disorder: Secondary | ICD-10-CM

## 2017-03-13 DIAGNOSIS — R531 Weakness: Secondary | ICD-10-CM | POA: Diagnosis not present

## 2017-03-13 DIAGNOSIS — R197 Diarrhea, unspecified: Secondary | ICD-10-CM

## 2017-03-13 DIAGNOSIS — Z7189 Other specified counseling: Secondary | ICD-10-CM

## 2017-03-13 MED ORDER — LOPERAMIDE HCL 2 MG PO TABS: 2.0000 mg | ORAL_TABLET | Freq: Four times a day (QID) | ORAL | 0 refills | Status: DC | PRN

## 2017-03-13 NOTE — Progress Notes (Signed)
[facility]  Provider: Blanchie Serve MD   Location:  [place] of Service:  Clinic (12)  PCP: Blanchie Serve, MD Patient Care Team: Blanchie Serve, MD as PCP - General (Internal Medicine) Ronald Lobo, MD (Gastroenterology) Rolm Bookbinder, MD as Consulting Physician (Dermatology) Nahser, [name], MD as Consulting Physician (Cardiology) Kathrynn Ducking, MD as Consulting Physician (Neurology) [name], MD as Attending Physician (Urology) Luberta Mutter, MD as Consulting Physician (Ophthalmology) Blanchie Serve, MD as Consulting Physician (Internal Medicine)  Extended Emergency Contact Information Primary Emergency Contact: [facility] Address: [address]          [place], [place] [address] [name] of [place] Phone: [phone] Mobile Phone: [phone] Relation: Spouse Secondary Emergency Contact: Paez,Jeffrey          [place] Merla Riches of [place] Phone: [phone] Mobile Phone: [phone] Relation: Son  Code Status: DNR  Goals of Care: Advanced Directive information Advanced Directives 03/05/2017  Does Patient Have a Medical Advance Directive? Yes  Type of Advance Directive Out of facility DNR (pink MOST or yellow form);[place];Living will  Does patient want to make changes to medical advance directive? -  Copy of [place] in Chart? -  Pre-existing out of facility DNR order (yellow form or pink MOST form) -      Chief Complaint  Patient presents with  . Acute Visit    2 week follow up. Still having issues with diarrhea. Having issues with walking and standing. Patient's wife has stopped giving him the lomotil due to it making him drowsy.   . Medication Refill    No refills needed at this time.  . Labs Only    Discuss labs with patients.  . Allergic reaction    Patient is possibly allergic to Lomotil.    HPI: Patient is a  83 y.o. male seen today for follow up for diarrhea. He continues to have diarrhea 3-4 times a day. Stool is mostly loose and watery with some semi-formed stool. No blood in stool. No nausea or vomiting. Appetite is good. Has been having increased dairy intake since his move to [facility]. Diarrhea x about 3 months. Infectious workup negative. Weakness has increased and needs increased assistance with transfer and ADLs. No fall reported. No hypoglycemia reported.   He participates minimally in HPI and ROS. Obtained from wife and caregiver.    Past Medical History:  Diagnosis Date  . Abnormality of gait 06/07/2016  . Anemia    from AVM's. Requiring transfusion  . Aortic stenosis    s/p TAVI July 2012  . Arthritis   . Balance problem    WEAK ON RIGHT SIDE ( SCOLISOSIS AND HX OF A BACK SURGERY) - BALANCE PROBLEM - "FALLS BACKWARD" - FREQUENT FALLS.  USES CANE WHEN AMBULATING  . Cancer (HCC)    BLADDER CANCER  . Chronic kidney disease   . Complication of anesthesia    VERY DROWSY FOR HOURS AFTER SURGERY - AND DISORIENTATION  . Dementia    more memory problems from stroke  . Fall   . GERD (gastroesophageal reflux disease)    RARE - AND NO MEDS  . GI bleed   . Glaucoma   . Glaucoma   . Heart murmur    PT'S CARDIOLOGIST IS DR. Acie Fredrickson -LAST OFFICE VISIT 03/02/13 IN [facility]  . History of blood transfusion LAST 2-3 YRS AGO  HAD 4 OR 5   . History of cerebral infarction 08/19/2013  . Hypercholesteremia   . Hyperlipemia   . Hypertension    Not taking Lisinopril due to BP drops low.  . Mitral regurgitation   . PONV (postoperative nausea and vomiting)    IN PAST, DID WELL WITH LAST SURGERY  . Shingles May 2013   right arm residual pain- last outbreak 1 1/2 yrs ago.  . Stroke ([place])    left internal capsule 1/15  . Transfusion history    last 2 yrs ago.   Past Surgical History:  Procedure Laterality Date  . AORTIC VALVE REPLACEMENT     02/2011([facility])  . APPENDECTOMY    . BACK  SURGERY  2010 OR 2011   LOWER  . CARDIAC CATHETERIZATION  10/23/2010   ef 65%  . CARDIOVASCULAR STRESS TEST  01/01/2007   EF 64%, NO EVIDENCE OF ISCHEMIA  . CYSTOSCOPY W/ RETROGRADES Bilateral 09/17/2013   Procedure: CYSTOSCOPY WITH BILATERAL RETROGRADE;  Surgeon: [name], MD;  Location: [facility];  Service: Urology;  Laterality: Bilateral;  . CYSTOSCOPY W/ RETROGRADES Bilateral 11/15/2015   Procedure: CYSTO, BILATERAL RETROGRADE PYELOGRAM WITH BLADDER BIOPSY;  Surgeon: [name], MD;  Location: [facility];  Service: Urology;  Laterality: Bilateral;  . CYSTOSCOPY WITH STENT PLACEMENT Left 04/13/2013   Procedure:  LEFT URETERAL STENT PLACEMENT;  Surgeon: [name], MD;  Location: [facility];  Service: Urology;  Laterality: Left;  . EYE SURGERY     bilateral cataracts with lens implants  . TRANSURETHRAL RESECTION OF BLADDER TUMOR N/A 06/04/2013   Procedure: RESTAGING TRANSURETHRAL RESECTION OF BLADDER TUMOR (TURBT), URETERAL CATHERIZATION ;  Surgeon: [name], MD;  Location: [facility];  Service: Urology;  Laterality: N/A;  CYSTO RESTAGING TURBT     . TRANSURETHRAL RESECTION OF BLADDER TUMOR N/A 09/17/2013   Procedure: TRANSURETHRAL RESECTION OF BLADDER TUMOR WITH MITOMYCIN-C;  Surgeon: [name], MD;  Location: [facility];  Service: Urology;  Laterality: N/A;  . TRANSURETHRAL RESECTION OF BLADDER TUMOR WITH GYRUS (TURBT-GYRUS) N/A 04/13/2013   Procedure: TRANSURETHRAL RESECTION OF BLADDER TUMOR WITH GYRUS (TURBT-GYRUS);  Surgeon: [name], MD;  Location: [facility];  Service: Urology;  Laterality: N/A;  . US ECHOCARDIOGRAPHY  08/31/10   EF 55-60%    reports that he quit smoking about 35 years ago. His smoking use included Pipe. He has never used smokeless tobacco. He reports that he drinks about 0.6 oz of alcohol per week . He reports that he does not use drugs. Social History   Social History  . Marital status: Married    Spouse name: N/A  . Number of children: 2  . Years of education: College    Occupational History  . retired Hotel manager     Social History Main Topics  . Smoking status: Former Smoker    Types: Pipe    Quit date: 07/04/1981  . Smokeless tobacco: Never Used  . Alcohol use 0.6 oz/week    1 Glasses of wine per week  . Drug use: No  . Sexual activity: Not Currently   Other Topics Concern  . Not on file   Social History Narrative   [facility] 03/13/2016   Patient lives at home with his wife has 2 children.   Married 1960.   Patient is left handed.   Patient has college education.   Patient drink 4 cups daily.   Smoked pipe 20-35 years    Alcohol 1 glass of  wine per day    Exercise no           Family History  Problem Relation Age of Onset  . COPD Father   . Stroke Father   . Parkinson's disease Brother 62  . Heart failure Mother   . Bowel Disease Sister 71    Health Maintenance  Topic Date Due  . INFLUENZA VACCINE  03/06/2017  . PNA vac Low Risk Adult (2 of 2 - PCV13) 08/06/2017 (Originally 03/06/2012)  . TETANUS/TDAP  06/01/2026    Allergies  Allergen Reactions  . Advil [Ibuprofen] Other (See Comments)    Causes stomach bleeding, need transfusions as result  . Aspirin Other (See Comments)    Causes stomach bleeding, needs transfusion as result  . Penicillins Itching and Swelling    Has patient had a PCN reaction causing immediate rash, facial/tongue/throat swelling, SOB or lightheadedness with hypotension: Unsure Has patient had a PCN reaction causing severe rash involving mucus membranes or skin necrosis: Unsure  Has patient had a PCN reaction that required hospitalization Yes Has patient had a PCN reaction occurring within the last 10 years: No If all of the above answers are "NO", then may proceed with Cephalosporin use.  . Aricept [Donepezil]     Listed on face sheet  . Atorvastatin     Joint ache  . Famciclovir     Listed on face sheet  . Namenda [Memantine]     Stomach pain  . Other Other (See Comments)    No  blood thinners due to GI bleed.  . Ativan [Lorazepam] Anxiety    He became very anxious and aggitated    Outpatient Encounter Prescriptions as of 03/13/2017  Medication Sig  . ALPRAZolam (XANAX) 0.5 MG tablet Take 0.5 mg by mouth at bedtime as needed for anxiety. Taking a half a tablet at breakfast and lunch. 1 whole tablet at dinner time  . amLODipine (NORVASC) 5 MG tablet Take 1 tablet (5 mg total) by mouth daily.  . cholecalciferol (VITAMIN D) 1000 units tablet Take 1,000 Units by mouth daily.  . citalopram (CELEXA) 10 MG tablet Take 1 tablet (10 mg total) by mouth daily.  . iron polysaccharides (NIFEREX) 150 MG capsule Take 150 mg by mouth every morning.   . latanoprost (XALATAN) 0.005 % ophthalmic solution Place 1 drop into both eyes at bedtime.  . Multiple Vitamin (MULTIVITAMIN) tablet Take 1 tablet by mouth daily.  Marland Kitchen UNABLE TO FIND Med Name: Tylenol liquid 1 tablespoons 2-3 times to day.  . vitamin B-12 (CYANOCOBALAMIN) 1000 MCG tablet Take 1,000 mcg by mouth daily.  . diphenoxylate-atropine (LOMOTIL) 2.5-0.025 MG tablet start taking one tablet three times a day with meals x 3 days and then twice a day until next visit (Patient not taking: Reported on 03/13/2017)  . [DISCONTINUED] acetaminophen (TYLENOL) 500 MG tablet Take 500 mg by mouth every 6 (six) hours as needed for mild pain, moderate pain or headache.  . [DISCONTINUED] ALPRAZolam (XANAX) 0.5 MG tablet Take 1 tablet (0.5 mg total) by mouth 3 (three) times daily as needed for anxiety (hold for sedation).   No facility-administered encounter medications on file as of 03/13/2017.     Review of Systems  Constitutional: Negative for appetite change, chills, diaphoresis and fever.  HENT: Negative for congestion and mouth sores.   Respiratory: Negative for cough, choking and shortness of breath.   Cardiovascular: Negative for chest pain and palpitations.  Gastrointestinal: Negative for abdominal pain, blood in stool, nausea and  vomiting.   Genitourinary: Negative for dysuria and hematuria.  Musculoskeletal: Positive for gait problem.  Neurological: Positive for weakness. Negative for dizziness and syncope.  Psychiatric/Behavioral: Positive for confusion. The patient is nervous/anxious.     Vitals:   03/13/17 1048  BP: 136/72  Pulse: 90  Resp: 16  Temp: 97.7 F (36.5 C)  TempSrc: Oral  SpO2: 91%  Height: '5\' 7"'$  (1.702 m)   There is no height or weight on file to calculate BMI. Physical Exam  Constitutional: He appears well-developed and well-nourished. No distress.  HENT:  Head: Normocephalic and atraumatic.  Mouth/Throat: Oropharynx is clear and moist.  Eyes: Pupils are equal, round, and reactive to light. Conjunctivae are normal.  Neck: Normal range of motion. Neck supple. No thyromegaly present.  Cardiovascular: Normal rate and regular rhythm.   Pulmonary/Chest: Effort normal and breath sounds normal. No respiratory distress. He has no wheezes.  Abdominal: Soft. Bowel sounds are normal. He exhibits no distension and no mass. There is no tenderness. There is no rebound and no guarding.  Musculoskeletal: He exhibits edema.  Trace leg edema, on wheelchair, can move all 4 extremities  Lymphadenopathy:    He has no cervical adenopathy.  Neurological: He is alert.  Oriented only to self  Skin: Skin is warm and dry. He is not diaphoretic.  Psychiatric: His behavior is normal.  anxious    Labs reviewed: Basic Metabolic Panel:  Recent Labs  12/06/16 1023 12/06/16 1038 02/13/17 1115 03/11/17 0830  NA 141 141 138 140  K 4.7 4.9 3.9 3.4*  CL 110 110 103 103  CO2 22  --  26 25  GLUCOSE 105* 101* 82 84  BUN 22* 30* 13 14  CREATININE 1.57* 1.60* 1.30* 1.24*  CALCIUM 8.9  --  9.0 8.9  MG  --   --   --  2.0   Liver Function Tests:  Recent Labs  12/06/16 1023 02/13/17 1115 03/11/17 0830  AST '26 12 13  '$ ALT 9* 6* 8*  ALKPHOS 61 73 66  BILITOT 1.2 0.5 0.5  PROT 7.3 7.3 7.3  ALBUMIN 3.4* 3.7 3.9    No results for input(s): LIPASE, AMYLASE in the last 8760 hours. No results for input(s): AMMONIA in the last 8760 hours. CBC:  Recent Labs  09/24/16 0001 12/06/16 1023 12/06/16 1038 02/13/17 1115  WBC 5.3 9.7  --  5.1  NEUTROABS 4,187 8.6*  --  3,927  HGB 12.1* 11.1* 10.9* 11.6*  HCT 36.5* 33.6* 32.0* 34.1*  MCV 94.6 94.9  --  93.2  PLT 213 144*  --  216   Cardiac Enzymes: No results for input(s): CKTOTAL, CKMB, CKMBINDEX, TROPONINI in the last 8760 hours. BNP: Invalid input(s): POCBNP Lab Results  Component Value Date   HGBA1C 5.2 08/19/2013   Lab Results  Component Value Date   TSH 2.55 02/13/2017   Lab Results  Component Value Date   VITAMINB12 1,314 (H) 11/17/2013   No results found for: FOLATE No results found for: IRON, TIBC, FERRITIN  Lipid Panel:  Recent Labs  09/24/16 0001  CHOL 167  HDL 46  LDLCALC 98  TRIG 114  CHOLHDL 3.6   Lab Results  Component Value Date   HGBA1C 5.2 08/19/2013    Procedures since last visit: No results found.  Assessment/Plan  1. Diarrhea, unspecified type Did not tolerate lomotil with rash. Wants to switch back to imodium. This is likely from IBS. Advised to see Gi for furhter follow up. D/c lomotil. Continue  imodium as needed. Has history of anxiety and had constipation before. Infectious etiology has been ruled out. Advised on removing dairy product from his diet to assess further after wife mentioned that his dairy intake has increased since arrival to the facility.   - CMP with eGFR; Future - Magnesium; Future  2. Generalized weakness Will provide PT consult. His diarrhea, deconditioning and dementia could all be contributing here. Advised wife about needing higher level of care and need for possible transfer to SNF  3. Goals of care, counseling/discussion Advised wife about needing higher level of care and need for possible transfer to SNF. Wife does not want him transferred to SNF at present. Agrees to look  into it if he does not make improvement with therapy team  4. GAD (generalized anxiety disorder) Stable but nore sleepy per wife. Decrease alprazolam to 0.5 mg 1/2 a tab in am and 1 tab at bedtime. Remove afternoon dosing and monitor  5. Hypokalemia Low k of 3.4 on lab review. Advised to take bananna on daily basis. Will not provide kcl supplement given his difficulty with taking meds. Recheck lab in 1 week   Labs/tests ordered:  bmp, mg  Next appointment: 3 months  Communication: reviewed care plan with pt, his wife and caregiver.    I spent 40 minutes in total face-to-face time with the patient, more than 50% of which was spent in counseling and coordination of care, reviewing test results, reviewing medication and discussing or reviewing the diagnosis with patient and his wife.     Blanchie Serve, MD Internal Medicine [facility] [address] [place], [place] [address] Cell Phone (Monday-Friday 8 am - 5 pm): [phone] On Call: [phone] and follow prompts after 5 pm and on weekends Office Phone: [phone] Office Fax: [phone]

## 2017-03-14 DIAGNOSIS — M6281 Muscle weakness (generalized): Secondary | ICD-10-CM | POA: Diagnosis not present

## 2017-03-14 DIAGNOSIS — Z952 Presence of prosthetic heart valve: Secondary | ICD-10-CM | POA: Diagnosis not present

## 2017-03-14 DIAGNOSIS — R296 Repeated falls: Secondary | ICD-10-CM | POA: Diagnosis not present

## 2017-03-14 DIAGNOSIS — G468 Other vascular syndromes of brain in cerebrovascular diseases: Secondary | ICD-10-CM | POA: Diagnosis not present

## 2017-03-14 DIAGNOSIS — E638 Other specified nutritional deficiencies: Secondary | ICD-10-CM | POA: Diagnosis not present

## 2017-03-14 DIAGNOSIS — C679 Malignant neoplasm of bladder, unspecified: Secondary | ICD-10-CM | POA: Diagnosis not present

## 2017-03-14 DIAGNOSIS — N401 Enlarged prostate with lower urinary tract symptoms: Secondary | ICD-10-CM | POA: Diagnosis not present

## 2017-03-14 DIAGNOSIS — E7800 Pure hypercholesterolemia, unspecified: Secondary | ICD-10-CM | POA: Diagnosis not present

## 2017-03-14 DIAGNOSIS — F0390 Unspecified dementia without behavioral disturbance: Secondary | ICD-10-CM | POA: Diagnosis not present

## 2017-03-14 DIAGNOSIS — N184 Chronic kidney disease, stage 4 (severe): Secondary | ICD-10-CM | POA: Diagnosis not present

## 2017-03-14 DIAGNOSIS — R262 Difficulty in walking, not elsewhere classified: Secondary | ICD-10-CM | POA: Diagnosis not present

## 2017-03-14 DIAGNOSIS — R2681 Unsteadiness on feet: Secondary | ICD-10-CM | POA: Diagnosis not present

## 2017-03-14 DIAGNOSIS — D509 Iron deficiency anemia, unspecified: Secondary | ICD-10-CM | POA: Diagnosis not present

## 2017-03-14 DIAGNOSIS — R42 Dizziness and giddiness: Secondary | ICD-10-CM | POA: Diagnosis not present

## 2017-03-14 DIAGNOSIS — I10 Essential (primary) hypertension: Secondary | ICD-10-CM | POA: Diagnosis not present

## 2017-03-16 ENCOUNTER — Other Ambulatory Visit: Payer: Self-pay | Admitting: Cardiovascular Disease

## 2017-03-18 ENCOUNTER — Encounter (HOSPITAL_COMMUNITY): Payer: Self-pay

## 2017-03-18 ENCOUNTER — Emergency Department (HOSPITAL_COMMUNITY): Payer: Medicare Other

## 2017-03-18 ENCOUNTER — Emergency Department (HOSPITAL_COMMUNITY)
Admission: EM | Admit: 2017-03-18 | Discharge: 2017-03-18 | Disposition: A | Discharge status: Home / Self Care | Payer: Medicare Other | Attending: Emergency Medicine | Admitting: Emergency Medicine

## 2017-03-18 DIAGNOSIS — N182 Chronic kidney disease, stage 2 (mild): Secondary | ICD-10-CM | POA: Diagnosis not present

## 2017-03-18 DIAGNOSIS — R4182 Altered mental status, unspecified: Secondary | ICD-10-CM | POA: Diagnosis not present

## 2017-03-18 DIAGNOSIS — I129 Hypertensive chronic kidney disease with stage 1 through stage 4 chronic kidney disease, or unspecified chronic kidney disease: Secondary | ICD-10-CM | POA: Insufficient documentation

## 2017-03-18 DIAGNOSIS — R5383 Other fatigue: Secondary | ICD-10-CM | POA: Insufficient documentation

## 2017-03-18 DIAGNOSIS — Z87891 Personal history of nicotine dependence: Secondary | ICD-10-CM | POA: Diagnosis not present

## 2017-03-18 DIAGNOSIS — Z8551 Personal history of malignant neoplasm of bladder: Secondary | ICD-10-CM | POA: Insufficient documentation

## 2017-03-18 DIAGNOSIS — Z880 Allergy status to penicillin: Secondary | ICD-10-CM | POA: Diagnosis not present

## 2017-03-18 DIAGNOSIS — R402441 Other coma, without documented Glasgow coma scale score, or with partial score reported, in the field [EMT or ambulance]: Secondary | ICD-10-CM | POA: Diagnosis not present

## 2017-03-18 DIAGNOSIS — F0390 Unspecified dementia without behavioral disturbance: Secondary | ICD-10-CM | POA: Diagnosis not present

## 2017-03-18 DIAGNOSIS — Z79899 Other long term (current) drug therapy: Secondary | ICD-10-CM | POA: Diagnosis not present

## 2017-03-18 DIAGNOSIS — Z8673 Personal history of transient ischemic attack (TIA), and cerebral infarction without residual deficits: Secondary | ICD-10-CM | POA: Insufficient documentation

## 2017-03-18 LAB — DIFFERENTIAL
BASOS ABS: 0 10*3/uL (ref 0.0–0.1)
BASOS PCT: 0 %
EOS ABS: 0 10*3/uL (ref 0.0–0.7)
Eosinophils Relative: 1 %
LYMPHS ABS: 0.6 10*3/uL — AB (ref 0.7–4.0)
Lymphocytes Relative: 10 %
Monocytes Absolute: 0.5 10*3/uL (ref 0.1–1.0)
Monocytes Relative: 8 %
NEUTROS ABS: 5 10*3/uL (ref 1.7–7.7)
NEUTROS PCT: 81 %

## 2017-03-18 LAB — URINALYSIS, ROUTINE W REFLEX MICROSCOPIC
Bacteria, UA: NONE SEEN
Bilirubin Urine: NEGATIVE
Glucose, UA: NEGATIVE mg/dL
Hgb urine dipstick: NEGATIVE
Ketones, ur: NEGATIVE mg/dL
Leukocytes, UA: NEGATIVE
Nitrite: NEGATIVE
PH: 6 (ref 5.0–8.0)
Protein, ur: 30 mg/dL — AB
SPECIFIC GRAVITY, URINE: 1.016 (ref 1.005–1.030)
SQUAMOUS EPITHELIAL / LPF: NONE SEEN

## 2017-03-18 LAB — COMPREHENSIVE METABOLIC PANEL
ALBUMIN: 3.4 g/dL — AB (ref 3.5–5.0)
ALT: 5 U/L — AB (ref 17–63)
AST: 22 U/L (ref 15–41)
Alkaline Phosphatase: 59 U/L (ref 38–126)
Anion gap: 7 (ref 5–15)
BILIRUBIN TOTAL: 0.7 mg/dL (ref 0.3–1.2)
BUN: 15 mg/dL (ref 6–20)
CHLORIDE: 105 mmol/L (ref 101–111)
CO2: 27 mmol/L (ref 22–32)
CREATININE: 1.33 mg/dL — AB (ref 0.61–1.24)
Calcium: 8.8 mg/dL — ABNORMAL LOW (ref 8.9–10.3)
GFR calc Af Amer: 51 mL/min — ABNORMAL LOW (ref >60)
GFR calc non Af Amer: 44 mL/min — ABNORMAL LOW (ref >60)
GLUCOSE: 93 mg/dL (ref 65–99)
POTASSIUM: 3.8 mmol/L (ref 3.5–5.1)
Sodium: 139 mmol/L (ref 135–145)
Total Protein: 7.4 g/dL (ref 6.5–8.1)

## 2017-03-18 LAB — RAPID URINE DRUG SCREEN, HOSP PERFORMED
Amphetamines: NOT DETECTED
BARBITURATES: NOT DETECTED
BENZODIAZEPINES: POSITIVE — AB
COCAINE: NOT DETECTED
Opiates: NOT DETECTED
TETRAHYDROCANNABINOL: NOT DETECTED

## 2017-03-18 LAB — I-STAT TROPONIN, ED: Troponin i, poc: 0.01 ng/mL (ref 0.00–0.08)

## 2017-03-18 LAB — CBC
HEMATOCRIT: 34.9 % — AB (ref 39.0–52.0)
HEMOGLOBIN: 11.8 g/dL — AB (ref 13.0–17.0)
MCH: 31.1 pg (ref 26.0–34.0)
MCHC: 33.8 g/dL (ref 30.0–36.0)
MCV: 91.8 fL (ref 78.0–100.0)
Platelets: 180 10*3/uL (ref 150–400)
RBC: 3.8 MIL/uL — ABNORMAL LOW (ref 4.22–5.81)
RDW: 14.2 % (ref 11.5–15.5)
WBC: 6.1 10*3/uL (ref 4.0–10.5)

## 2017-03-18 LAB — ETHANOL

## 2017-03-18 NOTE — ED Notes (Signed)
Family at bedside. 

## 2017-03-18 NOTE — ED Provider Notes (Signed)
[facility] DEPT Provider Note   CSN: [id number] Arrival date & time: 03/18/17  1212     History   Chief Complaint Chief Complaint  Patient presents with  . Lethargic    HPI Bryan Moore is a 83 y.o. male.  HPI Pt presents to the ED for lethargy.  Pt lives at an assisted living facility.  Family went to see him today.  They noticed that he seemed to be waxing and waning in his alertness.  He has dementia but he was more lethargic than usual.  Pt denies any complaints.  When asked how he feels, he answers, "Well,  I don't know." Past Medical History:  Diagnosis Date  . Abnormality of gait 06/07/2016  . Anemia    from AVM's. Requiring transfusion  . Aortic stenosis    s/p TAVI July 2012  . Arthritis   . Balance problem    WEAK ON RIGHT SIDE ( SCOLISOSIS AND HX OF A BACK SURGERY) - BALANCE PROBLEM - "FALLS BACKWARD" - FREQUENT FALLS.  USES CANE WHEN AMBULATING  . Cancer (HCC)    BLADDER CANCER  . Chronic kidney disease   . Complication of anesthesia    VERY DROWSY FOR HOURS AFTER SURGERY - AND DISORIENTATION  . Dementia    more memory problems from stroke  . Fall   . GERD (gastroesophageal reflux disease)    RARE - AND NO MEDS  . GI bleed   . Glaucoma   . Glaucoma   . Heart murmur    PT'S CARDIOLOGIST IS DR. Acie Fredrickson -LAST OFFICE VISIT 03/02/13 IN [facility]  . History of blood transfusion LAST 2-3 YRS AGO   HAD 4 OR 5   . History of cerebral infarction 08/19/2013  . Hypercholesteremia   . Hyperlipemia   . Hypertension    Not taking Lisinopril due to BP drops low.  . Mitral regurgitation   . PONV (postoperative nausea and vomiting)    IN PAST, DID WELL WITH LAST SURGERY  . Shingles May 2013   right arm residual pain- last outbreak 1 1/2 yrs ago.  . Stroke ([place])    left internal capsule 1/15  . Transfusion history    last 2 yrs ago.    Patient Active Problem List   Diagnosis Date Noted  . CKD (chronic kidney disease) stage 2, GFR 60-89 ml/min 12/04/2016  .  Abnormality of gait 2020/10/2915  . Delusional disorder ([place]) 12/19/2015  . Hypervascular lesion of urinary bladder 11/16/2015  . Hx of bladder cancer 11/15/2015  . Alzheimer's disease 02/24/2014  . Greater trochanteric bursitis of right hip 02/08/2014  . Lacunar infarct, acute ([place]) 11/12/2013  . Gastric AVM 09/18/2013  . History of cerebral infarction 08/19/2013  . Bladder cancer ([place]) 04/15/2013  . SunDown syndrome 04/15/2013  . Frequent falls 07/17/2012  . Atypical chest pain 12/10/2011  . GI bleeding 08/16/2011  . Aortic stenosis 08/15/2011  . Aortic valve disease 03/28/2011  . Hypertension 03/28/2011  . Hyperlipidemia 03/28/2011  . Anemia 03/28/2011    Past Surgical History:  Procedure Laterality Date  . AORTIC VALVE REPLACEMENT     02/2011([facility])  . APPENDECTOMY    . BACK SURGERY  2010 OR 2011   LOWER  . CARDIAC CATHETERIZATION  10/23/2010   ef 65%  . CARDIOVASCULAR STRESS TEST  01/01/2007   EF 64%, NO EVIDENCE OF ISCHEMIA  . CYSTOSCOPY W/ RETROGRADES Bilateral 09/17/2013   Procedure: CYSTOSCOPY WITH BILATERAL RETROGRADE;  Surgeon: [name], MD;  Location: [facility];  Service: Urology;  Laterality: Bilateral;  . CYSTOSCOPY W/ RETROGRADES Bilateral 11/15/2015   Procedure: CYSTO, BILATERAL RETROGRADE PYELOGRAM WITH BLADDER BIOPSY;  Surgeon: [name], MD;  Location: [facility];  Service: Urology;  Laterality: Bilateral;  . CYSTOSCOPY WITH STENT PLACEMENT Left 04/13/2013   Procedure:  LEFT URETERAL STENT PLACEMENT;  Surgeon: [name], MD;  Location: [facility];  Service: Urology;  Laterality: Left;  . EYE SURGERY     bilateral cataracts with lens implants  . TRANSURETHRAL RESECTION OF BLADDER TUMOR N/A 06/04/2013   Procedure: RESTAGING TRANSURETHRAL RESECTION OF BLADDER TUMOR (TURBT), URETERAL CATHERIZATION ;  Surgeon: [name], MD;  Location: [facility];  Service: Urology;  Laterality: N/A;  CYSTO RESTAGING TURBT     . TRANSURETHRAL RESECTION OF BLADDER TUMOR N/A 09/17/2013    Procedure: TRANSURETHRAL RESECTION OF BLADDER TUMOR WITH MITOMYCIN-C;  Surgeon: [name], MD;  Location: [facility];  Service: Urology;  Laterality: N/A;  . TRANSURETHRAL RESECTION OF BLADDER TUMOR WITH GYRUS (TURBT-GYRUS) N/A 04/13/2013   Procedure: TRANSURETHRAL RESECTION OF BLADDER TUMOR WITH GYRUS (TURBT-GYRUS);  Surgeon: [name], MD;  Location: [facility];  Service: Urology;  Laterality: N/A;  . US ECHOCARDIOGRAPHY  08/31/10   EF 55-60%       Home Medications    Prior to Admission medications   Medication Sig Start Date End Date Taking? Authorizing Provider  ALPRAZolam Duanne Moron) 0.5 MG tablet Take 0.5 mg by mouth 2 (two) times daily. Taking a half a tablet at breakfast  and 1 whole tablet at dinner time     [provider]  amLODipine (NORVASC) 5 MG tablet Take 1 tablet (5 mg total) by mouth daily. 02/14/17   Nahser, [name], MD  cholecalciferol (VITAMIN D) 1000 units tablet Take 1,000 Units by mouth daily.    [provider]  citalopram (CELEXA) 10 MG tablet Take 1 tablet (10 mg total) by mouth daily. 12/18/16   Kathrynn Ducking, MD  iron polysaccharides (NIFEREX) 150 MG capsule Take 150 mg by mouth every morning.     [provider]  latanoprost (XALATAN) 0.005 % ophthalmic solution Place 1 drop into both eyes at bedtime. 10/11/15   [provider]  loperamide (IMODIUM A-D) 2 MG tablet Take 1 tablet (2 mg total) by mouth 4 (four) times daily as needed for diarrhea or loose stools. 03/13/17   Blanchie Serve, MD  Multiple Vitamin (MULTIVITAMIN) tablet Take 1 tablet by mouth daily.    [provider]  UNABLE TO FIND Med Name: Tylenol liquid 1 tablespoons 2-3 times to day.    [provider]  vitamin B-12 (CYANOCOBALAMIN) 1000 MCG tablet Take 1,000 mcg by mouth daily.    [provider]    Family History Family History  Problem Relation Age of Onset  . COPD Father   . Stroke Father   . Parkinson's disease Brother 51  . Heart  failure Mother   . Bowel Disease Sister 21    Social History Social History  Substance Use Topics  . Smoking status: Former Smoker    Types: Pipe    Quit date: 07/04/1981  . Smokeless tobacco: Never Used  . Alcohol use 0.6 oz/week    1 Glasses of wine per week     Allergies   Advil [ibuprofen]; Aspirin; Penicillins; Aricept [donepezil]; Atorvastatin; Famciclovir; Namenda [memantine]; Other; and Ativan [lorazepam]   Review of Systems Review of Systems  All other systems reviewed and are negative.    Physical  Exam Updated Vital Signs BP 133/73   Pulse 80   Temp (!) 97.5 F (36.4 C) (Oral)   Resp 19   Wt 68.9 kg (152 lb)   SpO2 100%   BMI 23.81 kg/m   Physical Exam  Constitutional: No distress.  Elderly frail   HENT:  Head: Normocephalic and atraumatic.  Right Ear: External ear normal.  Left Ear: External ear normal.  Eyes: Conjunctivae are normal. Right eye exhibits no discharge. Left eye exhibits no discharge. No scleral icterus.  Neck: Neck supple. No tracheal deviation present.  Cardiovascular: Normal rate, regular rhythm and intact distal pulses.   Pulmonary/Chest: Effort normal and breath sounds normal. No stridor. No respiratory distress. He has no wheezes. He has no rales.  Abdominal: Soft. Bowel sounds are normal. He exhibits no distension. There is no tenderness. There is no rebound and no guarding.  Musculoskeletal: He exhibits no edema or tenderness.  Neurological: He is alert. He displays tremor. No cranial nerve deficit (no facial droop, extraocular movements intact, no slurred speech) or sensory deficit. He exhibits normal muscle tone. He displays no seizure activity. Coordination normal.  Follows commands, able to move all extremities and lift arms off the bed, mild tremor; generalized weakness  Skin: Skin is warm and dry. No rash noted.  Psychiatric: He has a normal mood and affect.  Nursing note and vitals reviewed.    ED Treatments / Results   Labs (all labs ordered are listed, but only abnormal results are displayed) Labs Reviewed  CBC - Abnormal; Notable for the following:       Result Value   RBC 3.80 (*)    Hemoglobin 11.8 (*)    HCT 34.9 (*)    All other components within normal limits  DIFFERENTIAL - Abnormal; Notable for the following:    Lymphs Abs 0.6 (*)    All other components within normal limits  COMPREHENSIVE METABOLIC PANEL - Abnormal; Notable for the following:    Creatinine, Ser 1.33 (*)    Calcium 8.8 (*)    Albumin 3.4 (*)    ALT 5 (*)    GFR calc non Af Amer 44 (*)    GFR calc Af Amer 51 (*)    All other components within normal limits  RAPID URINE DRUG SCREEN, HOSP PERFORMED - Abnormal; Notable for the following:    Benzodiazepines POSITIVE (*)    All other components within normal limits  URINALYSIS, ROUTINE W REFLEX MICROSCOPIC - Abnormal; Notable for the following:    Protein, ur 30 (*)    All other components within normal limits  ETHANOL  I-STAT TROPONIN, ED    EKG  EKG Interpretation  Date/Time:  Monday March 18 2017 12:15:31 EDT Ventricular Rate:  74 PR Interval:    QRS Duration: 141 QT Interval:  447 QTC Calculation: 496 R Axis:   56 Text Interpretation:  Ectopic atrial rhythm Multiple premature complexes, vent & supraven IVCD, consider atypical LBBB No significant change since last tracing Confirmed by Dorie Rank [phone]) on 03/18/2017 12:21:03 PM       Radiology Ct Head Wo Contrast  Result Date: 03/18/2017 CLINICAL DATA:  Worsening lethargy. EXAM: CT HEAD WITHOUT CONTRAST TECHNIQUE: Contiguous axial images were obtained from the base of the skull through the vertex without intravenous contrast. COMPARISON:  12/06/2016 FINDINGS: Brain: There is no evidence for acute hemorrhage, hydrocephalus, mass lesion, or abnormal extra-axial fluid collection. No definite CT evidence for acute infarction. Diffuse loss of parenchymal volume is consistent with  atrophy. Patchy low attenuation  in the deep hemispheric and periventricular white matter is nonspecific, but likely reflects chronic microvascular ischemic demyelination. Lacunar infarct left basal ganglia unchanged. Vascular: Choose 1 Skull: No evidence for fracture. No worrisome lytic or sclerotic lesion. Sinuses/Orbits: The visualized paranasal sinuses and mastoid air cells are clear. Visualized portions of the globes and intraorbital fat are unremarkable. Other: None. IMPRESSION: 1. Stable exam.  No acute intracranial abnormality. 2. Atrophy with chronic small vessel white matter ischemic disease. Electronically Signed   By: Misty Stanley M.D.   On: 03/18/2017 13:36    Procedures Procedures (including critical care time)  Medications Ordered in ED Medications - No data to display   Initial Impression / Assessment and Plan / ED Course  I have reviewed the triage vital signs and the nursing notes.  Pertinent labs & imaging results that were available during my care of the patient were reviewed by me and considered in my medical decision making (see chart for details).  Clinical Course as of Mar 19 1551  Mon Mar 18, 2017  1459 Discussed findings with wife  Pt is back to his baseline.    [EG]  3151 Pt is back at his baseline.  He is awake and alert.    [JK]    Clinical Course User Index [JK] Dorie Rank, MD    Patient presented to the emergency room for evaluation of lethargy.  The patient's ED evaluation is reassuring. No acute findings noted on the CT scan. Laboratory tests and vital signs are normal.  It is possible the patient may have had a small TIA however he has no focal neurologic deficits and he has returned to baseline. I do not think further evaluation is warranted at this time considering his age.  It is certain it possible that his symptoms may be related to his dementia.  At this time there does not appear to be any evidence of an acute emergency medical condition and the patient appears stable for discharge  with appropriate outpatient follow up.   Final Clinical Impressions(s) / ED Diagnoses   Final diagnoses:  Lethargy    New Prescriptions New Prescriptions   No medications on file     Dorie Rank, MD 03/18/17 [phone]

## 2017-03-18 NOTE — Discharge Instructions (Signed)
Continue your current medications, follow-up with your primary care doctor later this week to be rechecked

## 2017-03-18 NOTE — ED Notes (Signed)
Lab reports blue top clotted, needs to be recollected.

## 2017-03-18 NOTE — ED Triage Notes (Signed)
Pt presents to the ed with ems from [facility] an assisted living facility. Per the family, they went to see him this morning and he was going in and out of "being lethargic". On arrival to the ed the patient is alert and at baseline with dementia. Family says it is normal for him to not speak a lot and be a little confused.

## 2017-03-19 ENCOUNTER — Telehealth: Payer: Self-pay | Admitting: Cardiovascular Disease

## 2017-03-19 ENCOUNTER — Encounter: Payer: Medicare Other | Admitting: Internal Medicine

## 2017-03-19 MED ORDER — AMLODIPINE BESYLATE 5 MG PO TABS: 5.0000 mg | ORAL_TABLET | Freq: Every day | ORAL | 2 refills | Status: DC

## 2017-03-19 NOTE — Telephone Encounter (Signed)
Pt's medication was sent to pt's pharmacy as requested. Confirmation received.  °

## 2017-03-19 NOTE — Telephone Encounter (Signed)
New message     *STAT* If patient is at the pharmacy, call can be transferred to refill team.   1. Which medications need to be refilled? (please list name of each medication and dose if known) amlodipine   2. Which pharmacy/location (including street and city if local pharmacy) is medication to be sent to? [facility] on [address]  3. Do they need a 30 day or 90 day supply? 30 day

## 2017-03-20 ENCOUNTER — Encounter: Payer: Medicare Other | Admitting: Internal Medicine

## 2017-03-20 DIAGNOSIS — R197 Diarrhea, unspecified: Secondary | ICD-10-CM | POA: Diagnosis not present

## 2017-03-21 ENCOUNTER — Encounter: Payer: Medicare Other | Admitting: Internal Medicine

## 2017-03-21 DIAGNOSIS — G468 Other vascular syndromes of brain in cerebrovascular diseases: Secondary | ICD-10-CM | POA: Diagnosis not present

## 2017-03-21 DIAGNOSIS — R296 Repeated falls: Secondary | ICD-10-CM | POA: Diagnosis not present

## 2017-03-21 DIAGNOSIS — M6281 Muscle weakness (generalized): Secondary | ICD-10-CM | POA: Diagnosis not present

## 2017-03-21 DIAGNOSIS — R262 Difficulty in walking, not elsewhere classified: Secondary | ICD-10-CM | POA: Diagnosis not present

## 2017-03-21 DIAGNOSIS — R2681 Unsteadiness on feet: Secondary | ICD-10-CM | POA: Diagnosis not present

## 2017-03-21 DIAGNOSIS — Z952 Presence of prosthetic heart valve: Secondary | ICD-10-CM | POA: Diagnosis not present

## 2017-03-21 LAB — COMPREHENSIVE METABOLIC PANEL
ALBUMIN: 3.5 g/dL — AB (ref 3.6–5.1)
ALK PHOS: 63 U/L (ref 40–115)
ALT: 4 U/L — AB (ref 9–46)
AST: 10 U/L (ref 10–35)
BILIRUBIN TOTAL: 0.5 mg/dL (ref 0.2–1.2)
BUN: 16 mg/dL (ref 7–25)
CALCIUM: 8.8 mg/dL (ref 8.6–10.3)
CO2: 24 mmol/L (ref 20–32)
CREATININE: 1.28 mg/dL — AB (ref 0.70–1.11)
Chloride: 106 mmol/L (ref 98–110)
GLUCOSE: 83 mg/dL (ref 65–99)
Potassium: 3.4 mmol/L — ABNORMAL LOW (ref 3.5–5.3)
SODIUM: 142 mmol/L (ref 135–146)
Total Protein: 6.6 g/dL (ref 6.1–8.1)

## 2017-03-21 LAB — MAGNESIUM: Magnesium: 2 mg/dL (ref 1.5–2.5)

## 2017-03-22 DIAGNOSIS — M6281 Muscle weakness (generalized): Secondary | ICD-10-CM | POA: Diagnosis not present

## 2017-03-22 DIAGNOSIS — R2681 Unsteadiness on feet: Secondary | ICD-10-CM | POA: Diagnosis not present

## 2017-03-22 DIAGNOSIS — Z952 Presence of prosthetic heart valve: Secondary | ICD-10-CM | POA: Diagnosis not present

## 2017-03-22 DIAGNOSIS — G468 Other vascular syndromes of brain in cerebrovascular diseases: Secondary | ICD-10-CM | POA: Diagnosis not present

## 2017-03-22 DIAGNOSIS — R262 Difficulty in walking, not elsewhere classified: Secondary | ICD-10-CM | POA: Diagnosis not present

## 2017-03-22 DIAGNOSIS — R296 Repeated falls: Secondary | ICD-10-CM | POA: Diagnosis not present

## 2017-03-26 ENCOUNTER — Encounter: Payer: Medicare Other | Admitting: Internal Medicine

## 2017-03-26 DIAGNOSIS — R296 Repeated falls: Secondary | ICD-10-CM | POA: Diagnosis not present

## 2017-03-26 DIAGNOSIS — G468 Other vascular syndromes of brain in cerebrovascular diseases: Secondary | ICD-10-CM | POA: Diagnosis not present

## 2017-03-26 DIAGNOSIS — R2681 Unsteadiness on feet: Secondary | ICD-10-CM | POA: Diagnosis not present

## 2017-03-26 DIAGNOSIS — M6281 Muscle weakness (generalized): Secondary | ICD-10-CM | POA: Diagnosis not present

## 2017-03-26 DIAGNOSIS — R262 Difficulty in walking, not elsewhere classified: Secondary | ICD-10-CM | POA: Diagnosis not present

## 2017-03-26 DIAGNOSIS — Z952 Presence of prosthetic heart valve: Secondary | ICD-10-CM | POA: Diagnosis not present

## 2017-03-28 DIAGNOSIS — R2681 Unsteadiness on feet: Secondary | ICD-10-CM | POA: Diagnosis not present

## 2017-03-28 DIAGNOSIS — M6281 Muscle weakness (generalized): Secondary | ICD-10-CM | POA: Diagnosis not present

## 2017-03-28 DIAGNOSIS — R296 Repeated falls: Secondary | ICD-10-CM | POA: Diagnosis not present

## 2017-03-28 DIAGNOSIS — R262 Difficulty in walking, not elsewhere classified: Secondary | ICD-10-CM | POA: Diagnosis not present

## 2017-03-28 DIAGNOSIS — G468 Other vascular syndromes of brain in cerebrovascular diseases: Secondary | ICD-10-CM | POA: Diagnosis not present

## 2017-03-28 DIAGNOSIS — Z952 Presence of prosthetic heart valve: Secondary | ICD-10-CM | POA: Diagnosis not present

## 2017-04-02 DIAGNOSIS — R296 Repeated falls: Secondary | ICD-10-CM | POA: Diagnosis not present

## 2017-04-02 DIAGNOSIS — R2681 Unsteadiness on feet: Secondary | ICD-10-CM | POA: Diagnosis not present

## 2017-04-02 DIAGNOSIS — R262 Difficulty in walking, not elsewhere classified: Secondary | ICD-10-CM | POA: Diagnosis not present

## 2017-04-02 DIAGNOSIS — M6281 Muscle weakness (generalized): Secondary | ICD-10-CM | POA: Diagnosis not present

## 2017-04-02 DIAGNOSIS — G468 Other vascular syndromes of brain in cerebrovascular diseases: Secondary | ICD-10-CM | POA: Diagnosis not present

## 2017-04-02 DIAGNOSIS — Z952 Presence of prosthetic heart valve: Secondary | ICD-10-CM | POA: Diagnosis not present

## 2017-04-04 DIAGNOSIS — R2681 Unsteadiness on feet: Secondary | ICD-10-CM | POA: Diagnosis not present

## 2017-04-04 DIAGNOSIS — R296 Repeated falls: Secondary | ICD-10-CM | POA: Diagnosis not present

## 2017-04-04 DIAGNOSIS — Z952 Presence of prosthetic heart valve: Secondary | ICD-10-CM | POA: Diagnosis not present

## 2017-04-04 DIAGNOSIS — R262 Difficulty in walking, not elsewhere classified: Secondary | ICD-10-CM | POA: Diagnosis not present

## 2017-04-04 DIAGNOSIS — M6281 Muscle weakness (generalized): Secondary | ICD-10-CM | POA: Diagnosis not present

## 2017-04-04 DIAGNOSIS — G468 Other vascular syndromes of brain in cerebrovascular diseases: Secondary | ICD-10-CM | POA: Diagnosis not present

## 2017-04-05 ENCOUNTER — Encounter: Payer: Self-pay | Admitting: Internal Medicine

## 2017-04-09 DIAGNOSIS — C679 Malignant neoplasm of bladder, unspecified: Secondary | ICD-10-CM | POA: Diagnosis not present

## 2017-04-09 DIAGNOSIS — N401 Enlarged prostate with lower urinary tract symptoms: Secondary | ICD-10-CM | POA: Diagnosis not present

## 2017-04-09 DIAGNOSIS — D509 Iron deficiency anemia, unspecified: Secondary | ICD-10-CM | POA: Diagnosis not present

## 2017-04-09 DIAGNOSIS — I10 Essential (primary) hypertension: Secondary | ICD-10-CM | POA: Diagnosis not present

## 2017-04-09 DIAGNOSIS — E638 Other specified nutritional deficiencies: Secondary | ICD-10-CM | POA: Diagnosis not present

## 2017-04-09 DIAGNOSIS — Z952 Presence of prosthetic heart valve: Secondary | ICD-10-CM | POA: Diagnosis not present

## 2017-04-09 DIAGNOSIS — R2681 Unsteadiness on feet: Secondary | ICD-10-CM | POA: Diagnosis not present

## 2017-04-09 DIAGNOSIS — R42 Dizziness and giddiness: Secondary | ICD-10-CM | POA: Diagnosis not present

## 2017-04-09 DIAGNOSIS — N184 Chronic kidney disease, stage 4 (severe): Secondary | ICD-10-CM | POA: Diagnosis not present

## 2017-04-09 DIAGNOSIS — M6281 Muscle weakness (generalized): Secondary | ICD-10-CM | POA: Diagnosis not present

## 2017-04-09 DIAGNOSIS — R262 Difficulty in walking, not elsewhere classified: Secondary | ICD-10-CM | POA: Diagnosis not present

## 2017-04-09 DIAGNOSIS — G468 Other vascular syndromes of brain in cerebrovascular diseases: Secondary | ICD-10-CM | POA: Diagnosis not present

## 2017-04-09 DIAGNOSIS — F0390 Unspecified dementia without behavioral disturbance: Secondary | ICD-10-CM | POA: Diagnosis not present

## 2017-04-09 DIAGNOSIS — E7800 Pure hypercholesterolemia, unspecified: Secondary | ICD-10-CM | POA: Diagnosis not present

## 2017-04-09 DIAGNOSIS — R296 Repeated falls: Secondary | ICD-10-CM | POA: Diagnosis not present

## 2017-04-11 ENCOUNTER — Non-Acute Institutional Stay: Payer: Medicare Other | Admitting: Family

## 2017-04-11 ENCOUNTER — Encounter: Payer: Self-pay | Admitting: Family

## 2017-04-11 VITALS — BP 130/70 | HR 91 | Temp 97.7°F | Resp 14 | Ht 67.0 in | Wt 148.0 lb

## 2017-04-11 DIAGNOSIS — R296 Repeated falls: Secondary | ICD-10-CM | POA: Diagnosis not present

## 2017-04-11 DIAGNOSIS — G468 Other vascular syndromes of brain in cerebrovascular diseases: Secondary | ICD-10-CM | POA: Diagnosis not present

## 2017-04-11 DIAGNOSIS — R197 Diarrhea, unspecified: Secondary | ICD-10-CM

## 2017-04-11 DIAGNOSIS — N182 Chronic kidney disease, stage 2 (mild): Secondary | ICD-10-CM | POA: Diagnosis not present

## 2017-04-11 DIAGNOSIS — I1310 Hypertensive heart and chronic kidney disease without heart failure, with stage 1 through stage 4 chronic kidney disease, or unspecified chronic kidney disease: Secondary | ICD-10-CM | POA: Diagnosis not present

## 2017-04-11 DIAGNOSIS — R2681 Unsteadiness on feet: Secondary | ICD-10-CM | POA: Diagnosis not present

## 2017-04-11 DIAGNOSIS — M6281 Muscle weakness (generalized): Secondary | ICD-10-CM | POA: Diagnosis not present

## 2017-04-11 DIAGNOSIS — R262 Difficulty in walking, not elsewhere classified: Secondary | ICD-10-CM | POA: Diagnosis not present

## 2017-04-11 DIAGNOSIS — Z952 Presence of prosthetic heart valve: Secondary | ICD-10-CM | POA: Diagnosis not present

## 2017-04-11 MED ORDER — AMLODIPINE BESYLATE 5 MG PO TABS: 5.0000 mg | ORAL_TABLET | Freq: Every day | ORAL | 3 refills | Status: DC

## 2017-04-11 NOTE — Progress Notes (Signed)
Location:  [place] of Service:  Clinic (12) Provider: Navjot Loera FNP-C  Blanchie Serve, MD  Patient Care Team: Blanchie Serve, MD as PCP - General (Internal Medicine) Ronald Lobo, MD (Gastroenterology) Rolm Bookbinder, MD as Consulting Physician (Dermatology) Nahser, [name], MD as Consulting Physician (Cardiology) Kathrynn Ducking, MD as Consulting Physician (Neurology) [name], MD as Attending Physician (Urology) Luberta Mutter, MD as Consulting Physician (Ophthalmology) Blanchie Serve, MD as Consulting Physician (Internal Medicine)  Extended Emergency Contact Information Primary Emergency Contact: [facility] Address: [address]          [place], [place] [address] [name] of [place] Phone: [phone] Mobile Phone: [phone] Relation: Spouse Secondary Emergency Contact: [place] , [place] Phone: [phone] Mobile Phone: [phone] Relation: Son  Code Status:  DNR Goals of care: Advanced Directive information Advanced Directives 04/11/2017  Does Patient Have a Medical Advance Directive? Yes  Type of Advance Directive Out of facility DNR (pink MOST or yellow form);Healthcare Power of Attorney  Does patient want to make changes to medical advance directive? -  Copy of [place] in Chart? Yes  Pre-existing out of facility DNR order (yellow form or pink MOST form) Yellow form placed in chart (order not valid for inpatient use)     Chief Complaint  Patient presents with  . Acute Visit    Follow up (diarrhea)    HPI:  Pt is a 83 y.o. male seen today at [facility] for an acute visit for evaluation of diarrhea and follow ED visit. He has a medical history of HTN,CKD stage 2, Alzheimer's disease, Aortic stenosis, bladder Cancer,Depression among other conditions. He is seen in the clinic today escorted by wife and care giver.He is  unable to provide HPI and ROS due to his cognitive impairment. Information provided by patient's wife who states patient continues to have loose stool and watery at times. She has stopped giving imodium due to patient breaking out with a red rash on top of the thigh though states no rash today. She further states patient was seen by GI no colonoscopy was recommended due to advance age and Alzheimer's disease.also states he was given some samples of a new medication for IBS but " it didn't do no good' so she stopped giving him the medication. She states planning to take patient back to GI for reevaluation.He was seen in the ED 03/18/2017 for lethargy.It was thought patient might have had a small TIA but no neurological deficits was noted. No acute findings on the CT scan. Lab work done was normal.His symptoms was thought to be related to his dementia. Patient condition was stable therefore discharged.Wife reports patient's appetite is very good and eats all meals. Patient's wife states patient has had no fever, chills, nausea, vomiting or constipation. She request blood pressure medication Amlodipine to be refilled.   Past Medical History:  Diagnosis Date  . Abnormality of gait 06/07/2016  . Anemia    from AVM's. Requiring transfusion  . Aortic stenosis    s/p TAVI July 2012  . Arthritis   . Balance problem    WEAK ON RIGHT SIDE ( SCOLISOSIS AND HX OF A BACK SURGERY) - BALANCE PROBLEM - "FALLS BACKWARD" - FREQUENT FALLS.  USES CANE WHEN AMBULATING  . Cancer (HCC)    BLADDER CANCER  . Chronic kidney disease   . Complication  of anesthesia    VERY DROWSY FOR HOURS AFTER SURGERY - AND DISORIENTATION  . Dementia    more memory problems from stroke  . Fall   . GERD (gastroesophageal reflux disease)    RARE - AND NO MEDS  . GI bleed   . Glaucoma   . Glaucoma   . Heart murmur    PT'S CARDIOLOGIST IS DR. Acie Fredrickson -LAST OFFICE VISIT 03/02/13 IN [facility]  . History of blood transfusion LAST 2-3 YRS AGO   HAD  4 OR 5   . History of cerebral infarction 08/19/2013  . Hypercholesteremia   . Hyperlipemia   . Hypertension    Not taking Lisinopril due to BP drops low.  . Mitral regurgitation   . PONV (postoperative nausea and vomiting)    IN PAST, DID WELL WITH LAST SURGERY  . Shingles May 2013   right arm residual pain- last outbreak 1 1/2 yrs ago.  . Stroke ([place])    left internal capsule 1/15  . Transfusion history    last 2 yrs ago.   Past Surgical History:  Procedure Laterality Date  . AORTIC VALVE REPLACEMENT     02/2011([facility])  . APPENDECTOMY    . BACK SURGERY  2010 OR 2011   LOWER  . CARDIAC CATHETERIZATION  10/23/2010   ef 65%  . CARDIOVASCULAR STRESS TEST  01/01/2007   EF 64%, NO EVIDENCE OF ISCHEMIA  . CYSTOSCOPY W/ RETROGRADES Bilateral 09/17/2013   Procedure: CYSTOSCOPY WITH BILATERAL RETROGRADE;  Surgeon: [name], MD;  Location: [facility];  Service: Urology;  Laterality: Bilateral;  . CYSTOSCOPY W/ RETROGRADES Bilateral 11/15/2015   Procedure: CYSTO, BILATERAL RETROGRADE PYELOGRAM WITH BLADDER BIOPSY;  Surgeon: [name], MD;  Location: [facility];  Service: Urology;  Laterality: Bilateral;  . CYSTOSCOPY WITH STENT PLACEMENT Left 04/13/2013   Procedure:  LEFT URETERAL STENT PLACEMENT;  Surgeon: [name], MD;  Location: [facility];  Service: Urology;  Laterality: Left;  . EYE SURGERY     bilateral cataracts with lens implants  . TRANSURETHRAL RESECTION OF BLADDER TUMOR N/A 06/04/2013   Procedure: RESTAGING TRANSURETHRAL RESECTION OF BLADDER TUMOR (TURBT), URETERAL CATHERIZATION ;  Surgeon: [name], MD;  Location: [facility];  Service: Urology;  Laterality: N/A;  CYSTO RESTAGING TURBT     . TRANSURETHRAL RESECTION OF BLADDER TUMOR N/A 09/17/2013   Procedure: TRANSURETHRAL RESECTION OF BLADDER TUMOR WITH MITOMYCIN-C;  Surgeon: [name], MD;  Location: [facility];  Service: Urology;  Laterality: N/A;  . TRANSURETHRAL RESECTION OF BLADDER TUMOR WITH GYRUS (TURBT-GYRUS) N/A 04/13/2013    Procedure: TRANSURETHRAL RESECTION OF BLADDER TUMOR WITH GYRUS (TURBT-GYRUS);  Surgeon: [name], MD;  Location: [facility];  Service: Urology;  Laterality: N/A;  . US ECHOCARDIOGRAPHY  08/31/10   EF 55-60%    Allergies  Allergen Reactions  . Advil [Ibuprofen] Other (See Comments)    Causes stomach bleeding, need transfusions as result  . Aspirin Other (See Comments)    Causes stomach bleeding, needs transfusion as result  . Penicillins Itching and Swelling    Has patient had a PCN reaction causing immediate rash, facial/tongue/throat swelling, SOB or lightheadedness with hypotension: Unsure Has patient had a PCN reaction causing severe rash involving mucus membranes or skin necrosis: Unsure  Has patient had a PCN reaction that required hospitalization Yes Has patient had a PCN reaction occurring within the last 10 years: No If all of the above answers are "NO", then may proceed with Cephalosporin use.  . Aricept [  Donepezil]     Listed on face sheet  . Atorvastatin     Joint ache  . Famciclovir     Listed on face sheet  . Namenda [Memantine]     Stomach pain  . Other Other (See Comments)    No blood thinners due to GI bleed.  . Ativan [Lorazepam] Anxiety    He became very anxious and aggitated    Outpatient Encounter Prescriptions as of 04/11/2017  Medication Sig  . ALPRAZolam (XANAX) 0.5 MG tablet Take 0.5 mg by mouth 2 (two) times daily. Taking a half a tablet at breakfast  and 1 whole tablet at dinner time   . amLODipine (NORVASC) 5 MG tablet Take 1 tablet (5 mg total) by mouth daily.  . cholecalciferol (VITAMIN D) 1000 units tablet Take 1,000 Units by mouth daily.  . citalopram (CELEXA) 10 MG tablet Take 1 tablet (10 mg total) by mouth daily.  . iron polysaccharides (NIFEREX) 150 MG capsule Take 150 mg by mouth every morning.   . latanoprost (XALATAN) 0.005 % ophthalmic solution Place 1 drop into both eyes at bedtime.  Marland Kitchen loperamide (IMODIUM A-D) 2 MG tablet Take 1 tablet (2 mg  total) by mouth 4 (four) times daily as needed for diarrhea or loose stools.  . Multiple Vitamin (MULTIVITAMIN) tablet Take 1 tablet by mouth daily.  Marland Kitchen UNABLE TO FIND Med Name: Tylenol liquid 1 tablespoons 2-3 times to day.  . vitamin B-12 (CYANOCOBALAMIN) 1000 MCG tablet Take 1,000 mcg by mouth daily.  . [DISCONTINUED] amLODipine (NORVASC) 5 MG tablet Take 1 tablet (5 mg total) by mouth daily.   No facility-administered encounter medications on file as of 04/11/2017.     Review of Systems  Constitutional: Negative for activity change, appetite change, chills, fatigue, fever and unexpected weight change.  HENT: Positive for hearing loss. Negative for congestion, rhinorrhea, sinus pain, sinus pressure, sneezing and sore throat.        Wears hearing aid on right ear   Eyes: Negative for pain, discharge, redness and itching.  Respiratory: Negative for cough, chest tightness, shortness of breath and wheezing.   Cardiovascular: Negative for chest pain, palpitations and leg swelling.  Gastrointestinal: Positive for diarrhea. Negative for abdominal distention, abdominal pain, blood in stool, constipation, nausea and vomiting.  Genitourinary: Negative for dysuria, flank pain and urgency.  Musculoskeletal: Positive for gait problem.  Skin: Negative for color change, pallor and rash.  Neurological: Negative for dizziness, seizures, syncope, light-headedness and headaches.  Hematological: Does not bruise/bleed easily.  Psychiatric/Behavioral: Negative for agitation, hallucinations and sleep disturbance. The patient is not nervous/anxious.        Confused at baseline    Immunization History  Administered Date(s) Administered  . Influenza Nasal 05/13/2016  . Pneumococcal-Unspecified 03/07/2011  . Tdap 02/18/2015, 06/01/2016   Pertinent  Health Maintenance Due  Topic Date Due  . INFLUENZA VACCINE  03/06/2017  . PNA vac Low Risk Adult (2 of 2 - PCV13) 08/06/2017 (Originally 03/06/2012)   No  flowsheet data found.   Vitals:   04/11/17 1139  BP: 130/70  Pulse: 91  Resp: 14  Temp: 97.7 F (36.5 C)  TempSrc: Oral  SpO2: 92%  Weight: 148 lb (67.1 kg)  Height: 5\' 7"  (1.702 m)   Body mass index is 23.18 kg/m. Physical Exam  Constitutional: He appears well-developed and well-nourished.  Elderly in no acute distress   HENT:  Head: Normocephalic.  Right Ear: External ear normal.  Left Ear: External ear normal.  Mouth/Throat: Oropharynx is clear and moist. No oropharyngeal exudate.  Left hearing Aid in place  Eyes: Pupils are equal, round, and reactive to light. Conjunctivae and EOM are normal. Right eye exhibits no discharge. Left eye exhibits no discharge. No scleral icterus.  Eye glasses in place  Neck: Normal range of motion. No JVD present. No thyromegaly present.  Cardiovascular: Normal rate, regular rhythm, normal heart sounds and intact distal pulses.  Exam reveals no gallop and no friction rub.   No murmur heard. Pulmonary/Chest: Effort normal and breath sounds normal. No respiratory distress. He has no wheezes. He has no rales.  Abdominal: Soft. Bowel sounds are normal. He exhibits no distension. There is no tenderness. There is no rebound and no guarding.  Musculoskeletal: He exhibits no edema, tenderness or deformity.  Moves x 4 extremities without any difficulties.Unsteady gait uses wheelchair with assistance.   Lymphadenopathy:    He has no cervical adenopathy.  Neurological: He is alert. Coordination normal.  Oriented to self.Confused at his baseline.Unsteady gait  Skin: Skin is warm and dry. No rash noted. No erythema. No pallor.  Psychiatric: He has a normal mood and affect.   Labs reviewed:  Recent Labs  03/11/17 0830 03/18/17 1220 03/20/17 0814  NA 140 139 142  K 3.4* 3.8 3.4*  CL 103 105 106  CO2 25 27 24   GLUCOSE 84 93 83  BUN 14 15 16   CREATININE 1.24* 1.33* 1.28*  CALCIUM 8.9 8.8* 8.8  MG 2.0  --  2.0    Recent Labs  03/11/17 0830  03/18/17 1220 03/20/17 0814  AST 13 22 10   ALT 8* 5* 4*  ALKPHOS 66 59 63  BILITOT 0.5 0.7 0.5  PROT 7.3 7.4 6.6  ALBUMIN 3.9 3.4* 3.5*    Recent Labs  12/06/16 1023 12/06/16 1038 02/13/17 1115 03/18/17 1220  WBC 9.7  --  5.1 6.1  NEUTROABS 8.6*  --  3,927 5.0  HGB 11.1* 10.9* 11.6* 11.8*  HCT 33.6* 32.0* 34.1* 34.9*  MCV 94.9  --  93.2 91.8  PLT 144*  --  216 180   Lab Results  Component Value Date   TSH 2.55 02/13/2017   Lab Results  Component Value Date   HGBA1C 5.2 08/19/2013   Lab Results  Component Value Date   CHOL 167 09/24/2016   HDL 46 09/24/2016   LDLCALC 98 09/24/2016   TRIG 114 09/24/2016   CHOLHDL 3.6 09/24/2016    Significant Diagnostic Results in last 30 days:  Ct Head Wo Contrast  Result Date: 03/18/2017 CLINICAL DATA:  Worsening lethargy. EXAM: CT HEAD WITHOUT CONTRAST TECHNIQUE: Contiguous axial images were obtained from the base of the skull through the vertex without intravenous contrast. COMPARISON:  12/06/2016 FINDINGS: Brain: There is no evidence for acute hemorrhage, hydrocephalus, mass lesion, or abnormal extra-axial fluid collection. No definite CT evidence for acute infarction. Diffuse loss of parenchymal volume is consistent with atrophy. Patchy low attenuation in the deep hemispheric and periventricular white matter is nonspecific, but likely reflects chronic microvascular ischemic demyelination. Lacunar infarct left basal ganglia unchanged. Vascular: Choose 1 Skull: No evidence for fracture. No worrisome lytic or sclerotic lesion. Sinuses/Orbits: The visualized paranasal sinuses and mastoid air cells are clear. Visualized portions of the globes and intraorbital fat are unremarkable. Other: None. IMPRESSION: 1. Stable exam.  No acute intracranial abnormality. 2. Atrophy with chronic small vessel white matter ischemic disease. Electronically Signed   By: Misty Stanley M.D.   On: 03/18/2017 13:36  Assessment/Plan 1.  Diarrhea Afebrile.Appetite reported as very good.Currently off diary products.Infectious causes has been rule out. IBS suspected by GI samples of new medication given but wife has stopped giving due to no change in condition. Recommend to follow up with GI as instructed.   2. Hypertensive heart and CKD stage 2 B/p stable.Continue on amlodipine 5 mg Tablet daily. Amlodipine 5 mg tablet refilled: Quantity 90 Refill: 3 continue to monitor.    Family/ staff Communication: Reviewed plan of care with patient and patient's wife.  Labs/tests ordered: None   Tinna Kolker C Johnny Latu, NP

## 2017-04-16 DIAGNOSIS — R2681 Unsteadiness on feet: Secondary | ICD-10-CM | POA: Diagnosis not present

## 2017-04-16 DIAGNOSIS — R296 Repeated falls: Secondary | ICD-10-CM | POA: Diagnosis not present

## 2017-04-16 DIAGNOSIS — R262 Difficulty in walking, not elsewhere classified: Secondary | ICD-10-CM | POA: Diagnosis not present

## 2017-04-16 DIAGNOSIS — M6281 Muscle weakness (generalized): Secondary | ICD-10-CM | POA: Diagnosis not present

## 2017-04-16 DIAGNOSIS — G468 Other vascular syndromes of brain in cerebrovascular diseases: Secondary | ICD-10-CM | POA: Diagnosis not present

## 2017-04-16 DIAGNOSIS — Z952 Presence of prosthetic heart valve: Secondary | ICD-10-CM | POA: Diagnosis not present

## 2017-04-18 DIAGNOSIS — R296 Repeated falls: Secondary | ICD-10-CM | POA: Diagnosis not present

## 2017-04-18 DIAGNOSIS — M6281 Muscle weakness (generalized): Secondary | ICD-10-CM | POA: Diagnosis not present

## 2017-04-18 DIAGNOSIS — Z952 Presence of prosthetic heart valve: Secondary | ICD-10-CM | POA: Diagnosis not present

## 2017-04-18 DIAGNOSIS — R262 Difficulty in walking, not elsewhere classified: Secondary | ICD-10-CM | POA: Diagnosis not present

## 2017-04-18 DIAGNOSIS — R2681 Unsteadiness on feet: Secondary | ICD-10-CM | POA: Diagnosis not present

## 2017-04-18 DIAGNOSIS — G468 Other vascular syndromes of brain in cerebrovascular diseases: Secondary | ICD-10-CM | POA: Diagnosis not present

## 2017-04-24 DIAGNOSIS — R2681 Unsteadiness on feet: Secondary | ICD-10-CM | POA: Diagnosis not present

## 2017-04-24 DIAGNOSIS — G468 Other vascular syndromes of brain in cerebrovascular diseases: Secondary | ICD-10-CM | POA: Diagnosis not present

## 2017-04-24 DIAGNOSIS — R262 Difficulty in walking, not elsewhere classified: Secondary | ICD-10-CM | POA: Diagnosis not present

## 2017-04-24 DIAGNOSIS — M6281 Muscle weakness (generalized): Secondary | ICD-10-CM | POA: Diagnosis not present

## 2017-04-24 DIAGNOSIS — Z952 Presence of prosthetic heart valve: Secondary | ICD-10-CM | POA: Diagnosis not present

## 2017-04-24 DIAGNOSIS — R296 Repeated falls: Secondary | ICD-10-CM | POA: Diagnosis not present

## 2017-04-25 DIAGNOSIS — R2681 Unsteadiness on feet: Secondary | ICD-10-CM | POA: Diagnosis not present

## 2017-04-25 DIAGNOSIS — M6281 Muscle weakness (generalized): Secondary | ICD-10-CM | POA: Diagnosis not present

## 2017-04-25 DIAGNOSIS — R296 Repeated falls: Secondary | ICD-10-CM | POA: Diagnosis not present

## 2017-04-25 DIAGNOSIS — R262 Difficulty in walking, not elsewhere classified: Secondary | ICD-10-CM | POA: Diagnosis not present

## 2017-04-25 DIAGNOSIS — Z952 Presence of prosthetic heart valve: Secondary | ICD-10-CM | POA: Diagnosis not present

## 2017-04-25 DIAGNOSIS — G468 Other vascular syndromes of brain in cerebrovascular diseases: Secondary | ICD-10-CM | POA: Diagnosis not present

## 2017-04-27 ENCOUNTER — Other Ambulatory Visit: Payer: Self-pay | Admitting: Internal Medicine

## 2017-04-30 DIAGNOSIS — R2681 Unsteadiness on feet: Secondary | ICD-10-CM | POA: Diagnosis not present

## 2017-04-30 DIAGNOSIS — R296 Repeated falls: Secondary | ICD-10-CM | POA: Diagnosis not present

## 2017-04-30 DIAGNOSIS — G468 Other vascular syndromes of brain in cerebrovascular diseases: Secondary | ICD-10-CM | POA: Diagnosis not present

## 2017-04-30 DIAGNOSIS — Z952 Presence of prosthetic heart valve: Secondary | ICD-10-CM | POA: Diagnosis not present

## 2017-04-30 DIAGNOSIS — R262 Difficulty in walking, not elsewhere classified: Secondary | ICD-10-CM | POA: Diagnosis not present

## 2017-04-30 DIAGNOSIS — M6281 Muscle weakness (generalized): Secondary | ICD-10-CM | POA: Diagnosis not present

## 2017-05-07 DIAGNOSIS — R2681 Unsteadiness on feet: Secondary | ICD-10-CM | POA: Diagnosis not present

## 2017-05-07 DIAGNOSIS — N184 Chronic kidney disease, stage 4 (severe): Secondary | ICD-10-CM | POA: Diagnosis not present

## 2017-05-07 DIAGNOSIS — D509 Iron deficiency anemia, unspecified: Secondary | ICD-10-CM | POA: Diagnosis not present

## 2017-05-07 DIAGNOSIS — C679 Malignant neoplasm of bladder, unspecified: Secondary | ICD-10-CM | POA: Diagnosis not present

## 2017-05-07 DIAGNOSIS — Z952 Presence of prosthetic heart valve: Secondary | ICD-10-CM | POA: Diagnosis not present

## 2017-05-07 DIAGNOSIS — E7800 Pure hypercholesterolemia, unspecified: Secondary | ICD-10-CM | POA: Diagnosis not present

## 2017-05-07 DIAGNOSIS — N401 Enlarged prostate with lower urinary tract symptoms: Secondary | ICD-10-CM | POA: Diagnosis not present

## 2017-05-07 DIAGNOSIS — R262 Difficulty in walking, not elsewhere classified: Secondary | ICD-10-CM | POA: Diagnosis not present

## 2017-05-07 DIAGNOSIS — R296 Repeated falls: Secondary | ICD-10-CM | POA: Diagnosis not present

## 2017-05-07 DIAGNOSIS — M6281 Muscle weakness (generalized): Secondary | ICD-10-CM | POA: Diagnosis not present

## 2017-05-07 DIAGNOSIS — I10 Essential (primary) hypertension: Secondary | ICD-10-CM | POA: Diagnosis not present

## 2017-05-07 DIAGNOSIS — G468 Other vascular syndromes of brain in cerebrovascular diseases: Secondary | ICD-10-CM | POA: Diagnosis not present

## 2017-05-07 DIAGNOSIS — F0390 Unspecified dementia without behavioral disturbance: Secondary | ICD-10-CM | POA: Diagnosis not present

## 2017-05-07 DIAGNOSIS — E638 Other specified nutritional deficiencies: Secondary | ICD-10-CM | POA: Diagnosis not present

## 2017-05-07 DIAGNOSIS — R42 Dizziness and giddiness: Secondary | ICD-10-CM | POA: Diagnosis not present

## 2017-05-09 DIAGNOSIS — Z952 Presence of prosthetic heart valve: Secondary | ICD-10-CM | POA: Diagnosis not present

## 2017-05-09 DIAGNOSIS — G468 Other vascular syndromes of brain in cerebrovascular diseases: Secondary | ICD-10-CM | POA: Diagnosis not present

## 2017-05-09 DIAGNOSIS — R2681 Unsteadiness on feet: Secondary | ICD-10-CM | POA: Diagnosis not present

## 2017-05-09 DIAGNOSIS — R262 Difficulty in walking, not elsewhere classified: Secondary | ICD-10-CM | POA: Diagnosis not present

## 2017-05-09 DIAGNOSIS — R296 Repeated falls: Secondary | ICD-10-CM | POA: Diagnosis not present

## 2017-05-09 DIAGNOSIS — M6281 Muscle weakness (generalized): Secondary | ICD-10-CM | POA: Diagnosis not present

## 2017-05-09 DIAGNOSIS — K591 Functional diarrhea: Secondary | ICD-10-CM | POA: Diagnosis not present

## 2017-05-15 DIAGNOSIS — Z23 Encounter for immunization: Secondary | ICD-10-CM | POA: Diagnosis not present

## 2017-05-16 DIAGNOSIS — M6281 Muscle weakness (generalized): Secondary | ICD-10-CM | POA: Diagnosis not present

## 2017-05-16 DIAGNOSIS — R2681 Unsteadiness on feet: Secondary | ICD-10-CM | POA: Diagnosis not present

## 2017-05-16 DIAGNOSIS — G468 Other vascular syndromes of brain in cerebrovascular diseases: Secondary | ICD-10-CM | POA: Diagnosis not present

## 2017-05-16 DIAGNOSIS — Z952 Presence of prosthetic heart valve: Secondary | ICD-10-CM | POA: Diagnosis not present

## 2017-05-16 DIAGNOSIS — R296 Repeated falls: Secondary | ICD-10-CM | POA: Diagnosis not present

## 2017-05-16 DIAGNOSIS — R262 Difficulty in walking, not elsewhere classified: Secondary | ICD-10-CM | POA: Diagnosis not present

## 2017-05-21 ENCOUNTER — Telehealth: Payer: Self-pay | Admitting: Internal Medicine

## 2017-05-21 DIAGNOSIS — M6281 Muscle weakness (generalized): Secondary | ICD-10-CM | POA: Diagnosis not present

## 2017-05-21 DIAGNOSIS — G468 Other vascular syndromes of brain in cerebrovascular diseases: Secondary | ICD-10-CM | POA: Diagnosis not present

## 2017-05-21 DIAGNOSIS — R296 Repeated falls: Secondary | ICD-10-CM | POA: Diagnosis not present

## 2017-05-21 DIAGNOSIS — R262 Difficulty in walking, not elsewhere classified: Secondary | ICD-10-CM | POA: Diagnosis not present

## 2017-05-21 DIAGNOSIS — R2681 Unsteadiness on feet: Secondary | ICD-10-CM | POA: Diagnosis not present

## 2017-05-21 DIAGNOSIS — Z952 Presence of prosthetic heart valve: Secondary | ICD-10-CM | POA: Diagnosis not present

## 2017-05-21 NOTE — Telephone Encounter (Signed)
Left msg asking pt to call and schedule AWV-I at [facility] clinic. VDM (DD)

## 2017-05-28 ENCOUNTER — Ambulatory Visit: Payer: Medicare Other | Admitting: Cardiovascular Disease

## 2017-05-28 DIAGNOSIS — R262 Difficulty in walking, not elsewhere classified: Secondary | ICD-10-CM | POA: Diagnosis not present

## 2017-05-28 DIAGNOSIS — R2681 Unsteadiness on feet: Secondary | ICD-10-CM | POA: Diagnosis not present

## 2017-05-28 DIAGNOSIS — Z952 Presence of prosthetic heart valve: Secondary | ICD-10-CM | POA: Diagnosis not present

## 2017-05-28 DIAGNOSIS — M6281 Muscle weakness (generalized): Secondary | ICD-10-CM | POA: Diagnosis not present

## 2017-05-28 DIAGNOSIS — G468 Other vascular syndromes of brain in cerebrovascular diseases: Secondary | ICD-10-CM | POA: Diagnosis not present

## 2017-05-28 DIAGNOSIS — R296 Repeated falls: Secondary | ICD-10-CM | POA: Diagnosis not present

## 2017-05-30 DIAGNOSIS — M6281 Muscle weakness (generalized): Secondary | ICD-10-CM | POA: Diagnosis not present

## 2017-05-30 DIAGNOSIS — Z952 Presence of prosthetic heart valve: Secondary | ICD-10-CM | POA: Diagnosis not present

## 2017-05-30 DIAGNOSIS — R2681 Unsteadiness on feet: Secondary | ICD-10-CM | POA: Diagnosis not present

## 2017-05-30 DIAGNOSIS — R296 Repeated falls: Secondary | ICD-10-CM | POA: Diagnosis not present

## 2017-05-30 DIAGNOSIS — R262 Difficulty in walking, not elsewhere classified: Secondary | ICD-10-CM | POA: Diagnosis not present

## 2017-05-30 DIAGNOSIS — G468 Other vascular syndromes of brain in cerebrovascular diseases: Secondary | ICD-10-CM | POA: Diagnosis not present

## 2017-06-04 DIAGNOSIS — R296 Repeated falls: Secondary | ICD-10-CM | POA: Diagnosis not present

## 2017-06-04 DIAGNOSIS — R2681 Unsteadiness on feet: Secondary | ICD-10-CM | POA: Diagnosis not present

## 2017-06-04 DIAGNOSIS — G468 Other vascular syndromes of brain in cerebrovascular diseases: Secondary | ICD-10-CM | POA: Diagnosis not present

## 2017-06-04 DIAGNOSIS — Z952 Presence of prosthetic heart valve: Secondary | ICD-10-CM | POA: Diagnosis not present

## 2017-06-04 DIAGNOSIS — R262 Difficulty in walking, not elsewhere classified: Secondary | ICD-10-CM | POA: Diagnosis not present

## 2017-06-04 DIAGNOSIS — M6281 Muscle weakness (generalized): Secondary | ICD-10-CM | POA: Diagnosis not present

## 2017-06-05 NOTE — Telephone Encounter (Signed)
I spoke with the pt's wife, Danton Clap, about AWV.  Pt isn't available on Friday 11/2 so I explained that I will call back once I know when the nurse will be back at [facility] VDM (DD)

## 2017-06-11 ENCOUNTER — Ambulatory Visit: Payer: Medicare Other | Admitting: Cardiovascular Disease

## 2017-06-12 ENCOUNTER — Ambulatory Visit (INDEPENDENT_AMBULATORY_CARE_PROVIDER_SITE_OTHER): Payer: Medicare Other | Admitting: Cardiovascular Disease

## 2017-06-12 ENCOUNTER — Encounter: Payer: Self-pay | Admitting: Cardiovascular Disease

## 2017-06-12 VITALS — BP 122/50 | HR 67 | Resp 16 | Ht 67.0 in

## 2017-06-12 DIAGNOSIS — I350 Nonrheumatic aortic (valve) stenosis: Secondary | ICD-10-CM | POA: Diagnosis not present

## 2017-06-12 DIAGNOSIS — I1310 Hypertensive heart and chronic kidney disease without heart failure, with stage 1 through stage 4 chronic kidney disease, or unspecified chronic kidney disease: Secondary | ICD-10-CM | POA: Diagnosis not present

## 2017-06-12 DIAGNOSIS — N182 Chronic kidney disease, stage 2 (mild): Secondary | ICD-10-CM | POA: Diagnosis not present

## 2017-06-12 DIAGNOSIS — Z952 Presence of prosthetic heart valve: Secondary | ICD-10-CM | POA: Diagnosis not present

## 2017-06-12 NOTE — Patient Instructions (Signed)

## 2017-06-12 NOTE — Progress Notes (Signed)
Bryan Moore Date of Birth  12-12-1919       [facility]    [employer] [address], [address], [place], [place]  [address]   [place], [place]  [address] [phone]     [phone]   Fax  [phone]    Fax [phone]  Problem List: 1. Aortic stenosis-status post TAVR,  2012 2. AV formations with recurrent GI bleeds 3. hypertension 4. Hyperlipidemia. 5. Shingles 6. Frequent Falls ( falls backwards) 7. CVA  History of Present Illness:  Pt has been doing well from a cardiac standpoint.  He denies any episodes of chest pain or shortness breath. He has been falling recently and stopped his lisinopril because he thought it was causing him to have some dizziness. He's been falling backwards.  March 02, 2013:  Bryan Moore is doing well.  He has some dyspnea - especially with exertion.  Still have balance issues.   October 07, 2013:  Bryan Moore has had a stroke since I last saw him.  No CP, no dyspnea.  He was seen with his wife who answered most of the questions  Jan. 31, 2017:  Doing well.  Has slowed down since his stroke Has gained some weight.  Has increased DOE.     No PND or orthopnea .  May be eating at bit of extra salt .   Nov. 7, 2018  Bryan Moore is seen today  Dementia has significantly worsened They have moved to [facility].  Independent living . Spends most of his time in wheelchair.  Has lots of diarrhea Has some difficulty swallowing his meds.  Is able to converse some,  Frequently is not understandable    Current Outpatient Medications on File Prior to Visit  Medication Sig Dispense Refill  . ALPRAZolam (XANAX) 0.5 MG tablet take 1 tablet by mouth every 8 hours if needed for SEVERE ANXIETY 90 tablet 0  . amLODipine (NORVASC) 5 MG tablet Take 1 tablet (5 mg total) by mouth daily. 90 tablet 3  . cholecalciferol (VITAMIN D) 1000 units tablet Take 1,000 Units by mouth daily.    . citalopram (CELEXA) 10 MG tablet  Take 1 tablet (10 mg total) by mouth daily. 30 tablet 3  . iron polysaccharides (NIFEREX) 150 MG capsule Take 150 mg by mouth every morning.     . latanoprost (XALATAN) 0.005 % ophthalmic solution Place 1 drop into both eyes at bedtime.    Marland Kitchen loperamide (IMODIUM A-D) 2 MG tablet Take 1 tablet (2 mg total) by mouth 4 (four) times daily as needed for diarrhea or loose stools. 30 tablet 0  . Multiple Vitamin (MULTIVITAMIN) tablet Take 1 tablet by mouth daily.    Marland Kitchen UNABLE TO FIND Med Name: Tylenol liquid 1 tablespoons 2-3 times to day.    . vitamin B-12 (CYANOCOBALAMIN) 1000 MCG tablet Take 1,000 mcg by mouth daily.     No current facility-administered medications on file prior to visit.     Allergies  Allergen Reactions  . Advil [Ibuprofen] Other (See Comments)    Causes stomach bleeding, need transfusions as result  . Aspirin Other (See Comments)    Causes stomach bleeding, needs transfusion as result  . Penicillins Itching and Swelling    Has patient had a PCN reaction causing immediate rash, facial/tongue/throat swelling, SOB or lightheadedness with hypotension: Unsure Has patient had a PCN reaction causing severe rash involving mucus membranes or skin necrosis: Unsure  Has patient  had a PCN reaction that required hospitalization Yes Has patient had a PCN reaction occurring within the last 10 years: No If all of the above answers are "NO", then may proceed with Cephalosporin use.  . Aricept [Donepezil]     Listed on face sheet  . Atorvastatin     Joint ache  . Famciclovir     Listed on face sheet  . Namenda [Memantine]     Stomach pain  . Other Other (See Comments)    No blood thinners due to GI bleed.  . Ativan [Lorazepam] Anxiety    He became very anxious and aggitated    Past Medical History:  Diagnosis Date  . Abnormality of gait 06/07/2016  . Anemia    from AVM's. Requiring transfusion  . Aortic stenosis    s/p TAVI July 2012  . Arthritis   . Balance problem    WEAK  ON RIGHT SIDE ( SCOLISOSIS AND HX OF A BACK SURGERY) - BALANCE PROBLEM - "FALLS BACKWARD" - FREQUENT FALLS.  USES CANE WHEN AMBULATING  . Cancer (HCC)    BLADDER CANCER  . Chronic kidney disease   . Complication of anesthesia    VERY DROWSY FOR HOURS AFTER SURGERY - AND DISORIENTATION  . Dementia    more memory problems from stroke  . Fall   . GERD (gastroesophageal reflux disease)    RARE - AND NO MEDS  . GI bleed   . Glaucoma   . Glaucoma   . Heart murmur    PT'S CARDIOLOGIST IS DR. Acie Fredrickson -LAST OFFICE VISIT 03/02/13 IN [facility]  . History of blood transfusion LAST 2-3 YRS AGO   HAD 4 OR 5   . History of cerebral infarction 08/19/2013  . Hypercholesteremia   . Hyperlipemia   . Hypertension    Not taking Lisinopril due to BP drops low.  . Mitral regurgitation   . PONV (postoperative nausea and vomiting)    IN PAST, DID WELL WITH LAST SURGERY  . Shingles May 2013   right arm residual pain- last outbreak 1 1/2 yrs ago.  . Stroke ([place])    left internal capsule 1/15  . Transfusion history    last 2 yrs ago.    Past Surgical History:  Procedure Laterality Date  . AORTIC VALVE REPLACEMENT     02/2011([facility])  . APPENDECTOMY    . BACK SURGERY  2010 OR 2011   LOWER  . CARDIAC CATHETERIZATION  10/23/2010   ef 65%  . CARDIOVASCULAR STRESS TEST  01/01/2007   EF 64%, NO EVIDENCE OF ISCHEMIA  . EYE SURGERY     bilateral cataracts with lens implants  . US ECHOCARDIOGRAPHY  08/31/10   EF 55-60%    Social History   Tobacco Use  Smoking Status Former Smoker  . Types: Pipe  . Last attempt to quit: 07/04/1981  . Years since quitting: 35.9  Smokeless Tobacco Never Used    Social History   Substance and Sexual Activity  Alcohol Use Yes  . Alcohol/week: 0.6 oz  . Types: 1 Glasses of wine per week    Family History  Problem Relation Age of Onset  . COPD Father   . Stroke Father   . Parkinson's disease Brother 13  . Heart failure Mother   . Bowel Disease Sister 56     Reviw of Systems:  Reviewed in the HPI.  All other systems are negative.  Physical Exam: Blood pressure (!) 122/50, pulse 67, resp. rate 16, height  5\' 7"  (1.702 m), SpO2 97 %.  GEN:  Well nourished, well developed in no acute distress HEENT: Normal NECK: No JVD; No carotid bruits LYMPHATICS: No lymphadenopathy CARDIAC: RR, soft systolic murmur, rubs, gallops RESPIRATORY:  Clear to auscultation without rales, wheezing or rhonchi  ABDOMEN: Soft, non-tender, non-distended MUSCULOSKELETAL:  No edema; No deformity  SKIN: Warm and dry NEUROLOGIC:  Alert and oriented x 3    ECG:  Assessment / Plan:   1. Aortic stenosis-status post T AVR.  2. AV formations with recurrent GI bleeds  3. Hypertension - BP is well controlled.  Allen's blood pressure is well controlled.  He has fairly severe dementia and it is quite sure to bring him out to the office.  If his primary medical doctor will agree to manage his blood pressure pills, then I think that I will not need to see him on a regular basis.  He sees his primary doctor as well as several specialist. Given  his advanced age and his degree of dementia, I do not anticipate doing any further cardiac testing.  I explained this to his wife and she completely agrees.  4. Hyperlipidemia. 5. Shingles 6. Frequent Falls ( falls backwards) 7. CVA 8.   Dyspnea -    Mertie Moores, MD  06/12/2017 11:44 AM    [facility] [place],  [place], [place]  [address] Pager [phone] Phone: [phone]; Fax: [phone]

## 2017-06-19 ENCOUNTER — Encounter: Payer: Self-pay | Admitting: Internal Medicine

## 2017-06-21 ENCOUNTER — Other Ambulatory Visit: Payer: Self-pay | Admitting: *Deleted

## 2017-06-21 MED ORDER — ALPRAZOLAM 0.5 MG PO TABS: ORAL_TABLET | ORAL | 0 refills | Status: DC

## 2017-06-21 NOTE — Telephone Encounter (Signed)
[facility] [employer]

## 2017-06-24 DIAGNOSIS — N3941 Urge incontinence: Secondary | ICD-10-CM | POA: Diagnosis not present

## 2017-06-24 DIAGNOSIS — N401 Enlarged prostate with lower urinary tract symptoms: Secondary | ICD-10-CM | POA: Diagnosis not present

## 2017-06-24 DIAGNOSIS — Z8551 Personal history of malignant neoplasm of bladder: Secondary | ICD-10-CM | POA: Diagnosis not present

## 2017-07-04 ENCOUNTER — Ambulatory Visit (INDEPENDENT_AMBULATORY_CARE_PROVIDER_SITE_OTHER): Payer: Medicare Other | Admitting: Adult Health

## 2017-07-04 ENCOUNTER — Encounter: Payer: Self-pay | Admitting: Adult Health

## 2017-07-04 ENCOUNTER — Ambulatory Visit: Payer: Medicare Other | Admitting: Neurology

## 2017-07-04 VITALS — BP 111/57 | HR 71

## 2017-07-04 DIAGNOSIS — F0391 Unspecified dementia with behavioral disturbance: Secondary | ICD-10-CM

## 2017-07-04 MED ORDER — CITALOPRAM HYDROBROMIDE 10 MG PO TABS: 10.0000 mg | ORAL_TABLET | Freq: Every day | ORAL | 11 refills | Status: DC

## 2017-07-04 NOTE — Progress Notes (Signed)
I have read the note, and I agree with the clinical assessment and plan.  Torie Priebe K Wilmina Maxham   

## 2017-07-04 NOTE — Patient Instructions (Signed)
Your Plan:  Continue Celexa Memory score is stable If your symptoms worsen or you develop new symptoms please let us know.   Thank you for coming to see [place] at [facility]. I hope we have been able to provide you high quality care today.  You may receive a patient satisfaction survey over the next few weeks. We would appreciate your feedback and comments so that we may continue to improve ourselves and the health of our patients.

## 2017-07-04 NOTE — Progress Notes (Signed)
PATIENT: Bryan Moore DOB: [birth year]  REASON FOR VISIT: follow up-memory HISTORY FROM: patient, wife, caregiver  HISTORY OF PRESENT ILLNESS: Today 07/04/17 Bryan Moore is a 83 year old male with a history of dementia and agitation.  He returns today for follow-up.  The patient is currently not on any memory medication.  He is on Celexa 10 mg daily for agitation.  His wife reports that the patient has remained stable.  He lives at home with his wife.  He does have a caregiver that helps him throughout the day.  He is able to complete all ADLs independently.  He uses a walker for only short distances but primarily uses a wheelchair.  Denies any trouble sleeping.  The wife reports sometimes he is very active in his sleep.  Reports good appetite.  The wife reports that he does have chronic diarrhea and they have been unable to determine a cause  The wife reports that his agitation is controlled with Xanax.  Overall she feels that the patient is doing well.   HISTORY 12/18/16: Bryan Moore is a 83 year old right handed white male with a history of dementia and agitation. He has delusional thinking, he will need to go to work or that his mother or father is still living. The patient usually sleeps well at night. He will have good days and bad days with agitation. He is not actually having hallucinations. He recently was placed on Zoloft but this resulted in increased agitation, Risperdal resulted in altered mental status. The patient had to go to the emergency room for a stroke evaluation, CT of the brain was done and was unremarkable. The patient has been placed back on the original medication of Celexa at 20 mg daily. He has a significant gait disorder, he tends to lean backwards. He has not had any recent falls. He mainly uses a wheelchair for ambulation, he may walk short distances with his wife with a walker. He eats and drinks fairly well. He returns to this office for an evaluation.  REVIEW OF  SYSTEMS: Out of a complete 14 system review of symptoms, the patient complains only of the following symptoms, and all other reviewed systems are negative.  Confusion, nervous/anxious, muscle cramps, incontinence of bladder, diarrhea, snoring, sleep talking him a choking, trouble swallowing, fatigue  ALLERGIES: Allergies  Allergen Reactions  . Advil [Ibuprofen] Other (See Comments)    Causes stomach bleeding, need transfusions as result  . Aspirin Other (See Comments)    Causes stomach bleeding, needs transfusion as result  . Penicillins Itching and Swelling    Has patient had a PCN reaction causing immediate rash, facial/tongue/throat swelling, SOB or lightheadedness with hypotension: Unsure Has patient had a PCN reaction causing severe rash involving mucus membranes or skin necrosis: Unsure  Has patient had a PCN reaction that required hospitalization Yes Has patient had a PCN reaction occurring within the last 10 years: No If all of the above answers are "NO", then may proceed with Cephalosporin use.  . Aricept [Donepezil]     Listed on face sheet  . Atorvastatin     Joint ache  . Famciclovir     Listed on face sheet  . Namenda [Memantine]     Stomach pain  . Other Other (See Comments)    No blood thinners due to GI bleed.  . Ativan [Lorazepam] Anxiety    He became very anxious and aggitated    HOME MEDICATIONS: Outpatient Medications Prior to Visit  Medication Sig  Dispense Refill  . ALPRAZolam (XANAX) 0.5 MG tablet Take one tablet by mouth every 8 hours as needed for severe anxiety 90 tablet 0  . amLODipine (NORVASC) 5 MG tablet Take 1 tablet (5 mg total) by mouth daily. 90 tablet 3  . cholecalciferol (VITAMIN D) 1000 units tablet Take 1,000 Units by mouth daily.    . citalopram (CELEXA) 10 MG tablet Take 1 tablet (10 mg total) by mouth daily. 30 tablet 3  . iron polysaccharides (NIFEREX) 150 MG capsule Take 150 mg by mouth every morning.     . latanoprost (XALATAN) 0.005 %  ophthalmic solution Place 1 drop into both eyes at bedtime.    Marland Kitchen loperamide (IMODIUM A-D) 2 MG tablet Take 1 tablet (2 mg total) by mouth 4 (four) times daily as needed for diarrhea or loose stools. 30 tablet 0  . Multiple Vitamin (MULTIVITAMIN) tablet Take 1 tablet by mouth daily.    Marland Kitchen UNABLE TO FIND Med Name: Tylenol liquid 1 tablespoons 2-3 times to day.    . vitamin B-12 (CYANOCOBALAMIN) 1000 MCG tablet Take 1,000 mcg by mouth daily.     No facility-administered medications prior to visit.     PAST MEDICAL HISTORY: Past Medical History:  Diagnosis Date  . Abnormality of gait 06/07/2016  . Anemia    from AVM's. Requiring transfusion  . Aortic stenosis    s/p TAVI July 2012  . Arthritis   . Balance problem    WEAK ON RIGHT SIDE ( SCOLISOSIS AND HX OF A BACK SURGERY) - BALANCE PROBLEM - "FALLS BACKWARD" - FREQUENT FALLS.  USES CANE WHEN AMBULATING  . Cancer (HCC)    BLADDER CANCER  . Chronic kidney disease   . Complication of anesthesia    VERY DROWSY FOR HOURS AFTER SURGERY - AND DISORIENTATION  . Dementia    more memory problems from stroke  . Fall   . GERD (gastroesophageal reflux disease)    RARE - AND NO MEDS  . GI bleed   . Glaucoma   . Glaucoma   . Heart murmur    PT'S CARDIOLOGIST IS DR. Acie Fredrickson -LAST OFFICE VISIT 03/02/13 IN [facility]  . History of blood transfusion LAST 2-3 YRS AGO   HAD 4 OR 5   . History of cerebral infarction 08/19/2013  . Hypercholesteremia   . Hyperlipemia   . Hypertension    Not taking Lisinopril due to BP drops low.  . Mitral regurgitation   . PONV (postoperative nausea and vomiting)    IN PAST, DID WELL WITH LAST SURGERY  . Shingles May 2013   right arm residual pain- last outbreak 1 1/2 yrs ago.  . Stroke ([place])    left internal capsule 1/15  . Transfusion history    last 2 yrs ago.    PAST SURGICAL HISTORY: Past Surgical History:  Procedure Laterality Date  . AORTIC VALVE REPLACEMENT     02/2011([facility])  . APPENDECTOMY    .  BACK SURGERY  2010 OR 2011   LOWER  . CARDIAC CATHETERIZATION  10/23/2010   ef 65%  . CARDIOVASCULAR STRESS TEST  01/01/2007   EF 64%, NO EVIDENCE OF ISCHEMIA  . CYSTOSCOPY W/ RETROGRADES Bilateral 09/17/2013   Procedure: CYSTOSCOPY WITH BILATERAL RETROGRADE;  Surgeon: [name], MD;  Location: [facility];  Service: Urology;  Laterality: Bilateral;  . CYSTOSCOPY W/ RETROGRADES Bilateral 11/15/2015   Procedure: CYSTO, BILATERAL RETROGRADE PYELOGRAM WITH BLADDER BIOPSY;  Surgeon: [name], MD;  Location: [facility];  Service:  Urology;  Laterality: Bilateral;  . CYSTOSCOPY WITH STENT PLACEMENT Left 04/13/2013   Procedure:  LEFT URETERAL STENT PLACEMENT;  Surgeon: [name], MD;  Location: [facility];  Service: Urology;  Laterality: Left;  . EYE SURGERY     bilateral cataracts with lens implants  . TRANSURETHRAL RESECTION OF BLADDER TUMOR N/A 06/04/2013   Procedure: RESTAGING TRANSURETHRAL RESECTION OF BLADDER TUMOR (TURBT), URETERAL CATHERIZATION ;  Surgeon: [name], MD;  Location: [facility];  Service: Urology;  Laterality: N/A;  CYSTO RESTAGING TURBT     . TRANSURETHRAL RESECTION OF BLADDER TUMOR N/A 09/17/2013   Procedure: TRANSURETHRAL RESECTION OF BLADDER TUMOR WITH MITOMYCIN-C;  Surgeon: [name], MD;  Location: [facility];  Service: Urology;  Laterality: N/A;  . TRANSURETHRAL RESECTION OF BLADDER TUMOR WITH GYRUS (TURBT-GYRUS) N/A 04/13/2013   Procedure: TRANSURETHRAL RESECTION OF BLADDER TUMOR WITH GYRUS (TURBT-GYRUS);  Surgeon: [name], MD;  Location: [facility];  Service: Urology;  Laterality: N/A;  . US ECHOCARDIOGRAPHY  08/31/10   EF 55-60%    FAMILY HISTORY: Family History  Problem Relation Age of Onset  . COPD Father   . Stroke Father   . Parkinson's disease Brother 43  . Heart failure Mother   . Bowel Disease Sister 65    SOCIAL HISTORY: Social History   Socioeconomic History  . Marital status: Married    Spouse name: Not on file  . Number of children: 2  . Years of education:  College  . Highest education level: Not on file  Social Needs  . Financial resource strain: Not on file  . Food insecurity - worry: Not on file  . Food insecurity - inability: Not on file  . Transportation needs - medical: Not on file  . Transportation needs - non-medical: Not on file  Occupational History  . Occupation: retired Hotel manager   Tobacco Use  . Smoking status: Former Smoker    Types: Pipe    Last attempt to quit: 07/04/1981    Years since quitting: 36.0  . Smokeless tobacco: Never Used  Substance and Sexual Activity  . Alcohol use: Yes    Alcohol/week: 0.6 oz    Types: 1 Glasses of wine per week  . Drug use: No  . Sexual activity: Not Currently  Other Topics Concern  . Not on file  Social History Narrative   [facility] 03/13/2016   Patient lives at home with his wife has 2 children.   Married 1960.   Patient is left handed.   Patient has college education.   Patient drink 4 cups daily.   Smoked pipe 20-35 years    Alcohol 1 glass of wine per day    Exercise no         PHYSICAL EXAM  Vitals:   07/04/17 1100  BP: (!) 111/57  Pulse: 71   There is no height or weight on file to calculate BMI.     MMSE - Mini Mental State Exam 07/04/2017 12/18/2016 06/07/2016  Not completed: - (No Data) -  Orientation to time 0 1 1  Orientation to Place 1 0 3  Registration 1 0 3  Attention/ Calculation 0 0 0  Recall 0 0 0  Language- name 2 objects 1 2 2   Language- repeat 0 0 1  Language- follow 3 step command 2 2 3   Language- read & follow direction 0 1 1  Write a sentence 0 0 1  Copy design 0 0 1  Total  score 5 6 16      Generalized: Well developed, in no acute distress   Neurological examination  Mentation: Alert.  The patient follows commands intermittently.  Speech is limited and slightly dysarthric Cranial nerve II-XII: Pupils were equal round reactive to light. Extraocular movements were full, visual field were full on confrontational test.  Facial sensation and strength were normal. Uvula tongue midline. Head turning and shoulder shrug  were normal and symmetric. Motor: The motor testing reveals 5 over 5 strength of all 4 extremities. Good symmetric motor tone is noted throughout.  Sensory: Sensory testing is intact to soft touch on all 4 extremities. No evidence of extinction is noted.  Coordination:  Unable to test patient would not complete. Gait and station: Patient is in a wheelchair. Reflexes: Deep tendon reflexes are symmetric and normal bilaterally.   DIAGNOSTIC DATA (LABS, IMAGING, TESTING) - I reviewed patient records, labs, notes, testing and imaging myself where available.  Lab Results  Component Value Date   WBC 6.1 03/18/2017   HGB 11.8 (L) 03/18/2017   HCT 34.9 (L) 03/18/2017   MCV 91.8 03/18/2017   PLT 180 03/18/2017      Component Value Date/Time   NA 142 03/20/2017 0814   K 3.4 (L) 03/20/2017 0814   CL 106 03/20/2017 0814   CO2 24 03/20/2017 0814   GLUCOSE 83 03/20/2017 0814   BUN 16 03/20/2017 0814   CREATININE 1.28 (H) 03/20/2017 0814   CALCIUM 8.8 03/20/2017 0814   PROT 6.6 03/20/2017 0814   ALBUMIN 3.5 (L) 03/20/2017 0814   AST 10 03/20/2017 0814   ALT 4 (L) 03/20/2017 0814   ALKPHOS 63 03/20/2017 0814   BILITOT 0.5 03/20/2017 0814   GFRNONAA 44 (L) 03/18/2017 1220   GFRNONAA 49 (L) 03/11/2017 0830   GFRAA 51 (L) 03/18/2017 1220   GFRAA 57 (L) 03/11/2017 0830   Lab Results  Component Value Date   CHOL 167 09/24/2016   HDL 46 09/24/2016   LDLCALC 98 09/24/2016   TRIG 114 09/24/2016   CHOLHDL 3.6 09/24/2016   Lab Results  Component Value Date   HGBA1C 5.2 08/19/2013   Lab Results  Component Value Date   VITAMINB12 1,314 (H) 11/17/2013   Lab Results  Component Value Date   TSH 2.55 02/13/2017      ASSESSMENT AND PLAN 83 y.o. year old male  has a past medical history of Abnormality of gait (06/07/2016), Anemia, Aortic stenosis, Arthritis, Balance problem, Cancer ([place]),  Chronic kidney disease, Complication of anesthesia, Dementia, Fall, GERD (gastroesophageal reflux disease), GI bleed, Glaucoma, Glaucoma, Heart murmur, History of blood transfusion (LAST 2-3 YRS AGO), History of cerebral infarction (08/19/2013), Hypercholesteremia, Hyperlipemia, Hypertension, Mitral regurgitation, PONV (postoperative nausea and vomiting), Shingles (May 2013), Stroke [facility]), and Transfusion history. here with:   1.  Memory disturbance  The patient's memory score has remained the same.  The patient will continue on Celexa 10 mg daily.  His family reports that his mood has also remained stable.  For now we will continue to monitor his symptoms.  If his symptoms worsen or he develops new symptoms they should let us know.  He will follow-up in 6 months or sooner if needed.  I spent 15 minutes with the patient. 50% of this time was spent discussing his memory score.      Ward Givens, MSN, NP-C 07/04/2017, 10:59 AM [facility] [address], [place], [place] [address] [phone]

## 2017-07-11 ENCOUNTER — Telehealth: Payer: Self-pay | Admitting: Internal Medicine

## 2017-07-11 NOTE — Telephone Encounter (Signed)
I left a message asking the pt to confirm AWV-I at [facility] clinic on 07/12/17 at 12:45. VDM (DD)

## 2017-07-17 ENCOUNTER — Encounter: Payer: Self-pay | Admitting: Internal Medicine

## 2017-07-17 ENCOUNTER — Non-Acute Institutional Stay: Payer: Medicare Other | Admitting: Internal Medicine

## 2017-07-17 VITALS — BP 134/70 | HR 70 | Resp 18 | Ht 67.0 in | Wt 140.0 lb

## 2017-07-17 DIAGNOSIS — R1310 Dysphagia, unspecified: Secondary | ICD-10-CM | POA: Diagnosis not present

## 2017-07-17 DIAGNOSIS — F02818 Dementia in other diseases classified elsewhere, unspecified severity, with other behavioral disturbance: Secondary | ICD-10-CM

## 2017-07-17 DIAGNOSIS — Z8673 Personal history of transient ischemic attack (TIA), and cerebral infarction without residual deficits: Secondary | ICD-10-CM | POA: Diagnosis not present

## 2017-07-17 DIAGNOSIS — I1310 Hypertensive heart and chronic kidney disease without heart failure, with stage 1 through stage 4 chronic kidney disease, or unspecified chronic kidney disease: Secondary | ICD-10-CM

## 2017-07-17 DIAGNOSIS — F0281 Dementia in other diseases classified elsewhere with behavioral disturbance: Secondary | ICD-10-CM

## 2017-07-17 DIAGNOSIS — D500 Iron deficiency anemia secondary to blood loss (chronic): Secondary | ICD-10-CM

## 2017-07-17 DIAGNOSIS — G301 Alzheimer's disease with late onset: Secondary | ICD-10-CM

## 2017-07-17 DIAGNOSIS — N182 Chronic kidney disease, stage 2 (mild): Secondary | ICD-10-CM | POA: Diagnosis not present

## 2017-07-17 NOTE — Progress Notes (Signed)
[facility]  Provider: Blanchie Serve MD   Location:  [place] of Service:  Clinic (12)  PCP: Blanchie Serve, MD Patient Care Team: Blanchie Serve, MD as PCP - General (Internal Medicine) Ronald Lobo, MD (Gastroenterology) Rolm Bookbinder, MD as Consulting Physician (Dermatology) Nahser, [name], MD as Consulting Physician (Cardiology) Kathrynn Ducking, MD as Consulting Physician (Neurology) [name], MD as Attending Physician (Urology) Luberta Mutter, MD as Consulting Physician (Ophthalmology) Blanchie Serve, MD as Consulting Physician (Internal Medicine)  Extended Emergency Contact Information Primary Emergency Contact: [facility] Address: [address]          [place], [place] [address] [name] of [place] Phone: [phone] Mobile Phone: [phone] Relation: Spouse Secondary Emergency Contact: [place] , [place] Phone: [phone] Mobile Phone: [phone] Relation: Son   Goals of Care: Advanced Directive information Advanced Directives 04/11/2017  Does Patient Have a Medical Advance Directive? Yes  Type of Advance Directive Out of facility DNR (pink MOST or yellow form);Healthcare Power of Attorney  Does patient want to make changes to medical advance directive? -  Copy of [place] in Chart? Yes  Pre-existing out of facility DNR order (yellow form or pink MOST form) Yellow form placed in chart (order not valid for inpatient use)      Chief Complaint  Patient presents with  . Medical Management of Chronic Issues    3 month follow up. Patients wife still has some concerns about his diarrhea. She stated that they followed up with Dr. Carma Leaven and he cant figure out why.   . Medication Refill    No refills needed at this time    HPI: Patient is a 83 y.o. male seen today for routine visit.   Hypertension- takes norvasc 5  mg daily. Tolerating well. No fall reported.   History of CVA- on antihypertensive. Not on statin with his age and dementia.   Diarrhea- continues, on loperamide 2 mg daily. Seen by GI with no further recommendations.    Advanced dementia with behavioral disturbance- tolerating celexa 20 mg daily well. Also on alprazolam 0.25 mg am and noon and 0.5 mg qhs. There are episodes of agitation/ restlessness but overall stable. Wheelchair bound. Feeds himself after direction and meal tray set up.   Dysphagia- with food and pills. Advanced dementia.   Past Medical History:  Diagnosis Date  . Abnormality of gait 06/07/2016  . Anemia    from AVM's. Requiring transfusion  . Aortic stenosis    s/p TAVI July 2012  . Arthritis   . Balance problem    WEAK ON RIGHT SIDE ( SCOLISOSIS AND HX OF A BACK SURGERY) - BALANCE PROBLEM - "FALLS BACKWARD" - FREQUENT FALLS.  USES CANE WHEN AMBULATING  . Cancer (HCC)    BLADDER CANCER  . Chronic kidney disease   . Complication of anesthesia    VERY DROWSY FOR HOURS AFTER SURGERY - AND DISORIENTATION  . Dementia    more memory problems from stroke  . Fall   . GERD (gastroesophageal reflux disease)    RARE - AND NO MEDS  . GI bleed   . Glaucoma   . Glaucoma   . Heart murmur    PT'S CARDIOLOGIST IS DR. Acie Fredrickson -LAST OFFICE VISIT 03/02/13 IN [facility]  . History of blood transfusion LAST 2-3 YRS AGO   HAD 4  OR 5   . History of cerebral infarction 08/19/2013  . Hypercholesteremia   . Hyperlipemia   . Hypertension    Not taking Lisinopril due to BP drops low.  . Memory loss   . Mitral regurgitation   . PONV (postoperative nausea and vomiting)    IN PAST, DID WELL WITH LAST SURGERY  . Shingles May 2013   right arm residual pain- last outbreak 1 1/2 yrs ago.  . Stroke ([facility])    left internal capsule 1/15  . Transfusion history    last 2 yrs ago.   Past Surgical History:  Procedure Laterality Date  . AORTIC VALVE REPLACEMENT     02/2011([facility])  .  APPENDECTOMY    . BACK SURGERY  2010 OR 2011   LOWER  . CARDIAC CATHETERIZATION  10/23/2010   ef 65%  . CARDIOVASCULAR STRESS TEST  01/01/2007   EF 64%, NO EVIDENCE OF ISCHEMIA  . CYSTOSCOPY W/ RETROGRADES Bilateral 09/17/2013   Procedure: CYSTOSCOPY WITH BILATERAL RETROGRADE;  Surgeon: [name], MD;  Location: [facility];  Service: Urology;  Laterality: Bilateral;  . CYSTOSCOPY W/ RETROGRADES Bilateral 11/15/2015   Procedure: CYSTO, BILATERAL RETROGRADE PYELOGRAM WITH BLADDER BIOPSY;  Surgeon: [name], MD;  Location: [facility];  Service: Urology;  Laterality: Bilateral;  . CYSTOSCOPY WITH STENT PLACEMENT Left 04/13/2013   Procedure:  LEFT URETERAL STENT PLACEMENT;  Surgeon: [name], MD;  Location: [facility];  Service: Urology;  Laterality: Left;  . EYE SURGERY     bilateral cataracts with lens implants  . TRANSURETHRAL RESECTION OF BLADDER TUMOR N/A 06/04/2013   Procedure: RESTAGING TRANSURETHRAL RESECTION OF BLADDER TUMOR (TURBT), URETERAL CATHERIZATION ;  Surgeon: [name], MD;  Location: [facility];  Service: Urology;  Laterality: N/A;  CYSTO RESTAGING TURBT     . TRANSURETHRAL RESECTION OF BLADDER TUMOR N/A 09/17/2013   Procedure: TRANSURETHRAL RESECTION OF BLADDER TUMOR WITH MITOMYCIN-C;  Surgeon: [name], MD;  Location: [facility];  Service: Urology;  Laterality: N/A;  . TRANSURETHRAL RESECTION OF BLADDER TUMOR WITH GYRUS (TURBT-GYRUS) N/A 04/13/2013   Procedure: TRANSURETHRAL RESECTION OF BLADDER TUMOR WITH GYRUS (TURBT-GYRUS);  Surgeon: [name], MD;  Location: [facility];  Service: Urology;  Laterality: N/A;  . US ECHOCARDIOGRAPHY  08/31/10   EF 55-60%    reports that he quit smoking about 36 years ago. His smoking use included pipe. he has never used smokeless tobacco. He reports that he drinks about 0.6 oz of alcohol per week. He reports that he does not use drugs. Social History   Socioeconomic History  . Marital status: Married    Spouse name: Not on file  . Number of children: 2  .  Years of education: College  . Highest education level: Not on file  Social Needs  . Financial resource strain: Not on file  . Food insecurity - worry: Not on file  . Food insecurity - inability: Not on file  . Transportation needs - medical: Not on file  . Transportation needs - non-medical: Not on file  Occupational History  . Occupation: retired Hotel manager   Tobacco Use  . Smoking status: Former Smoker    Types: Pipe    Last attempt to quit: 07/04/1981    Years since quitting: 36.0  . Smokeless tobacco: Never Used  Substance and Sexual Activity  . Alcohol use: Yes    Alcohol/week: 0.6 oz    Types: 1 Glasses of wine per week  . Drug use: No  .  Sexual activity: Not Currently  Other Topics Concern  . Not on file  Social History Narrative   Lives at [facility]/13/[facility]   Patient lives at home with his wife has 2 children.   Married 1960.   Patient is left handed.   Patient has college education.   Patient drink 4 cups daily.   Smoked pipe 20-35 years    Alcohol 1 glass of wine per day    Exercise no       Functional Status Survey:    Family History  Problem Relation Age of Onset  . COPD Father   . Stroke Father   . Parkinson's disease Brother 31  . Heart failure Mother   . Bowel Disease Sister 48    Health Maintenance  Topic Date Due  . PNA vac Low Risk Adult (2 of 2 - PCV13) 08/06/2017 (Originally 03/06/2012)  . TETANUS/TDAP  06/01/2026  . INFLUENZA VACCINE  Completed    Allergies  Allergen Reactions  . Advil [Ibuprofen] Other (See Comments)    Causes stomach bleeding, need transfusions as result  . Aspirin Other (See Comments)    Causes stomach bleeding, needs transfusion as result  . Penicillins Itching and Swelling    Has patient had a PCN reaction causing immediate rash, facial/tongue/throat swelling, SOB or lightheadedness with hypotension: Unsure Has patient had a PCN reaction causing severe rash involving mucus membranes or skin necrosis:  Unsure  Has patient had a PCN reaction that required hospitalization Yes Has patient had a PCN reaction occurring within the last 10 years: No If all of the above answers are "NO", then may proceed with Cephalosporin use.  . Aricept [Donepezil]     Listed on face sheet  . Atorvastatin     Joint ache  . Famciclovir     Listed on face sheet  . Namenda [Memantine]     Stomach pain  . Other Other (See Comments)    No blood thinners due to GI bleed.  . Ativan [Lorazepam] Anxiety    He became very anxious and aggitated    Outpatient Encounter Medications as of 07/17/2017  Medication Sig  . ALPRAZolam (XANAX) 0.5 MG tablet Take 0.25-0.5 mg by mouth 3 (three) times daily. 1/2 tablet in the morning and at noon. And 1 tablet at bedtime   . amLODipine (NORVASC) 5 MG tablet Take 5 mg by mouth daily.  . citalopram (CELEXA) 20 MG tablet Take 10 mg by mouth daily.  . iron polysaccharides (NIFEREX) 150 MG capsule Take 150 mg by mouth every morning.   . latanoprost (XALATAN) 0.005 % ophthalmic solution Place 1 drop into both eyes at bedtime.  Marland Kitchen loperamide (IMODIUM A-D) 2 MG tablet Take 2 mg by mouth at bedtime.  Marland Kitchen UNABLE TO FIND Med Name: Tylenol liquid 1 tablespoons 2-3 times to day.  . [DISCONTINUED] loperamide (IMODIUM A-D) 2 MG tablet Take 1 tablet (2 mg total) by mouth 4 (four) times daily as needed for diarrhea or loose stools.  . cholecalciferol (VITAMIN D) 1000 units tablet Take 1,000 Units by mouth daily.  . Multiple Vitamin (MULTIVITAMIN) tablet Take 1 tablet by mouth daily.  . [DISCONTINUED] ALPRAZolam (XANAX) 0.5 MG tablet Take one tablet by mouth every 8 hours as needed for severe anxiety  . [DISCONTINUED] amLODipine (NORVASC) 5 MG tablet Take 1 tablet (5 mg total) by mouth daily.  . [DISCONTINUED] citalopram (CELEXA) 10 MG tablet Take 1 tablet (10 mg total) by mouth daily.  . [DISCONTINUED] vitamin  B-12 (CYANOCOBALAMIN) 1000 MCG tablet Take 1,000 mcg by mouth daily.   No  facility-administered encounter medications on file as of 07/17/2017.     Review of Systems  Unable to perform ROS: Dementia (obtained from wife and caregiver)  Constitutional: Negative for appetite change and fever.  HENT: Positive for trouble swallowing. Negative for congestion and mouth sores.        With pills and with food  Respiratory: Negative for cough, shortness of breath and wheezing.   Cardiovascular: Negative for leg swelling.  Gastrointestinal: Positive for diarrhea. Negative for abdominal pain and vomiting.       Ongoing episodes of loose stool to Semi formed to formed stool with 1-3 episodes a day  Genitourinary:       Has urinary incontinence and bowel incontinence  Musculoskeletal: Positive for gait problem.       Wheelchair bound  Skin: Negative for wound.  Neurological: Negative for dizziness and headaches.  Hematological: Bruises/bleeds easily.  Psychiatric/Behavioral: Positive for behavioral problems and confusion.    Vitals:   07/17/17 1210  BP: 134/70  Pulse: 70  Resp: 18  SpO2: 95%  Weight: 140 lb (63.5 kg)  Height: _0  (1.702 m)   Body mass index is 21.93 kg/m.   Wt Readings from Last 3 Encounters:  07/17/17 140 lb (63.5 kg)  04/11/17 148 lb (67.1 kg)  03/18/17 152 lb (68.9 kg)   Physical Exam  Constitutional: No distress.  Thin built, elderly male in no acute distress  HENT:  Head: Normocephalic and atraumatic.  Mouth/Throat: Oropharynx is clear and moist.  Eyes: Pupils are equal, round, and reactive to light.  Neck: Neck supple.  Cardiovascular: Normal rate and regular rhythm.  Pulmonary/Chest: Effort normal and breath sounds normal.  Abdominal: Soft. Bowel sounds are normal. There is no tenderness.  Musculoskeletal: He exhibits deformity. He exhibits no edema.  Wheelchair bound, needs assistance with transfers  Lymphadenopathy:    He has no cervical adenopathy.  Neurological:  pleasantly confused, sleeping during conversation. Does  not participate in conversation.  Skin: Skin is warm and dry. No rash noted. He is not diaphoretic.    Labs reviewed: Basic Metabolic Panel: Recent Labs    03/11/17 0830 03/18/17 1220 03/20/17 0814  NA 140 139 142  K 3.4* 3.8 3.4*  CL 103 105 106  CO2 _1 GLUCOSE 84 93 83  BUN _2 CREATININE 1.24* 1.33* 1.28*  CALCIUM 8.9 8.8* 8.8  MG 2.0  --  2.0   Liver Function Tests: Recent Labs    03/11/17 0830 03/18/17 1220 03/20/17 0814  AST _3 ALT 8* 5* 4*  ALKPHOS 66 59 63  BILITOT 0.5 0.7 0.5  PROT 7.3 7.4 6.6  ALBUMIN 3.9 3.4* 3.5*   No results for input(s): LIPASE, AMYLASE in the last 8760 hours. No results for input(s): AMMONIA in the last 8760 hours. CBC: Recent Labs    12/06/16 1023 12/06/16 1038 02/13/17 1115 03/18/17 1220  WBC 9.7  --  5.1 6.1  NEUTROABS 8.6*  --  3,927 5.0  HGB 11.1* 10.9* 11.6* 11.8*  HCT 33.6* 32.0* 34.1* 34.9*  MCV 94.9  --  93.2 91.8  PLT 144*  --  216 180   Cardiac Enzymes: No results for input(s): CKTOTAL, CKMB, CKMBINDEX, TROPONINI in the last 8760 hours. BNP: Invalid input(s): POCBNP Lab Results  Component Value Date   HGBA1C 5.2 08/19/2013   Lab Results  Component Value Date  TSH 2.55 02/13/2017   Lab Results  Component Value Date   VITAMINB12 1,314 (H) 11/17/2013   No results found for: FOLATE No results found for: IRON, TIBC, FERRITIN  Lipid Panel: Recent Labs    09/24/16 0001  CHOL 167  HDL 46  LDLCALC 98  TRIG 114  CHOLHDL 3.6   Lab Results  Component Value Date   HGBA1C 5.2 08/19/2013    Procedures since last visit: No results found.  Assessment/Plan  1. Hypertensive heart and kidney disease without HF and with CKD stage II Continue norvasc - CMP with eGFR; Future  2. Late onset Alzheimer's disease with behavioral disturbance Continue celexa 10 mg daily and provide supportive care. Advised on transfer to SNF given needs for increased assistance with his ADLs. Reviewed with  wife goals of care. She is to bring [facility] next visit for review.   3. History of cerebral infarction On antihypertensives. Supportive care  4. Iron deficiency anemia due to chronic blood loss Continue iron supplement with vit c for now - CMP with eGFR; Future - CBC (no diff); Future  5. Dysphagia, unspecified type With his advanced dementia, high risk for aspiration, SLP consult.     Labs/tests ordered:   Lab Orders     CMP with eGFR     CBC (no diff)  Next appointment: 4 months  Communication: reviewed care plan with patient's wife and caregiver    Blanchie Serve, MD Internal Medicine [place], [place] [address] Cell Phone (Monday-Friday 8 am - 5 pm): [phone] On Call: [phone] and follow prompts after 5 pm and on weekends Office Phone: [phone] Office Fax: [phone]

## 2017-07-17 NOTE — Patient Instructions (Signed)
  Take celexa 10 mg daily only. Stop taking 20 mg daily dosing.

## 2017-07-23 ENCOUNTER — Telehealth: Payer: Self-pay | Admitting: *Deleted

## 2017-07-23 NOTE — Telephone Encounter (Signed)
Thanks. Message left notifying wife per request this AM.

## 2017-07-23 NOTE — Telephone Encounter (Signed)
Patient has anemia of chronic disease with his history of chronic kidney disease. His lab work from august shows borderline low hemoglobin but stable. I have already placed in order to have it done prior to his next office visit. This should be good for now especially with him not having any symptoms.

## 2017-07-23 NOTE — Telephone Encounter (Signed)
Patient's wife walked in to the clinic and asked if he need to have his blood work sooner than April due to his Hx of low Hgb. She said he hasn't had labs since August. He doesn't appear to be having any symptoms, but didn't know if he should wait that length of time between checks.

## 2017-07-26 DIAGNOSIS — F0390 Unspecified dementia without behavioral disturbance: Secondary | ICD-10-CM | POA: Diagnosis not present

## 2017-07-26 DIAGNOSIS — R296 Repeated falls: Secondary | ICD-10-CM | POA: Diagnosis not present

## 2017-07-26 DIAGNOSIS — N401 Enlarged prostate with lower urinary tract symptoms: Secondary | ICD-10-CM | POA: Diagnosis not present

## 2017-07-26 DIAGNOSIS — N184 Chronic kidney disease, stage 4 (severe): Secondary | ICD-10-CM | POA: Diagnosis not present

## 2017-07-26 DIAGNOSIS — R1312 Dysphagia, oropharyngeal phase: Secondary | ICD-10-CM | POA: Diagnosis not present

## 2017-07-26 DIAGNOSIS — E638 Other specified nutritional deficiencies: Secondary | ICD-10-CM | POA: Diagnosis not present

## 2017-07-26 DIAGNOSIS — Z952 Presence of prosthetic heart valve: Secondary | ICD-10-CM | POA: Diagnosis not present

## 2017-07-26 DIAGNOSIS — I69991 Dysphagia following unspecified cerebrovascular disease: Secondary | ICD-10-CM | POA: Diagnosis not present

## 2017-07-26 DIAGNOSIS — I10 Essential (primary) hypertension: Secondary | ICD-10-CM | POA: Diagnosis not present

## 2017-07-26 DIAGNOSIS — G468 Other vascular syndromes of brain in cerebrovascular diseases: Secondary | ICD-10-CM | POA: Diagnosis not present

## 2017-07-26 DIAGNOSIS — E7800 Pure hypercholesterolemia, unspecified: Secondary | ICD-10-CM | POA: Diagnosis not present

## 2017-07-26 DIAGNOSIS — C679 Malignant neoplasm of bladder, unspecified: Secondary | ICD-10-CM | POA: Diagnosis not present

## 2017-07-26 DIAGNOSIS — R42 Dizziness and giddiness: Secondary | ICD-10-CM | POA: Diagnosis not present

## 2017-07-26 DIAGNOSIS — D509 Iron deficiency anemia, unspecified: Secondary | ICD-10-CM | POA: Diagnosis not present

## 2017-08-01 DIAGNOSIS — F0390 Unspecified dementia without behavioral disturbance: Secondary | ICD-10-CM | POA: Diagnosis not present

## 2017-08-01 DIAGNOSIS — R296 Repeated falls: Secondary | ICD-10-CM | POA: Diagnosis not present

## 2017-08-01 DIAGNOSIS — I69991 Dysphagia following unspecified cerebrovascular disease: Secondary | ICD-10-CM | POA: Diagnosis not present

## 2017-08-01 DIAGNOSIS — Z952 Presence of prosthetic heart valve: Secondary | ICD-10-CM | POA: Diagnosis not present

## 2017-08-01 DIAGNOSIS — R1312 Dysphagia, oropharyngeal phase: Secondary | ICD-10-CM | POA: Diagnosis not present

## 2017-08-01 DIAGNOSIS — G468 Other vascular syndromes of brain in cerebrovascular diseases: Secondary | ICD-10-CM | POA: Diagnosis not present

## 2017-08-05 DIAGNOSIS — I69991 Dysphagia following unspecified cerebrovascular disease: Secondary | ICD-10-CM | POA: Diagnosis not present

## 2017-08-05 DIAGNOSIS — Z952 Presence of prosthetic heart valve: Secondary | ICD-10-CM | POA: Diagnosis not present

## 2017-08-05 DIAGNOSIS — R1312 Dysphagia, oropharyngeal phase: Secondary | ICD-10-CM | POA: Diagnosis not present

## 2017-08-05 DIAGNOSIS — G468 Other vascular syndromes of brain in cerebrovascular diseases: Secondary | ICD-10-CM | POA: Diagnosis not present

## 2017-08-05 DIAGNOSIS — F0390 Unspecified dementia without behavioral disturbance: Secondary | ICD-10-CM | POA: Diagnosis not present

## 2017-08-05 DIAGNOSIS — R296 Repeated falls: Secondary | ICD-10-CM | POA: Diagnosis not present

## 2017-08-06 DIAGNOSIS — F0390 Unspecified dementia without behavioral disturbance: Secondary | ICD-10-CM | POA: Diagnosis not present

## 2017-08-06 DIAGNOSIS — Z952 Presence of prosthetic heart valve: Secondary | ICD-10-CM | POA: Diagnosis not present

## 2017-08-06 DIAGNOSIS — I69991 Dysphagia following unspecified cerebrovascular disease: Secondary | ICD-10-CM | POA: Diagnosis not present

## 2017-08-06 DIAGNOSIS — E7800 Pure hypercholesterolemia, unspecified: Secondary | ICD-10-CM | POA: Diagnosis not present

## 2017-08-06 DIAGNOSIS — E638 Other specified nutritional deficiencies: Secondary | ICD-10-CM | POA: Diagnosis not present

## 2017-08-06 DIAGNOSIS — R42 Dizziness and giddiness: Secondary | ICD-10-CM | POA: Diagnosis not present

## 2017-08-06 DIAGNOSIS — I10 Essential (primary) hypertension: Secondary | ICD-10-CM | POA: Diagnosis not present

## 2017-08-06 DIAGNOSIS — D509 Iron deficiency anemia, unspecified: Secondary | ICD-10-CM | POA: Diagnosis not present

## 2017-08-06 DIAGNOSIS — R1312 Dysphagia, oropharyngeal phase: Secondary | ICD-10-CM | POA: Diagnosis not present

## 2017-08-06 DIAGNOSIS — N401 Enlarged prostate with lower urinary tract symptoms: Secondary | ICD-10-CM | POA: Diagnosis not present

## 2017-08-06 DIAGNOSIS — G468 Other vascular syndromes of brain in cerebrovascular diseases: Secondary | ICD-10-CM | POA: Diagnosis not present

## 2017-08-06 DIAGNOSIS — C679 Malignant neoplasm of bladder, unspecified: Secondary | ICD-10-CM | POA: Diagnosis not present

## 2017-08-06 DIAGNOSIS — R296 Repeated falls: Secondary | ICD-10-CM | POA: Diagnosis not present

## 2017-08-06 DIAGNOSIS — N184 Chronic kidney disease, stage 4 (severe): Secondary | ICD-10-CM | POA: Diagnosis not present

## 2017-08-08 ENCOUNTER — Other Ambulatory Visit: Payer: Self-pay | Admitting: *Deleted

## 2017-08-08 DIAGNOSIS — I69991 Dysphagia following unspecified cerebrovascular disease: Secondary | ICD-10-CM | POA: Diagnosis not present

## 2017-08-08 DIAGNOSIS — F0390 Unspecified dementia without behavioral disturbance: Secondary | ICD-10-CM | POA: Diagnosis not present

## 2017-08-08 DIAGNOSIS — R296 Repeated falls: Secondary | ICD-10-CM | POA: Diagnosis not present

## 2017-08-08 DIAGNOSIS — R1312 Dysphagia, oropharyngeal phase: Secondary | ICD-10-CM | POA: Diagnosis not present

## 2017-08-08 DIAGNOSIS — G468 Other vascular syndromes of brain in cerebrovascular diseases: Secondary | ICD-10-CM | POA: Diagnosis not present

## 2017-08-08 DIAGNOSIS — Z952 Presence of prosthetic heart valve: Secondary | ICD-10-CM | POA: Diagnosis not present

## 2017-08-08 MED ORDER — ALPRAZOLAM 0.5 MG PO TABS: ORAL_TABLET | ORAL | 0 refills | Status: DC

## 2017-08-08 NOTE — Telephone Encounter (Signed)
Patient wife called and requested

## 2017-08-12 DIAGNOSIS — Z952 Presence of prosthetic heart valve: Secondary | ICD-10-CM | POA: Diagnosis not present

## 2017-08-12 DIAGNOSIS — I69991 Dysphagia following unspecified cerebrovascular disease: Secondary | ICD-10-CM | POA: Diagnosis not present

## 2017-08-12 DIAGNOSIS — R296 Repeated falls: Secondary | ICD-10-CM | POA: Diagnosis not present

## 2017-08-12 DIAGNOSIS — F0390 Unspecified dementia without behavioral disturbance: Secondary | ICD-10-CM | POA: Diagnosis not present

## 2017-08-12 DIAGNOSIS — R1312 Dysphagia, oropharyngeal phase: Secondary | ICD-10-CM | POA: Diagnosis not present

## 2017-08-12 DIAGNOSIS — G468 Other vascular syndromes of brain in cerebrovascular diseases: Secondary | ICD-10-CM | POA: Diagnosis not present

## 2017-08-14 DIAGNOSIS — I69991 Dysphagia following unspecified cerebrovascular disease: Secondary | ICD-10-CM | POA: Diagnosis not present

## 2017-08-14 DIAGNOSIS — R296 Repeated falls: Secondary | ICD-10-CM | POA: Diagnosis not present

## 2017-08-14 DIAGNOSIS — R1312 Dysphagia, oropharyngeal phase: Secondary | ICD-10-CM | POA: Diagnosis not present

## 2017-08-14 DIAGNOSIS — Z952 Presence of prosthetic heart valve: Secondary | ICD-10-CM | POA: Diagnosis not present

## 2017-08-14 DIAGNOSIS — F0390 Unspecified dementia without behavioral disturbance: Secondary | ICD-10-CM | POA: Diagnosis not present

## 2017-08-14 DIAGNOSIS — G468 Other vascular syndromes of brain in cerebrovascular diseases: Secondary | ICD-10-CM | POA: Diagnosis not present

## 2017-08-16 DIAGNOSIS — G468 Other vascular syndromes of brain in cerebrovascular diseases: Secondary | ICD-10-CM | POA: Diagnosis not present

## 2017-08-16 DIAGNOSIS — R1312 Dysphagia, oropharyngeal phase: Secondary | ICD-10-CM | POA: Diagnosis not present

## 2017-08-16 DIAGNOSIS — I69991 Dysphagia following unspecified cerebrovascular disease: Secondary | ICD-10-CM | POA: Diagnosis not present

## 2017-08-16 DIAGNOSIS — F0390 Unspecified dementia without behavioral disturbance: Secondary | ICD-10-CM | POA: Diagnosis not present

## 2017-08-16 DIAGNOSIS — Z952 Presence of prosthetic heart valve: Secondary | ICD-10-CM | POA: Diagnosis not present

## 2017-08-16 DIAGNOSIS — R296 Repeated falls: Secondary | ICD-10-CM | POA: Diagnosis not present

## 2017-08-19 DIAGNOSIS — G468 Other vascular syndromes of brain in cerebrovascular diseases: Secondary | ICD-10-CM | POA: Diagnosis not present

## 2017-08-19 DIAGNOSIS — R296 Repeated falls: Secondary | ICD-10-CM | POA: Diagnosis not present

## 2017-08-19 DIAGNOSIS — R1312 Dysphagia, oropharyngeal phase: Secondary | ICD-10-CM | POA: Diagnosis not present

## 2017-08-19 DIAGNOSIS — F0390 Unspecified dementia without behavioral disturbance: Secondary | ICD-10-CM | POA: Diagnosis not present

## 2017-08-19 DIAGNOSIS — I69991 Dysphagia following unspecified cerebrovascular disease: Secondary | ICD-10-CM | POA: Diagnosis not present

## 2017-08-19 DIAGNOSIS — Z952 Presence of prosthetic heart valve: Secondary | ICD-10-CM | POA: Diagnosis not present

## 2017-08-20 DIAGNOSIS — G468 Other vascular syndromes of brain in cerebrovascular diseases: Secondary | ICD-10-CM | POA: Diagnosis not present

## 2017-08-20 DIAGNOSIS — R296 Repeated falls: Secondary | ICD-10-CM | POA: Diagnosis not present

## 2017-08-20 DIAGNOSIS — R1312 Dysphagia, oropharyngeal phase: Secondary | ICD-10-CM | POA: Diagnosis not present

## 2017-08-20 DIAGNOSIS — I69991 Dysphagia following unspecified cerebrovascular disease: Secondary | ICD-10-CM | POA: Diagnosis not present

## 2017-08-20 DIAGNOSIS — Z952 Presence of prosthetic heart valve: Secondary | ICD-10-CM | POA: Diagnosis not present

## 2017-08-20 DIAGNOSIS — F0390 Unspecified dementia without behavioral disturbance: Secondary | ICD-10-CM | POA: Diagnosis not present

## 2017-08-26 DIAGNOSIS — I69991 Dysphagia following unspecified cerebrovascular disease: Secondary | ICD-10-CM | POA: Diagnosis not present

## 2017-08-26 DIAGNOSIS — R1312 Dysphagia, oropharyngeal phase: Secondary | ICD-10-CM | POA: Diagnosis not present

## 2017-08-26 DIAGNOSIS — F0390 Unspecified dementia without behavioral disturbance: Secondary | ICD-10-CM | POA: Diagnosis not present

## 2017-08-26 DIAGNOSIS — G468 Other vascular syndromes of brain in cerebrovascular diseases: Secondary | ICD-10-CM | POA: Diagnosis not present

## 2017-08-26 DIAGNOSIS — Z952 Presence of prosthetic heart valve: Secondary | ICD-10-CM | POA: Diagnosis not present

## 2017-08-26 DIAGNOSIS — R296 Repeated falls: Secondary | ICD-10-CM | POA: Diagnosis not present

## 2017-09-03 DIAGNOSIS — R296 Repeated falls: Secondary | ICD-10-CM | POA: Diagnosis not present

## 2017-09-03 DIAGNOSIS — I69991 Dysphagia following unspecified cerebrovascular disease: Secondary | ICD-10-CM | POA: Diagnosis not present

## 2017-09-03 DIAGNOSIS — Z952 Presence of prosthetic heart valve: Secondary | ICD-10-CM | POA: Diagnosis not present

## 2017-09-03 DIAGNOSIS — F0390 Unspecified dementia without behavioral disturbance: Secondary | ICD-10-CM | POA: Diagnosis not present

## 2017-09-03 DIAGNOSIS — R1312 Dysphagia, oropharyngeal phase: Secondary | ICD-10-CM | POA: Diagnosis not present

## 2017-09-03 DIAGNOSIS — G468 Other vascular syndromes of brain in cerebrovascular diseases: Secondary | ICD-10-CM | POA: Diagnosis not present

## 2017-09-04 ENCOUNTER — Non-Acute Institutional Stay: Payer: Medicare Other | Admitting: Internal Medicine

## 2017-09-04 ENCOUNTER — Encounter: Payer: Self-pay | Admitting: Internal Medicine

## 2017-09-04 VITALS — BP 132/68 | HR 88 | Temp 97.4°F | Resp 16 | Ht 67.0 in | Wt 142.0 lb

## 2017-09-04 DIAGNOSIS — J069 Acute upper respiratory infection, unspecified: Secondary | ICD-10-CM | POA: Diagnosis not present

## 2017-09-04 MED ORDER — PROBIOTIC 250 MG PO CAPS: 250.0000 mg | ORAL_CAPSULE | Freq: Two times a day (BID) | ORAL | 0 refills | Status: DC

## 2017-09-04 MED ORDER — GUAIFENESIN 100 MG/5ML PO SOLN: 10.0000 mL | Freq: Three times a day (TID) | ORAL | 0 refills | Status: DC

## 2017-09-04 MED ORDER — DOXYCYCLINE HYCLATE 100 MG PO TABS: 100.0000 mg | ORAL_TABLET | Freq: Two times a day (BID) | ORAL | 0 refills | Status: DC

## 2017-09-04 NOTE — Patient Instructions (Signed)
Keep yourself hydrated. Take your antibiotic as prescribed.

## 2017-09-04 NOTE — Progress Notes (Signed)
[facility]  Provider: Blanchie Serve MD   Location:  [place] of Service:  Clinic (12)  PCP: Blanchie Serve, MD Patient Care Team: Blanchie Serve, MD as PCP - General (Internal Medicine) Ronald Lobo, MD (Gastroenterology) Rolm Bookbinder, MD as Consulting Physician (Dermatology) Nahser, [name], MD as Consulting Physician (Cardiology) Kathrynn Ducking, MD as Consulting Physician (Neurology) [name], MD as Attending Physician (Urology) Luberta Mutter, MD as Consulting Physician (Ophthalmology) Blanchie Serve, MD as Consulting Physician (Internal Medicine)  Extended Emergency Contact Information Primary Emergency Contact: [facility] Address: [address]          [place], [place] [address] [name] of [place] Phone: [phone] Mobile Phone: [phone] Relation: Spouse Secondary Emergency Contact: [place] , [place] Phone: [phone] Mobile Phone: [phone] Relation: Son   Goals of Care: Advanced Directive information Advanced Directives 04/11/2017  Does Patient Have a Medical Advance Directive? Yes  Type of Advance Directive Out of facility DNR (pink MOST or yellow form);Healthcare Power of Attorney  Does patient want to make changes to medical advance directive? -  Copy of [place] in Chart? Yes  Pre-existing out of facility DNR order (yellow form or pink MOST form) Yellow form placed in chart (order not valid for inpatient use)      Chief Complaint  Patient presents with  . Acute Visit    Cough x 5-6 days. Per patient's wife he had a horrible coughing spell. Patient coughed up some dark yellow mucus. He is currenttly taking Robitussin DM and liquid tylenol per wife.     HPI: Patient is a 84 y.o. male seen today for acute visit for cough. He has been coughing or 5-6 days. He has been bringing up dark yellow mucus. He  has been taking robitussin DM. Has runny nose with yellow mucus. He has been having chills and getting tylenol. No documented fever. Per wife, he has been sleeping for most part of the days recently and feels tired. He has advanced dementia and this limits his participation in HPI and ROS.   Past Medical History:  Diagnosis Date  . Abnormality of gait 06/07/2016  . Anemia    from AVM's. Requiring transfusion  . Aortic stenosis    s/p TAVI July 2012  . Arthritis   . Balance problem    WEAK ON RIGHT SIDE ( SCOLISOSIS AND HX OF A BACK SURGERY) - BALANCE PROBLEM - "FALLS BACKWARD" - FREQUENT FALLS.  USES CANE WHEN AMBULATING  . Cancer (HCC)    BLADDER CANCER  . Chronic kidney disease   . Complication of anesthesia    VERY DROWSY FOR HOURS AFTER SURGERY - AND DISORIENTATION  . Dementia    more memory problems from stroke  . Fall   . GERD (gastroesophageal reflux disease)    RARE - AND NO MEDS  . GI bleed   . Glaucoma   . Glaucoma   . Heart murmur    PT'S CARDIOLOGIST IS DR. Acie Fredrickson -LAST OFFICE VISIT 03/02/13 IN [facility]  . History of blood transfusion LAST 2-3 YRS AGO   HAD 4 OR 5   . History of cerebral infarction 08/19/2013  . Hypercholesteremia   . Hyperlipemia   . Hypertension    Not taking Lisinopril due to BP drops low.  . Memory loss   . Mitral regurgitation   .  PONV (postoperative nausea and vomiting)    IN PAST, DID WELL WITH LAST SURGERY  . Shingles May 2013   right arm residual pain- last outbreak 1 1/2 yrs ago.  . Stroke ([place])    left internal capsule 1/15  . Transfusion history    last 2 yrs ago.   Past Surgical History:  Procedure Laterality Date  . AORTIC VALVE REPLACEMENT     02/2011([facility])  . APPENDECTOMY    . BACK SURGERY  2010 OR 2011   LOWER  . CARDIAC CATHETERIZATION  10/23/2010   ef 65%  . CARDIOVASCULAR STRESS TEST  01/01/2007   EF 64%, NO EVIDENCE OF ISCHEMIA  . CYSTOSCOPY W/ RETROGRADES Bilateral 09/17/2013   Procedure: CYSTOSCOPY WITH BILATERAL  RETROGRADE;  Surgeon: [name], MD;  Location: [facility];  Service: Urology;  Laterality: Bilateral;  . CYSTOSCOPY W/ RETROGRADES Bilateral 11/15/2015   Procedure: CYSTO, BILATERAL RETROGRADE PYELOGRAM WITH BLADDER BIOPSY;  Surgeon: [name], MD;  Location: [facility];  Service: Urology;  Laterality: Bilateral;  . CYSTOSCOPY WITH STENT PLACEMENT Left 04/13/2013   Procedure:  LEFT URETERAL STENT PLACEMENT;  Surgeon: [name], MD;  Location: [facility];  Service: Urology;  Laterality: Left;  . EYE SURGERY     bilateral cataracts with lens implants  . TRANSURETHRAL RESECTION OF BLADDER TUMOR N/A 06/04/2013   Procedure: RESTAGING TRANSURETHRAL RESECTION OF BLADDER TUMOR (TURBT), URETERAL CATHERIZATION ;  Surgeon: [name], MD;  Location: [facility];  Service: Urology;  Laterality: N/A;  CYSTO RESTAGING TURBT     . TRANSURETHRAL RESECTION OF BLADDER TUMOR N/A 09/17/2013   Procedure: TRANSURETHRAL RESECTION OF BLADDER TUMOR WITH MITOMYCIN-C;  Surgeon: [name], MD;  Location: [facility];  Service: Urology;  Laterality: N/A;  . TRANSURETHRAL RESECTION OF BLADDER TUMOR WITH GYRUS (TURBT-GYRUS) N/A 04/13/2013   Procedure: TRANSURETHRAL RESECTION OF BLADDER TUMOR WITH GYRUS (TURBT-GYRUS);  Surgeon: [name], MD;  Location: [facility];  Service: Urology;  Laterality: N/A;  . US ECHOCARDIOGRAPHY  08/31/10   EF 55-60%    reports that he quit smoking about 36 years ago. His smoking use included pipe. he has never used smokeless tobacco. He reports that he drinks about 0.6 oz of alcohol per week. He reports that he does not use drugs. Social History   Socioeconomic History  . Marital status: Married    Spouse name: Not on file  . Number of children: 2  . Years of education: College  . Highest education level: Not on file  Social Needs  . Financial resource strain: Not on file  . Food insecurity - worry: Not on file  . Food insecurity - inability: Not on file  . Transportation needs - medical: Not on file  .  Transportation needs - non-medical: Not on file  Occupational History  . Occupation: retired Hotel manager   Tobacco Use  . Smoking status: Former Smoker    Types: Pipe    Last attempt to quit: 07/04/1981    Years since quitting: 36.1  . Smokeless tobacco: Never Used  Substance and Sexual Activity  . Alcohol use: Yes    Alcohol/week: 0.6 oz    Types: 1 Glasses of wine per week  . Drug use: No  . Sexual activity: Not Currently  Other Topics Concern  . Not on file  Social History Narrative   Lives at [facility] 03/13/[facility]   Patient lives at home with his wife has 2 children.   Married 1960.   Patient  is left handed.   Patient has college education.   Patient drink 4 cups daily.   Smoked pipe 20-35 years    Alcohol 1 glass of wine per day    Exercise no        Family History  Problem Relation Age of Onset  . COPD Father   . Stroke Father   . Parkinson's disease Brother 52  . Heart failure Mother   . Bowel Disease Sister 73    Health Maintenance  Topic Date Due  . PNA vac Low Risk Adult (2 of 2 - PCV13) 03/06/2012  . TETANUS/TDAP  06/01/2026  . INFLUENZA VACCINE  Completed    Allergies  Allergen Reactions  . Advil [Ibuprofen] Other (See Comments)    Causes stomach bleeding, need transfusions as result  . Aspirin Other (See Comments)    Causes stomach bleeding, needs transfusion as result  . Penicillins Itching and Swelling    Has patient had a PCN reaction causing immediate rash, facial/tongue/throat swelling, SOB or lightheadedness with hypotension: Unsure Has patient had a PCN reaction causing severe rash involving mucus membranes or skin necrosis: Unsure  Has patient had a PCN reaction that required hospitalization Yes Has patient had a PCN reaction occurring within the last 10 years: No If all of the above answers are "NO", then may proceed with Cephalosporin use.  . Aricept [Donepezil]     Listed on face sheet  . Atorvastatin     Joint ache  .  Famciclovir     Listed on face sheet  . Namenda [Memantine]     Stomach pain  . Other Other (See Comments)    No blood thinners due to GI bleed.  . Ativan [Lorazepam] Anxiety    He became very anxious and aggitated    Outpatient Encounter Medications as of 09/04/2017  Medication Sig  . ALPRAZolam (XANAX) 0.5 MG tablet Take 1/2 tablet by mouth in the morning and at noon. And take 1 tablet by mouth at bedtime  . amLODipine (NORVASC) 5 MG tablet Take 5 mg by mouth daily.  . cholecalciferol (VITAMIN D) 1000 units tablet Take 1,000 Units by mouth daily.  . citalopram (CELEXA) 20 MG tablet Take 10 mg by mouth daily.  . iron polysaccharides (NIFEREX) 150 MG capsule Take 150 mg by mouth every morning.   . latanoprost (XALATAN) 0.005 % ophthalmic solution Place 1 drop into both eyes at bedtime.  Marland Kitchen loperamide (IMODIUM A-D) 2 MG tablet Take 2 mg by mouth every other day.   Marland Kitchen UNABLE TO FIND Med Name: Tylenol liquid 1 tablespoons 2-3 times to day.   No facility-administered encounter medications on file as of 09/04/2017.     Review of Systems  Constitutional: Positive for chills and fatigue. Negative for appetite change and fever.  HENT: Positive for congestion, rhinorrhea and trouble swallowing. Negative for ear discharge, ear pain, mouth sores and sore throat.   Respiratory: Positive for cough and wheezing. Negative for shortness of breath.   Gastrointestinal: Negative for nausea and vomiting.       Ongoing loose stool, no new changes  Musculoskeletal:       No fall or injury reported.  Skin: Negative for rash.  Psychiatric/Behavioral: Positive for confusion.       Wife has noticed increased restlessness with cough and cold    Vitals:   09/04/17 1056  BP: 132/68  Pulse: 88  Resp: 16  Temp: (!) 97.4 F (36.3 C)  TempSrc: Oral  SpO2: 91%  Weight: 142 lb (64.4 kg)  Height: 5\' 7"  (1.702 m)   Body mass index is 22.24 kg/m.   Wt Readings from Last 3 Encounters:  09/04/17 142 lb  (64.4 kg)  07/17/17 140 lb (63.5 kg)  04/11/17 148 lb (67.1 kg)   Physical Exam  Constitutional: He appears well-developed and well-nourished. No distress.  HENT:  Head: Normocephalic and atraumatic.  Mouth/Throat: Oropharyngeal exudate present.  Right ear cerumen, left ear normal.  Swollen and erythematous nasal mucosa.  Eyes: Conjunctivae and EOM are normal. Right eye exhibits no discharge. Left eye exhibits no discharge.  Neck: Neck supple.  Cardiovascular: Normal rate and regular rhythm.  Pulmonary/Chest: Effort normal. No respiratory distress. He has no wheezes. He has no rales.  Poor air entry  Musculoskeletal:  Wheelchair bound, needs assistance with transfer  Lymphadenopathy:    He has no cervical adenopathy.  Neurological: He is alert.  Oriented only to self  Skin: Skin is warm and dry. He is not diaphoretic.  Psychiatric:  Pleasantly confused    Labs reviewed: Basic Metabolic Panel: Recent Labs    03/11/17 0830 03/18/17 1220 03/20/17 0814  NA 140 139 142  K 3.4* 3.8 3.4*  CL 103 105 106  CO2 25 27 24   GLUCOSE 84 93 83  BUN 14 15 16   CREATININE 1.24* 1.33* 1.28*  CALCIUM 8.9 8.8* 8.8  MG 2.0  --  2.0   Liver Function Tests: Recent Labs    03/11/17 0830 03/18/17 1220 03/20/17 0814  AST 13 22 10   ALT 8* 5* 4*  ALKPHOS 66 59 63  BILITOT 0.5 0.7 0.5  PROT 7.3 7.4 6.6  ALBUMIN 3.9 3.4* 3.5*   No results for input(s): LIPASE, AMYLASE in the last 8760 hours. No results for input(s): AMMONIA in the last 8760 hours. CBC: Recent Labs    12/06/16 1023 12/06/16 1038 02/13/17 1115 03/18/17 1220  WBC 9.7  --  5.1 6.1  NEUTROABS 8.6*  --  3,927 5.0  HGB 11.1* 10.9* 11.6* 11.8*  HCT 33.6* 32.0* 34.1* 34.9*  MCV 94.9  --  93.2 91.8  PLT 144*  --  216 180   Cardiac Enzymes: No results for input(s): CKTOTAL, CKMB, CKMBINDEX, TROPONINI in the last 8760 hours. BNP: Invalid input(s): POCBNP Lab Results  Component Value Date   HGBA1C 5.2 08/19/2013    Lab Results  Component Value Date   TSH 2.55 02/13/2017   Lab Results  Component Value Date   VITAMINB12 1,314 (H) 11/17/2013   No results found for: FOLATE No results found for: IRON, TIBC, FERRITIN  Lipid Panel: Recent Labs    09/24/16 0001  CHOL 167  HDL 46  LDLCALC 98  TRIG 114  CHOLHDL 3.6   Lab Results  Component Value Date   HGBA1C 5.2 08/19/2013    Procedures since last visit: No results found.  Assessment/Plan   1. URI with cough and congestion Has acute URI with chills, increased cough and worsening runny nose over a week. On exam has erythematous oropharynx and swollen and erythematous nasal mucosa with yellow drainage. No documented fever but pt has been receiving tylenol round the clock. He is also a high aspiration risk with his advanced dementia. Will treat this as bacterial URI. Is allergic to penicillin. Start doxycycline 100 mg q12h x 1 week with probiotics for now. Encouraged to take robitussin 10 cc tid x 5-7 days, maintain hydration and to take precaution for aspiration. If no improvement, obtain chest xray.  Labs/tests ordered:  Has future lab orders  Next appointment: in April 2019 or earlier if needed  Communication: reviewed care plan with patient and his wife    Blanchie Serve, MD Internal Medicine [place], [place] [address] Cell Phone (Monday-Friday 8 am - 5 pm): [phone] On Call: [phone] and follow prompts after 5 pm and on weekends Office Phone: [phone] Office Fax: [phone]

## 2017-09-05 DIAGNOSIS — R296 Repeated falls: Secondary | ICD-10-CM | POA: Diagnosis not present

## 2017-09-05 DIAGNOSIS — F0390 Unspecified dementia without behavioral disturbance: Secondary | ICD-10-CM | POA: Diagnosis not present

## 2017-09-05 DIAGNOSIS — I69991 Dysphagia following unspecified cerebrovascular disease: Secondary | ICD-10-CM | POA: Diagnosis not present

## 2017-09-05 DIAGNOSIS — Z952 Presence of prosthetic heart valve: Secondary | ICD-10-CM | POA: Diagnosis not present

## 2017-09-05 DIAGNOSIS — R1312 Dysphagia, oropharyngeal phase: Secondary | ICD-10-CM | POA: Diagnosis not present

## 2017-09-05 DIAGNOSIS — G468 Other vascular syndromes of brain in cerebrovascular diseases: Secondary | ICD-10-CM | POA: Diagnosis not present

## 2017-09-20 ENCOUNTER — Other Ambulatory Visit: Payer: Self-pay

## 2017-09-20 MED ORDER — ALPRAZOLAM 0.5 MG PO TABS: ORAL_TABLET | ORAL | 0 refills | Status: DC

## 2017-09-20 NOTE — Telephone Encounter (Signed)
[place] Database verified. Everything looks good.

## 2017-09-24 ENCOUNTER — Other Ambulatory Visit: Payer: Self-pay | Admitting: *Deleted

## 2017-09-24 NOTE — Telephone Encounter (Signed)
Patient's wife called about refill for alprazolam. She said it was requested 09/20/17 and hasn't been sent in yet. According to chart, it was authorized. Contacted [facility] and they said they never received it. Called in 0.5mg  1/2 tab in AM, 1/2 tab at noon and 1 tab at bedtime #[id number].

## 2017-10-22 ENCOUNTER — Other Ambulatory Visit: Payer: Self-pay | Admitting: Internal Medicine

## 2017-10-22 NOTE — Telephone Encounter (Signed)
A medication refill was received from pharmacy for alprazolam  0.5 mg. Rx was called in to pharmacy after verifying last fill date, provider, and quantity on [place] database.   

## 2017-11-07 ENCOUNTER — Encounter: Payer: Self-pay | Admitting: Family

## 2017-11-07 ENCOUNTER — Non-Acute Institutional Stay: Payer: Medicare Other | Admitting: Family

## 2017-11-07 VITALS — BP 140/60 | HR 68 | Temp 97.7°F | Resp 16 | Ht 67.0 in | Wt 145.4 lb

## 2017-11-07 DIAGNOSIS — S81812A Laceration without foreign body, left lower leg, initial encounter: Secondary | ICD-10-CM | POA: Diagnosis not present

## 2017-11-07 NOTE — Patient Instructions (Signed)
1. Please keep left leg skin tear dressing dry.May sponge bath for the next three days to prevent dressing from getting wet. 2. Notify provider for fever,increased bleeding,redness,swelling,strong odor or drainage from skin tear area.   3. You are schedule for lab work to be drawn at the [facility]isted living facility office on 11/11/2016 at 7:45 AM 4. Follow up with Dr. Bubba Camp on 11/13/2017 as directed.   Skin Tear Care A skin tear is a wound in which the top layers of skin have peeled off the deeper skin or tissues underneath them. This is a common problem as people get older because the skin becomes thinner and more fragile. In addition, some medicines, such as oral corticosteroids, can lead to skin thinning if they are taken for long periods of time. A skin tear is often repaired with tape or skin adhesive strips. Depending on the location of the wound, a bandage (dressing) may be applied over the tape or skin adhesive strips. Follow these instructions at home: Wound care  Clean the wound as told by your health care provider. You may be instructed to keep the wound dry for the first few days. If you are told to clean the wound: ? Wash the wound with mild soap and water or a salt-water (saline) solution. ? Rinse the wound with water to remove all soap. ? Do not rub the wound dry. Let the wound air dry.  Change any dressings as told by your health care provider. This includes changing the dressing if it gets wet, gets dirty, or starts to smell bad.  Do not scratch or pick at the wound.  Protect the injured area until it has healed.  Check your wound every day for signs of infection. Check for: ? More redness, swelling, or pain. ? More fluid or blood. ? Warmth. ? Pus or a bad smell. Medicines   Take over-the-counter and prescription medicines only as told by your health care provider.  If you were prescribed an antibiotic medicine, take or apply it as told by your health care provider. Do not  stop using the antibiotic even if your condition improves. General instructions  Keep the dressing dry as told by your health care provider.  Do not take baths, swim, or do anything that puts your wound underwater until your health care provider approves.  Keep all follow-up visits as told by your health care provider. This is important. Contact a health care provider if:  You have more redness, swelling, or pain around your wound.  You have more fluid or blood coming from your wound.  Your wound feels warm to the touch.  You have pus or a bad smell coming from your wound. Get help right away if:  You have a red streak that goes away from the skin tear.  You have a fever and chills and your symptoms suddenly get worse. This information is not intended to replace advice given to you by your health care provider. Make sure you discuss any questions you have with your health care provider. Document Released: 04/17/2001 Document Revised: 03/18/2016 Document Reviewed: 06/13/2015 Elsevier Interactive Patient Education  [employer].

## 2017-11-07 NOTE — Progress Notes (Signed)
Location:  [facility] of Service:  Clinic (12) Provider: Rechy Bost FNP-C  Blanchie Serve, MD  Patient Care Team: Blanchie Serve, MD as PCP - General (Internal Medicine) Ronald Lobo, MD (Gastroenterology) Rolm Bookbinder, MD as Consulting Physician (Dermatology) Nahser, [name], MD as Consulting Physician (Cardiology) Kathrynn Ducking, MD as Consulting Physician (Neurology) [name], MD as Attending Physician (Urology) Luberta Mutter, MD as Consulting Physician (Ophthalmology) Blanchie Serve, MD as Consulting Physician (Internal Medicine)  Extended Emergency Contact Information Primary Emergency Contact: [facility] Address: [address]          [place], [place] [address] [name] of [place] Phone: [phone] Mobile Phone: [phone] Relation: Spouse Secondary Emergency Contact: [place] , [place] Phone: [phone] Mobile Phone: [phone] Relation: Son  Code Status:  DNR Goals of care: Advanced Directive information Advanced Directives 04/11/2017  Does Patient Have a Medical Advance Directive? Yes  Type of Advance Directive Out of facility DNR (pink MOST or yellow form);Healthcare Power of Attorney  Does patient want to make changes to medical advance directive? -  Copy of Ward in Chart? Yes  Pre-existing out of facility DNR order (yellow form or pink MOST form) Yellow form placed in chart (order not valid for inpatient use)     Chief Complaint  Patient presents with  . Acute Visit    check laceration to left lower leg    HPI:  Pt is a 84 y.o. male seen today at [facility] clinic for an acute visit for evaluation of left leg skin tear.He is escorted today on a wheelchair by wife and Agency Care giver.Patient's wife states patient sustained a skin tear yesterday but doesn't know whether skin tear was caused by wheelchair or  not.she states dressing was applied but skin tear continues to bleed.No fever or chills reported.Patient's HPI and ROS limited due to cognitive impairment.    Past Medical History:  Diagnosis Date  . Abnormality of gait 06/07/2016  . Anemia    from AVM's. Requiring transfusion  . Aortic stenosis    s/p TAVI July 2012  . Arthritis   . Balance problem    WEAK ON RIGHT SIDE ( SCOLISOSIS AND HX OF A BACK SURGERY) - BALANCE PROBLEM - "FALLS BACKWARD" - FREQUENT FALLS.  USES CANE WHEN AMBULATING  . Cancer (HCC)    BLADDER CANCER  . Chronic kidney disease   . Complication of anesthesia    VERY DROWSY FOR HOURS AFTER SURGERY - AND DISORIENTATION  . Dementia    more memory problems from stroke  . Fall   . GERD (gastroesophageal reflux disease)    RARE - AND NO MEDS  . GI bleed   . Glaucoma   . Glaucoma   . Heart murmur    PT'S CARDIOLOGIST IS DR. Acie Fredrickson -LAST OFFICE VISIT 03/02/13 IN [facility]  . History of blood transfusion LAST 2-3 YRS AGO   HAD 4 OR 5   . History of cerebral infarction 08/19/2013  . Hypercholesteremia   . Hyperlipemia   . Hypertension    Not taking Lisinopril due to BP drops low.  . Memory loss   . Mitral regurgitation   . PONV (postoperative nausea and vomiting)    IN PAST, DID WELL WITH LAST SURGERY  . Shingles May 2013   right arm residual pain- last outbreak 1 1/2 yrs ago.  Marland Kitchen  Stroke [facility])    left internal capsule 1/15  . Transfusion history    last 2 yrs ago.   Past Surgical History:  Procedure Laterality Date  . AORTIC VALVE REPLACEMENT     02/2011([facility])  . APPENDECTOMY    . BACK SURGERY  2010 OR 2011   LOWER  . CARDIAC CATHETERIZATION  10/23/2010   ef 65%  . CARDIOVASCULAR STRESS TEST  01/01/2007   EF 64%, NO EVIDENCE OF ISCHEMIA  . CYSTOSCOPY W/ RETROGRADES Bilateral 09/17/2013   Procedure: CYSTOSCOPY WITH BILATERAL RETROGRADE;  Surgeon: [name], MD;  Location: [facility];  Service: Urology;  Laterality: Bilateral;  . CYSTOSCOPY W/ RETROGRADES  Bilateral 11/15/2015   Procedure: CYSTO, BILATERAL RETROGRADE PYELOGRAM WITH BLADDER BIOPSY;  Surgeon: [name], MD;  Location: [facility];  Service: Urology;  Laterality: Bilateral;  . CYSTOSCOPY WITH STENT PLACEMENT Left 04/13/2013   Procedure:  LEFT URETERAL STENT PLACEMENT;  Surgeon: [name], MD;  Location: [facility];  Service: Urology;  Laterality: Left;  . EYE SURGERY     bilateral cataracts with lens implants  . TRANSURETHRAL RESECTION OF BLADDER TUMOR N/A 06/04/2013   Procedure: RESTAGING TRANSURETHRAL RESECTION OF BLADDER TUMOR (TURBT), URETERAL CATHERIZATION ;  Surgeon: [name], MD;  Location: [facility];  Service: Urology;  Laterality: N/A;  CYSTO RESTAGING TURBT     . TRANSURETHRAL RESECTION OF BLADDER TUMOR N/A 09/17/2013   Procedure: TRANSURETHRAL RESECTION OF BLADDER TUMOR WITH MITOMYCIN-C;  Surgeon: [name], MD;  Location: [facility];  Service: Urology;  Laterality: N/A;  . TRANSURETHRAL RESECTION OF BLADDER TUMOR WITH GYRUS (TURBT-GYRUS) N/A 04/13/2013   Procedure: TRANSURETHRAL RESECTION OF BLADDER TUMOR WITH GYRUS (TURBT-GYRUS);  Surgeon: [name], MD;  Location: [facility];  Service: Urology;  Laterality: N/A;  . US ECHOCARDIOGRAPHY  08/31/10   EF 55-60%    Allergies  Allergen Reactions  . Advil [Ibuprofen] Other (See Comments)    Causes stomach bleeding, need transfusions as result  . Aspirin Other (See Comments)    Causes stomach bleeding, needs transfusion as result  . Penicillins Itching and Swelling    Has patient had a PCN reaction causing immediate rash, facial/tongue/throat swelling, SOB or lightheadedness with hypotension: Unsure Has patient had a PCN reaction causing severe rash involving mucus membranes or skin necrosis: Unsure  Has patient had a PCN reaction that required hospitalization Yes Has patient had a PCN reaction occurring within the last 10 years: No If all of the above answers are "NO", then may proceed with Cephalosporin use.  . Aricept [Donepezil]      Listed on face sheet  . Atorvastatin     Joint ache  . Famciclovir     Listed on face sheet  . Namenda [Memantine]     Stomach pain  . Other Other (See Comments)    No blood thinners due to GI bleed.  . Ativan [Lorazepam] Anxiety    He became very anxious and aggitated    Outpatient Encounter Medications as of 11/07/2017  Medication Sig  . ALPRAZolam (XANAX) 0.5 MG tablet TAKE 1/2 TABLET BY MOUTH EVERY MORNING THEN TAKE 1/2 TABLET AT NOON THEN TAKE 1 TABLET AT BEDTIME  . amLODipine (NORVASC) 5 MG tablet Take 5 mg by mouth daily.  . cholecalciferol (VITAMIN D) 1000 units tablet Take 1,000 Units by mouth daily.  . citalopram (CELEXA) 20 MG tablet Take 20 mg by mouth daily.   . iron polysaccharides (NIFEREX) 150 MG capsule Take 150 mg by mouth  every morning.   . latanoprost (XALATAN) 0.005 % ophthalmic solution Place 1 drop into both eyes at bedtime.  Marland Kitchen UNABLE TO FIND Med Name: Tylenol liquid 1 tablespoons 2-3 times to day.  . loperamide (IMODIUM A-D) 2 MG tablet Take 2 mg by mouth every other day.   . [DISCONTINUED] doxycycline (VIBRA-TABS) 100 MG tablet Take 1 tablet (100 mg total) by mouth 2 (two) times daily.  . [DISCONTINUED] guaiFENesin (ROBITUSSIN) 100 MG/5ML SOLN Take 10 mLs (200 mg total) by mouth 3 (three) times daily.  . [DISCONTINUED] Saccharomyces boulardii (PROBIOTIC) 250 MG CAPS Take 250 mg by mouth 2 (two) times daily.   No facility-administered encounter medications on file as of 11/07/2017.     Review of Systems  Unable to perform ROS: Dementia (additional information provided by patient's wife and sitter present during visit )  Constitutional: Negative for appetite change, chills and fever.  HENT: Negative for congestion, rhinorrhea, sinus pressure, sinus pain, sneezing and sore throat.   Respiratory: Negative for cough, chest tightness, shortness of breath and wheezing.   Cardiovascular: Negative for chest pain, palpitations and leg swelling.  Gastrointestinal:  Negative for abdominal distention, abdominal pain, constipation, nausea and vomiting.  Musculoskeletal: Positive for gait problem.  Skin: Negative for color change, pallor and rash.       Skin tear per HPI   Psychiatric/Behavioral: Positive for confusion. Negative for agitation and sleep disturbance. The patient is not nervous/anxious.     Immunization History  Administered Date(s) Administered  . Influenza Nasal 05/13/2016  . Influenza, High Dose Seasonal PF 05/15/2017  . Pneumococcal-Unspecified 03/07/2011  . Tdap 02/18/2015, 06/01/2016   Pertinent  Health Maintenance Due  Topic Date Due  . PNA vac Low Risk Adult (2 of 2 - PCV13) 03/06/2012  . INFLUENZA VACCINE  03/06/2018   Fall Risk  07/04/2017  Falls in the past year? No    Vitals:   11/07/17 1000  BP: 140/60  Pulse: 68  Resp: 16  Temp: 97.7 F (36.5 C)  TempSrc: Oral  SpO2: 91%  Weight: 145 lb 6.4 oz (66 kg)  Height: 5\' 7"  (1.702 m)   Body mass index is 22.77 kg/m. Physical Exam  Constitutional: He appears well-developed.  Elderly in no acute distress   HENT:  Head: Normocephalic.  Mouth/Throat: Oropharynx is clear and moist. No oropharyngeal exudate.  Eyes: Pupils are equal, round, and reactive to light. Conjunctivae and EOM are normal. Right eye exhibits no discharge. Left eye exhibits no discharge. No scleral icterus.  Cardiovascular: Normal rate, regular rhythm, normal heart sounds and intact distal pulses. Exam reveals no gallop and no friction rub.  No murmur heard. Pulmonary/Chest: Effort normal and breath sounds normal. No respiratory distress. He has no wheezes. He has no rales.  Abdominal: Soft. Bowel sounds are normal. He exhibits no distension. There is no tenderness. There is no rebound and no guarding.  Musculoskeletal: He exhibits no edema or tenderness.  Wheelchair bound transfers with assistance   Neurological: He is alert.  Confused at his baseline   Skin: Skin is warm and dry. No rash  noted. No erythema. No pallor.  Left leg shin area skin tear about length 3"  X 0.5" width cleansed with wound cleanser and pat dry with gauze. small area on upper skin tear bleeding noted and  was cauterized with silver nitrite.Patient tolerated procedure well.Steri-strips applied and covered with non adhesive gauze then secured with Kerlix and tape.     Psychiatric: He has a normal mood  and affect. Cognition and memory are impaired.    Labs reviewed: Recent Labs    03/11/17 0830 03/18/17 1220 03/20/17 0814  NA 140 139 142  K 3.4* 3.8 3.4*  CL 103 105 106  CO2 25 27 24   GLUCOSE 84 93 83  BUN 14 15 16   CREATININE 1.24* 1.33* 1.28*  CALCIUM 8.9 8.8* 8.8  MG 2.0  --  2.0   Recent Labs    03/11/17 0830 03/18/17 1220 03/20/17 0814  AST 13 22 10   ALT 8* 5* 4*  ALKPHOS 66 59 63  BILITOT 0.5 0.7 0.5  PROT 7.3 7.4 6.6  ALBUMIN 3.9 3.4* 3.5*   Recent Labs    12/06/16 1023 12/06/16 1038 02/13/17 1115 03/18/17 1220  WBC 9.7  --  5.1 6.1  NEUTROABS 8.6*  --  3,927 5.0  HGB 11.1* 10.9* 11.6* 11.8*  HCT 33.6* 32.0* 34.1* 34.9*  MCV 94.9  --  93.2 91.8  PLT 144*  --  216 180   Lab Results  Component Value Date   TSH 2.55 02/13/2017   Lab Results  Component Value Date   HGBA1C 5.2 08/19/2013   Lab Results  Component Value Date   CHOL 167 09/24/2016   HDL 46 09/24/2016   LDLCALC 98 09/24/2016   TRIG 114 09/24/2016   CHOLHDL 3.6 09/24/2016    Significant Diagnostic Results in last 30 days:  No results found.  Assessment/Plan   Skin tear of left lower leg without complication, initial encounter Afebrile.left shin area skin tear without any signs of infections.small area of skin tear bleeding cleansed with wound cleanser and pat dry with gauze.Bleeding area   was cauterized with silver nitrite.Patient tolerated procedure well.No sutures indicated.Steri-strips applied and covered with non adhesive gauze then secured with Kerlix and tape.Instructed patient's wife and  sitter to avoid getting dressing wet.may give sponge bath for the next three days.Monitor left leg for signs of infections.Notify provider if patient has a fever,chills,increased bleeding,redness,swelling,strong odor or drainage from skin tear.May change Kerlix dressing if soiled.Follow up for lab work and with MD as directed.  Family/ staff Communication: Reviewed plan of care with patient,patient's wife and sitter.  Labs/tests ordered: Has appointment for labs on 11/11/2017 previous ordered by MD.   Sandrea Hughs, NP

## 2017-11-11 ENCOUNTER — Other Ambulatory Visit: Payer: Medicare Other

## 2017-11-11 DIAGNOSIS — D500 Iron deficiency anemia secondary to blood loss (chronic): Secondary | ICD-10-CM | POA: Diagnosis not present

## 2017-11-11 DIAGNOSIS — N182 Chronic kidney disease, stage 2 (mild): Secondary | ICD-10-CM | POA: Diagnosis not present

## 2017-11-11 DIAGNOSIS — I1310 Hypertensive heart and chronic kidney disease without heart failure, with stage 1 through stage 4 chronic kidney disease, or unspecified chronic kidney disease: Secondary | ICD-10-CM | POA: Diagnosis not present

## 2017-11-11 LAB — CBC
HCT: 32 % — ABNORMAL LOW (ref 38.5–50.0)
Hemoglobin: 11.1 g/dL — ABNORMAL LOW (ref 13.2–17.1)
MCH: 32.1 pg (ref 27.0–33.0)
MCHC: 34.7 g/dL (ref 32.0–36.0)
MCV: 92.5 fL (ref 80.0–100.0)
MPV: 10.1 fL (ref 7.5–12.5)
PLATELETS: 178 10*3/uL (ref 140–400)
RBC: 3.46 10*6/uL — ABNORMAL LOW (ref 4.20–5.80)
RDW: 13.4 % (ref 11.0–15.0)
WBC: 4.6 10*3/uL (ref 3.8–10.8)

## 2017-11-11 LAB — COMPLETE METABOLIC PANEL WITH GFR
AG Ratio: 1.1 (calc) (ref 1.0–2.5)
ALKALINE PHOSPHATASE (APISO): 75 U/L (ref 40–115)
ALT: 6 U/L — AB (ref 9–46)
AST: 12 U/L (ref 10–35)
Albumin: 3.7 g/dL (ref 3.6–5.1)
BUN/Creatinine Ratio: 17 (calc) (ref 6–22)
BUN: 26 mg/dL — AB (ref 7–25)
CALCIUM: 9 mg/dL (ref 8.6–10.3)
CO2: 27 mmol/L (ref 20–32)
CREATININE: 1.49 mg/dL — AB (ref 0.70–1.11)
Chloride: 104 mmol/L (ref 98–110)
GFR, EST NON AFRICAN AMERICAN: 39 mL/min/{1.73_m2} — AB (ref >=60)
GFR, Est African American: 46 mL/min/{1.73_m2} — ABNORMAL LOW (ref >=60)
GLUCOSE: 78 mg/dL (ref 65–99)
Globulin: 3.5 g/dL (calc) (ref 1.9–3.7)
Potassium: 4.7 mmol/L (ref 3.5–5.3)
Sodium: 137 mmol/L (ref 135–146)
Total Bilirubin: 0.5 mg/dL (ref 0.2–1.2)
Total Protein: 7.2 g/dL (ref 6.1–8.1)

## 2017-11-13 ENCOUNTER — Encounter: Payer: Self-pay | Admitting: Internal Medicine

## 2017-11-13 ENCOUNTER — Non-Acute Institutional Stay: Payer: Medicare Other | Admitting: Internal Medicine

## 2017-11-13 VITALS — BP 124/60 | HR 62 | Temp 97.6°F | Resp 16 | Ht 67.0 in

## 2017-11-13 DIAGNOSIS — D638 Anemia in other chronic diseases classified elsewhere: Secondary | ICD-10-CM

## 2017-11-13 DIAGNOSIS — N183 Chronic kidney disease, stage 3 unspecified: Secondary | ICD-10-CM

## 2017-11-13 DIAGNOSIS — Z23 Encounter for immunization: Secondary | ICD-10-CM | POA: Diagnosis not present

## 2017-11-13 DIAGNOSIS — F02818 Dementia in other diseases classified elsewhere, unspecified severity, with other behavioral disturbance: Secondary | ICD-10-CM

## 2017-11-13 DIAGNOSIS — Z8673 Personal history of transient ischemic attack (TIA), and cerebral infarction without residual deficits: Secondary | ICD-10-CM

## 2017-11-13 DIAGNOSIS — Z8551 Personal history of malignant neoplasm of bladder: Secondary | ICD-10-CM

## 2017-11-13 DIAGNOSIS — Z952 Presence of prosthetic heart valve: Secondary | ICD-10-CM

## 2017-11-13 DIAGNOSIS — I1310 Hypertensive heart and chronic kidney disease without heart failure, with stage 1 through stage 4 chronic kidney disease, or unspecified chronic kidney disease: Secondary | ICD-10-CM

## 2017-11-13 DIAGNOSIS — F0281 Dementia in other diseases classified elsewhere with behavioral disturbance: Secondary | ICD-10-CM

## 2017-11-13 DIAGNOSIS — G301 Alzheimer's disease with late onset: Secondary | ICD-10-CM

## 2017-11-13 MED ORDER — ZOSTER VAC RECOMB ADJUVANTED 50 MCG/0.5ML IM SUSR: 0.5000 mL | Freq: Once | INTRAMUSCULAR | 0 refills | Status: AC

## 2017-11-13 NOTE — Patient Instructions (Addendum)
  Clean wound area with normal saline- do not scrub, then let it dry and apply telfa non adherent dressing on it. Apply gauze dressing around it to hold the dressing in place and put tape on it.   I would encourage you to drink water to keep yourself hydrated.  Avoid taking any OTC pain medication.  I have sent a shingles vaccine to your pharmacy.  I would recommend you to see your eye doctor for your glaucoma

## 2017-11-13 NOTE — Progress Notes (Signed)
[facility]  Provider: Blanchie Serve MD   Location:  [place] [facility] Service:  Clinic (12)  PCP: Blanchie Serve, MD Patient Care Team: Blanchie Serve, MD as PCP - General (Internal Medicine) Ronald Lobo, MD (Gastroenterology) Rolm Bookbinder, MD as Consulting Physician (Dermatology) Nahser, [name], MD as Consulting Physician (Cardiology) Kathrynn Ducking, MD as Consulting Physician (Neurology) [name], MD as Attending Physician (Urology) Luberta Mutter, MD as Consulting Physician (Ophthalmology) Blanchie Serve, MD as Consulting Physician (Internal Medicine)  Extended Emergency Contact Information Primary Emergency Contact: [facility] Address: [address]          [place], [place] [address] [name] of [place] Phone: [phone] Mobile Phone: [phone] Relation: Spouse Secondary Emergency Contact: Saur,Jeffrey          DES Merla Riches of [place] Phone: [phone] Mobile Phone: [phone] Relation: Son  Code Status: DNR  Goals of Care: Advanced Directive information Advanced Directives 04/11/2017  Does Patient Have a Medical Advance Directive? Yes  Type of Advance Directive Out of facility DNR (pink MOST or yellow form);Healthcare Power of Attorney  Does patient want to make changes to medical advance directive? -  Copy of [place] in Chart? Yes  Pre-existing out of facility DNR order (yellow form or pink MOST form) Yellow form placed in chart (order not valid for inpatient use)    Chief Complaint  Patient presents with  . Medical Management of Chronic Issues    4 montth follow up. Patient's husband stated that he still having issues with diarrhea.   . Medication Refill    No refills needed at this time  . Results    Discuss labs    HPI: Patient is a 84 y.o. male seen today for routine visit. He sustained a skin tear to left leg a week back. Wife is  not sure about how he sustained it. No fall reported.   Hypertension- on amlodipine 5 mg daily, has ckd, renal function reviewed.   Iron def anemia- on niferex 150 mg daily, reviewed cbc and MCV. No constipation  Depression- takes citalopram 20 mg daily and alprazolam 0.25 mg bid, he does get anxious and agitated at times but overall mood stable per wife.   Glaucoma- taking latanoprost drop to both eyes, has not seen eye doctor for few years  Aortic stenosis- s/p TAVR. No complaints of chest pain.   HPI and ROS limited with his dementia, obtained from his wife.   Past Medical History:  Diagnosis Date  . Abnormality of gait 06/07/2016  . Anemia    from AVM's. Requiring transfusion  . Aortic stenosis    s/p TAVI July 2012  . Arthritis   . Balance problem    WEAK ON RIGHT SIDE ( SCOLISOSIS AND HX OF A BACK SURGERY) - BALANCE PROBLEM - "FALLS BACKWARD" - FREQUENT FALLS.  USES CANE WHEN AMBULATING  . Cancer (HCC)    BLADDER CANCER  . Chronic kidney disease   . Complication of anesthesia    VERY DROWSY FOR HOURS AFTER SURGERY - AND DISORIENTATION  . Dementia    more memory problems from stroke  . Fall   . GERD (gastroesophageal reflux disease)    RARE - AND NO MEDS  . GI bleed   . Glaucoma   . Glaucoma   . Heart murmur    PT'S CARDIOLOGIST IS DR. Acie Fredrickson -LAST OFFICE VISIT 03/02/13 IN [facility]  .  History of blood transfusion LAST 2-3 YRS AGO   HAD 4 OR 5   . History of cerebral infarction 08/19/2013  . Hypercholesteremia   . Hyperlipemia   . Hypertension    Not taking Lisinopril due to BP drops low.  . Memory loss   . Mitral regurgitation   . PONV (postoperative nausea and vomiting)    IN PAST, DID WELL WITH LAST SURGERY  . Shingles May 2013   right arm residual pain- last outbreak 1 1/2 yrs ago.  . Stroke ([place])    left internal capsule 1/15  . Transfusion history    last 2 yrs ago.   Past Surgical History:  Procedure Laterality Date  . AORTIC VALVE REPLACEMENT      02/2011([facility])  . APPENDECTOMY    . BACK SURGERY  2010 OR 2011   LOWER  . CARDIAC CATHETERIZATION  10/23/2010   ef 65%  . CARDIOVASCULAR STRESS TEST  01/01/2007   EF 64%, NO EVIDENCE OF ISCHEMIA  . CYSTOSCOPY W/ RETROGRADES Bilateral 09/17/2013   Procedure: CYSTOSCOPY WITH BILATERAL RETROGRADE;  Surgeon: [name], MD;  Location: [facility];  Service: Urology;  Laterality: Bilateral;  . CYSTOSCOPY W/ RETROGRADES Bilateral 11/15/2015   Procedure: CYSTO, BILATERAL RETROGRADE PYELOGRAM WITH BLADDER BIOPSY;  Surgeon: [name], MD;  Location: [facility];  Service: Urology;  Laterality: Bilateral;  . CYSTOSCOPY WITH STENT PLACEMENT Left 04/13/2013   Procedure:  LEFT URETERAL STENT PLACEMENT;  Surgeon: [name], MD;  Location: [facility];  Service: Urology;  Laterality: Left;  . EYE SURGERY     bilateral cataracts with lens implants  . TRANSURETHRAL RESECTION OF BLADDER TUMOR N/A 06/04/2013   Procedure: RESTAGING TRANSURETHRAL RESECTION OF BLADDER TUMOR (TURBT), URETERAL CATHERIZATION ;  Surgeon: [name], MD;  Location: [facility];  Service: Urology;  Laterality: N/A;  CYSTO RESTAGING TURBT     . TRANSURETHRAL RESECTION OF BLADDER TUMOR N/A 09/17/2013   Procedure: TRANSURETHRAL RESECTION OF BLADDER TUMOR WITH MITOMYCIN-C;  Surgeon: [name], MD;  Location: [facility];  Service: Urology;  Laterality: N/A;  . TRANSURETHRAL RESECTION OF BLADDER TUMOR WITH GYRUS (TURBT-GYRUS) N/A 04/13/2013   Procedure: TRANSURETHRAL RESECTION OF BLADDER TUMOR WITH GYRUS (TURBT-GYRUS);  Surgeon: [name], MD;  Location: [facility];  Service: Urology;  Laterality: N/A;  . US ECHOCARDIOGRAPHY  08/31/10   EF 55-60%    reports that he quit smoking about 36 years ago. His smoking use included pipe. He has never used smokeless tobacco. He reports that he drinks about 0.6 oz of alcohol per week. He reports that he does not use drugs. Social History   Socioeconomic History  . Marital status: Married    Spouse name: Not on file  .  Number of children: 2  . Years of education: College  . Highest education level: Not on file  Occupational History  . Occupation: retired Copy  . Financial resource strain: Not on file  . Food insecurity:    Worry: Not on file    Inability: Not on file  . Transportation needs:    Medical: Not on file    Non-medical: Not on file  Tobacco Use  . Smoking status: Former Smoker    Types: Pipe    Last attempt to quit: 07/04/1981    Years since quitting: 36.3  . Smokeless tobacco: Never Used  Substance and Sexual Activity  . Alcohol use: Yes    Alcohol/week: 0.6 oz    Types: 1  Glasses of wine per week  . Drug use: No  . Sexual activity: Not Currently  Lifestyle  . Physical activity:    Days per week: Not on file    Minutes per session: Not on file  . Stress: Not on file  Relationships  . Social connections:    Talks on phone: Not on file    Gets together: Not on file    Attends religious service: Not on file    Active member of club or organization: Not on file    Attends meetings of clubs or organizations: Not on file    Relationship status: Not on file  . Intimate partner violence:    Fear of current or ex partner: Not on file    Emotionally abused: Not on file    Physically abused: Not on file    Forced sexual activity: Not on file  Other Topics Concern  . Not on file  Social History Narrative   L[facility] at [facility] 03/13/2016   Patient lives at home with his wife has 2 children.   Married 1960.   Patient is left handed.   Patient has college education.   Patient drink 4 cups daily.   Smoked pipe 20-35 years    Alcohol 1 glass of wine per day    Exercise no       Functional Status Survey:    Family History  Problem Relation Age of Onset  . COPD Father   . Stroke Father   . Parkinson's disease Brother 23  . Heart failure Mother   . Bowel Disease Sister 64    Health Maintenance  Topic Date Due  . PNA vac Low Risk Adult (2 of 2 -  PCV13) 03/06/2012  . INFLUENZA VACCINE  03/06/2018  . TETANUS/TDAP  06/01/2026    Allergies  Allergen Reactions  . Advil [Ibuprofen] Other (See Comments)    Causes stomach bleeding, need transfusions as result  . Aspirin Other (See Comments)    Causes stomach bleeding, needs transfusion as result  . Penicillins Itching and Swelling    Has patient had a PCN reaction causing immediate rash, facial/tongue/throat swelling, SOB or lightheadedness with hypotension: Unsure Has patient had a PCN reaction causing severe rash involving mucus membranes or skin necrosis: Unsure  Has patient had a PCN reaction that required hospitalization Yes Has patient had a PCN reaction occurring within the last 10 years: No If all of the above answers are "NO", then may proceed with Cephalosporin use.  . Aricept [Donepezil]     Listed on face sheet  . Atorvastatin     Joint ache  . Famciclovir     Listed on face sheet  . Namenda [Memantine]     Stomach pain  . Other Other (See Comments)    No blood thinners due to GI bleed.  . Ativan [Lorazepam] Anxiety    He became very anxious and aggitated    Outpatient Encounter Medications as of 11/13/2017  Medication Sig  . ALPRAZolam (XANAX) 0.5 MG tablet TAKE 1/2 TABLET BY MOUTH EVERY MORNING THEN TAKE 1/2 TABLET AT NOON THEN TAKE 1 TABLET AT BEDTIME  . amLODipine (NORVASC) 5 MG tablet Take 5 mg by mouth daily.  . cholecalciferol (VITAMIN D) 1000 units tablet Take 1,000 Units by mouth daily.  . citalopram (CELEXA) 20 MG tablet Take 20 mg by mouth daily.   . iron polysaccharides (NIFEREX) 150 MG capsule Take 150 mg by mouth every morning.   Marland Kitchen  latanoprost (XALATAN) 0.005 % ophthalmic solution Place 1 drop into both eyes at bedtime.  Marland Kitchen loperamide (IMODIUM A-D) 2 MG tablet Take 2 mg by mouth every other day.   Marland Kitchen UNABLE TO FIND Med Name: Tylenol liquid 1 tablespoons 2-3 times to day.   No facility-administered encounter medications on file as of 11/13/2017.      Review of Systems  Reason unable to perform ROS: obtained from his wife, patient has dementia.  Constitutional: Negative for appetite change, chills and fever.  HENT: Positive for trouble swallowing. Negative for congestion, ear discharge, ear pain, mouth sores, rhinorrhea and sore throat.   Eyes: Positive for visual disturbance.       Has glaucoma and uses eye drops, has not seen eye doctor in few years  Respiratory: Positive for cough. Negative for shortness of breath.        Cough with meals and medication, takes big bites at times and can choke, has worked with SLP team, family attentive during his meals  Cardiovascular: Negative for chest pain and palpitations.  Gastrointestinal: Negative for abdominal pain, blood in stool, nausea and vomiting.       Has altered bowel movement with loose stool and constipation.   Genitourinary: Negative for dysuria and hematuria.       Uses diapers, has history of bladder cancer and f/b urology  Musculoskeletal: Positive for back pain and gait problem. Negative for arthralgias.       No fall reported, needs wheelchair for ambulation, can use walker with maximum 1 person assistance for transfer  Skin: Positive for wound. Negative for rash.  Neurological: Negative for dizziness, syncope and headaches.  Psychiatric/Behavioral: Positive for agitation and confusion.    Vitals:   11/13/17 1125  BP: 124/60  Pulse: 62  Resp: 16  Temp: 97.6 F (36.4 C)  TempSrc: Oral  SpO2: 99%  Height: _0  (1.702 m)   Body mass index is 22.77 kg/m.   Wt Readings from Last 3 Encounters:  11/07/17 145 lb 6.4 oz (66 kg)  09/04/17 142 lb (64.4 kg)  07/17/17 140 lb (63.5 kg)   Physical Exam  Constitutional: He appears well-developed and well-nourished. No distress.  HENT:  Head: Normocephalic.  Nose: Nose normal.  Mouth/Throat: Oropharynx is clear and moist.  Eyes: Pupils are equal, round, and reactive to light. Conjunctivae and EOM are normal. Right eye  exhibits no discharge. Left eye exhibits no discharge.  Neck: Normal range of motion. Neck supple.  Cardiovascular: Normal rate, regular rhythm and intact distal pulses.  Pulmonary/Chest: Effort normal and breath sounds normal. He has no wheezes. He has no rales.  Abdominal: Soft. Bowel sounds are normal. There is no tenderness. There is no guarding.  Musculoskeletal:  Trace leg edema, able to move all 4 extremities, strength 4/5 in all, needs 1 person maximum assistance with transfer with help of walker, wheelchair bound  Lymphadenopathy:    He has no cervical adenopathy.  Neurological: He is alert.  Oriented to self only  Skin: Skin is warm and dry. No rash noted. He is not diaphoretic.  Skin tear to LLE with steri strip in place.   Psychiatric: He has a normal mood and affect.    Labs reviewed: Basic Metabolic Panel: Recent Labs    03/11/17 0830 03/18/17 1220 03/20/17 0814 11/11/17 0810  NA 140 139 142 137  K 3.4* 3.8 3.4* 4.7  CL 103 105 106 104  CO2 _1 GLUCOSE 84 93 83 78  BUN _0 26*  CREATININE 1.24* 1.33* 1.28* 1.49*  CALCIUM 8.9 8.8* 8.8 9.0  MG 2.0  --  2.0  --    Liver Function Tests: Recent Labs    03/11/17 0830 03/18/17 1220 03/20/17 0814 11/11/17 0810  AST _1 ALT 8* 5* 4* 6*  ALKPHOS 66 59 63  --   BILITOT 0.5 0.7 0.5 0.5  PROT 7.3 7.4 6.6 7.2  ALBUMIN 3.9 3.4* 3.5*  --    No results for input(s): LIPASE, AMYLASE in the last 8760 hours. No results for input(s): AMMONIA in the last 8760 hours. CBC: Recent Labs    12/06/16 1023  02/13/17 1115 03/18/17 1220 11/11/17 0810  WBC 9.7  --  5.1 6.1 4.6  NEUTROABS 8.6*  --  3,927 5.0  --   HGB 11.1*   < > 11.6* 11.8* 11.1*  HCT 33.6*   < > 34.1* 34.9* 32.0*  MCV 94.9  --  93.2 91.8 92.5  PLT 144*  --  216 180 178   < > = values in this interval not displayed.   Cardiac Enzymes: No results for input(s): CKTOTAL, CKMB, CKMBINDEX, TROPONINI in the last 8760  hours. BNP: Invalid input(s): POCBNP Lab Results  Component Value Date   HGBA1C 5.2 08/19/2013   Lab Results  Component Value Date   TSH 2.55 02/13/2017   Lab Results  Component Value Date   VITAMINB12 1,314 (H) 11/17/2013   No results found for: FOLATE No results found for: IRON, TIBC, FERRITIN  Lipid Panel: No results for input(s): CHOL, HDL, LDLCALC, TRIG, CHOLHDL, LDLDIRECT in the last 8760 hours. Lab Results  Component Value Date   HGBA1C 5.2 08/19/2013    Procedures since last visit: No results found.  Assessment/Plan  1. History of cerebral infarction Not on antiplatelet agent with history of GI bleed. Controlled BP readings.   2. Late onset Alzheimer's disease with behavioral disturbance Supportive care. Fall precautions. Pressure ulcer prophylaxis.   3. Hx of bladder cancer F/u with urology, incontinence care  4. CKD (chronic kidney disease) stage 3, GFR 30-59 ml/min (HCC) Slightly worsened renal function from last visit. Encouraged hydration. - CMP with eGFR([facility]); Future - Lipid Panel; Future - CBC (no diff); Future  5. Hypertensive heart and renal disease with renal failure, stage 1 through stage 4 or unspecified chronic kidney disease, without heart failure Continue amlodipine 5 mg daily, reviewed BMP - Lipid Panel; Future  6. S/P TAVR (transcatheter aortic valve replacement) stable  7. Anemia of chronic disease Reviewed cbc, continue iron supplement - CBC (no diff); Future  8. Need for shingles vaccine - Zoster Vaccine Adjuvanted [facility]) injection; Inject 0.5 mLs into the muscle once for 1 dose.  Dispense: 0.5 mL; Refill:   9. Skin tear to LLE steri strip in place, continue to clean area with saline, pat dry, to apply non adheret dressing on top of steri strip area and a gauze for wrap and secure with tape.   Labs/tests ordered:   Lab Orders     CMP with eGFR([facility])     Lipid Panel     CBC (no diff)   Next appointment: 4  months  Communication: reviewed care plan with patient and his wife    Blanchie Serve, MD Internal Medicine [place], [place] [address] Cell Phone (Monday-Friday 8 am - 5 pm): [phone] On Call: [phone] and follow prompts after 5 pm and  on weekends Office Phone: [phone] Office Fax: [phone]

## 2017-11-18 ENCOUNTER — Other Ambulatory Visit: Payer: Self-pay | Admitting: Internal Medicine

## 2017-11-20 ENCOUNTER — Telehealth: Payer: Self-pay

## 2017-11-20 ENCOUNTER — Other Ambulatory Visit: Payer: Self-pay | Admitting: *Deleted

## 2017-11-20 MED ORDER — ALPRAZOLAM 0.5 MG PO TABS: ORAL_TABLET | ORAL | 0 refills | Status: DC

## 2017-11-20 NOTE — Telephone Encounter (Signed)
Received a call from Mrs. Mey stating that her husband "has been a absolute terror" due to the fact that he ran out of pills and that she had to give him some of her xanax. She stated that she needed his xanax refilled and the dosage to be increased. She mentioned that the pharmacy denied the refill when she called up there 2 days ago. I explained to her that pharmacy wouldn't fill the prescription because it is a controlled substance and they refill those when their due. Mrs. mcginness then stated that if his prescription isn't filled or increased today that she would come down to the clinic to talk to Dr. Bubba Camp. I advised the patient that I would relay the message to Dr. Bubba Camp and she would handle it accordingly.

## 2017-11-20 NOTE — Telephone Encounter (Signed)
Mrs. bagshaw had some concerns about her husbands anxiety and wanted his Xanax increased. Per Dr. Bubba Camp she would like to see the patient in the clinic to the evaluate and decide on a treatment plan. I left a voicemail for the patient's wife to call me back in the clinic.

## 2017-11-20 NOTE — Telephone Encounter (Signed)
Spoke with Mrs. keziah and she has agreed to bring her husband to the clinic on Wednesday 11/27/17 at 11:30 am.

## 2017-11-20 NOTE — Telephone Encounter (Signed)
Wife called questioning why this was denied 2 days ago. (I don't see where it was denied or requested). However, she said when she requested it he was down to 2 pills and she is tired of all the hoops she has to jump through every time he needs something. I advised her that the only reason I could think of as to why med was denied was because possibly it was considered too early to refill. She said it wasn't too early. I advised based on the last date it was refilled (10/22/17), he had a 1 month supply and shouldn't have run out 2 days ago. She said that he takes 3 pills daily. His SIG says 1/2 tablet in the AM, 1/2 at noon and 1 tablet at bedtime for a total of 2 pills daily. She said he has always taken 3 daily and doesn't know why it was changed. She demands that it be changed and refilled today. I advised I couldn't change the SIG, so it would have to be approved. She was very upset saying that we don't understand and that we have too many hoops to jump through. I advised we do understand, but that if she increases a SIG on her own, then they will run out before they should and nothing can be done about that. If he is requiring more than prescribed, then that needs to be addressed at his appointments. She said it better be refilled and changed today.

## 2017-11-21 ENCOUNTER — Telehealth: Payer: Self-pay | Admitting: *Deleted

## 2017-11-21 MED ORDER — ALPRAZOLAM 0.5 MG PO TABS: ORAL_TABLET | ORAL | 0 refills | Status: DC

## 2017-11-21 NOTE — Telephone Encounter (Signed)
This wasn't sent in yesterday. It was labeled as "phone in" and wasn't forwarded to me or [place] to do so. It wouldn't let me send it in this AM because it's controlled and was saying "the user is different from the prescriber". I called it in this AM.

## 2017-11-27 ENCOUNTER — Encounter: Payer: Self-pay | Admitting: Internal Medicine

## 2017-11-27 ENCOUNTER — Non-Acute Institutional Stay: Payer: Medicare Other | Admitting: Internal Medicine

## 2017-11-27 ENCOUNTER — Telehealth: Payer: Self-pay

## 2017-11-27 VITALS — BP 130/68 | HR 60 | Temp 97.6°F | Resp 16 | Ht 67.0 in | Wt 145.5 lb

## 2017-11-27 DIAGNOSIS — F0391 Unspecified dementia with behavioral disturbance: Secondary | ICD-10-CM

## 2017-11-27 DIAGNOSIS — F411 Generalized anxiety disorder: Secondary | ICD-10-CM

## 2017-11-27 DIAGNOSIS — S81812S Laceration without foreign body, left lower leg, sequela: Secondary | ICD-10-CM

## 2017-11-27 MED ORDER — ALPRAZOLAM 0.5 MG PO TABS: ORAL_TABLET | ORAL | 0 refills | Status: DC

## 2017-11-27 NOTE — Telephone Encounter (Signed)
Opened in error

## 2017-11-27 NOTE — Progress Notes (Signed)
Bryan Moore  Provider: Blanchie Serve MD   Location:  [place] of Service:  Mo[facility] (12)  PCP: Blanchie Serve, MD Patient Care Team: Blanchie Serve, MD as PCP - General (Internal Medicine) Ronald Lobo, MD (Gastroenterology) Rolm Bookbinder, MD as Consulting Physician (Dermatology) Nahser, [name], MD as Consulting Physician (Cardiology) Kathrynn Ducking, MD as Consulting Physician (Neurology) [name], MD as Attending Physician (Urology) Luberta Mutter, MD as Consulting Physician (Ophthalmology) Blanchie Serve, MD as Consulting Physician (Internal Medicine)  Extended Emergency Contact Information Primary Emergency Contact: [facility] Address: [address]          [place], [place] [address] Bryan Moore of [place] Phone: [phone] Mobile Phone: [phone] Relation: Spouse Secondary Emergency Contact: [place] , [place] Phone: [phone] Mobile Phone: [phone] Relation: Son  Goals of Care: Advanced Directive information Advanced Directives 04/11/2017  Does Patient Have a Medical Advance Directive? Yes  Type of Advance Directive Out of facility DNR (pink MOST or yellow form);Healthcare Power of Attorney  Does patient want to make changes to medical advance directive? -  Copy of [place] in Chart? Yes  Pre-existing out of facility DNR order (yellow form or pink MOST form) Yellow form placed in chart (order not valid for inpatient use)      Chief Complaint  Patient presents with  . Acute Visit    anxiety  . Medication Refill    HPI: Patient is a 84 y.o. male seen today for acute visit. He is here with his wife. He does not participate in HPI and ROS. Per his wife, his anxiety has worsened mainly in the afternoon. He is currently on citalopram and prn alprazolam for his behavior. He takes 0.25 mg of alprazolam in morning  and 2 pm and 1 tablet of 0.5 mg at night time. He has tried 0.5 mg in morning and this appears to be too strong for him and makes him sleepy. She has been giving additional half a tablet of alprazolam at noon to help his anxiety and mentions this to have good result. He has advanced dementia, has been on alpraolam for sometime now and tolerates it well. No fall reported.   Past Medical History:  Diagnosis Date  . Abnormality of gait 06/07/2016  . Anemia    from AVM's. Requiring transfusion  . Aortic stenosis    s/p TAVI July 2012  . Arthritis   . Balance problem    WEAK ON RIGHT SIDE ( SCOLISOSIS AND HX OF A BACK SURGERY) - BALANCE PROBLEM - "FALLS BACKWARD" - FREQUENT FALLS.  USES CANE WHEN AMBULATING  . Cancer (HCC)    BLADDER CANCER  . Chronic kidney disease   . Complication of anesthesia    VERY DROWSY FOR HOURS AFTER SURGERY - AND DISORIENTATION  . Dementia    more memory problems from stroke  . Fall   . GERD (gastroesophageal reflux disease)    RARE - AND NO MEDS  . GI bleed   . Glaucoma   . Glaucoma   . Heart murmur    PT'S CARDIOLOGIST IS DR. Acie Fredrickson -LAST OFFICE VISIT 03/02/13 IN [facility]  . History of blood transfusion LAST 2-3 YRS AGO   HAD 4 OR 5   . History of cerebral infarction 08/19/2013  . Hypercholesteremia   . Hyperlipemia   . Hypertension  Not taking Lisinopril due to BP drops low.  . Memory loss   . Mitral regurgitation   . PONV (postoperative nausea and vomiting)    IN PAST, DID WELL WITH LAST SURGERY  . Shingles May 2013   right arm residual pain- last outbreak 1 1/2 yrs ago.  . Stroke ([place])    left internal capsule 1/15  . Transfusion history    last 2 yrs ago.   Past Surgical History:  Procedure Laterality Date  . AORTIC VALVE REPLACEMENT     02/2011([facility])  . APPENDECTOMY    . BACK SURGERY  2010 OR 2011   LOWER  . CARDIAC CATHETERIZATION  10/23/2010   ef 65%  . CARDIOVASCULAR STRESS TEST  01/01/2007   EF 64%, NO EVIDENCE OF ISCHEMIA  .  CYSTOSCOPY W/ RETROGRADES Bilateral 09/17/2013   Procedure: CYSTOSCOPY WITH BILATERAL RETROGRADE;  Surgeon: [name], MD;  Location: [facility];  Service: Urology;  Laterality: Bilateral;  . CYSTOSCOPY W/ RETROGRADES Bilateral 11/15/2015   Procedure: CYSTO, BILATERAL RETROGRADE PYELOGRAM WITH BLADDER BIOPSY;  Surgeon: [name], MD;  Location: [facility];  Service: Urology;  Laterality: Bilateral;  . CYSTOSCOPY WITH STENT PLACEMENT Left 04/13/2013   Procedure:  LEFT URETERAL STENT PLACEMENT;  Surgeon: [name], MD;  Location: [facility];  Service: Urology;  Laterality: Left;  . EYE SURGERY     bilateral cataracts with lens implants  . TRANSURETHRAL RESECTION OF BLADDER TUMOR N/A 06/04/2013   Procedure: RESTAGING TRANSURETHRAL RESECTION OF BLADDER TUMOR (TURBT), URETERAL CATHERIZATION ;  Surgeon: [name], MD;  Location: [facility];  Service: Urology;  Laterality: N/A;  CYSTO RESTAGING TURBT     . TRANSURETHRAL RESECTION OF BLADDER TUMOR N/A 09/17/2013   Procedure: TRANSURETHRAL RESECTION OF BLADDER TUMOR WITH MITOMYCIN-C;  Surgeon: [name], MD;  Location: [facility];  Service: Urology;  Laterality: N/A;  . TRANSURETHRAL RESECTION OF BLADDER TUMOR WITH GYRUS (TURBT-GYRUS) N/A 04/13/2013   Procedure: TRANSURETHRAL RESECTION OF BLADDER TUMOR WITH GYRUS (TURBT-GYRUS);  Surgeon: [name], MD;  Location: [facility];  Service: Urology;  Laterality: N/A;  . US ECHOCARDIOGRAPHY  08/31/10   EF 55-60%    reports that he quit smoking about 36 years ago. His smoking use included pipe. He has never used smokeless tobacco. He reports that he drinks about 0.6 oz of alcohol per week. He reports that he does not use drugs. Social History   Socioeconomic History  . Marital status: Married    Spouse name: Not on file  . Number of children: 2  . Years of education: College  . Highest education level: Not on file  Occupational History  . Occupation: retired Copy  . Financial resource strain: Not on file    . Food insecurity:    Worry: Not on file    Inability: Not on file  . Transportation needs:    Medical: Not on file    Non-medical: Not on file  Tobacco Use  . Smoking status: Former Smoker    Types: Pipe    Last attempt to quit: 07/04/1981    Years since quitting: 36.4  . Smokeless tobacco: Never Used  Substance and Sexual Activity  . Alcohol use: Yes    Alcohol/week: 0.6 oz    Types: 1 Glasses of wine per week  . Drug use: No  . Sexual activity: Not Currently  Lifestyle  . Physical activity:    Days per week: Not on file    Minutes  per session: Not on file  . Stress: Not on file  Relationships  . Social connections:    Talks on phone: Not on file    Gets together: Not on file    Attends religious service: Not on file    Active member of club or organization: Not on file    Attends meetings of clubs or organizations: Not on file    Relationship status: Not on file  . Intimate partner violence:    Fear of current or ex partner: Not on file    Emotionally abused: Not on file    Physically abused: Not on file    Forced sexual activity: Not on file  Other Topics Concern  . Not on file  Social History Narrative   [facility]13[facility]   Patient lives at home with his wife has 2 children.   Married 1960.   Patient is left handed.   Patient has college education.   Patient drink 4 cups daily.   Smoked pipe 20-35 years    Alcohol 1 glass of wine per day    Exercise no        Family History  Problem Relation Age of Onset  . COPD Father   . Stroke Father   . Parkinson's disease Brother 35  . Heart failure Mother   . Bowel Disease Sister 69    Health Maintenance  Topic Date Due  . PNA vac Low Risk Adult (2 of 2 - PCV13) 03/06/2012  . INFLUENZA VACCINE  03/06/2018  . TETANUS/TDAP  06/01/2026    Allergies  Allergen Reactions  . Advil [Ibuprofen] Other (See Comments)    Causes stomach bleeding, need transfusions as result  . Aspirin Other  (See Comments)    Causes stomach bleeding, needs transfusion as result  . Penicillins Itching and Swelling    Has patient had a PCN reaction causing immediate rash, facial/tongue/throat swelling, SOB or lightheadedness with hypotension: Unsure Has patient had a PCN reaction causing severe rash involving mucus membranes or skin necrosis: Unsure  Has patient had a PCN reaction that required hospitalization Yes Has patient had a PCN reaction occurring within the last 10 years: No If all of the above answers are "NO", then may proceed with Cephalosporin use.  . Aricept [Donepezil]     Listed on face sheet  . Atorvastatin     Joint ache  . Famciclovir     Listed on face sheet  . Namenda [Memantine]     Stomach pain  . Other Other (See Comments)    No blood thinners due to GI bleed.  . Ativan [Lorazepam] Anxiety    He became very anxious and aggitated    Outpatient Encounter Medications as of 11/27/2017  Medication Sig  . ALPRAZolam (XANAX) 0.5 MG tablet TAKE 1/2 TABLET BY MOUTH EVERY AM,1/2 TABLET AT NOON THEN TAKE 1 TABLET AT BEDTIME  . amLODipine (NORVASC) 5 MG tablet Take 5 mg by mouth daily.  . cholecalciferol (VITAMIN D) 1000 units tablet Take 1,000 Units by mouth daily.  . citalopram (CELEXA) 20 MG tablet Take 20 mg by mouth daily.   . iron polysaccharides (NIFEREX) 150 MG capsule Take 150 mg by mouth every morning.   . latanoprost (XALATAN) 0.005 % ophthalmic solution Place 1 drop into both eyes at bedtime.  Marland Kitchen loperamide (IMODIUM A-D) 2 MG tablet Take 2 mg by mouth every other day.   Marland Kitchen UNABLE TO FIND Med Name: Tylenol liquid 1 tablespoons 2-3  times to day.   No facility-administered encounter medications on file as of 11/27/2017.     Review of Systems  Unable to perform ROS: Dementia    Vitals:   11/27/17 1132  BP: 130/68  Pulse: 60  Resp: 16  Temp: 97.6 F (36.4 C)  TempSrc: Oral  SpO2: 91%  Weight: 145 lb 8 oz (66 kg)  Height: 5\' 7"  (1.702 m)   Body mass index is  22.79 kg/m. Physical Exam  Constitutional: He appears well-developed. No distress.  Frail elderly male  HENT:  Head: Normocephalic and atraumatic.  Mouth/Throat: Oropharynx is clear and moist.  Neck: Neck supple.  Cardiovascular: Normal rate and regular rhythm.  Pulmonary/Chest: Effort normal and breath sounds normal.  Abdominal: Soft. Bowel sounds are normal. There is no tenderness.  Lymphadenopathy:    He has no cervical adenopathy.  Neurological:  Pleasantly confused, calm this visit, unable to participate in HPI and ROS  Skin: Skin is warm and dry. He is not diaphoretic. There is erythema.  Open skin area with laceration to left lower extremity, erythema to surrounding skin, normal temperature, no drainage reported.     Labs reviewed: Basic Metabolic Panel: Recent Labs    03/11/17 0830 03/18/17 1220 03/20/17 0814 11/11/17 0810  NA 140 139 142 137  K 3.4* 3.8 3.4* 4.7  CL 103 105 106 104  CO2 25 27 24 27   GLUCOSE 84 93 83 78  BUN 14 15 16  26*  CREATININE 1.24* 1.33* 1.28* 1.49*  CALCIUM 8.9 8.8* 8.8 9.0  MG 2.0  --  2.0  --    Liver Function Tests: Recent Labs    03/11/17 0830 03/18/17 1220 03/20/17 0814 11/11/17 0810  AST 13 22 10 12   ALT 8* 5* 4* 6*  ALKPHOS 66 59 63  --   BILITOT 0.5 0.7 0.5 0.5  PROT 7.3 7.4 6.6 7.2  ALBUMIN 3.9 3.4* 3.5*  --    No results for input(s): LIPASE, AMYLASE in the last 8760 hours. No results for input(s): AMMONIA in the last 8760 hours. CBC: Recent Labs    12/06/16 1023  02/13/17 1115 03/18/17 1220 11/11/17 0810  WBC 9.7  --  5.1 6.1 4.6  NEUTROABS 8.6*  --  3,927 5.0  --   HGB 11.1*   < > 11.6* 11.8* 11.1*  HCT 33.6*   < > 34.1* 34.9* 32.0*  MCV 94.9  --  93.2 91.8 92.5  PLT 144*  --  216 180 178   < > = values in this interval not displayed.   Cardiac Enzymes: No results for input(s): CKTOTAL, CKMB, CKMBINDEX, TROPONINI in the last 8760 hours. BNP: Invalid input(s): POCBNP Lab Results  Component Value Date    HGBA1C 5.2 08/19/2013   Lab Results  Component Value Date   TSH 2.55 02/13/2017   Lab Results  Component Value Date   VITAMINB12 1,314 (H) 11/17/2013   No results found for: FOLATE No results found for: IRON, TIBC, FERRITIN  Lipid Panel: No results for input(s): CHOL, HDL, LDLCALC, TRIG, CHOLHDL, LDLDIRECT in the last 8760 hours. Lab Results  Component Value Date   HGBA1C 5.2 08/19/2013    Procedures since last visit: No results found.  Assessment/Plan  1. GAD (generalized anxiety disorder) Change alprazolam to 0.25 mg in morning and noon with 0.5 mg at bedtime. Add additional 0.25 mg at noon for severe anxiety/ agitation  2. Noninfected skin tear of left lower extremity, sequela Continue skin care with NS, apply  non adherent dressing and wrap with kerlix wrap for now. Monitor for signs of infection  3. Dementia with behavioral disturbance, unspecified dementia type Supportive care and monitor his behavior. Continue celexa and prn alprazolam    Labs/tests ordered:  none  Next appointment: has routine follow up visit  Communication: reviewed care plan with patient's wife    Blanchie Serve, MD Internal Medicine [address] [place], [place] [address] Cell Phone (Monday-Friday 8 am - 5 pm): [phone] On Call: [phone] and follow prompts after 5 pm and on weekends Office Phone: [phone] Office Fax: [phone]

## 2017-12-03 DIAGNOSIS — H401133 Primary open-angle glaucoma, bilateral, severe stage: Secondary | ICD-10-CM | POA: Diagnosis not present

## 2017-12-10 IMAGING — CT CT HEAD W/O CM
5 of 8 series · 17 of 47 positions shown, 18 images · non-contrast
Comparison: Head and cervical spine CT 06/01/2016 and earlier.

CLINICAL DATA: [AGE] male with increased confusion and fall
today. Unequal pupils.

EXAM:
CT HEAD WITHOUT CONTRAST
CT CERVICAL SPINE WITHOUT CONTRAST
TECHNIQUE: Multidetector CT imaging of the head and cervical spine was
performed following the standard protocol without intravenous
contrast. Multiplanar CT image reconstructions of the cervical spine
were also generated.

[Series 4: head without · axial · non-contrast · 0.48mm/px · z∈[-119,+41]mm · 3 of 33 slices shown, 4 images]
[im 1/33  brain]
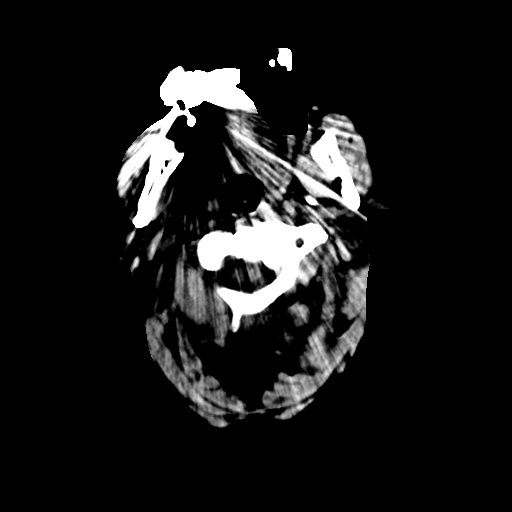
[im 1/33  bone]
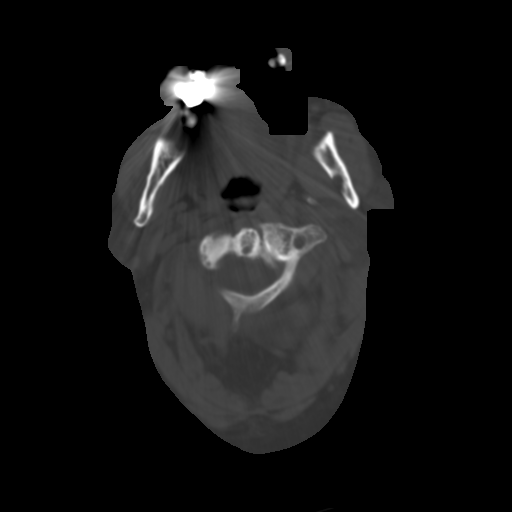
[im 17/33  brain]
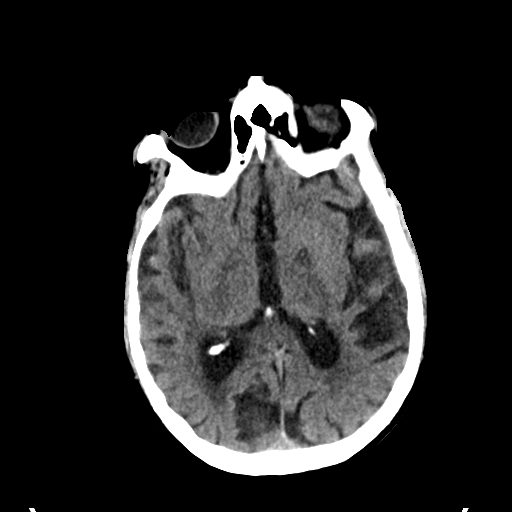
[im 33/33  brain]
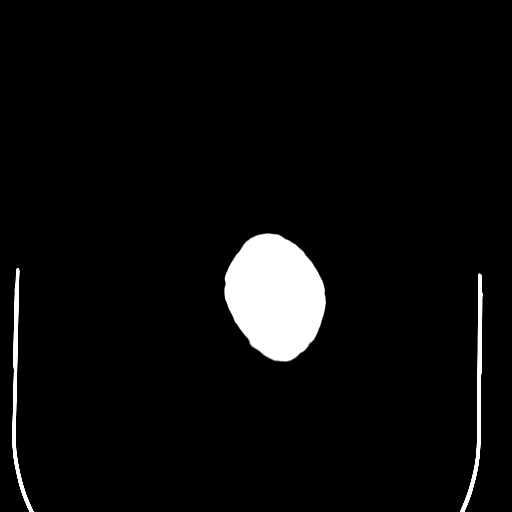

[Series 5: head bone · axial · 0.48mm/px · z∈[-97,+19]mm · 6 of 82 slices shown]
[im 12/82  bone]
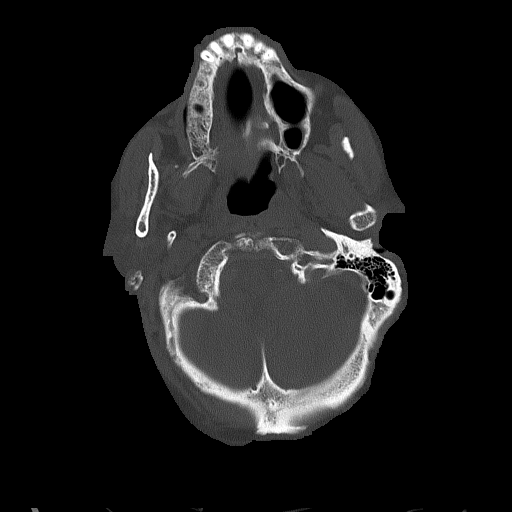
[im 24/82  bone]
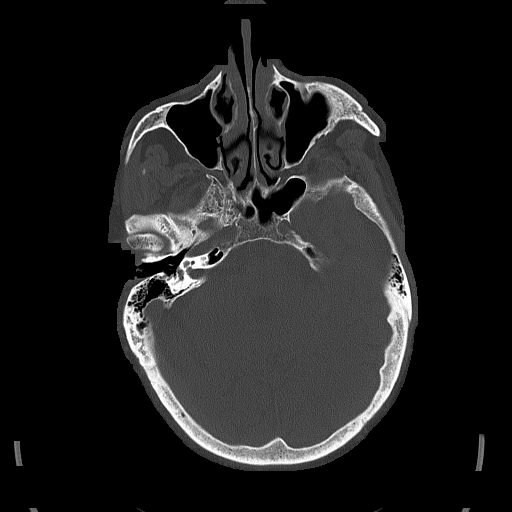
[im 35/82  bone]
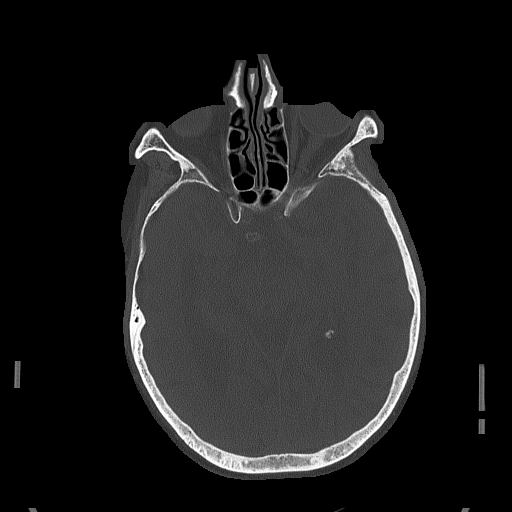
[im 47/82  bone]
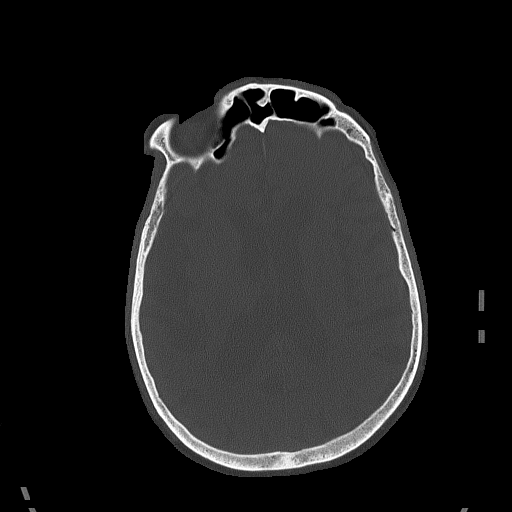
[im 58/82  bone]
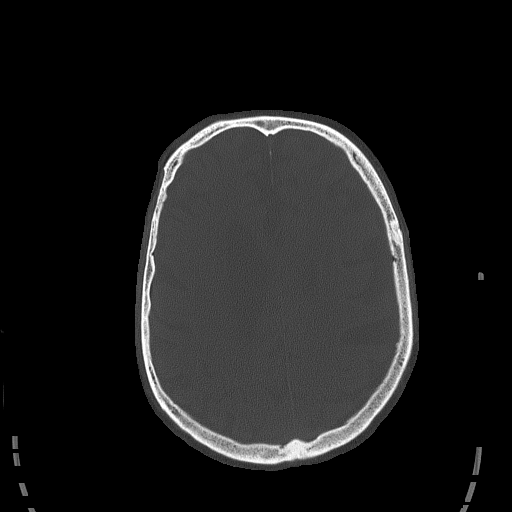
[im 70/82  bone]
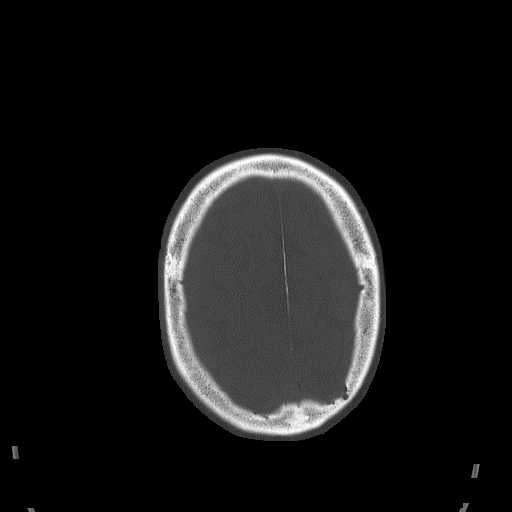

[Series 6: head without cor · coronal · non-contrast · 0.35mm/px · 3 of 74 slices shown]
[im 11/74  brain]
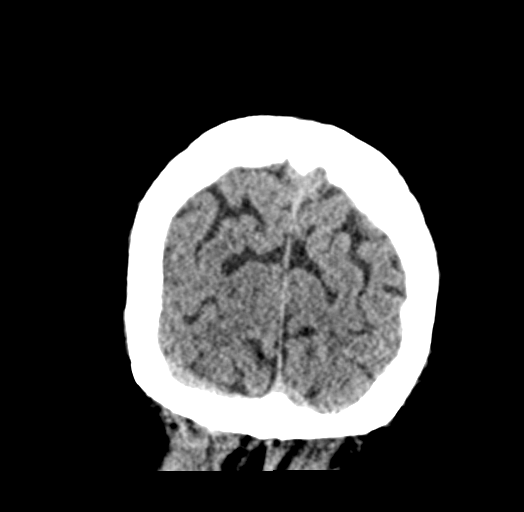
[im 16/74  brain]
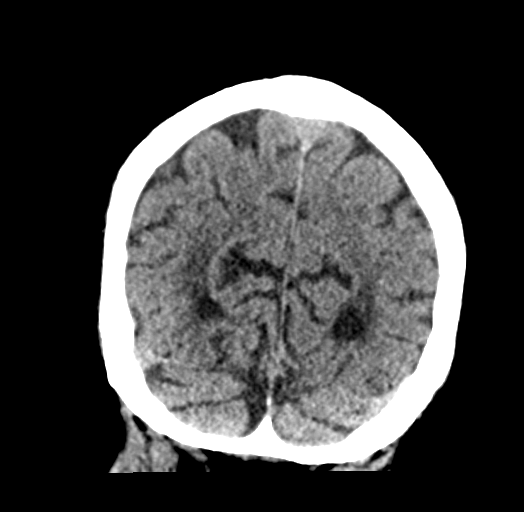
[im 21/74  brain]
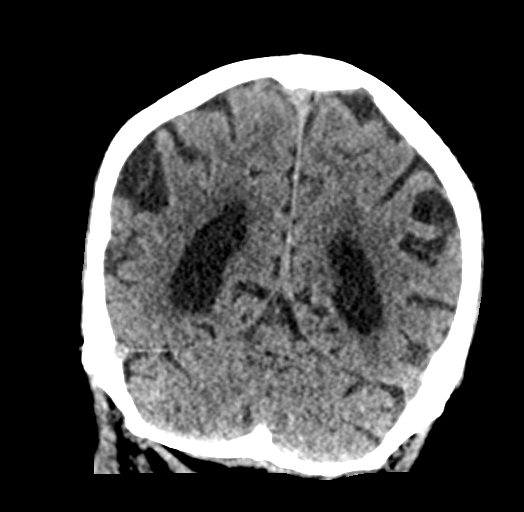

[Series 7: head without sag · sagittal · non-contrast · 0.33mm/px · 1 of 59 slices shown]
[im 30/59  brain]
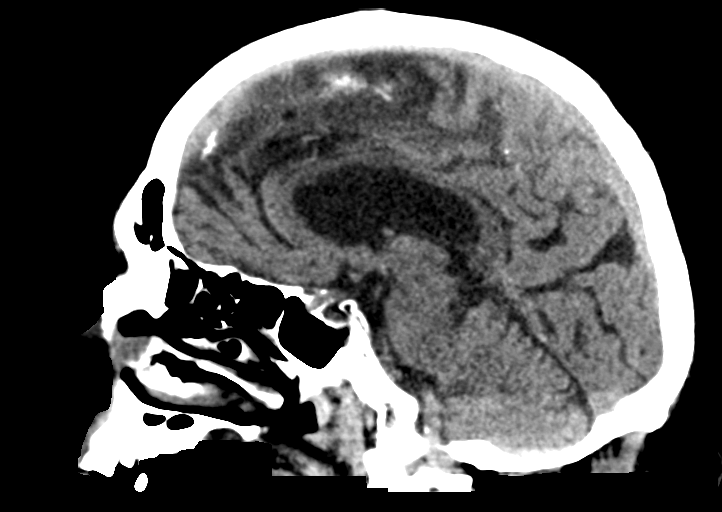

[Series 8: c_spine 2.0 st · axial · 0.37mm/px · z∈[-252,-186]mm · 4 of 100 slices shown]
[im 12/100  brain]
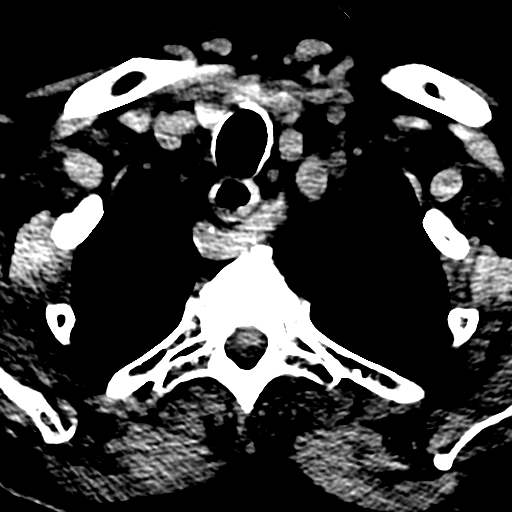
[im 23/100  brain]
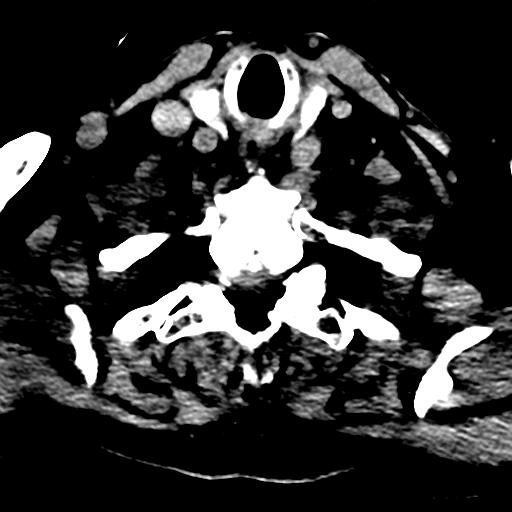
[im 34/100  brain]
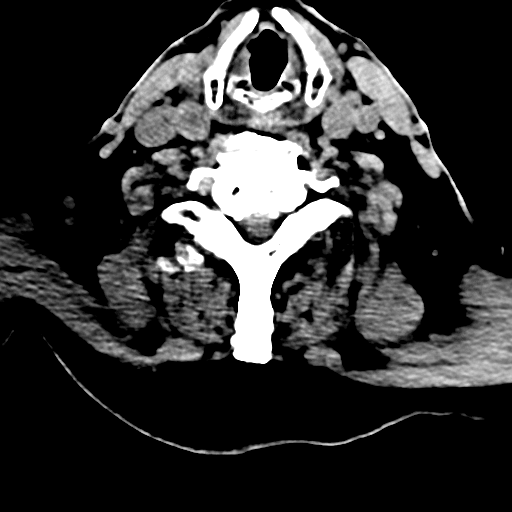
[im 45/100  brain]
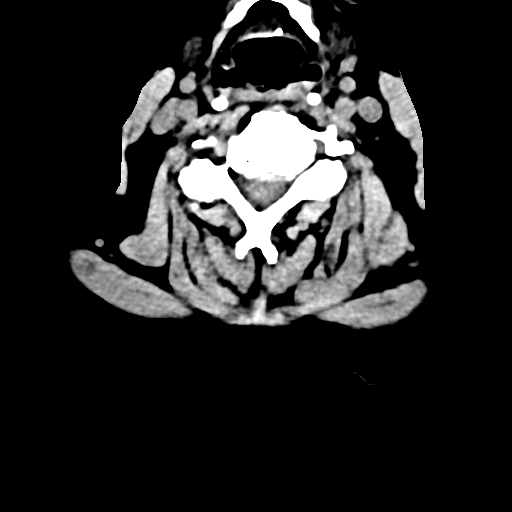

[17 of 47 positions shown; findings below may reference images not displayed]

FINDINGS: CT HEAD FINDINGS

Brain: Stable cerebral volume. No midline shift, ventriculomegaly,
mass effect, evidence of mass lesion, intracranial hemorrhage or
evidence of cortically based acute infarction. Stable gray-white
matter differentiation throughout the brain. Chronic lacunar
infarcts in the left deep gray matter nuclei.

Vascular: Calcified atherosclerosis at the skull base.

Skull: Intact.  No acute osseous abnormality identified.

Sinuses/Orbits: Visualized paranasal sinuses and mastoids are stable
and well pneumatized.

Other: Mild right forehead scalp soft tissue scarring suspected from
prior 2578 hematoma there. No acute scalp hematoma identified. No
acute orbit soft tissue finding.

CT CERVICAL SPINE FINDINGS

Alignment: Stable. Chronic anterolisthesis patch that chronic
anterolisthesis of C3 on C4 and C7 on T1. Bilateral posterior
element alignment is within normal limits.

Skull base and vertebrae: Stable bone mineralization. Visualized
skull base is intact. No atlanto-occipital dissociation. No cervical
spine fracture identified.

Soft tissues and spinal canal: No prevertebral fluid or swelling. No
visible canal hematoma.

Stable and negative noncontrast neck soft tissues.

Disc levels: Advanced lower cervical disc and endplate degeneration.
Advanced chronic facet degeneration on the right at C3-C4 and
bilaterally at C7-T1. Multilevel chronic degenerative cervical
spinal stenosis.

Upper chest: Visible upper thoracic levels appear grossly stable and
intact.

Partially visible Calcified aortic atherosclerosis. And probable
aneurysmal proximal descending thoracic aorta with aberrant right
subclavian artery origin. Negative lung apices.
IMPRESSION: 1. No acute intracranial abnormality. Stable non contrast CT
appearance of the brain.
2. No acute fracture or listhesis identified in the cervical spine.
Advanced chronic cervical spine degeneration and chronic
degenerative spinal stenosis.
3. Calcified aortic atherosclerosis with probable chronic aneurysmal
enlargement of the distal arch and proximal descending thoracic
aorta.

## 2017-12-18 ENCOUNTER — Other Ambulatory Visit: Payer: Self-pay | Admitting: *Deleted

## 2017-12-18 ENCOUNTER — Other Ambulatory Visit: Payer: Self-pay

## 2017-12-18 MED ORDER — ALPRAZOLAM 0.5 MG PO TABS: ORAL_TABLET | ORAL | 0 refills | Status: DC

## 2017-12-18 NOTE — Telephone Encounter (Signed)
[employer] verified.  LR- 08/08/17

## 2017-12-18 NOTE — Telephone Encounter (Signed)
Rx called in as directed.   

## 2018-01-14 ENCOUNTER — Other Ambulatory Visit: Payer: Self-pay | Admitting: Neurology

## 2018-01-15 ENCOUNTER — Ambulatory Visit (INDEPENDENT_AMBULATORY_CARE_PROVIDER_SITE_OTHER): Payer: Medicare Other | Admitting: Adult Health

## 2018-01-15 ENCOUNTER — Encounter: Payer: Self-pay | Admitting: Adult Health

## 2018-01-15 VITALS — BP 129/58 | HR 64

## 2018-01-15 DIAGNOSIS — F0391 Unspecified dementia with behavioral disturbance: Secondary | ICD-10-CM | POA: Diagnosis not present

## 2018-01-15 NOTE — Patient Instructions (Signed)
Your Plan:  Continue to monitor symptoms If your symptoms worsen or you develop new symptoms please let us know.   Thank you for coming to see us at [facility]. I hope we have been able to provide you high quality care today.  You may receive a patient satisfaction survey over the next few weeks. We would appreciate your feedback and comments so that we may continue to improve ourselves and the health of our patients.   

## 2018-01-15 NOTE — Progress Notes (Signed)
I have read the note, and I agree with the clinical assessment and plan.  Arbutus Nelligan K Jawanna Dykman   

## 2018-01-15 NOTE — Progress Notes (Signed)
PATIENT: Bryan Moore DOB: 1920/03/02  REASON FOR VISIT: follow up HISTORY FROM: patient  HISTORY OF PRESENT ILLNESS: Today 01/15/18: [name] is a 84 year old male with a history of dementia and agitation.  Returns today for follow-up.  He continues to live at [facility] with his wife.  They do have a caregiver that is with him daily.  He requires assistance with all ADLs.  He primarily uses a wheelchair to ambulate.  He will ambulate small distances with a walker but does need 1 person assist.  Caregiver reports good appetite.  Denies any trouble sleeping.  The patient does have a history of agitation.  Wife reports that this continues to be controlled with Xanax.  Patient returns today for evaluation.  HISTORY 07/04/17 Mr.Bryan Moore is a 84 year old male with a history of dementia and agitation.  He returns today for follow-up.  The patient is currently not on any memory medication.  He is on Celexa 10 mg daily for agitation.  His wife reports that the patient has remained stable.  He lives at home with his wife.  He does have a caregiver that helps him throughout the day.  He is able to complete all ADLs independently.  He uses a walker for only short distances but primarily uses a wheelchair.  Denies any trouble sleeping.  The wife reports sometimes he is very active in his sleep.  Reports good appetite.  The wife reports that he does have chronic diarrhea and they have been unable to determine a cause  The wife reports that his agitation is controlled with Xanax.  Overall she feels that the patient is doing well.   REVIEW OF SYSTEMS: Out of a complete 14 system review of symptoms, the patient complains only of the following symptoms, and all other reviewed systems are negative.  ALLERGIES: Allergies  Allergen Reactions  . Advil [Ibuprofen] Other (See Comments)    Causes stomach bleeding, need transfusions as result  . Aspirin Other (See Comments)    Causes stomach bleeding, needs  transfusion as result  . Penicillins Itching and Swelling    Has patient had a PCN reaction causing immediate rash, facial/tongue/throat swelling, SOB or lightheadedness with hypotension: Unsure Has patient had a PCN reaction causing severe rash involving mucus membranes or skin necrosis: Unsure  Has patient had a PCN reaction that required hospitalization Yes Has patient had a PCN reaction occurring within the last 10 years: No If all of the above answers are "NO", then may proceed with Cephalosporin use.  . Aricept [Donepezil]     Listed on face sheet  . Atorvastatin     Joint ache  . Famciclovir     Listed on face sheet  . Namenda [Memantine]     Stomach pain  . Other Other (See Comments)    No blood thinners due to GI bleed.  . Ativan [Lorazepam] Anxiety    He became very anxious and aggitated    HOME MEDICATIONS: Outpatient Medications Prior to Visit  Medication Sig Dispense Refill  . ALPRAZolam (XANAX) 0.5 MG tablet TAKE 1/2 TABLET BY MOUTH EVERY AM,1/2 TABLET AT NOON THEN TAKE 1 TABLET AT BEDTIME. Provide additional half a tablet in afternoon if needed for severe anxiety/ restlessness 70 tablet 0  . amLODipine (NORVASC) 5 MG tablet Take 5 mg by mouth daily.    . cholecalciferol (VITAMIN D) 1000 units tablet Take 1,000 Units by mouth daily.    . citalopram (CELEXA) 20 MG tablet Take 20  mg by mouth daily.     . iron polysaccharides (NIFEREX) 150 MG capsule Take 150 mg by mouth every morning.     . latanoprost (XALATAN) 0.005 % ophthalmic solution Place 1 drop into both eyes at bedtime.    Marland Kitchen loperamide (IMODIUM A-D) 2 MG tablet Take 2 mg by mouth every other day.     Marland Kitchen UNABLE TO FIND Med Name: Tylenol liquid 1 tablespoons 2-3 times to day.     No facility-administered medications prior to visit.     PAST MEDICAL HISTORY: Past Medical History:  Diagnosis Date  . Abnormality of gait 06/07/2016  . Anemia    from AVM's. Requiring transfusion  . Aortic stenosis    s/p TAVI  July 2012  . Arthritis   . Balance problem    WEAK ON RIGHT SIDE ( SCOLISOSIS AND HX OF A BACK SURGERY) - BALANCE PROBLEM - "FALLS BACKWARD" - FREQUENT FALLS.  USES CANE WHEN AMBULATING  . Cancer (HCC)    BLADDER CANCER  . Chronic kidney disease   . Complication of anesthesia    VERY DROWSY FOR HOURS AFTER SURGERY - AND DISORIENTATION  . Dementia    more memory problems from stroke  . Fall   . GERD (gastroesophageal reflux disease)    RARE - AND NO MEDS  . GI bleed   . Glaucoma   . Glaucoma   . Heart murmur    PT'S CARDIOLOGIST IS DR. Acie Fredrickson -LAST OFFICE VISIT 03/02/13 IN [facility]  . History of blood transfusion LAST 2-3 YRS AGO   HAD 4 OR 5   . History of cerebral infarction 08/19/2013  . Hypercholesteremia   . Hyperlipemia   . Hypertension    Not taking Lisinopril due to BP drops low.  . Memory loss   . Mitral regurgitation   . PONV (postoperative nausea and vomiting)    IN PAST, DID WELL WITH LAST SURGERY  . Shingles May 2013   right arm residual pain- last outbreak 1 1/2 yrs ago.  . Stroke ([place])    left internal capsule 1/15  . Transfusion history    last 2 yrs ago.    PAST SURGICAL HISTORY: Past Surgical History:  Procedure Laterality Date  . AORTIC VALVE REPLACEMENT     02/2011([facility])  . APPENDECTOMY    . BACK SURGERY  2010 OR 2011   LOWER  . CARDIAC CATHETERIZATION  10/23/2010   ef 65%  . CARDIOVASCULAR STRESS TEST  01/01/2007   EF 64%, NO EVIDENCE OF ISCHEMIA  . CYSTOSCOPY W/ RETROGRADES Bilateral 09/17/2013   Procedure: CYSTOSCOPY WITH BILATERAL RETROGRADE;  Surgeon: [name], MD;  Location: [facility];  Service: Urology;  Laterality: Bilateral;  . CYSTOSCOPY W/ RETROGRADES Bilateral 11/15/2015   Procedure: CYSTO, BILATERAL RETROGRADE PYELOGRAM WITH BLADDER BIOPSY;  Surgeon: [name], MD;  Location: [facility];  Service: Urology;  Laterality: Bilateral;  . CYSTOSCOPY WITH STENT PLACEMENT Left 04/13/2013   Procedure:  LEFT URETERAL STENT PLACEMENT;  Surgeon: [name], MD;  Location: [facility];  Service: Urology;  Laterality: Left;  . EYE SURGERY     bilateral cataracts with lens implants  . TRANSURETHRAL RESECTION OF BLADDER TUMOR N/A 06/04/2013   Procedure: RESTAGING TRANSURETHRAL RESECTION OF BLADDER TUMOR (TURBT), URETERAL CATHERIZATION ;  Surgeon: [name], MD;  Location: [facility];  Service: Urology;  Laterality: N/A;  CYSTO RESTAGING TURBT     . TRANSURETHRAL RESECTION OF BLADDER TUMOR N/A 09/17/2013   Procedure: TRANSURETHRAL RESECTION OF  BLADDER TUMOR WITH MITOMYCIN-C;  Surgeon: [name], MD;  Location: [facility];  Service: Urology;  Laterality: N/A;  . TRANSURETHRAL RESECTION OF BLADDER TUMOR WITH GYRUS (TURBT-GYRUS) N/A 04/13/2013   Procedure: TRANSURETHRAL RESECTION OF BLADDER TUMOR WITH GYRUS (TURBT-GYRUS);  Surgeon: [name], MD;  Location: [facility];  Service: Urology;  Laterality: N/A;  . US ECHOCARDIOGRAPHY  08/31/10   EF 55-60%    FAMILY HISTORY: Family History  Problem Relation Age of Onset  . COPD Father   . Stroke Father   . Parkinson's disease Brother 14  . Heart failure Mother   . Bowel Disease Sister 6    SOCIAL HISTORY: Social History   Socioeconomic History  . Marital status: Married    Spouse name: Not on file  . Number of children: 2  . Years of education: College  . Highest education level: Not on file  Occupational History  . Occupation: retired Copy  . Financial resource strain: Not on file  . Food insecurity:    Worry: Not on file    Inability: Not on file  . Transportation needs:    Medical: Not on file    Non-medical: Not on file  Tobacco Use  . Smoking status: Former Smoker    Types: Pipe    Last attempt to quit: 07/04/1981    Years since quitting: 36.5  . Smokeless tobacco: Never Used  Substance and Sexual Activity  . Alcohol use: Not Currently    Alcohol/week: 0.6 oz    Types: 1 Glasses of wine per week  . Drug use: No  . Sexual activity: Not Currently  Lifestyle  .  Physical activity:    Days per week: Not on file    Minutes per session: Not on file  . Stress: Not on file  Relationships  . Social connections:    Talks on phone: Not on file    Gets together: Not on file    Attends religious service: Not on file    Active member of club or organization: Not on file    Attends meetings of clubs or organizations: Not on file    Relationship status: Not on file  . Intimate partner violence:    Fear of current or ex partner: Not on file    Emotionally abused: Not on file    Physically abused: Not on file    Forced sexual activity: Not on file  Other Topics Concern  . Not on file  Social History Narrative   Lives at [facility] 03/13/2016   Patient lives at home with his wife has 2 children.   Married 1960.   Patient is left handed.   Patient has college education.   Patient drink 4 cups daily.   Smoked pipe 20-35 years    Exercise no         PHYSICAL EXAM  Vitals:   01/15/18 1119  BP: (!) 129/58  Pulse: 64   There is no height or weight on file to calculate BMI.  Generalized: Well developed, in no acute distress   Neurological examination  Mentation: Alert but lethargic today. Follows commands intermittently Cranial nerve II-XII: Pupils were equal round reactive to light. Extraocular movements were full, visual field were full on confrontational test. Facial sensation and strength were normal. Uvula tongue midline. Head turning and shoulder shrug  were normal and symmetric. Motor: The motor testing reveals 5 over 5 strength of all 4 extremities. Good symmetric motor tone  is noted throughout.  Sensory: Sensory testing is intact to soft touch on all 4 extremities. No evidence of extinction is noted.  Coordination: Cerebellar testing reveals good finger-nose-finger and heel-to-shin bilaterally.  Difficulty following directions Gait and station: Patient is in a wheelchair.  DIAGNOSTIC DATA (LABS, IMAGING, TESTING) - I reviewed  patient records, labs, notes, testing and imaging myself where available.  Lab Results  Component Value Date   WBC 4.6 11/11/2017   HGB 11.1 (L) 11/11/2017   HCT 32.0 (L) 11/11/2017   MCV 92.5 11/11/2017   PLT 178 11/11/2017      Component Value Date/Time   NA 137 11/11/2017 0810   K 4.7 11/11/2017 0810   CL 104 11/11/2017 0810   CO2 27 11/11/2017 0810   GLUCOSE 78 11/11/2017 0810   BUN 26 (H) 11/11/2017 0810   CREATININE 1.49 (H) 11/11/2017 0810   CALCIUM 9.0 11/11/2017 0810   PROT 7.2 11/11/2017 0810   ALBUMIN 3.5 (L) 03/20/2017 0814   AST 12 11/11/2017 0810   ALT 6 (L) 11/11/2017 0810   ALKPHOS 63 03/20/2017 0814   BILITOT 0.5 11/11/2017 0810   GFRNONAA 39 (L) 11/11/2017 0810   GFRAA 46 (L) 11/11/2017 0810   Lab Results  Component Value Date   CHOL 167 09/24/2016   HDL 46 09/24/2016   LDLCALC 98 09/24/2016   TRIG 114 09/24/2016   CHOLHDL 3.6 09/24/2016   Lab Results  Component Value Date   HGBA1C 5.2 08/19/2013   Lab Results  Component Value Date   VITAMINB12 1,314 (H) 11/17/2013   Lab Results  Component Value Date   TSH 2.55 02/13/2017      ASSESSMENT AND PLAN 84 y.o. year old male  has a past medical history of Abnormality of gait (06/07/2016), Anemia, Aortic stenosis, Arthritis, Balance problem, Cancer ([place]), Chronic kidney disease, Complication of anesthesia, Dementia, Fall, GERD (gastroesophageal reflux disease), GI bleed, Glaucoma, Glaucoma, Heart murmur, History of blood transfusion (LAST 2-3 YRS AGO), History of cerebral infarction (08/19/2013), Hypercholesteremia, Hyperlipemia, Hypertension, Memory loss, Mitral regurgitation, PONV (postoperative nausea and vomiting), Shingles (May 2013), Stroke [facility]), and Transfusion history. here with :  1.  Dementia with agitation  Overall the patient has remained relatively stable.  We did not complete a memory test on him today.  His previous score was 5 out of 30.  He is currently not on any memory medication.   He is receiving all his prescriptions through his primary care provider.  I have advised that if his symptoms worsen or he develops new symptoms he should let us know.  The patient will continue to follow-up with his primary care provider.  He will follow-up with [place] on an as-needed basis.   I spent 15 minutes with the patient. 50% of this time was spent discussing his symptoms and plan of care   Ward Givens, MSN, NP-C 01/15/2018, 11:25 AM [facility] [address] [place], [place] [address] [phone]

## 2018-01-16 ENCOUNTER — Telehealth: Payer: Self-pay | Admitting: Family

## 2018-01-16 NOTE — Telephone Encounter (Signed)
Patient's wife here in the clinic states went to the pharmacy to pick up Celexa prescription but pharmacy stated no refill.spoke with pharmacy states prescription was denied by Neuro patient to be seen 01/15/2018.Wife states requested all med to be managed by PCP Dr. Bubba Camp.Celexa 20 mg tablet one by mouth daily with 3 refills to be refilled today.patient's wife will pickup prescription.

## 2018-01-17 ENCOUNTER — Other Ambulatory Visit: Payer: Self-pay | Admitting: *Deleted

## 2018-01-17 ENCOUNTER — Other Ambulatory Visit: Payer: Self-pay | Admitting: Internal Medicine

## 2018-01-17 MED ORDER — ALPRAZOLAM 0.5 MG PO TABS: ORAL_TABLET | ORAL | 0 refills | Status: DC

## 2018-01-17 NOTE — Addendum Note (Signed)
Addended by: Royann Shivers A on: 01/17/2018 02:50 PM   Modules accepted: Orders

## 2018-01-17 NOTE — Addendum Note (Signed)
Addended by: Royann Shivers A on: 01/17/2018 03:11 PM   Modules accepted: Orders

## 2018-01-17 NOTE — Telephone Encounter (Signed)
Ok to refill? Last filled 12/18/17 #[id number].

## 2018-02-16 ENCOUNTER — Other Ambulatory Visit: Payer: Self-pay | Admitting: Internal Medicine

## 2018-02-17 NOTE — Telephone Encounter (Signed)
[place] Database verified Last filled 01/17/18

## 2018-03-05 DIAGNOSIS — N183 Chronic kidney disease, stage 3 (moderate): Secondary | ICD-10-CM | POA: Diagnosis not present

## 2018-03-05 DIAGNOSIS — I1310 Hypertensive heart and chronic kidney disease without heart failure, with stage 1 through stage 4 chronic kidney disease, or unspecified chronic kidney disease: Secondary | ICD-10-CM | POA: Diagnosis not present

## 2018-03-05 DIAGNOSIS — D638 Anemia in other chronic diseases classified elsewhere: Secondary | ICD-10-CM | POA: Diagnosis not present

## 2018-03-06 DIAGNOSIS — D638 Anemia in other chronic diseases classified elsewhere: Secondary | ICD-10-CM

## 2018-03-06 DIAGNOSIS — I1310 Hypertensive heart and chronic kidney disease without heart failure, with stage 1 through stage 4 chronic kidney disease, or unspecified chronic kidney disease: Secondary | ICD-10-CM

## 2018-03-06 DIAGNOSIS — N183 Chronic kidney disease, stage 3 unspecified: Secondary | ICD-10-CM

## 2018-03-06 LAB — LIPID PANEL
CHOLESTEROL: 177 mg/dL (ref <200)
HDL: 45 mg/dL (ref >40)
LDL CHOLESTEROL (CALC): 114 mg/dL — AB
Non-HDL Cholesterol (Calc): 132 mg/dL (calc) — ABNORMAL HIGH (ref <130)
Total CHOL/HDL Ratio: 3.9 (calc) (ref <5.0)
Triglycerides: 85 mg/dL (ref <150)

## 2018-03-06 LAB — COMPLETE METABOLIC PANEL WITH GFR
AG Ratio: 1.1 (calc) (ref 1.0–2.5)
ALBUMIN MSPROF: 4 g/dL (ref 3.6–5.1)
ALKALINE PHOSPHATASE (APISO): 80 U/L (ref 40–115)
ALT: 9 U/L (ref 9–46)
AST: 14 U/L (ref 10–35)
BUN / CREAT RATIO: 20 (calc) (ref 6–22)
BUN: 30 mg/dL — ABNORMAL HIGH (ref 7–25)
CO2: 25 mmol/L (ref 20–32)
CREATININE: 1.47 mg/dL — AB (ref 0.70–1.11)
Calcium: 9.1 mg/dL (ref 8.6–10.3)
Chloride: 104 mmol/L (ref 98–110)
GFR, EST NON AFRICAN AMERICAN: 40 mL/min/{1.73_m2} — AB (ref >=60)
GFR, Est African American: 46 mL/min/{1.73_m2} — ABNORMAL LOW (ref >=60)
GLOBULIN: 3.7 g/dL (ref 1.9–3.7)
GLUCOSE: 82 mg/dL (ref 65–99)
Potassium: 4.5 mmol/L (ref 3.5–5.3)
SODIUM: 138 mmol/L (ref 135–146)
Total Bilirubin: 0.5 mg/dL (ref 0.2–1.2)
Total Protein: 7.7 g/dL (ref 6.1–8.1)

## 2018-03-06 LAB — CBC
HCT: 35.3 % — ABNORMAL LOW (ref 38.5–50.0)
Hemoglobin: 12 g/dL — ABNORMAL LOW (ref 13.2–17.1)
MCH: 31.7 pg (ref 27.0–33.0)
MCHC: 34 g/dL (ref 32.0–36.0)
MCV: 93.4 fL (ref 80.0–100.0)
MPV: 10.1 fL (ref 7.5–12.5)
PLATELETS: 191 10*3/uL (ref 140–400)
RBC: 3.78 10*6/uL — ABNORMAL LOW (ref 4.20–5.80)
RDW: 13.1 % (ref 11.0–15.0)
WBC: 4.3 10*3/uL (ref 3.8–10.8)

## 2018-03-11 ENCOUNTER — Other Ambulatory Visit: Payer: Self-pay | Admitting: Internal Medicine

## 2018-03-12 ENCOUNTER — Encounter: Payer: Self-pay | Admitting: Internal Medicine

## 2018-03-19 ENCOUNTER — Non-Acute Institutional Stay: Payer: Medicare Other | Admitting: Internal Medicine

## 2018-03-19 ENCOUNTER — Encounter: Payer: Self-pay | Admitting: Internal Medicine

## 2018-03-19 VITALS — BP 124/60 | HR 64 | Temp 97.4°F | Resp 18 | Ht 67.0 in | Wt 143.8 lb

## 2018-03-19 DIAGNOSIS — F1320 Sedative, hypnotic or anxiolytic dependence, uncomplicated: Secondary | ICD-10-CM

## 2018-03-19 DIAGNOSIS — N183 Chronic kidney disease, stage 3 unspecified: Secondary | ICD-10-CM

## 2018-03-19 DIAGNOSIS — F0281 Dementia in other diseases classified elsewhere with behavioral disturbance: Secondary | ICD-10-CM | POA: Diagnosis not present

## 2018-03-19 DIAGNOSIS — F02818 Dementia in other diseases classified elsewhere, unspecified severity, with other behavioral disturbance: Secondary | ICD-10-CM

## 2018-03-19 DIAGNOSIS — G301 Alzheimer's disease with late onset: Secondary | ICD-10-CM

## 2018-03-19 DIAGNOSIS — E785 Hyperlipidemia, unspecified: Secondary | ICD-10-CM

## 2018-03-19 DIAGNOSIS — F418 Other specified anxiety disorders: Secondary | ICD-10-CM

## 2018-03-19 DIAGNOSIS — R269 Unspecified abnormalities of gait and mobility: Secondary | ICD-10-CM

## 2018-03-19 DIAGNOSIS — I1310 Hypertensive heart and chronic kidney disease without heart failure, with stage 1 through stage 4 chronic kidney disease, or unspecified chronic kidney disease: Secondary | ICD-10-CM

## 2018-03-19 MED ORDER — CITALOPRAM HYDROBROMIDE 20 MG PO TABS: ORAL_TABLET | ORAL | 3 refills | Status: DC

## 2018-03-19 MED ORDER — ALPRAZOLAM 0.5 MG PO TABS: ORAL_TABLET | ORAL | 0 refills | Status: DC

## 2018-03-19 NOTE — Progress Notes (Signed)
[facility]  Provider: Blanchie Serve MD   Location:  [facility] of Service:  Clinic (12)  PCP: Blanchie Serve, MD Patient Care Team: Blanchie Serve, MD as PCP - General (Internal Medicine) Ronald Lobo, MD (Gastroenterology) Rolm Bookbinder, MD as Consulting Physician (Dermatology) Nahser, [name], MD as Consulting Physician (Cardiology) Kathrynn Ducking, MD as Consulting Physician (Neurology) [name], MD as Attending Physician (Urology) Luberta Mutter, MD as Consulting Physician (Ophthalmology) Blanchie Serve, MD as Consulting Physician (Internal Medicine)  Extended Emergency Contact Information Primary Emergency Contact: [facility] Address: [address]          [place], [place] [address] [name] of [place] Phone: [phone] Mobile Phone: [phone] Relation: Spouse Secondary Emergency Contact: Zaman,Jeffrey          DES Merla Riches of [place] Phone: [phone] Mobile Phone: [phone] Relation: Son  Code Status: DNR  Goals of Care: Advanced Directive information Advanced Directives 03/19/2018  Does Patient Have a Medical Advance Directive? Yes  Type of Paramedic of [place];Living will;Out of facility DNR (pink MOST or yellow form)  Does patient want to make changes to medical advance directive? No - Patient declined  Copy of [place] in Chart? Yes  Pre-existing out of facility DNR order (yellow form or pink MOST form) Yellow form placed in chart (order not valid for inpatient use)      Chief Complaint  Patient presents with  . Medical Management of Chronic Issues    4 month follow up. No acute concerns at this time.   . Medication Refill    Xanax ( pending)   . Results    Discuss labs    HPI: Patient is a 84 y.o. male seen today for routine visit. Pt here with his wife. He has advanced dementia and does not  participate in HPI and ROS. Per wife, he ran out of his alprazolam a week back as she was giving him additional half a tablet dosing at noon and then started giving him her medication. She mentions he has episodes of agitation and refusing of care at times at noon. No fall reported. No harm to self or others reported. Has not required his imodium. takes iron daily. Bowel movement every other day. No blood in stool. Tolerating citalopram well. Calm this visit at the office. Follows simple directions. He is tolerating blood pressure medication well.   Past Medical History:  Diagnosis Date  . Abnormality of gait 06/07/2016  . Anemia    from AVM's. Requiring transfusion  . Aortic stenosis    s/p TAVI July 2012  . Arthritis   . Balance problem    WEAK ON RIGHT SIDE ( SCOLISOSIS AND HX OF A BACK SURGERY) - BALANCE PROBLEM - "FALLS BACKWARD" - FREQUENT FALLS.  USES CANE WHEN AMBULATING  . Cancer (HCC)    BLADDER CANCER  . Chronic kidney disease   . Complication of anesthesia    VERY DROWSY FOR HOURS AFTER SURGERY - AND DISORIENTATION  . Dementia    more memory problems from stroke  . Fall   . GERD (gastroesophageal reflux disease)    RARE - AND NO MEDS  . GI bleed   . Glaucoma   . Glaucoma   . Heart murmur    PT'S CARDIOLOGIST IS DR. Acie Fredrickson -LAST OFFICE VISIT 03/02/13 IN [facility]  . History of blood transfusion LAST 2-3 YRS AGO  HAD 4 OR 5   . History of cerebral infarction 08/19/2013  . Hypercholesteremia   . Hyperlipemia   . Hypertension    Not taking Lisinopril due to BP drops low.  . Memory loss   . Mitral regurgitation   . PONV (postoperative nausea and vomiting)    IN PAST, DID WELL WITH LAST SURGERY  . Shingles May 2013   right arm residual pain- last outbreak 1 1/2 yrs ago.  . Stroke ([place])    left internal capsule 1/15  . Transfusion history    last 2 yrs ago.   Past Surgical History:  Procedure Laterality Date  . AORTIC VALVE REPLACEMENT     02/2011([facility])  .  APPENDECTOMY    . BACK SURGERY  2010 OR 2011   LOWER  . CARDIAC CATHETERIZATION  10/23/2010   ef 65%  . CARDIOVASCULAR STRESS TEST  01/01/2007   EF 64%, NO EVIDENCE OF ISCHEMIA  . CYSTOSCOPY W/ RETROGRADES Bilateral 09/17/2013   Procedure: CYSTOSCOPY WITH BILATERAL RETROGRADE;  Surgeon: [name], MD;  Location: [facility];  Service: Urology;  Laterality: Bilateral;  . CYSTOSCOPY W/ RETROGRADES Bilateral 11/15/2015   Procedure: CYSTO, BILATERAL RETROGRADE PYELOGRAM WITH BLADDER BIOPSY;  Surgeon: [name], MD;  Location: [facility];  Service: Urology;  Laterality: Bilateral;  . CYSTOSCOPY WITH STENT PLACEMENT Left 04/13/2013   Procedure:  LEFT URETERAL STENT PLACEMENT;  Surgeon: [name], MD;  Location: [facility];  Service: Urology;  Laterality: Left;  . EYE SURGERY     bilateral cataracts with lens implants  . TRANSURETHRAL RESECTION OF BLADDER TUMOR N/A 06/04/2013   Procedure: RESTAGING TRANSURETHRAL RESECTION OF BLADDER TUMOR (TURBT), URETERAL CATHERIZATION ;  Surgeon: [name], MD;  Location: [facility];  Service: Urology;  Laterality: N/A;  CYSTO RESTAGING TURBT     . TRANSURETHRAL RESECTION OF BLADDER TUMOR N/A 09/17/2013   Procedure: TRANSURETHRAL RESECTION OF BLADDER TUMOR WITH MITOMYCIN-C;  Surgeon: [name], MD;  Location: [facility];  Service: Urology;  Laterality: N/A;  . TRANSURETHRAL RESECTION OF BLADDER TUMOR WITH GYRUS (TURBT-GYRUS) N/A 04/13/2013   Procedure: TRANSURETHRAL RESECTION OF BLADDER TUMOR WITH GYRUS (TURBT-GYRUS);  Surgeon: [name], MD;  Location: [facility];  Service: Urology;  Laterality: N/A;  . US ECHOCARDIOGRAPHY  08/31/10   EF 55-60%    reports that he quit smoking about 36 years ago. His smoking use included pipe. He has never used smokeless tobacco. He reports that he drank about 1.0 standard drinks of alcohol per week. He reports that he does not use drugs. Social History   Socioeconomic History  . Marital status: Married    Spouse name: Not on file  . Number of  children: 2  . Years of education: College  . Highest education level: Not on file  Occupational History  . Occupation: retired Copy  . Financial resource strain: Not on file  . Food insecurity:    Worry: Not on file    Inability: Not on file  . Transportation needs:    Medical: Not on file    Non-medical: Not on file  Tobacco Use  . Smoking status: Former Smoker    Types: Pipe    Last attempt to quit: 07/04/1981    Years since quitting: 36.7  . Smokeless tobacco: Never Used  Substance and Sexual Activity  . Alcohol use: Not Currently    Alcohol/week: 1.0 standard drinks    Types: 1 Glasses of wine per week  .  Drug use: No  . Sexual activity: Not Currently  Lifestyle  . Physical activity:    Days per week: Not on file    Minutes per session: Not on file  . Stress: Not on file  Relationships  . Social connections:    Talks on phone: Not on file    Gets together: Not on file    Attends religious service: Not on file    Active member of club or organization: Not on file    Attends meetings of clubs or organizations: Not on file    Relationship status: Not on file  . Intimate partner violence:    Fear of current or ex partner: Not on file    Emotionally abused: Not on file    Physically abused: Not on file    Forced sexual activity: Not on file  Other Topics Concern  . Not on file  Social History Narrative   [facility] 03/13/2016   Patient lives at home with his wife has 2 children.   Married 1960.   Patient is left handed.   Patient has college education.   Patient drink 4 cups daily.   Smoked pipe 20-35 years    Exercise no       Functional Status Survey:    Family History  Problem Relation Age of Onset  . COPD Father   . Stroke Father   . Parkinson's disease Brother 47  . Heart failure Mother   . Bowel Disease Sister 53    Health Maintenance  Topic Date Due  . PNA vac Low Risk Adult (2 of 2 - PCV13) 03/06/2012  .  INFLUENZA VACCINE  03/06/2018  . TETANUS/TDAP  06/01/2026    Allergies  Allergen Reactions  . Advil [Ibuprofen] Other (See Comments)    Causes stomach bleeding, need transfusions as result  . Aspirin Other (See Comments)    Causes stomach bleeding, needs transfusion as result  . Penicillins Itching and Swelling    Has patient had a PCN reaction causing immediate rash, facial/tongue/throat swelling, SOB or lightheadedness with hypotension: Unsure Has patient had a PCN reaction causing severe rash involving mucus membranes or skin necrosis: Unsure  Has patient had a PCN reaction that required hospitalization Yes Has patient had a PCN reaction occurring within the last 10 years: No If all of the above answers are "NO", then may proceed with Cephalosporin use.  . Aricept [Donepezil]     Listed on face sheet  . Atorvastatin     Joint ache  . Famciclovir     Listed on face sheet  . Namenda [Memantine]     Stomach pain  . Other Other (See Comments)    No blood thinners due to GI bleed.  . Ativan [Lorazepam] Anxiety    He became very anxious and aggitated    Outpatient Encounter Medications as of 03/19/2018  Medication Sig  . ALPRAZolam (XANAX) 0.5 MG tablet SEE NOTES  . amLODipine (NORVASC) 5 MG tablet Take 5 mg by mouth daily.  . citalopram (CELEXA) 20 MG tablet TAKE 1 TABLET BY MOUTH EVERY DAY  . iron polysaccharides (NIFEREX) 150 MG capsule Take 150 mg by mouth every morning.   . latanoprost (XALATAN) 0.005 % ophthalmic solution Place 1 drop into both eyes at bedtime.  Marland Kitchen loperamide (IMODIUM A-D) 2 MG tablet Take 2 mg by mouth as needed.   Marland Kitchen UNABLE TO FIND Med Name: Tylenol liquid 1 tablespoons 2-3 times to day.  . cholecalciferol (  VITAMIN D) 1000 units tablet Take 1,000 Units by mouth daily.   No facility-administered encounter medications on file as of 03/19/2018.     Review of Systems  Unable to perform ROS: Dementia (from his wife)  Constitutional: Negative for appetite  change and fever.  HENT: Negative for congestion and rhinorrhea.   Respiratory: Negative for cough and shortness of breath.   Cardiovascular: Negative for chest pain.  Gastrointestinal: Negative for abdominal pain, blood in stool, diarrhea and vomiting.  Genitourinary: Negative for dysuria and hematuria.  Musculoskeletal: Positive for gait problem.  Skin: Negative for wound.  Psychiatric/Behavioral: Positive for confusion and decreased concentration. The patient is nervous/anxious.     Vitals:   03/19/18 1110  BP: 124/60  Pulse: 64  Resp: 18  Temp: (!) 97.4 F (36.3 C)  TempSrc: Oral  SpO2: 99%  Weight: 143 lb 12.8 oz (65.2 kg)  Height: 5\' 7"  (1.702 m)   Body mass index is 22.52 kg/m.   Wt Readings from Last 3 Encounters:  03/19/18 143 lb 12.8 oz (65.2 kg)  11/27/17 145 lb 8 oz (66 kg)  11/07/17 145 lb 6.4 oz (66 kg)   Physical Exam  Constitutional: No distress.  Elderly male patient  HENT:  Head: Normocephalic and atraumatic.  Mouth/Throat: Oropharynx is clear and moist.  Eyes: Conjunctivae and EOM are normal. Right eye exhibits no discharge. Left eye exhibits no discharge.  Neck: Normal range of motion. Neck supple.  Cardiovascular: Normal rate and regular rhythm.  Pulmonary/Chest: Effort normal and breath sounds normal. No respiratory distress. He has no wheezes. He has no rales.  Abdominal: Soft. Bowel sounds are normal. There is no tenderness. There is no guarding.  Musculoskeletal:  Can move all 4 extremities. Trace edema  Lymphadenopathy:    He has no cervical adenopathy.  Neurological: He is alert.  Alert to his surroundings  Skin: Skin is warm and dry. He is not diaphoretic.  Psychiatric:  Calm at office visit, following simple commands    Labs reviewed: Basic Metabolic Panel: Recent Labs    03/20/17 0814 11/11/17 0810 03/05/18 0000  NA 142 137 138  K 3.4* 4.7 4.5  CL 106 104 104  CO2 24 27 25   GLUCOSE 83 78 82  BUN 16 26* 30*  CREATININE  1.28* 1.49* 1.47*  CALCIUM 8.8 9.0 9.1  MG 2.0  --   --    Liver Function Tests: Recent Labs    03/20/17 0814 11/11/17 0810 03/05/18 0000  AST 10 12 14   ALT 4* 6* 9  ALKPHOS 63  --   --   BILITOT 0.5 0.5 0.5  PROT 6.6 7.2 7.7  ALBUMIN 3.5*  --   --    No results for input(s): LIPASE, AMYLASE in the last 8760 hours. No results for input(s): AMMONIA in the last 8760 hours. CBC: Recent Labs    11/11/17 0810 03/05/18 0000  WBC 4.6 4.3  HGB 11.1* 12.0*  HCT 32.0* 35.3*  MCV 92.5 93.4  PLT 178 191   Cardiac Enzymes: No results for input(s): CKTOTAL, CKMB, CKMBINDEX, TROPONINI in the last 8760 hours. BNP: Invalid input(s): POCBNP Lab Results  Component Value Date   HGBA1C 5.2 08/19/2013   Lab Results  Component Value Date   TSH 2.55 02/13/2017   Lab Results  Component Value Date   VITAMINB12 1,314 (H) 11/17/2013   No results found for: FOLATE No results found for: IRON, TIBC, FERRITIN  Lipid Panel: Recent Labs    03/05/18 0000  CHOL 177  HDL 45  LDLCALC 114*  TRIG 85  CHOLHDL 3.9   Lab Results  Component Value Date   HGBA1C 5.2 08/19/2013    Procedures since last visit: No results found.  Assessment/Plan  1. Late onset Alzheimer's disease with behavioral disturbance Supportive care - citalopram (CELEXA) 20 MG tablet; Take one and a half tablet = 30 mg daily to help with his anxiety.  Dispense: 135 tablet; Refill: 3 - Pain Mgmt, Benzodiazepines Qn, U  2. CKD (chronic kidney disease) stage 3, GFR 30-59 ml/min (HCC) Reviewed labs, hydration encouraged.   3. Benzodiazepine dependence ([place]) This visit, wife demands for benzodiazepine dosing to be increased in dosing and quantity. He tends to run out of his medication a little earlier than prescribed period on most occasion. Also wife mentions giving pt his prescription. With concern for safety and compliance with medication, will obtain urine drug screen today. Increased citalopram dosing to help with  anxiety. Change alprazolam to 0.5 mg half a tablet in am, noon and bedtime instead of 1 at bedtime for now. Keep additional 0.25 mg if needed for severe anxiety episode. Advised wife/ caregiver not to provide more than prescribed amount and if in need for medication refill, to call the office. Also advised caregiver not to give her prescription strength to patient. Wife agrees.  - ALPRAZolam (XANAX) 0.5 MG tablet; SEE NOTES  Dispense: 60 tablet; Refill: 0 - Pain Mgmt, Benzodiazepines Qn, U  4. Depression with anxiety With wife's report of increased anxiety, change citalopram to 30 mg daily from 20 mg daily and monitor. Alprazolam changes as above.  - ALPRAZolam (XANAX) 0.5 MG tablet; SEE NOTES  Dispense: 60 tablet; Refill: 0 - citalopram (CELEXA) 20 MG tablet; Take one and a half tablet = 30 mg daily to help with his anxiety.  Dispense: 135 tablet; Refill: 3 - Pain Mgmt, Benzodiazepines Qn, U  5. Hypertensive heart and renal disease with renal failure, stage 1 through stage 4 or unspecified chronic kidney disease, without heart failure Continue amlodipine and monitor, BMP and LFT reviewed.   6. Hyperlipidemia, unspecified hyperlipidemia type Reviewed lipid panel, stable.   7. Abnormality of gait Fall precautions. 2 person Assistance with transfer to be provided.    Labs/tests ordered:  None  Next appointment: 3 months  Communication: reviewed care plan with patient and his wife    Blanchie Serve, MD Internal Medicine [place], [place] [address] Cell Phone (Monday-Friday 8 am - 5 pm): [phone] On Call: [phone] and follow prompts after 5 pm and on weekends Office Phone: [phone] Office Fax: [phone]

## 2018-03-23 LAB — PAIN MGMT, BENZODIAZEPINES QN, U
ALPHAHYDROXYMIDAZOLAM: NEGATIVE ng/mL (ref <50)
Alphahydroxyalprazolam: 394 ng/mL — ABNORMAL HIGH (ref <25)
Alphahydroxytriazolam: NEGATIVE ng/mL (ref <50)
Aminoclonazepam: NEGATIVE ng/mL (ref <25)
Hydroxyethylflurazepam: NEGATIVE ng/mL (ref <50)
Lorazepam: NEGATIVE ng/mL (ref <50)
NORDIAZEPAM: NEGATIVE ng/mL (ref <50)
Oxazepam: NEGATIVE ng/mL (ref <50)
TEMAZEPAM: NEGATIVE ng/mL (ref <50)

## 2018-04-05 ENCOUNTER — Other Ambulatory Visit: Payer: Self-pay | Admitting: Family

## 2018-04-18 ENCOUNTER — Other Ambulatory Visit: Payer: Self-pay | Admitting: *Deleted

## 2018-04-18 DIAGNOSIS — F418 Other specified anxiety disorders: Secondary | ICD-10-CM

## 2018-04-18 DIAGNOSIS — F1320 Sedative, hypnotic or anxiolytic dependence, uncomplicated: Secondary | ICD-10-CM

## 2018-04-18 MED ORDER — ALPRAZOLAM 0.5 MG PO TABS: ORAL_TABLET | ORAL | 0 refills | Status: DC

## 2018-04-18 NOTE — Telephone Encounter (Signed)
Ok to refill? Last filled 03/19/18 #60 0RF. Needs sent in today to be covered over the weekend.

## 2018-05-19 ENCOUNTER — Other Ambulatory Visit: Payer: Self-pay | Admitting: Family

## 2018-05-19 DIAGNOSIS — F1320 Sedative, hypnotic or anxiolytic dependence, uncomplicated: Secondary | ICD-10-CM

## 2018-05-19 DIAGNOSIS — F418 Other specified anxiety disorders: Secondary | ICD-10-CM

## 2018-05-19 MED ORDER — ALPRAZOLAM 0.5 MG PO TABS: ORAL_TABLET | ORAL | 3 refills | Status: DC

## 2018-06-17 ENCOUNTER — Encounter: Payer: Medicare Other | Admitting: Family

## 2018-06-17 ENCOUNTER — Encounter: Payer: Self-pay | Admitting: Family

## 2018-06-18 NOTE — Progress Notes (Signed)
Opened in error; Disregard.

## 2018-06-23 ENCOUNTER — Telehealth: Payer: Self-pay

## 2018-06-23 NOTE — Telephone Encounter (Signed)
Bryan Moore called this morning to say she had forgot Bryan Moore appointment last Tuesday at 1pm with np. Wanted to know if he had any new labs ordered. Informed her of his prior lab results and the need to schedule a follow up appointment. She declined scheduling a new appointment. Stated she would call back at the beginning of new year and that he was doing well. Requested that I ask provider if they would like to order new labs on patient.   Spoke with Bryan Moore about patient and wife's request for new labs. Bryan Moore advised me since the patient had already had labs collected on 03/05/18, labs did not need to be reordered. Advised to inform patient's wife that without a scheduled future appointment he will not be able to receive refills.   Called patient's wife Bryan Moore) and informed her without a scheduled appointment, he would not be able to get refills. Discussed with Bryan Moore that no new labs would be ordered on patient since labs were already collected in July 2019. She verbalized understanding and schedule a appointment for Bryan Moore on December 3 at 1 pm. She had no further questions or concerns.

## 2018-07-01 ENCOUNTER — Other Ambulatory Visit: Payer: Self-pay | Admitting: *Deleted

## 2018-07-01 ENCOUNTER — Other Ambulatory Visit: Payer: Self-pay | Admitting: Family

## 2018-07-01 MED ORDER — AMLODIPINE BESYLATE 5 MG PO TABS: 5.0000 mg | ORAL_TABLET | Freq: Every day | ORAL | 0 refills | Status: DC

## 2018-07-01 NOTE — Telephone Encounter (Signed)
[facility]

## 2018-07-07 ENCOUNTER — Other Ambulatory Visit: Payer: Self-pay | Admitting: *Deleted

## 2018-07-07 DIAGNOSIS — F02818 Dementia in other diseases classified elsewhere, unspecified severity, with other behavioral disturbance: Secondary | ICD-10-CM

## 2018-07-07 DIAGNOSIS — G301 Alzheimer's disease with late onset: Principal | ICD-10-CM

## 2018-07-07 DIAGNOSIS — F418 Other specified anxiety disorders: Secondary | ICD-10-CM

## 2018-07-07 DIAGNOSIS — F0281 Dementia in other diseases classified elsewhere with behavioral disturbance: Secondary | ICD-10-CM

## 2018-07-07 MED ORDER — CITALOPRAM HYDROBROMIDE 20 MG PO TABS: ORAL_TABLET | ORAL | 3 refills | Status: DC

## 2018-07-07 NOTE — Telephone Encounter (Signed)
[facility]

## 2018-07-08 ENCOUNTER — Encounter: Payer: Self-pay | Admitting: Family

## 2018-07-08 ENCOUNTER — Non-Acute Institutional Stay: Payer: Medicare Other | Admitting: Family

## 2018-07-08 VITALS — BP 112/60 | HR 63 | Temp 97.5°F | Resp 18 | Ht 67.0 in | Wt 146.0 lb

## 2018-07-08 DIAGNOSIS — J181 Lobar pneumonia, unspecified organism: Secondary | ICD-10-CM

## 2018-07-08 DIAGNOSIS — F02818 Dementia in other diseases classified elsewhere, unspecified severity, with other behavioral disturbance: Secondary | ICD-10-CM

## 2018-07-08 DIAGNOSIS — F0281 Dementia in other diseases classified elsewhere with behavioral disturbance: Secondary | ICD-10-CM | POA: Diagnosis not present

## 2018-07-08 DIAGNOSIS — G301 Alzheimer's disease with late onset: Secondary | ICD-10-CM | POA: Diagnosis not present

## 2018-07-08 DIAGNOSIS — R05 Cough: Secondary | ICD-10-CM | POA: Diagnosis not present

## 2018-07-08 DIAGNOSIS — K5901 Slow transit constipation: Secondary | ICD-10-CM

## 2018-07-08 DIAGNOSIS — J189 Pneumonia, unspecified organism: Secondary | ICD-10-CM

## 2018-07-08 DIAGNOSIS — F418 Other specified anxiety disorders: Secondary | ICD-10-CM

## 2018-07-08 DIAGNOSIS — I1310 Hypertensive heart and chronic kidney disease without heart failure, with stage 1 through stage 4 chronic kidney disease, or unspecified chronic kidney disease: Secondary | ICD-10-CM

## 2018-07-08 DIAGNOSIS — R059 Cough, unspecified: Secondary | ICD-10-CM

## 2018-07-08 MED ORDER — POLYETHYLENE GLYCOL 3350 17 GM/SCOOP PO POWD: 17.0000 g | Freq: Two times a day (BID) | ORAL | 1 refills | Status: DC | PRN

## 2018-07-08 MED ORDER — CITALOPRAM HYDROBROMIDE 20 MG PO TABS: ORAL_TABLET | ORAL | 3 refills | Status: DC

## 2018-07-08 NOTE — Patient Instructions (Signed)
1. Give Miralax 17 gm powder mix in 8 oz of fluid/jiuce twice daily as needed Notify provider's office if no bowel movement in 3 days.  2. Encourage fluid intake   3. Follow up in 6 months get lab work one week prior to visit

## 2018-07-08 NOTE — Progress Notes (Addendum)
Location:  [facility] (12) Provider:   FNP-C   , Nelda Bucks, NP  Patient Care Team: , Nelda Bucks, NP as PCP - General (Family Medicine) Ronald Lobo, MD (Gastroenterology) Rolm Bookbinder, MD as Consulting Physician (Dermatology) Nahser, [name], MD as Consulting Physician (Cardiology) Kathrynn Ducking, MD as Consulting Physician (Neurology) [name], MD as Attending Physician (Urology) Luberta Mutter, MD as Consulting Physician (Ophthalmology)  Extended Emergency Contact Information Primary Emergency Contact: [facility] Address: [address]          [place], [place] [address] [name] of [place] Phone: [phone] Mobile Phone: [phone] Relation: Spouse Secondary Emergency Contact: Melamed,Jeffrey          DES Merla Riches of [place] Phone: [phone] Mobile Phone: [phone] Relation: Son  Code Status: DNR  Goals of care: Advanced Directive information Advanced Directives 07/08/2018  Does Patient Have a Medical Advance Directive? Yes  Type of Paramedic of [place];Out of facility DNR (pink MOST or yellow form)  Does patient want to make changes to medical advance directive? No - Patient declined  Copy of [place] in Chart? Yes - validated most recent copy scanned in chart (See row information)  Pre-existing out of facility DNR order (yellow form or pink MOST form) Yellow form placed in chart (order not valid for inpatient use)     Chief Complaint  Patient presents with  . Medical Management of Chronic Issues    follow up from 11/27/17, patient c/o of cough for 3-4 days, also having constipation  . Health Maintenance    Patient would like PNA vaccine     HPI:  Pt is a 84 y.o. male seen today [place] for medical management of chronic diseases.He is seen in the clinic today escorted by wife and care  giver.Patient's HPI and ROS information provided by patient's wife and care giver due to patient's cognitive. Constipation:Patient's wife states patient has not had a bowel movement for the past 4-5 days wonders if there's a blockage somewhere.she states has given patient metamucil without any relief.she has noticed patient having pain at night.He tried to use the bathroom today without any success.Care giver states oatmeal usually works for patient which he has had today.Discussed options for a suppository but wife prefers to try stool softener and prune juice.His appetite has been good.No abdominal distention, nausea or vomiting. Wife also reports patient has had a cough for the past three days.No fever,chills,shortness of breath,chest pain or wheezing. His anxiety has improved since Celexa was increased last visit by MD.   Past Medical History:  Diagnosis Date  . Abnormality of gait 06/07/2016  . Anemia    from AVM's. Requiring transfusion  . Aortic stenosis    s/p TAVI July 2012  . Arthritis   . Balance problem    WEAK ON RIGHT SIDE ( SCOLISOSIS AND HX OF A BACK SURGERY) - BALANCE PROBLEM - "FALLS BACKWARD" - FREQUENT FALLS.  USES CANE WHEN AMBULATING  . Cancer (HCC)    BLADDER CANCER  . Chronic kidney disease   . Complication of anesthesia    VERY DROWSY FOR HOURS AFTER SURGERY - AND DISORIENTATION  . Dementia ([place])    more memory problems from stroke  . Fall   . GERD (gastroesophageal reflux disease)    RARE - AND NO MEDS  . GI bleed   . Glaucoma   .  Glaucoma   . Heart murmur    PT'S CARDIOLOGIST IS DR. Acie Fredrickson -LAST OFFICE VISIT 03/02/13 IN [facility]  . History of blood transfusion LAST 2-3 YRS AGO   HAD 4 OR 5   . History of cerebral infarction 08/19/2013  . Hypercholesteremia   . Hyperlipemia   . Hypertension    Not taking Lisinopril due to BP drops low.  . Memory loss   . Mitral regurgitation   . PONV (postoperative nausea and vomiting)    IN PAST, DID WELL WITH LAST SURGERY    . Shingles May 2013   right arm residual pain- last outbreak 1 1/2 yrs ago.  . Stroke ([place])    left internal capsule 1/15  . Transfusion history    last 2 yrs ago.   Past Surgical History:  Procedure Laterality Date  . AORTIC VALVE REPLACEMENT     02/2011([facility])  . APPENDECTOMY    . BACK SURGERY  2010 OR 2011   LOWER  . CARDIAC CATHETERIZATION  10/23/2010   ef 65%  . CARDIOVASCULAR STRESS TEST  01/01/2007   EF 64%, NO EVIDENCE OF ISCHEMIA  . CYSTOSCOPY W/ RETROGRADES Bilateral 09/17/2013   Procedure: CYSTOSCOPY WITH BILATERAL RETROGRADE;  Surgeon: [name], MD;  Location: [facility];  Service: Urology;  Laterality: Bilateral;  . CYSTOSCOPY W/ RETROGRADES Bilateral 11/15/2015   Procedure: CYSTO, BILATERAL RETROGRADE PYELOGRAM WITH BLADDER BIOPSY;  Surgeon: [name], MD;  Location: [facility];  Service: Urology;  Laterality: Bilateral;  . CYSTOSCOPY WITH STENT PLACEMENT Left 04/13/2013   Procedure:  LEFT URETERAL STENT PLACEMENT;  Surgeon: [name], MD;  Location: [facility];  Service: Urology;  Laterality: Left;  . EYE SURGERY     bilateral cataracts with lens implants  . TRANSURETHRAL RESECTION OF BLADDER TUMOR N/A 06/04/2013   Procedure: RESTAGING TRANSURETHRAL RESECTION OF BLADDER TUMOR (TURBT), URETERAL CATHERIZATION ;  Surgeon: [name], MD;  Location: [facility];  Service: Urology;  Laterality: N/A;  CYSTO RESTAGING TURBT     . TRANSURETHRAL RESECTION OF BLADDER TUMOR N/A 09/17/2013   Procedure: TRANSURETHRAL RESECTION OF BLADDER TUMOR WITH MITOMYCIN-C;  Surgeon: [name], MD;  Location: [facility];  Service: Urology;  Laterality: N/A;  . TRANSURETHRAL RESECTION OF BLADDER TUMOR WITH GYRUS (TURBT-GYRUS) N/A 04/13/2013   Procedure: TRANSURETHRAL RESECTION OF BLADDER TUMOR WITH GYRUS (TURBT-GYRUS);  Surgeon: [name], MD;  Location: [facility];  Service: Urology;  Laterality: N/A;  . US ECHOCARDIOGRAPHY  08/31/10   EF 55-60%    Allergies  Allergen Reactions  . Advil [Ibuprofen] Other  (See Comments)    Causes stomach bleeding, need transfusions as result  . Aspirin Other (See Comments)    Causes stomach bleeding, needs transfusion as result  . Penicillins Itching and Swelling    Has patient had a PCN reaction causing immediate rash, facial/tongue/throat swelling, SOB or lightheadedness with hypotension: Unsure Has patient had a PCN reaction causing severe rash involving mucus membranes or skin necrosis: Unsure  Has patient had a PCN reaction that required hospitalization Yes Has patient had a PCN reaction occurring within the last 10 years: No If all of the above answers are "NO", then may proceed with Cephalosporin use.  . Aricept [Donepezil]     Listed on face sheet  . Atorvastatin     Joint ache  . Famciclovir     Listed on face sheet  . Namenda [Memantine]     Stomach pain  . Other Other (See Comments)  No blood thinners due to GI bleed.  . Ativan [Lorazepam] Anxiety    He became very anxious and aggitated    Allergies as of 07/08/2018      Reactions   Advil [ibuprofen] Other (See Comments)   Causes stomach bleeding, need transfusions as result   Aspirin Other (See Comments)   Causes stomach bleeding, needs transfusion as result   Penicillins Itching, Swelling   Has patient had a PCN reaction causing immediate rash, facial/tongue/throat swelling, SOB or lightheadedness with hypotension: Unsure Has patient had a PCN reaction causing severe rash involving mucus membranes or skin necrosis: Unsure  Has patient had a PCN reaction that required hospitalization Yes Has patient had a PCN reaction occurring within the last 10 years: No If all of the above answers are "NO", then may proceed with Cephalosporin use.   Aricept [donepezil]    Listed on face sheet   Atorvastatin    Joint ache   Famciclovir    Listed on face sheet   Namenda [memantine]    Stomach pain   Other Other (See Comments)   No blood thinners due to GI bleed.   Ativan [lorazepam] Anxiety     He became very anxious and aggitated      Medication List        Accurate as of 07/08/18  8:41 PM. Always use your most recent med list.          acetaminophen 160 MG/5ML liquid Commonly known as:  TYLENOL 1 tablespoon 0.5 ml by mouth 2 times daily   ALPRAZolam 0.5 MG tablet Commonly known as:  XANAX SEE NOTES   amLODipine 5 MG tablet Commonly known as:  NORVASC TAKE 1 TABLET(5 MG) BY MOUTH DAILY   cholecalciferol 1000 units tablet Commonly known as:  VITAMIN D Take 1,000 Units by mouth daily.   citalopram 20 MG tablet Commonly known as:  CELEXA Take one and a half tablet = 30 mg daily to help with his anxiety.   iron polysaccharides 150 MG capsule Commonly known as:  NIFEREX Take 150 mg by mouth every morning.   latanoprost 0.005 % ophthalmic solution Commonly known as:  XALATAN Place 1 drop into both eyes at bedtime.   loperamide 2 MG tablet Commonly known as:  IMODIUM A-D Take 2 mg by mouth as needed.   polyethylene glycol powder powder Commonly known as:  GLYCOLAX/MIRALAX Take 17 g by mouth 2 (two) times daily as needed.       Review of Systems  Unable to perform ROS: Dementia (additional information provided by patient's wife and care giver present during visit )  Constitutional: Negative for appetite change, chills, fatigue, fever and unexpected weight change.  HENT: Negative for congestion, rhinorrhea, sinus pressure, sinus pain, sneezing, sore throat and trouble swallowing.   Eyes: Negative for discharge, redness and itching.  Respiratory: Negative for cough, chest tightness, shortness of breath and wheezing.   Cardiovascular: Negative for chest pain, palpitations and leg swelling.  Gastrointestinal: Negative for abdominal distention, abdominal pain, constipation, diarrhea, nausea and vomiting.  Endocrine: Negative for cold intolerance, heat intolerance, polydipsia, polyphagia and polyuria.  Genitourinary:       Incontinent   Musculoskeletal:  Positive for gait problem.       Requires assistance with transfers  Skin: Negative for color change, pallor, rash and wound.  Neurological: Negative for dizziness, light-headedness and headaches.  Hematological: Does not bruise/bleed easily.  Psychiatric/Behavioral: Positive for confusion. Negative for agitation and sleep disturbance. The patient is  not nervous/anxious.     Immunization History  Administered Date(s) Administered  . Influenza Nasal 05/13/2016  . Influenza, High Dose Seasonal PF 05/15/2017  . Influenza,inj,Quad PF,6+ Mos 05/08/2018  . Pneumococcal-Unspecified 03/07/2011  . Tdap 02/18/2015, 06/01/2016   Pertinent  Health Maintenance Due  Topic Date Due  . PNA vac Low Risk Adult (2 of 2 - PCV13) 03/06/2012  . INFLUENZA VACCINE  Completed   Fall Risk  07/08/2018 01/15/2018 07/04/2017  Falls in the past year? 0 No No  Number falls in past yr: 0 - -  Injury with Fall? 0 - -    Vitals:   07/08/18 1300  BP: 112/60  Pulse: 63  Resp: 18  Temp: (!) 97.5 F (36.4 C)  TempSrc: Oral  SpO2: 96%  Weight: 146 lb (66.2 kg)  Height: _0  (1.702 m)   Body mass index is 22.87 kg/m. Physical Exam  Constitutional: He appears well-developed and well-nourished. No distress.  Elderly   HENT:  Head: Normocephalic.  Right Ear: External ear normal.  Left Ear: External ear normal.  Mouth/Throat: Oropharynx is clear and moist. No oropharyngeal exudate.  Eyes: Pupils are equal, round, and reactive to light. Conjunctivae and EOM are normal. Right eye exhibits no discharge. Left eye exhibits no discharge. No scleral icterus.  corrective lens in place   Neck: Normal range of motion. No JVD present. No thyromegaly present.  Cardiovascular: Normal rate, regular rhythm, normal heart sounds and intact distal pulses. Exam reveals no gallop and no friction rub.  No murmur heard. Pulmonary/Chest: Effort normal and breath sounds normal. No respiratory distress. He has no wheezes. He has no  rales.  Abdominal: Soft. Bowel sounds are normal. He exhibits no distension and no mass. There is no tenderness. There is no rebound and no guarding.  Genitourinary:  Genitourinary Comments: Incontinent   Musculoskeletal: He exhibits no edema or tenderness.  Moves x 4 extremities.Reqiures assistance with transfers   Lymphadenopathy:    He has no cervical adenopathy.  Neurological:  Alert and oriented to person but confused.co-operative and answered some questions appropriately during visit.   Skin: Skin is warm and dry. Capillary refill takes 2 to 3 seconds. No rash noted. No erythema. No pallor.  Psychiatric: He has a normal mood and affect. His speech is normal and behavior is normal. Judgment and thought content normal. He exhibits abnormal recent memory.  Vitals reviewed.   Labs reviewed: Recent Labs    11/11/17 0810 03/05/18 0000  NA 137 138  K 4.7 4.5  CL 104 104  CO2 27 25  GLUCOSE 78 82  BUN 26* 30*  CREATININE 1.49* 1.47*  CALCIUM 9.0 9.1   Recent Labs    11/11/17 0810 03/05/18 0000  AST 12 14  ALT 6* 9  BILITOT 0.5 0.5  PROT 7.2 7.7   Recent Labs    11/11/17 0810 03/05/18 0000  WBC 4.6 4.3  HGB 11.1* 12.0*  HCT 32.0* 35.3*  MCV 92.5 93.4  PLT 178 191   Lab Results  Component Value Date   TSH 2.55 02/13/2017   Lab Results  Component Value Date   HGBA1C 5.2 08/19/2013   Lab Results  Component Value Date   CHOL 177 03/05/2018   HDL 45 03/05/2018   LDLCALC 114 (H) 03/05/2018   TRIG 85 03/05/2018   CHOLHDL 3.9 03/05/2018    Significant Diagnostic Results in last 30 days:  No results found.  Assessment/Plan 1. Hypertensive heart and renal disease with renal failure,  stage 1 through stage 4 or unspecified chronic kidney disease, without heart failure B/p stable.continue on amlodipine 5 mg tablet daily. - CBC with Differential/Platelets; Future - CMP with eGFR([facility]); Future  2. Slow transit constipation No BM for the past 4-5 days per wife  recommended suppository to be given today but wife prefers stool softeners.Add miralax 17 Gm packet mix in 8 oz of fluid and drink  by mouth twice daily as needed.May also give warm prune juice daily Hold for loose stool.Notify provider if no bowel movement results.    3. Late onset Alzheimer's disease with behavioral disturbance ([place]) No new behavioral issues reported.continue with supportive care.  - citalopram (CELEXA) 20 MG tablet; Take one and a half tablet = 30 mg daily to help with his anxiety.  Dispense: 135 tablet; Refill: 3  4. Depression with anxiety Mood stable.continue current medication and monitor for mood changes. - citalopram (CELEXA) 20 MG tablet; Take one and a half tablet = 30 mg daily to help with his anxiety.  Dispense: 135 tablet; Refill: 3 - TSH; Future  5. Cough  Afebrile.Bilateral lungs clear to auscultation.continue to monitor Temp curve.OTC robitussin every 8 hrs as needed.   Family/ staff Communication: Reviewed plan of care with patient and care giver.   Labs/tests ordered: CBC/diff,CMP,TSH level    Addendum: 07/14/2018 Cough worsening per patient's wife.will order chest X-ray 2 views to rule out pneumonia.   X-ray results : Left lower lung consolidation consistent with pneumonia. Will start on Doxycycline 100 mg tablet twice daily x 10 days along with florastor 250 mg capsule one by mouth twice daily x 10 days for antibiotic associated diarrhea prevention. Medication e-prescribed to patient's pharmacy and POA notified by [place].    Sandrea Hughs, NP

## 2018-07-14 ENCOUNTER — Ambulatory Visit
Admission: RE | Admit: 2018-07-14 | Discharge: 2018-07-14 | Disposition: A | Discharge status: Home / Self Care | Payer: Medicare Other | Source: Ambulatory Visit | Attending: Family | Admitting: Family

## 2018-07-14 DIAGNOSIS — R918 Other nonspecific abnormal finding of lung field: Secondary | ICD-10-CM | POA: Diagnosis not present

## 2018-07-14 DIAGNOSIS — R059 Cough, unspecified: Secondary | ICD-10-CM

## 2018-07-14 DIAGNOSIS — R05 Cough: Secondary | ICD-10-CM

## 2018-07-14 MED ORDER — DOXYCYCLINE HYCLATE 100 MG PO TABS: 100.0000 mg | ORAL_TABLET | Freq: Two times a day (BID) | ORAL | 0 refills | Status: AC

## 2018-07-14 MED ORDER — SACCHAROMYCES BOULARDII 250 MG PO CAPS: 250.0000 mg | ORAL_CAPSULE | Freq: Two times a day (BID) | ORAL | 0 refills | Status: AC

## 2018-07-14 NOTE — Addendum Note (Signed)
Addended byMarlowe Sax C on: 07/14/2018 03:35 PM   Modules accepted: Orders

## 2018-07-14 NOTE — Addendum Note (Signed)
Addended byMarlowe Sax C on: 07/14/2018 09:32 AM   Modules accepted: Orders

## 2018-08-22 DIAGNOSIS — M79671 Pain in right foot: Secondary | ICD-10-CM | POA: Diagnosis not present

## 2018-08-22 DIAGNOSIS — B351 Tinea unguium: Secondary | ICD-10-CM | POA: Diagnosis not present

## 2018-08-22 DIAGNOSIS — M79672 Pain in left foot: Secondary | ICD-10-CM | POA: Diagnosis not present

## 2018-09-15 ENCOUNTER — Other Ambulatory Visit: Payer: Self-pay | Admitting: Family

## 2018-09-15 ENCOUNTER — Other Ambulatory Visit: Payer: Self-pay | Admitting: *Deleted

## 2018-09-15 DIAGNOSIS — F1320 Sedative, hypnotic or anxiolytic dependence, uncomplicated: Secondary | ICD-10-CM

## 2018-09-15 DIAGNOSIS — F418 Other specified anxiety disorders: Secondary | ICD-10-CM

## 2018-09-15 MED ORDER — ALPRAZOLAM 0.5 MG PO TABS: 0.5000 mg | ORAL_TABLET | ORAL | 0 refills | Status: DC

## 2018-10-10 ENCOUNTER — Other Ambulatory Visit: Payer: Self-pay | Admitting: Nurse Practitioner

## 2018-10-10 DIAGNOSIS — F1320 Sedative, hypnotic or anxiolytic dependence, uncomplicated: Secondary | ICD-10-CM

## 2018-10-10 DIAGNOSIS — F418 Other specified anxiety disorders: Secondary | ICD-10-CM

## 2018-11-01 ENCOUNTER — Other Ambulatory Visit: Payer: Self-pay | Admitting: Family

## 2018-11-15 ENCOUNTER — Other Ambulatory Visit: Payer: Self-pay | Admitting: Nurse Practitioner

## 2018-11-15 DIAGNOSIS — F418 Other specified anxiety disorders: Secondary | ICD-10-CM

## 2018-11-15 DIAGNOSIS — F1320 Sedative, hypnotic or anxiolytic dependence, uncomplicated: Secondary | ICD-10-CM

## 2018-11-17 ENCOUNTER — Other Ambulatory Visit: Payer: Self-pay | Admitting: *Deleted

## 2018-11-17 DIAGNOSIS — F418 Other specified anxiety disorders: Secondary | ICD-10-CM

## 2018-11-17 DIAGNOSIS — F1320 Sedative, hypnotic or anxiolytic dependence, uncomplicated: Secondary | ICD-10-CM

## 2018-11-17 MED ORDER — ALPRAZOLAM 0.5 MG PO TABS: ORAL_TABLET | ORAL | 0 refills | Status: DC

## 2018-11-18 ENCOUNTER — Other Ambulatory Visit: Payer: Self-pay | Admitting: *Deleted

## 2018-11-18 DIAGNOSIS — G301 Alzheimer's disease with late onset: Principal | ICD-10-CM

## 2018-11-18 DIAGNOSIS — F0281 Dementia in other diseases classified elsewhere with behavioral disturbance: Secondary | ICD-10-CM

## 2018-11-18 DIAGNOSIS — F418 Other specified anxiety disorders: Secondary | ICD-10-CM

## 2018-11-18 MED ORDER — CITALOPRAM HYDROBROMIDE 20 MG PO TABS: ORAL_TABLET | ORAL | 1 refills | Status: DC

## 2018-11-18 MED ORDER — AMLODIPINE BESYLATE 5 MG PO TABS: ORAL_TABLET | ORAL | 1 refills | Status: DC

## 2018-11-18 NOTE — Telephone Encounter (Signed)
Bryan Moore, Wife called and requested Rx to be sent to [facility].

## 2018-11-20 ENCOUNTER — Encounter: Payer: Self-pay | Admitting: Nurse Practitioner

## 2018-11-20 ENCOUNTER — Other Ambulatory Visit: Payer: Self-pay

## 2018-11-20 ENCOUNTER — Ambulatory Visit (INDEPENDENT_AMBULATORY_CARE_PROVIDER_SITE_OTHER): Payer: PPO | Admitting: Nurse Practitioner

## 2018-11-20 DIAGNOSIS — J302 Other seasonal allergic rhinitis: Secondary | ICD-10-CM

## 2018-11-20 NOTE — Progress Notes (Signed)
This service is provided via telemedicine  No vital signs collected/recorded due to the encounter was a telemedicine visit.   Location of patient (ex: home, work):  [place] independent apartment  Patient consents to a telephone visit:  yes  Location of the provider (ex: office, home):  Office   Name of any referring provider: Marlana Latus, NP  Names of all persons participating in the telemedicine service and their role in the encounter:  Ruthell Rummage CMA, Sherrie Mustache NP, Bryan Moore and wife Bryan Moore  Time spent on call:  Ruthell Rummage CMA spent  10  Minutes with patient on phone.   Virtual Visit via Telephone Note  I connected with Bryan Moore on 11/20/18 at  9:00 AM EDT by telephone and verified that I am speaking with the correct person using two identifiers.   I discussed the limitations, risks, security and privacy concerns of performing an evaluation and management service by telephone and the availability of in person appointments. I also discussed with the patient that there may be a patient responsible charge related to this service. The patient expressed understanding and agreed to proceed.      Careteam: Patient Care Team: Mast, Man X, NP as PCP - General (Internal Medicine) Ronald Lobo, MD (Gastroenterology) Rolm Bookbinder, MD as Consulting Physician (Dermatology) Nahser, [name], MD as Consulting Physician (Cardiology) Kathrynn Ducking, MD as Consulting Physician (Neurology) [name], MD as Attending Physician (Urology) Luberta Mutter, MD as Consulting Physician (Ophthalmology)  Advanced Directive information Does Patient Have a Medical Advance Directive?: Yes, Type of Advance Directive: Out of facility DNR (pink MOST or yellow form), Pre-existing out of facility DNR order (yellow form or pink MOST form): Yellow form placed in chart (order not valid for inpatient use), Does patient want to make changes to medical advance directive?: No -  Patient declined  Allergies  Allergen Reactions  . Advil [Ibuprofen] Other (See Comments)    Causes stomach bleeding, need transfusions as result  . Aspirin Other (See Comments)    Causes stomach bleeding, needs transfusion as result  . Penicillins Itching and Swelling    Has patient had a PCN reaction causing immediate rash, facial/tongue/throat swelling, SOB or lightheadedness with hypotension: Unsure Has patient had a PCN reaction causing severe rash involving mucus membranes or skin necrosis: Unsure  Has patient had a PCN reaction that required hospitalization Yes Has patient had a PCN reaction occurring within the last 10 years: No If all of the above answers are "NO", then may proceed with Cephalosporin use.  . Aricept [Donepezil]     Listed on face sheet  . Atorvastatin     Joint ache  . Famciclovir     Listed on face sheet  . Namenda [Memantine]     Stomach pain  . Other Other (See Comments)    No blood thinners due to GI bleed.  . Ativan [Lorazepam] Anxiety    He became very anxious and aggitated    Chief Complaint  Patient presents with  . Acute Visit    Patient c/o cough for week, no fever, wife is concerned wants to make sure it's not serious   . Medicare Wellness    wife has been giving patient cetirizine stated has been helping with cough but makes patient too drowsey      HPI: Patient is a 85 y.o. male who has dementia and is non-verbal. Wife is providing hx.  Reports he has had a cough for ~1 week.  Was giving him medication for allergies but made him very sleepy so she stopped it and sleepiness improved. She was giving him cetirizine 10 mg.  Last night and this morning was better. He has had hx of pneumonia due to problems with swallowing and he chronically coughs at meals.  No fever, no shortness of breath, no wheezing, no chest congestion. Has nasal congestion with runny nose. Otherwise feels well.  Dry cough, nonproductive.   Review of Systems:   Review of Systems  Constitutional: Negative for chills, fever and weight loss.  HENT: Positive for congestion. Negative for hearing loss.        Clear runny nose   Respiratory: Positive for cough. Negative for sputum production, shortness of breath and wheezing.   Cardiovascular: Negative for chest pain and leg swelling.  Psychiatric/Behavioral: Positive for memory loss.    Past Medical History:  Diagnosis Date  . Abnormality of gait 06/07/2016  . Anemia    from AVM's. Requiring transfusion  . Aortic stenosis    s/p TAVI July 2012  . Arthritis   . Balance problem    WEAK ON RIGHT SIDE ( SCOLISOSIS AND HX OF A BACK SURGERY) - BALANCE PROBLEM - "FALLS BACKWARD" - FREQUENT FALLS.  USES CANE WHEN AMBULATING  . Cancer (HCC)    BLADDER CANCER  . Chronic kidney disease   . Complication of anesthesia    VERY DROWSY FOR HOURS AFTER SURGERY - AND DISORIENTATION  . Dementia ([place])    more memory problems from stroke  . Fall   . GERD (gastroesophageal reflux disease)    RARE - AND NO MEDS  . GI bleed   . Glaucoma   . Glaucoma   . Heart murmur    PT'S CARDIOLOGIST IS DR. Acie Fredrickson -LAST OFFICE VISIT 03/02/13 IN [facility]  . History of blood transfusion LAST 2-3 YRS AGO   HAD 4 OR 5   . History of cerebral infarction 08/19/2013  . Hypercholesteremia   . Hyperlipemia   . Hypertension    Not taking Lisinopril due to BP drops low.  . Memory loss   . Mitral regurgitation   . PONV (postoperative nausea and vomiting)    IN PAST, DID WELL WITH LAST SURGERY  . Shingles May 2013   right arm residual pain- last outbreak 1 1/2 yrs ago.  . Stroke ([place])    left internal capsule 1/15  . Transfusion history    last 2 yrs ago.   Past Surgical History:  Procedure Laterality Date  . AORTIC VALVE REPLACEMENT     02/2011([facility])  . APPENDECTOMY    . BACK SURGERY  2010 OR 2011   LOWER  . CARDIAC CATHETERIZATION  10/23/2010   ef 65%  . CARDIOVASCULAR STRESS TEST  01/01/2007   EF 64%, NO EVIDENCE OF  ISCHEMIA  . CYSTOSCOPY W/ RETROGRADES Bilateral 09/17/2013   Procedure: CYSTOSCOPY WITH BILATERAL RETROGRADE;  Surgeon: [name], MD;  Location: [facility];  Service: Urology;  Laterality: Bilateral;  . CYSTOSCOPY W/ RETROGRADES Bilateral 11/15/2015   Procedure: CYSTO, BILATERAL RETROGRADE PYELOGRAM WITH BLADDER BIOPSY;  Surgeon: [name], MD;  [facility];  Service: Urology;  Laterality: Bilateral;  . CYSTOSCOPY WITH STENT PLACEMENT Left 04/13/2013   Procedure:  LEFT URETERAL STENT PLACEMENT;  Surgeon: [name], MD;  [facility];  Service: Urology;  Laterality: Left;  . EYE SURGERY     bilateral cataracts with lens implants  . TRANSURETHRAL RESECTION OF BLADDER TUMOR N/A 06/04/2013  Procedure: RESTAGING TRANSURETHRAL RESECTION OF BLADDER TUMOR (TURBT), URETERAL CATHERIZATION ;  Surgeon: [name], MD;  [facility];  Service: Urology;  Laterality: N/A;  CYSTO RESTAGING TURBT     . TRANSURETHRAL RESECTION OF BLADDER TUMOR N/A 09/17/2013   Procedure: TRANSURETHRAL RESECTION OF BLADDER TUMOR WITH MITOMYCIN-C;  Surgeon: [name], MD;  [facility];  Service: Urology;  Laterality: N/A;  . TRANSURETHRAL RESECTION OF BLADDER TUMOR WITH GYRUS (TURBT-GYRUS) N/A 04/13/2013   Procedure: TRANSURETHRAL RESECTION OF BLADDER TUMOR WITH GYRUS (TURBT-GYRUS);  Surgeon: [name], MD;  [facility];  Service: Urology;  Laterality: N/A;  . US ECHOCARDIOGRAPHY  08/31/10   EF 55-60%   Social History:   reports that he quit smoking about 37 years ago. His smoking use included pipe. He has never used smokeless tobacco. He reports previous alcohol use of about 1.0 standard drinks of alcohol per week. He reports that he does not use drugs.  Family History  Problem Relation Age of Onset  . COPD Father   . Stroke Father   . Parkinson's disease Brother 71  . Heart failure Mother   . Bowel Disease Sister 21    Medications: Patient's Medications  New Prescriptions   No medications on  file  Previous Medications   ACETAMINOPHEN (TYLENOL) 160 MG/5ML LIQUID    1 tablespoon 0.5 ml by mouth 2 times daily   ALPRAZOLAM (XANAX) 0.5 MG TABLET    TAKE 1/2 TABLET BY MOUTH EVERY MORNING AND NOON THEN 1/2 TABLET AT BEDTIME OK TO TAKE ADDITIONAL 1/2 TABLET AT NOON FOR SEVERE ANXIETY/AGITATION.   AMLODIPINE (NORVASC) 5 MG TABLET    TAKE 1 TABLET(5 MG) BY MOUTH DAILY   CHOLECALCIFEROL (VITAMIN D) 1000 UNITS TABLET    Take 1,000 Units by mouth daily.   CITALOPRAM (CELEXA) 20 MG TABLET    Take one and a half tablet = 30 mg daily to help with his anxiety.   IRON POLYSACCHARIDES (NIFEREX) 150 MG CAPSULE    Take 150 mg by mouth every morning.    LATANOPROST (XALATAN) 0.005 % OPHTHALMIC SOLUTION    Place 1 drop into both eyes at bedtime.   LOPERAMIDE (IMODIUM A-D) 2 MG TABLET    Take 2 mg by mouth as needed.    POLYETHYLENE GLYCOL POWDER (GLYCOLAX/MIRALAX) POWDER    Take 17 g by mouth 2 (two) times daily as needed.  Modified Medications   No medications on file  Discontinued Medications   No medications on file     Physical Exam:   Labs reviewed: Basic Metabolic Panel: Recent Labs    03/05/18 0000  NA 138  K 4.5  CL 104  CO2 25  GLUCOSE 82  BUN 30*  CREATININE 1.47*  CALCIUM 9.1   Liver Function Tests: Recent Labs    03/05/18 0000  AST 14  ALT 9  BILITOT 0.5  PROT 7.7   No results for input(s): LIPASE, AMYLASE in the last 8760 hours. No results for input(s): AMMONIA in the last 8760 hours. CBC: Recent Labs    03/05/18 0000  WBC 4.3  HGB 12.0*  HCT 35.3*  MCV 93.4  PLT 191   Lipid Panel: Recent Labs    03/05/18 0000  CHOL 177  HDL 45  LDLCALC 114*  TRIG 85  CHOLHDL 3.9   TSH: No results for input(s): TSH in the last 8760 hours. A1C: Lab Results  Component Value Date   HGBA1C 5.2 08/19/2013     Assessment/Plan 1.  Seasonal allergies Cough likely related to allergies however with hx of aspiration and coughs chronically with eating. To monitor  temperature to make sure he does not develop fever.  Currently without he is not having chest congestion or productive sputum.  -zyrtec causing drowsiness, can change to Claritin however may cause similar response and to stop if so.  To notify for worsening of symptoms    Carlos American. Harle Battiest  [facility] [phone]    Follow Up Instructions:    I discussed the assessment and treatment plan with the patient. The patient was provided an opportunity to ask questions and all were answered. The patient agreed with the plan and demonstrated an understanding of the instructions.   The patient was advised to call back or seek an in-person evaluation if the symptoms worsen or if the condition fails to improve as anticipated.  I provided 12 minutes of non-face-to-face time during this encounter.  Sherrie Mustache, NP

## 2018-11-20 NOTE — Patient Instructions (Signed)
To start loratadine 10 mg by mouth daily (at bedtime) for allergies and to help cough.   Monitor for worsening cough with congestion or sputum, fever or shortness of breath.

## 2018-11-26 ENCOUNTER — Other Ambulatory Visit: Payer: Self-pay

## 2018-11-26 ENCOUNTER — Encounter: Payer: Self-pay | Admitting: Internal Medicine

## 2018-11-26 ENCOUNTER — Non-Acute Institutional Stay: Payer: PPO | Admitting: Internal Medicine

## 2018-11-26 VITALS — BP 118/62 | HR 74 | Temp 98.2°F | Ht 67.0 in | Wt 137.8 lb

## 2018-11-26 DIAGNOSIS — R05 Cough: Secondary | ICD-10-CM

## 2018-11-26 DIAGNOSIS — N183 Chronic kidney disease, stage 3 unspecified: Secondary | ICD-10-CM

## 2018-11-26 DIAGNOSIS — I1310 Hypertensive heart and chronic kidney disease without heart failure, with stage 1 through stage 4 chronic kidney disease, or unspecified chronic kidney disease: Secondary | ICD-10-CM

## 2018-11-26 DIAGNOSIS — R5383 Other fatigue: Secondary | ICD-10-CM

## 2018-11-26 DIAGNOSIS — R059 Cough, unspecified: Secondary | ICD-10-CM | POA: Insufficient documentation

## 2018-11-26 NOTE — Progress Notes (Addendum)
Location:  [place] of Service:  Clinic (12)  Provider:   Code Status:   Goals of Care:  Advanced Directives 11/26/2018  Does Patient Have a Medical Advance Directive? Yes  Type of Paramedic of [place];Out of facility DNR (pink MOST or yellow form)  Does patient want to make changes to medical advance directive? No - Patient declined  Copy of [place] in Chart? Yes - validated most recent copy scanned in chart (See row information)  Pre-existing out of facility DNR order (yellow form or pink MOST form) Yellow form placed in chart (order not valid for inpatient use)     Chief Complaint  Patient presents with  . Acute Visit    dry cough symptoms  for 2 weeks,fatigue     HPI: Patient is a 85 y.o. male seen today for Acute visit for Productive cough and increased lethargy Patient came with his wife and the caregiver.  He has advanced dementia and is unable to give me any history. He also has a history of  dementia with behavior disturbances, GERD, GI bleed due to blood thinners, history of TAVR, hypertension, hyperlipidemia, gait disorder, iron deficiency anemia His wife says patient has been having productive cough for over 2 weeks.  With some worsening recently.  He has been sleeping more and has been  weak and tired.  No history of fever or shortness of breath Patient also has h/o coughing and choking on his food. .Patient lives with his wife and has caregivers some hours of the day help with his ADLs.  He still is able to feed by himself.   Past Medical History:  Diagnosis Date  . Abnormality of gait 06/07/2016  . Anemia    from AVM's. Requiring transfusion  . Aortic stenosis    s/p TAVI July 2012  . Arthritis   . Balance problem    WEAK ON RIGHT SIDE ( SCOLISOSIS AND HX OF A BACK SURGERY) - BALANCE PROBLEM - "FALLS BACKWARD" - FREQUENT FALLS.  USES CANE WHEN AMBULATING  . Cancer (HCC)    BLADDER CANCER  .  Chronic kidney disease   . Complication of anesthesia    VERY DROWSY FOR HOURS AFTER SURGERY - AND DISORIENTATION  . Dementia ([place])    more memory problems from stroke  . Fall   . GERD (gastroesophageal reflux disease)    RARE - AND NO MEDS  . GI bleed   . Glaucoma   . Glaucoma   . Heart murmur    PT'S CARDIOLOGIST IS DR. Acie Fredrickson -LAST OFFICE VISIT 03/02/13 IN [facility]  . History of blood transfusion LAST 2-3 YRS AGO   HAD 4 OR 5   . History of cerebral infarction 08/19/2013  . Hypercholesteremia   . Hyperlipemia   . Hypertension    Not taking Lisinopril due to BP drops low.  . Memory loss   . Mitral regurgitation   . PONV (postoperative nausea and vomiting)    IN PAST, DID WELL WITH LAST SURGERY  . Shingles May 2013   right arm residual pain- last outbreak 1 1/2 yrs ago.  . Stroke ([place])    left internal capsule 1/15  . Transfusion history    last 2 yrs ago.    Past Surgical History:  Procedure Laterality Date  . AORTIC VALVE REPLACEMENT     02/2011([facility])  . APPENDECTOMY    . BACK SURGERY  2010 OR 2011  LOWER  . CARDIAC CATHETERIZATION  10/23/2010   ef 65%  . CARDIOVASCULAR STRESS TEST  01/01/2007   EF 64%, NO EVIDENCE OF ISCHEMIA  . CYSTOSCOPY W/ RETROGRADES Bilateral 09/17/2013   Procedure: CYSTOSCOPY WITH BILATERAL RETROGRADE;  Surgeon: [name], MD;  [facility];  Service: Urology;  Laterality: Bilateral;  . CYSTOSCOPY W/ RETROGRADES Bilateral 11/15/2015   Procedure: CYSTO, BILATERAL RETROGRADE PYELOGRAM WITH BLADDER BIOPSY;  Surgeon: [name], MD;  [facility];  Service: Urology;  Laterality: Bilateral;  . CYSTOSCOPY WITH STENT PLACEMENT Left 04/13/2013   Procedure:  LEFT URETERAL STENT PLACEMENT;  Surgeon: [name], MD;  [facility];  Service: Urology;  Laterality: Left;  . EYE SURGERY     bilateral cataracts with lens implants  . TRANSURETHRAL RESECTION OF BLADDER TUMOR N/A 06/04/2013   Procedure: RESTAGING TRANSURETHRAL RESECTION OF BLADDER  TUMOR (TURBT), URETERAL CATHERIZATION ;  Surgeon: [name], MD;  [facility];  Service: Urology;  Laterality: N/A;  CYSTO RESTAGING TURBT     . TRANSURETHRAL RESECTION OF BLADDER TUMOR N/A 09/17/2013   Procedure: TRANSURETHRAL RESECTION OF BLADDER TUMOR WITH MITOMYCIN-C;  Surgeon: [name], MD;  [facility];  Service: Urology;  Laterality: N/A;  . TRANSURETHRAL RESECTION OF BLADDER TUMOR WITH GYRUS (TURBT-GYRUS) N/A 04/13/2013   Procedure: TRANSURETHRAL RESECTION OF BLADDER TUMOR WITH GYRUS (TURBT-GYRUS);  Surgeon: [name], MD;  [facility];  Service: Urology;  Laterality: N/A;  . US ECHOCARDIOGRAPHY  08/31/10   EF 55-60%    Allergies  Allergen Reactions  . Advil [Ibuprofen] Other (See Comments)    Causes stomach bleeding, need transfusions as result  . Aspirin Other (See Comments)    Causes stomach bleeding, needs transfusion as result  . Penicillins Itching and Swelling    Has patient had a PCN reaction causing immediate rash, facial/tongue/throat swelling, SOB or lightheadedness with hypotension: Unsure Has patient had a PCN reaction causing severe rash involving mucus membranes or skin necrosis: Unsure  Has patient had a PCN reaction that required hospitalization Yes Has patient had a PCN reaction occurring within the last 10 years: No If all of the above answers are "NO", then may proceed with Cephalosporin use.  . Aricept [Donepezil]     Listed on face sheet  . Atorvastatin     Joint ache  . Famciclovir     Listed on face sheet  . Namenda [Memantine]     Stomach pain  . Other Other (See Comments)    No blood thinners due to GI bleed.  . Ativan [Lorazepam] Anxiety    He became very anxious and aggitated    Outpatient Encounter Medications as of 11/26/2018  Medication Sig  . acetaminophen (TYLENOL) 160 MG/5ML liquid 1 tablespoon 0.5 ml by mouth 2 times daily  . ALPRAZolam (XANAX) 0.5 MG tablet TAKE 1/2 TABLET BY MOUTH EVERY MORNING AND NOON THEN 1/2  TABLET AT BEDTIME OK TO TAKE ADDITIONAL 1/2 TABLET AT NOON FOR SEVERE ANXIETY/AGITATION.  Marland Kitchen amLODipine (NORVASC) 5 MG tablet TAKE 1 TABLET(5 MG) BY MOUTH DAILY  . cholecalciferol (VITAMIN D) 1000 units tablet Take 1,000 Units by mouth daily.  . citalopram (CELEXA) 20 MG tablet Take one and a half tablet = 30 mg daily to help with his anxiety.  . iron polysaccharides (NIFEREX) 150 MG capsule Take 150 mg by mouth every morning.   . latanoprost (XALATAN) 0.005 % ophthalmic solution Place 1 drop into both eyes at bedtime.  Marland Kitchen loperamide (IMODIUM A-D)  2 MG tablet Take 2 mg by mouth as needed.   . polyethylene glycol powder (GLYCOLAX/MIRALAX) powder Take 17 g by mouth 2 (two) times daily as needed.   No facility-administered encounter medications on file as of 11/26/2018.     Review of Systems:  Review of Systems  Unable to perform ROS: Dementia    Health Maintenance  Topic Date Due  . PNA vac Low Risk Adult (2 of 2 - PCV13) 03/06/2012  . INFLUENZA VACCINE  03/07/2019  . TETANUS/TDAP  06/01/2026    Physical Exam: Vitals:   11/26/18 1627  BP: 118/62  Pulse: 74  Temp: 98.2 F (36.8 C)  TempSrc: Oral  SpO2: 98%  Weight: 137 lb 12.8 oz (62.5 kg)  Height: 5\' 7"  (1.702 m)   Body mass index is 21.58 kg/m. Physical Exam Vitals signs reviewed.  Constitutional:      Comments: Was sleepy   HENT:     Head: Normocephalic.     Nose: Nose normal.     Mouth/Throat:     Mouth: Mucous membranes are moist.     Pharynx: Oropharynx is clear.  Eyes:     Pupils: Pupils are equal, round, and reactive to light.  Neck:     Musculoskeletal: Neck supple.  Cardiovascular:     Rate and Rhythm: Normal rate and regular rhythm.     Pulses: Normal pulses.     Heart sounds: Normal heart sounds.  Pulmonary:     Effort: Pulmonary effort is normal.     Comments: Has rales bilateral Abdominal:     General: Abdomen is flat. Bowel sounds are normal. There is no distension.     Palpations: Abdomen is  soft.     Tenderness: There is no abdominal tenderness. There is no guarding.  Musculoskeletal:        General: No swelling.  Skin:    General: Skin is warm and dry.  Neurological:     Comments: Per caregiver he can walk short distances with walker But mostly wheelchair dependent Would not follow any command s for me. Usually just nods his head   Psychiatric:        Mood and Affect: Mood normal.        Behavior: Behavior normal.        Thought Content: Thought content normal.     Labs reviewed: Basic Metabolic Panel: Recent Labs    03/05/18 0000  NA 138  K 4.5  CL 104  CO2 25  GLUCOSE 82  BUN 30*  CREATININE 1.47*  CALCIUM 9.1   Liver Function Tests: Recent Labs    03/05/18 0000  AST 14  ALT 9  BILITOT 0.5  PROT 7.7   No results for input(s): LIPASE, AMYLASE in the last 8760 hours. No results for input(s): AMMONIA in the last 8760 hours. CBC: Recent Labs    03/05/18 0000  WBC 4.3  HGB 12.0*  HCT 35.3*  MCV 93.4  PLT 191   Lipid Panel: Recent Labs    03/05/18 0000  CHOL 177  HDL 45  LDLCALC 114*  TRIG 85  CHOLHDL 3.9   Lab Results  Component Value Date   HGBA1C 5.2 08/19/2013    Procedures since last visit: No results found.  Assessment/Plan Cough with lethargy Concern for aspiration pneumonia Will order stat chest xray Also order CBC, and CMP Wife does not want to  go to our office for labs so will do it on [facility]  Aspiration with choking of his food D/ w her and advised to be careful when they are feeding him I have offered them speech therapy eval she does not want  that right now  I have told her it would be be really beneficial . She will let me know in next visit  Advanced dementia with behavior disturbances Wife says that they have tried Namenda and Aricept before with no benefit Were followed by neurology before. To  continue Xanax.for now Hypertension Controlled on amlodipine CKD stage III Repeat CMP   Iron deficiency anemia due to Previous h/o GI Bleed Continue iron and repeat CBC Depression Continue on Celexa   Labs/tests ordered:  @ORDERS @ Next appt:  01/05/2019 Total time spent in this patient care encounter was  45_  minutes; greater than 50% of the visit spent counseling patient and her family reviewing records , Labs and coordinating care for problems addressed at this encounter.

## 2018-11-27 ENCOUNTER — Ambulatory Visit
Admission: RE | Admit: 2018-11-27 | Discharge: 2018-11-27 | Disposition: A | Discharge status: Home / Self Care | Payer: PPO | Source: Ambulatory Visit | Attending: Internal Medicine | Admitting: Internal Medicine

## 2018-11-27 ENCOUNTER — Other Ambulatory Visit: Payer: Self-pay

## 2018-11-27 DIAGNOSIS — R059 Cough, unspecified: Secondary | ICD-10-CM

## 2018-11-27 DIAGNOSIS — R05 Cough: Secondary | ICD-10-CM

## 2018-11-27 DIAGNOSIS — R5383 Other fatigue: Secondary | ICD-10-CM

## 2018-11-28 ENCOUNTER — Other Ambulatory Visit: Payer: Self-pay | Admitting: Internal Medicine

## 2018-11-28 MED ORDER — LEVOFLOXACIN 500 MG PO TABS: 500.0000 mg | ORAL_TABLET | Freq: Every day | ORAL | 0 refills | Status: AC

## 2018-11-28 NOTE — Progress Notes (Signed)
Patient Chest Xray was negative but Talked to wife and he is having Productive cough and with rales on my exam I will treat him with Levaquin for 7 days with Probiotics for 10 days. He will get Labs done on Mon.

## 2018-11-29 ENCOUNTER — Other Ambulatory Visit: Payer: Self-pay | Admitting: Internal Medicine

## 2018-11-29 MED ORDER — AZITHROMYCIN 250 MG PO TABS: ORAL_TABLET | ORAL | 0 refills | Status: DC

## 2018-11-29 NOTE — Progress Notes (Signed)
Wife called and she does not want him to take Levaquin due to side effects. Will call him [facility]

## 2018-12-01 ENCOUNTER — Other Ambulatory Visit: Payer: PPO

## 2018-12-01 ENCOUNTER — Other Ambulatory Visit: Payer: Self-pay

## 2018-12-01 DIAGNOSIS — R05 Cough: Secondary | ICD-10-CM

## 2018-12-01 DIAGNOSIS — R059 Cough, unspecified: Secondary | ICD-10-CM

## 2018-12-01 DIAGNOSIS — R5383 Other fatigue: Secondary | ICD-10-CM | POA: Diagnosis not present

## 2018-12-01 DIAGNOSIS — N183 Chronic kidney disease, stage 3 unspecified: Secondary | ICD-10-CM

## 2018-12-01 LAB — CBC WITH DIFFERENTIAL/PLATELET
Absolute Monocytes: 362 cells/uL (ref 200–950)
Basophils Absolute: 20 cells/uL (ref 0–200)
Basophils Relative: 0.4 %
Eosinophils Absolute: 122 cells/uL (ref 15–500)
Eosinophils Relative: 2.4 %
HCT: 32.8 % — ABNORMAL LOW (ref 38.5–50.0)
Hemoglobin: 11.2 g/dL — ABNORMAL LOW (ref 13.2–17.1)
Lymphs Abs: 597 cells/uL — ABNORMAL LOW (ref 850–3900)
MCH: 31.7 pg (ref 27.0–33.0)
MCHC: 34.1 g/dL (ref 32.0–36.0)
MCV: 92.9 fL (ref 80.0–100.0)
MPV: 10.2 fL (ref 7.5–12.5)
Monocytes Relative: 7.1 %
Neutro Abs: 3998 cells/uL (ref 1500–7800)
Neutrophils Relative %: 78.4 %
Platelets: 246 10*3/uL (ref 140–400)
RBC: 3.53 10*6/uL — ABNORMAL LOW (ref 4.20–5.80)
RDW: 13.2 % (ref 11.0–15.0)
Total Lymphocyte: 11.7 %
WBC: 5.1 10*3/uL (ref 3.8–10.8)

## 2018-12-01 LAB — COMPLETE METABOLIC PANEL WITH GFR
AG Ratio: 1 (calc) (ref 1.0–2.5)
ALT: 6 U/L — ABNORMAL LOW (ref 9–46)
AST: 8 U/L — ABNORMAL LOW (ref 10–35)
Albumin: 3.6 g/dL (ref 3.6–5.1)
Alkaline phosphatase (APISO): 75 U/L (ref 35–144)
BUN/Creatinine Ratio: 16 (calc) (ref 6–22)
BUN: 27 mg/dL — ABNORMAL HIGH (ref 7–25)
CO2: 26 mmol/L (ref 20–32)
Calcium: 9 mg/dL (ref 8.6–10.3)
Chloride: 107 mmol/L (ref 98–110)
Creat: 1.67 mg/dL — ABNORMAL HIGH (ref 0.70–1.11)
GFR, Est African American: 39 mL/min/{1.73_m2} — ABNORMAL LOW (ref >=60)
GFR, Est Non African American: 34 mL/min/{1.73_m2} — ABNORMAL LOW (ref >=60)
Globulin: 3.7 g/dL (calc) (ref 1.9–3.7)
Glucose, Bld: 82 mg/dL (ref 65–99)
Potassium: 4.2 mmol/L (ref 3.5–5.3)
Sodium: 141 mmol/L (ref 135–146)
Total Bilirubin: 0.3 mg/dL (ref 0.2–1.2)
Total Protein: 7.3 g/dL (ref 6.1–8.1)

## 2018-12-01 LAB — TSH: TSH: 2 mIU/L (ref 0.40–4.50)

## 2018-12-19 ENCOUNTER — Other Ambulatory Visit: Payer: Self-pay | Admitting: Family

## 2018-12-19 DIAGNOSIS — F1320 Sedative, hypnotic or anxiolytic dependence, uncomplicated: Secondary | ICD-10-CM

## 2018-12-19 DIAGNOSIS — F418 Other specified anxiety disorders: Secondary | ICD-10-CM

## 2018-12-19 NOTE — Telephone Encounter (Signed)
Verified correct pharmacy  Verified [place] data base last filled on 11/18/2018  Next appointment 01/14/2019

## 2018-12-22 ENCOUNTER — Telehealth: Payer: Self-pay | Admitting: Nurse Practitioner

## 2018-12-22 NOTE — Telephone Encounter (Signed)
Problem with refill on husbands meds (xanax) [facility] will not fill per ins til 12/29/18 bc instructions have been changed.  Please ck into this & call  Thanks, Lattie Haw

## 2018-12-22 NOTE — Telephone Encounter (Signed)
Spoke with Pharmacist and she stated that she will give him a call and go ahead and get Rx ready for him.

## 2018-12-24 ENCOUNTER — Non-Acute Institutional Stay: Payer: PPO | Admitting: Internal Medicine

## 2018-12-24 ENCOUNTER — Encounter: Payer: Self-pay | Admitting: Internal Medicine

## 2018-12-24 ENCOUNTER — Other Ambulatory Visit: Payer: Self-pay

## 2018-12-24 VITALS — BP 124/60 | HR 80 | Temp 98.1°F | Ht 67.0 in | Wt 139.4 lb

## 2018-12-24 DIAGNOSIS — R05 Cough: Secondary | ICD-10-CM | POA: Diagnosis not present

## 2018-12-24 DIAGNOSIS — D500 Iron deficiency anemia secondary to blood loss (chronic): Secondary | ICD-10-CM | POA: Diagnosis not present

## 2018-12-24 DIAGNOSIS — G301 Alzheimer's disease with late onset: Secondary | ICD-10-CM

## 2018-12-24 DIAGNOSIS — F0281 Dementia in other diseases classified elsewhere with behavioral disturbance: Secondary | ICD-10-CM | POA: Diagnosis not present

## 2018-12-24 DIAGNOSIS — R059 Cough, unspecified: Secondary | ICD-10-CM

## 2018-12-24 DIAGNOSIS — N183 Chronic kidney disease, stage 3 unspecified: Secondary | ICD-10-CM

## 2018-12-24 NOTE — Progress Notes (Signed)
Location:  [place] of Service:  Clinic (12)  Provider:   Code Status:  Goals of Care:  Advanced Directives 11/26/2018  Does Patient Have a Medical Advance Directive? Yes  Type of Paramedic of [place];Out of facility DNR (pink MOST or yellow form)  Does patient want to make changes to medical advance directive? No - Patient declined  Copy of [place] in Chart? Yes - validated most recent copy scanned in chart (See row information)  Pre-existing out of facility DNR order (yellow form or pink MOST form) Yellow form placed in chart (order not valid for inpatient use)     Chief Complaint  Patient presents with  . Acute Visit    cough and wheezing    HPI: Patient is a 85 y.o. male seen today for Acute visit for Cough With Eating.  Patient came with his wife and the caregiver.  He has advanced dementia and is unable to give me any history. He also has a history of  dementia with behavior disturbances, GERD, GI bleed due to blood thinners, history of TAVR, hypertension, hyperlipidemia, gait disorder, iron deficiency anemia  His wife says he has cough with eating. This is his chronic problem but has worsened recently She noticed some wheezing yesterday but it is now better. He coughs mainly after drinking water.  He has lost Dentures recently and they have not been able to replace them No Fever or SOB  Patient also gets very sleepy when he takes Non Drowzy Claritin but it does helps with his chronic Cough. He is dependent for his ADLS. Has Caregivers 12 hours a day to help the wife      Past Medical History:  Diagnosis Date  . Abnormality of gait 06/07/2016  . Anemia    from AVM's. Requiring transfusion  . Aortic stenosis    s/p TAVI July 2012  . Arthritis   . Balance problem    WEAK ON RIGHT SIDE ( SCOLISOSIS AND HX OF A BACK SURGERY) - BALANCE PROBLEM - "FALLS BACKWARD" - FREQUENT FALLS.  USES CANE WHEN  AMBULATING  . Cancer (HCC)    BLADDER CANCER  . Chronic kidney disease   . Complication of anesthesia    VERY DROWSY FOR HOURS AFTER SURGERY - AND DISORIENTATION  . Dementia ([place])    more memory problems from stroke  . Fall   . GERD (gastroesophageal reflux disease)    RARE - AND NO MEDS  . GI bleed   . Glaucoma   . Glaucoma   . Heart murmur    PT'S CARDIOLOGIST IS DR. Acie Fredrickson -LAST OFFICE VISIT 03/02/13 IN [facility]  . History of blood transfusion LAST 2-3 YRS AGO   HAD 4 OR 5   . History of cerebral infarction 08/19/2013  . Hypercholesteremia   . Hyperlipemia   . Hypertension    Not taking Lisinopril due to BP drops low.  . Memory loss   . Mitral regurgitation   . PONV (postoperative nausea and vomiting)    IN PAST, DID WELL WITH LAST SURGERY  . Shingles May 2013   right arm residual pain- last outbreak 1 1/2 yrs ago.  . Stroke ([place])    left internal capsule 1/15  . Transfusion history    last 2 yrs ago.    Past Surgical History:  Procedure Laterality Date  . AORTIC VALVE REPLACEMENT     02/2011([facility])  . APPENDECTOMY    .  BACK SURGERY  2010 OR 2011   LOWER  . CARDIAC CATHETERIZATION  10/23/2010   ef 65%  . CARDIOVASCULAR STRESS TEST  01/01/2007   EF 64%, NO EVIDENCE OF ISCHEMIA  . CYSTOSCOPY W/ RETROGRADES Bilateral 09/17/2013   Procedure: CYSTOSCOPY WITH BILATERAL RETROGRADE;  Surgeon: [name], MD;  [facility]: [facility];  Service: Urology;  Laterality: Bilateral;  . CYSTOSCOPY W/ RETROGRADES Bilateral 11/15/2015   Procedure: CYSTO, BILATERAL RETROGRADE PYELOGRAM WITH BLADDER BIOPSY;  Surgeon: [name], MD;  [facility];  Service: Urology;  Laterality: Bilateral;  . CYSTOSCOPY WITH STENT PLACEMENT Left 04/13/2013   Procedure:  LEFT URETERAL STENT PLACEMENT;  Surgeon: [name], MD;  [facility];  Service: Urology;  Laterality: Left;  . EYE SURGERY     bilateral cataracts with lens implants  . TRANSURETHRAL RESECTION OF BLADDER TUMOR N/A 06/04/2013    Procedure: RESTAGING TRANSURETHRAL RESECTION OF BLADDER TUMOR (TURBT), URETERAL CATHERIZATION ;  Surgeon: [name], MD;  [facility];  Service: Urology;  Laterality: N/A;  CYSTO RESTAGING TURBT     . TRANSURETHRAL RESECTION OF BLADDER TUMOR N/A 09/17/2013   Procedure: TRANSURETHRAL RESECTION OF BLADDER TUMOR WITH MITOMYCIN-C;  Surgeon: [name], MD;  [facility];  Service: Urology;  Laterality: N/A;  . TRANSURETHRAL RESECTION OF BLADDER TUMOR WITH GYRUS (TURBT-GYRUS) N/A 04/13/2013   Procedure: TRANSURETHRAL RESECTION OF BLADDER TUMOR WITH GYRUS (TURBT-GYRUS);  Surgeon: [name], MD;  [facility];  Service: Urology;  Laterality: N/A;  . US ECHOCARDIOGRAPHY  08/31/10   EF 55-60%    Allergies  Allergen Reactions  . Advil [Ibuprofen] Other (See Comments)    Causes stomach bleeding, need transfusions as result  . Aspirin Other (See Comments)    Causes stomach bleeding, needs transfusion as result  . Penicillins Itching and Swelling    Has patient had a PCN reaction causing immediate rash, facial/tongue/throat swelling, SOB or lightheadedness with hypotension: Unsure Has patient had a PCN reaction causing severe rash involving mucus membranes or skin necrosis: Unsure  Has patient had a PCN reaction that required hospitalization Yes Has patient had a PCN reaction occurring within the last 10 years: No If all of the above answers are "NO", then may proceed with Cephalosporin use.  . Aricept [Donepezil]     Listed on face sheet  . Atorvastatin     Joint ache  . Famciclovir     Listed on face sheet  . Namenda [Memantine]     Stomach pain  . Other Other (See Comments)    No blood thinners due to GI bleed.  . Ativan [Lorazepam] Anxiety    He became very anxious and aggitated    Outpatient Encounter Medications as of 12/24/2018  Medication Sig  . acetaminophen (TYLENOL) 160 MG/5ML liquid 1 tablespoon 0.5 ml by mouth 2 times daily  . ALPRAZolam (XANAX) 0.5 MG tablet SEE  NOTES (Patient taking differently: Take 1/2 every morning and noon then 1 tablet at night)  . amLODipine (NORVASC) 5 MG tablet TAKE 1 TABLET(5 MG) BY MOUTH DAILY  . Cholecalciferol (VITAMIN D3) 50 MCG (2000 UT) CHEW Chew by mouth. daily  . citalopram (CELEXA) 20 MG tablet Take one and a half tablet = 30 mg daily to help with his anxiety.  . iron polysaccharides (NIFEREX) 150 MG capsule Take 150 mg by mouth every morning.   . latanoprost (XALATAN) 0.005 % ophthalmic solution Place 1 drop into both eyes at bedtime.  Marland Kitchen loperamide (IMODIUM A-D) 2  MG tablet Take 2 mg by mouth as needed.   . polyethylene glycol powder (GLYCOLAX/MIRALAX) powder Take 17 g by mouth 2 (two) times daily as needed.  . [DISCONTINUED] cholecalciferol (VITAMIN D) 1000 units tablet Take 1,000 Units by mouth daily.  . [DISCONTINUED] azithromycin (ZITHROMAX) 250 MG tablet Take 2 Tabs first day and then 1 tab for 4 days   No facility-administered encounter medications on file as of 12/24/2018.     Review of Systems:  Review of Systems  Unable to perform ROS: Dementia    Health Maintenance  Topic Date Due  . PNA vac Low Risk Adult (2 of 2 - PCV13) 03/06/2012  . INFLUENZA VACCINE  03/07/2019  . TETANUS/TDAP  06/01/2026    Physical Exam: Vitals:   12/24/18 1622  BP: 124/60  Pulse: 80  Temp: 98.1 F (36.7 C)  SpO2: 97%  Weight: 139 lb 6.4 oz (63.2 kg)  Height: 5\' 7"  (1.702 m)   Body mass index is 21.83 kg/m. Physical Exam Vitals signs reviewed.  Constitutional:      Appearance: Normal appearance.  HENT:     Head: Normocephalic.     Nose: Nose normal.     Mouth/Throat:     Mouth: Mucous membranes are moist.  Eyes:     Pupils: Pupils are equal, round, and reactive to light.  Neck:     Musculoskeletal: Neck supple.  Cardiovascular:     Rate and Rhythm: Normal rate and regular rhythm.     Pulses: Normal pulses.     Heart sounds: Normal heart sounds.  Pulmonary:     Effort: Pulmonary effort is normal. No  respiratory distress.     Breath sounds: Normal breath sounds. No wheezing.     Comments: Some Rales Bilateral Lower Lung area Abdominal:     General: Abdomen is flat. Bowel sounds are normal.     Palpations: Abdomen is soft.  Musculoskeletal:        General: No swelling.  Skin:    General: Skin is warm and dry.  Neurological:     General: No focal deficit present.     Mental Status: He is alert.     Comments: Would not follow all commands. Nods his Head. Has Aphasia. Not oriented Per Wife he is Wheelchair dependent Walks with the walker few steps but with assist     Labs reviewed: Basic Metabolic Panel: Recent Labs    03/05/18 0000 12/01/18 0815  NA 138 141  K 4.5 4.2  CL 104 107  CO2 25 26  GLUCOSE 82 82  BUN 30* 27*  CREATININE 1.47* 1.67*  CALCIUM 9.1 9.0  TSH  --  2.00   Liver Function Tests: Recent Labs    03/05/18 0000 12/01/18 0815  AST 14 8*  ALT 9 6*  BILITOT 0.5 0.3  PROT 7.7 7.3   No results for input(s): LIPASE, AMYLASE in the last 8760 hours. No results for input(s): AMMONIA in the last 8760 hours. CBC: Recent Labs    03/05/18 0000 12/01/18 0815  WBC 4.3 5.1  NEUTROABS  --  3,998  HGB 12.0* 11.2*  HCT 35.3* 32.8*  MCV 93.4 92.9  PLT 191 246   Lipid Panel: Recent Labs    03/05/18 0000  CHOL 177  HDL 45  LDLCALC 114*  TRIG 85  CHOLHDL 3.9   Lab Results  Component Value Date   HGBA1C 5.2 08/19/2013    Procedures since last visit: Dg Chest 2 View  Result Date:  11/27/2018 CLINICAL DATA:  Cough EXAM: CHEST - 2 VIEW COMPARISON:  July 14, 2018 FINDINGS: There is no appreciable edema or consolidation. Heart size and pulmonary vascularity are normal. Patient is status post aortic valve replacement. There is aortic atherosclerosis. No adenopathy. There is mild degenerative change in the thoracic spine. There is arthropathy in the right shoulder with marked narrowing of the glenohumeral joint. IMPRESSION: No appreciable edema or  consolidation. Heart size within normal limits. Patient is status post aortic valve replacement. Aortic Atherosclerosis (ICD10-I70.0). Electronically Signed   By: Lowella Grip III M.D.   On: 11/27/2018 11:37    Assessment/Plan Cough/ Dysphagia Wife has agreed to see Speech Therapy to revaluate him She says they saw him 9 months ago but it is hard to keep up with instructions  D/W her about the risk of aspiration She is aware Patient is definitely more alert and responsive then Last Visit  Advanced dementia with behavior disturbances Wife says that they have tried Namenda and Aricept before with no benefit He had advanced dementia now Continue Supportive care On Xanax PRN Hypertension Controlled on amlodipine CKD stage III Repeat CMP showed creat slightly worst  Iron deficiency anemia due to Previous h/o GI Bleed Continue iron and repeat CBC shows HGB stable Patient not candidate for Aggressive work up Depression Continue on Celexa Weight is stable right now   Labs/tests ordered Speech Therapy Next appt:   4 months  Total time spent in this patient care encounter was  25_  minutes; greater than 50% of the visit spent counseling patient wife and staff, reviewing records , Labs and coordinating care for problems addressed at this encounter.

## 2019-01-05 ENCOUNTER — Other Ambulatory Visit: Payer: Self-pay

## 2019-01-13 ENCOUNTER — Encounter: Payer: Self-pay | Admitting: Family

## 2019-01-14 ENCOUNTER — Encounter: Payer: Medicare Other | Admitting: Internal Medicine

## 2019-01-22 ENCOUNTER — Other Ambulatory Visit: Payer: Self-pay | Admitting: Family

## 2019-01-22 ENCOUNTER — Other Ambulatory Visit: Payer: Self-pay

## 2019-01-22 DIAGNOSIS — F1320 Sedative, hypnotic or anxiolytic dependence, uncomplicated: Secondary | ICD-10-CM

## 2019-01-22 DIAGNOSIS — F418 Other specified anxiety disorders: Secondary | ICD-10-CM

## 2019-01-22 MED ORDER — ALPRAZOLAM 0.5 MG PO TABS: ORAL_TABLET | ORAL | 0 refills | Status: DC

## 2019-01-22 NOTE — Telephone Encounter (Signed)
Called in Rx to pharmacy spoke with Pharmacist Roselyn Reef.

## 2019-02-23 ENCOUNTER — Other Ambulatory Visit: Payer: Self-pay | Admitting: Nurse Practitioner

## 2019-02-23 DIAGNOSIS — F1320 Sedative, hypnotic or anxiolytic dependence, uncomplicated: Secondary | ICD-10-CM

## 2019-02-23 DIAGNOSIS — F418 Other specified anxiety disorders: Secondary | ICD-10-CM

## 2019-02-25 ENCOUNTER — Other Ambulatory Visit: Payer: Self-pay | Admitting: *Deleted

## 2019-02-25 ENCOUNTER — Other Ambulatory Visit: Payer: Self-pay

## 2019-02-25 DIAGNOSIS — F1320 Sedative, hypnotic or anxiolytic dependence, uncomplicated: Secondary | ICD-10-CM

## 2019-02-25 DIAGNOSIS — F418 Other specified anxiety disorders: Secondary | ICD-10-CM

## 2019-02-25 MED ORDER — ALPRAZOLAM 0.5 MG PO TABS: ORAL_TABLET | ORAL | 0 refills | Status: DC

## 2019-02-25 NOTE — Telephone Encounter (Signed)
Phoned medication to pharmacist

## 2019-02-25 NOTE — Telephone Encounter (Signed)
Forwarded to [facility] for approval.  

## 2019-02-25 NOTE — Telephone Encounter (Signed)
[facility] called and requested refill.  Pended and sent to [facility] for approval due to [place].

## 2019-02-25 NOTE — Telephone Encounter (Signed)
Patient wife stopped by the office today and stated that the Alprazolam has to state  Take 1/2 tablet in the morning and afternoon and Take ONE tablet at bedtime in order to get the medications on time and paid by the insurance. Wife stated that this is how the patient has always taken it. Can this be corrected on the Rx before sent to pharmacy. Please Advise.

## 2019-02-25 NOTE — Telephone Encounter (Signed)
Patient wife came in and stated pharmacy did not receive medication.The medication was a  phone in .Please resend reset to normal

## 2019-02-25 NOTE — Telephone Encounter (Signed)
Patient may take Alprazolam 0.5 mg tablet 1/2 tablet by mouth in the morning and afternoon and take one tablet by mouth at bedtime.

## 2019-03-03 ENCOUNTER — Encounter: Payer: Self-pay | Admitting: Family

## 2019-03-03 ENCOUNTER — Ambulatory Visit (INDEPENDENT_AMBULATORY_CARE_PROVIDER_SITE_OTHER): Payer: PPO | Admitting: Family

## 2019-03-03 ENCOUNTER — Other Ambulatory Visit: Payer: Self-pay

## 2019-03-03 DIAGNOSIS — R05 Cough: Secondary | ICD-10-CM

## 2019-03-03 DIAGNOSIS — R058 Other specified cough: Secondary | ICD-10-CM

## 2019-03-03 NOTE — Patient Instructions (Signed)
1. Robitussin 10 cc by mouth every 6 hours as needed for cough.  2. Notify provider if symptoms worsen or running any fever > 100.5   3. Please get chest X-ray done at [facility] on [address] to rule out acute abnormalities.

## 2019-03-03 NOTE — Progress Notes (Signed)
This service is provided via telemedicine  No vital signs collected/recorded due to the encounter was a telemedicine visit.  Location of patient (ex: home, work):  Home  Patient consents to a telephone visit:  Yes   Location of the provider (ex: office, home): Office   Name of any referring provider: Man Mast, NP   Names of all persons participating in the telemedicine service and their role in the encounter:  Marlowe Sax, NP,  Ruthell Rummage CMA, [name] and wife Alice   Time spent on call:  Ruthell Rummage CMA, spent 7  Minutes on phone with patient    Location:      Place of Service:    Provider: Ade Stmarie FNP-C  Mast, Man X, NP  Patient Care Team: Mast, Man X, NP as PCP - General (Internal Medicine) Ronald Lobo, MD (Gastroenterology) Rolm Bookbinder, MD as Consulting Physician (Dermatology) Nahser, [name], MD as Consulting Physician (Cardiology) Kathrynn Ducking, MD as Consulting Physician (Neurology) [name], MD as Attending Physician (Urology) Luberta Mutter, MD as Consulting Physician (Ophthalmology)  Extended Emergency Contact Information Primary Emergency Contact: [facility] Address: [address] [place]          [place], [place] [address] [name] of [place] Phone: [phone] Mobile Phone: [phone] Relation: Spouse Secondary Emergency Contact: [place] , [place] Phone: [phone] Mobile Phone: [phone] Relation: Son  Code Status:  DNR Goals of care: Advanced Directive information Advanced Directives 11/26/2018  Does Patient Have a Medical Advance Directive? Yes  Type of Paramedic of [place];Out of facility DNR (pink MOST or yellow form)  Does patient want to make changes to medical advance directive? No - Patient declined  Copy of [place] in Chart? Yes - validated most recent copy scanned in chart (See row  information)  Pre-existing out of facility DNR order (yellow form or pink MOST form) Yellow form placed in chart (order not valid for inpatient use)     Chief Complaint  Patient presents with  . Acute Visit    Cough with loose mucus no fever, wife Danton Clap states patient coughed all last night   . Medication Management    patient was given half allergy pill, tylenol and cough syrup     HPI:  Pt is a 85 y.o. male seen today for an acute visit for evaluation of worsening cough.He resides at [facility].Wife states cough worsen last night.she gave him an allergy medication and tylenol but no relief.No fever,chills,wheezing or shortness of breath reported.she is worried that patient might have another pneumonia.she has some oral antibiotics that was prescribed by MD in the past to have at hand.she wonders whether she should give him for cough.I've discussed with patient's wife not to give antibiotics until an X-ray is obtained.she verbalized understanding.she states unable to get X-ray done today but will take him in the morning when care giver comes.   Past Medical History:  Diagnosis Date  . Abnormality of gait 06/07/2016  . Anemia    from AVM's. Requiring transfusion  . Aortic stenosis    s/p TAVI July 2012  . Arthritis   . Balance problem    WEAK ON RIGHT SIDE ( SCOLISOSIS AND HX OF A BACK SURGERY) - BALANCE PROBLEM - "FALLS BACKWARD" - FREQUENT FALLS.  USES CANE WHEN AMBULATING  . Cancer (HCC)    BLADDER CANCER  .  Chronic kidney disease   . Complication of anesthesia    VERY DROWSY FOR HOURS AFTER SURGERY - AND DISORIENTATION  . Dementia ([place])    more memory problems from stroke  . Fall   . GERD (gastroesophageal reflux disease)    RARE - AND NO MEDS  . GI bleed   . Glaucoma   . Glaucoma   . Heart murmur    PT'S CARDIOLOGIST IS DR. Acie Fredrickson -LAST OFFICE VISIT 03/02/13 IN [facility]  . History of blood transfusion LAST 2-3 YRS AGO   HAD 4 OR 5   . History of  cerebral infarction 08/19/2013  . Hypercholesteremia   . Hyperlipemia   . Hypertension    Not taking Lisinopril due to BP drops low.  . Memory loss   . Mitral regurgitation   . PONV (postoperative nausea and vomiting)    IN PAST, DID WELL WITH LAST SURGERY  . Shingles May 2013   right arm residual pain- last outbreak 1 1/2 yrs ago.  . Stroke ([place])    left internal capsule 1/15  . Transfusion history    last 2 yrs ago.   Past Surgical History:  Procedure Laterality Date  . AORTIC VALVE REPLACEMENT     02/2011([facility])  . APPENDECTOMY    . BACK SURGERY  2010 OR 2011   LOWER  . CARDIAC CATHETERIZATION  10/23/2010   ef 65%  . CARDIOVASCULAR STRESS TEST  01/01/2007   EF 64%, NO EVIDENCE OF ISCHEMIA  . CYSTOSCOPY W/ RETROGRADES Bilateral 09/17/2013   Procedure: CYSTOSCOPY WITH BILATERAL RETROGRADE;  Surgeon: [name], MD;  [facility];  Service: Urology;  Laterality: Bilateral;  . CYSTOSCOPY W/ RETROGRADES Bilateral 11/15/2015   Procedure: CYSTO, BILATERAL RETROGRADE PYELOGRAM WITH BLADDER BIOPSY;  Surgeon: [name], MD;  [facility];  Service: Urology;  Laterality: Bilateral;  . CYSTOSCOPY WITH STENT PLACEMENT Left 04/13/2013   Procedure:  LEFT URETERAL STENT PLACEMENT;  Surgeon: [name], MD;  [facility];  Service: Urology;  Laterality: Left;  . EYE SURGERY     bilateral cataracts with lens implants  . TRANSURETHRAL RESECTION OF BLADDER TUMOR N/A 06/04/2013   Procedure: RESTAGING TRANSURETHRAL RESECTION OF BLADDER TUMOR (TURBT), URETERAL CATHERIZATION ;  Surgeon: [name], MD;  [facility];  Service: Urology;  Laterality: N/A;  CYSTO RESTAGING TURBT     . TRANSURETHRAL RESECTION OF BLADDER TUMOR N/A 09/17/2013   Procedure: TRANSURETHRAL RESECTION OF BLADDER TUMOR WITH MITOMYCIN-C;  Surgeon: [name], MD;  [facility];  Service: Urology;  Laterality: N/A;  . TRANSURETHRAL RESECTION OF BLADDER TUMOR WITH GYRUS (TURBT-GYRUS) N/A 04/13/2013   Procedure:  TRANSURETHRAL RESECTION OF BLADDER TUMOR WITH GYRUS (TURBT-GYRUS);  Surgeon: [name], MD;  [facility];  Service: Urology;  Laterality: N/A;  . US ECHOCARDIOGRAPHY  08/31/10   EF 55-60%    Allergies  Allergen Reactions  . Advil [Ibuprofen] Other (See Comments)    Causes stomach bleeding, need transfusions as result  . Aspirin Other (See Comments)    Causes stomach bleeding, needs transfusion as result  . Penicillins Itching and Swelling    Has patient had a PCN reaction causing immediate rash, facial/tongue/throat swelling, SOB or lightheadedness with hypotension: Unsure Has patient had a PCN reaction causing severe rash involving mucus membranes or skin necrosis: Unsure  Has patient had a PCN reaction that required hospitalization Yes Has patient had a PCN reaction occurring within the last 10 years: No If all of the  above answers are "NO", then may proceed with Cephalosporin use.  . Aricept [Donepezil]     Listed on face sheet  . Atorvastatin     Joint ache  . Famciclovir     Listed on face sheet  . Namenda [Memantine]     Stomach pain  . Other Other (See Comments)    No blood thinners due to GI bleed.  . Ativan [Lorazepam] Anxiety    He became very anxious and aggitated    Outpatient Encounter Medications as of 03/03/2019  Medication Sig  . acetaminophen (TYLENOL) 160 MG/5ML liquid 1 tablespoon 0.5 ml by mouth 2 times daily  . ALPRAZolam (XANAX) 0.5 MG tablet TAKE 1/2 TABLET BY MOUTH EVERY MORNING AND 1/2 TABLET AT NOON TAKE 1/2 TABLET AT BEDTIME AND MAY TAKE AN ADDITIONAL 1/2 TABLET AT NOON FOR SEVERE  . ALPRAZolam (XANAX) 0.5 MG tablet TAKE 1/2 TABLET BY MOUTH EVERY AM& NOON, THEN ONE TABLET BY MOUTH AT BEDTIME FOR SEVERE ANXIETY  . amLODipine (NORVASC) 5 MG tablet TAKE 1 TABLET(5 MG) BY MOUTH DAILY  . Cholecalciferol (VITAMIN D3) 50 MCG (2000 UT) CHEW Chew by mouth. daily  . citalopram (CELEXA) 20 MG tablet Take one and a half tablet = 30 mg daily to help with his  anxiety.  . iron polysaccharides (NIFEREX) 150 MG capsule Take 150 mg by mouth every morning.   . latanoprost (XALATAN) 0.005 % ophthalmic solution Place 1 drop into both eyes at bedtime.  Marland Kitchen loperamide (IMODIUM A-D) 2 MG tablet Take 2 mg by mouth as needed.   . polyethylene glycol powder (GLYCOLAX/MIRALAX) powder Take 17 g by mouth 2 (two) times daily as needed.   No facility-administered encounter medications on file as of 03/03/2019.     Review of Systems  Unable to perform ROS: Dementia (Information provided by patient's wife )  Constitutional: Negative for appetite change, chills, fatigue and fever.  HENT: Positive for trouble swallowing. Negative for congestion, rhinorrhea, sinus pressure, sinus pain, sneezing and sore throat.   Respiratory: Positive for cough. Negative for chest tightness, shortness of breath and wheezing.   Cardiovascular: Negative for chest pain, palpitations and leg swelling.  Gastrointestinal: Negative for abdominal pain, constipation, diarrhea, nausea and vomiting.  Musculoskeletal: Positive for gait problem.  Skin: Negative for color change, pallor and rash.  Neurological: Negative for dizziness, light-headedness and headaches.  Psychiatric/Behavioral: Positive for confusion. Negative for agitation and sleep disturbance. The patient is not nervous/anxious.     Immunization History  Administered Date(s) Administered  . Influenza Nasal 05/13/2016  . Influenza, High Dose Seasonal PF 05/15/2017  . Influenza,inj,Quad PF,6+ Mos 05/08/2018  . Pneumococcal-Unspecified 03/07/2011  . Tdap 02/18/2015, 06/01/2016   Pertinent  Health Maintenance Due  Topic Date Due  . PNA vac Low Risk Adult (2 of 2 - PCV13) 03/06/2012  . INFLUENZA VACCINE  03/07/2019   Fall Risk  12/24/2018 11/26/2018 11/20/2018 07/08/2018 01/15/2018  Falls in the past year? 0 0 0 0 No  Number falls in past yr: 0 0 0 0 -  Injury with Fall? 0 0 0 0 -   There were no vitals filed for this visit. There  is no height or weight on file to calculate BMI.   Physical Exam   Unable to complete on telephone visit.   Labs reviewed: Recent Labs    03/05/18 0000 12/01/18 0815  NA 138 141  K 4.5 4.2  CL 104 107  CO2 25 26  GLUCOSE 82 82  BUN 30*  27*  CREATININE 1.47* 1.67*  CALCIUM 9.1 9.0   Recent Labs    03/05/18 0000 12/01/18 0815  AST 14 8*  ALT 9 6*  BILITOT 0.5 0.3  PROT 7.7 7.3   Recent Labs    03/05/18 0000 12/01/18 0815  WBC 4.3 5.1  NEUTROABS  --  3,998  HGB 12.0* 11.2*  HCT 35.3* 32.8*  MCV 93.4 92.9  PLT 191 246   Lab Results  Component Value Date   TSH 2.00 12/01/2018   Lab Results  Component Value Date   HGBA1C 5.2 08/19/2013   Lab Results  Component Value Date   CHOL 177 03/05/2018   HDL 45 03/05/2018   LDLCALC 114 (H) 03/05/2018   TRIG 85 03/05/2018   CHOLHDL 3.9 03/05/2018    Significant Diagnostic Results in last 30 days:  No results found.  Assessment/Plan  Nonproductive cough No fever reported.No shortness of breath.Has had no contact with sick persons with COVID-19. Take Over the counter Robitussin 10 cc by mouth every 6 hours as needed for cough. -Notify provider if symptoms worsen or running any fever > 100.5   - DG Chest 2 View; Future rule out acute abnormalities.   Family/ staff Communication: Reviewed plan of care with patient's wife.  Labs/tests ordered:DG Chest 2 View; Future rule out acute abnormalities.  Spent 12 minutes of non-face to face with patient and wife.     Sandrea Hughs, NP

## 2019-03-04 ENCOUNTER — Ambulatory Visit
Admission: RE | Admit: 2019-03-04 | Discharge: 2019-03-04 | Disposition: A | Discharge status: Home / Self Care | Payer: PPO | Source: Ambulatory Visit | Attending: Family | Admitting: Family

## 2019-03-04 DIAGNOSIS — R05 Cough: Secondary | ICD-10-CM

## 2019-03-04 DIAGNOSIS — R058 Other specified cough: Secondary | ICD-10-CM

## 2019-03-05 ENCOUNTER — Telehealth: Payer: Self-pay | Admitting: Internal Medicine

## 2019-03-05 ENCOUNTER — Other Ambulatory Visit: Payer: Self-pay

## 2019-03-05 MED ORDER — DOXYCYCLINE HYCLATE 100 MG PO CAPS: 100.0000 mg | ORAL_CAPSULE | Freq: Two times a day (BID) | ORAL | 0 refills | Status: DC

## 2019-03-05 MED ORDER — SACCHAROMYCES BOULARDII 250 MG PO CAPS: 250.0000 mg | ORAL_CAPSULE | Freq: Two times a day (BID) | ORAL | 0 refills | Status: DC

## 2019-03-05 NOTE — Telephone Encounter (Signed)
Melissa W called [facility] and provided the x ray results today

## 2019-03-05 NOTE — Telephone Encounter (Signed)
Patient's wife called regarding the results of X-ray that was done 03/04/19.

## 2019-03-18 ENCOUNTER — Non-Acute Institutional Stay: Payer: PPO | Admitting: Internal Medicine

## 2019-03-18 ENCOUNTER — Other Ambulatory Visit: Payer: Self-pay

## 2019-03-18 ENCOUNTER — Encounter: Payer: Self-pay | Admitting: Internal Medicine

## 2019-03-18 VITALS — BP 132/58 | HR 58 | Temp 97.1°F | Resp 18 | Ht 67.0 in | Wt 137.8 lb

## 2019-03-18 DIAGNOSIS — G301 Alzheimer's disease with late onset: Secondary | ICD-10-CM

## 2019-03-18 DIAGNOSIS — F0281 Dementia in other diseases classified elsewhere with behavioral disturbance: Secondary | ICD-10-CM | POA: Diagnosis not present

## 2019-03-18 DIAGNOSIS — F02818 Dementia in other diseases classified elsewhere, unspecified severity, with other behavioral disturbance: Secondary | ICD-10-CM

## 2019-03-18 DIAGNOSIS — J189 Pneumonia, unspecified organism: Secondary | ICD-10-CM

## 2019-03-18 DIAGNOSIS — R1310 Dysphagia, unspecified: Secondary | ICD-10-CM | POA: Diagnosis not present

## 2019-03-18 DIAGNOSIS — F418 Other specified anxiety disorders: Secondary | ICD-10-CM | POA: Diagnosis not present

## 2019-03-18 DIAGNOSIS — J181 Lobar pneumonia, unspecified organism: Secondary | ICD-10-CM | POA: Diagnosis not present

## 2019-03-18 NOTE — Progress Notes (Signed)
Location: [place] of Service:  Clinic (12)  Provider:   Code Status:  Goals of Care:  Advanced Directives 11/26/2018  Does Patient Have a Medical Advance Directive? Yes  Type of Paramedic of [place];Out of facility DNR (pink MOST or yellow form)  Does patient want to make changes to medical advance directive? No - Patient declined  Copy of [place] in Chart? Yes - validated most recent copy scanned in chart (See row information)  Pre-existing out of facility DNR order (yellow form or pink MOST form) Yellow form placed in chart (order not valid for inpatient use)     Chief Complaint  Patient presents with  . Medical Management of Chronic Issues    wk f/u    HPI: Patient is a 85 y.o. male seen today for an acute visit for Follow up of Left Lower Lobe Pneumonia Patient came with his wife and the caregiver.He has advanced dementia and is unable to give me any history. Healsohas a history of dementia with behavior disturbances,GERD,GI bleed due to blood thinners, history of TAVR, hypertension, hyperlipidemia, gait disorder,iron deficiency anemia, CKD stage 3 Patient continues to have cough with Eating. He was recently diagnosed with Left Lower lobe pneumonia. He was treated with doxycycline.  And he is doing much better cough is better no fever or chills no shortness of breath.    Past Medical History:  Diagnosis Date  . Abnormality of gait 06/07/2016  . Anemia    from AVM's. Requiring transfusion  . Aortic stenosis    s/p TAVI July 2012  . Arthritis   . Balance problem    WEAK ON RIGHT SIDE ( SCOLISOSIS AND HX OF A BACK SURGERY) - BALANCE PROBLEM - "FALLS BACKWARD" - FREQUENT FALLS.  USES CANE WHEN AMBULATING  . Cancer (HCC)    BLADDER CANCER  . Chronic kidney disease   . Complication of anesthesia    VERY DROWSY FOR HOURS AFTER SURGERY - AND DISORIENTATION  . Dementia ([place])    more memory problems  from stroke  . Fall   . GERD (gastroesophageal reflux disease)    RARE - AND NO MEDS  . GI bleed   . Glaucoma   . Glaucoma   . Heart murmur    PT'S CARDIOLOGIST IS DR. Acie Fredrickson -LAST OFFICE VISIT 03/02/13 IN [facility]  . History of blood transfusion LAST 2-3 YRS AGO   HAD 4 OR 5   . History of cerebral infarction 08/19/2013  . Hypercholesteremia   . Hyperlipemia   . Hypertension    Not taking Lisinopril due to BP drops low.  . Memory loss   . Mitral regurgitation   . PONV (postoperative nausea and vomiting)    IN PAST, DID WELL WITH LAST SURGERY  . Shingles May 2013   right arm residual pain- last outbreak 1 1/2 yrs ago.  . Stroke ([place])    left internal capsule 1/15  . Transfusion history    last 2 yrs ago.    Past Surgical History:  Procedure Laterality Date  . AORTIC VALVE REPLACEMENT     02/2011([facility])  . APPENDECTOMY    . BACK SURGERY  2010 OR 2011   LOWER  . CARDIAC CATHETERIZATION  10/23/2010   ef 65%  . CARDIOVASCULAR STRESS TEST  01/01/2007   EF 64%, NO EVIDENCE OF ISCHEMIA  . CYSTOSCOPY W/ RETROGRADES Bilateral 09/17/2013   Procedure: CYSTOSCOPY WITH BILATERAL RETROGRADE;  Surgeon: Jenny Reichmann  [name], MD;  Location: [facility];  Service: Urology;  Laterality: Bilateral;  . CYSTOSCOPY W/ RETROGRADES Bilateral 11/15/2015   Procedure: CYSTO, BILATERAL RETROGRADE PYELOGRAM WITH BLADDER BIOPSY;  Surgeon: [name], MD;  [facility]: [facility];  Service: Urology;  Laterality: Bilateral;  . CYSTOSCOPY WITH STENT PLACEMENT Left 04/13/2013   Procedure:  LEFT URETERAL STENT PLACEMENT;  Surgeon: [name], MD;  [facility];  Service: Urology;  Laterality: Left;  . EYE SURGERY     bilateral cataracts with lens implants  . TRANSURETHRAL RESECTION OF BLADDER TUMOR N/A 06/04/2013   Procedure: RESTAGING TRANSURETHRAL RESECTION OF BLADDER TUMOR (TURBT), URETERAL CATHERIZATION ;  Surgeon: [name], MD;  [facility];  Service: Urology;  Laterality: N/A;  CYSTO RESTAGING TURBT     .  TRANSURETHRAL RESECTION OF BLADDER TUMOR N/A 09/17/2013   Procedure: TRANSURETHRAL RESECTION OF BLADDER TUMOR WITH MITOMYCIN-C;  Surgeon: [name], MD;  [facility];  Service: Urology;  Laterality: N/A;  . TRANSURETHRAL RESECTION OF BLADDER TUMOR WITH GYRUS (TURBT-GYRUS) N/A 04/13/2013   Procedure: TRANSURETHRAL RESECTION OF BLADDER TUMOR WITH GYRUS (TURBT-GYRUS);  Surgeon: [name], MD;  [facility];  Service: Urology;  Laterality: N/A;  . US ECHOCARDIOGRAPHY  08/31/10   EF 55-60%    Allergies  Allergen Reactions  . Advil [Ibuprofen] Other (See Comments)    Causes stomach bleeding, need transfusions as result  . Aspirin Other (See Comments)    Causes stomach bleeding, needs transfusion as result  . Penicillins Itching and Swelling    Has patient had a PCN reaction causing immediate rash, facial/tongue/throat swelling, SOB or lightheadedness with hypotension: Unsure Has patient had a PCN reaction causing severe rash involving mucus membranes or skin necrosis: Unsure  Has patient had a PCN reaction that required hospitalization Yes Has patient had a PCN reaction occurring within the last 10 years: No If all of the above answers are "NO", then may proceed with Cephalosporin use.  . Aricept [Donepezil]     Listed on face sheet  . Atorvastatin     Joint ache  . Famciclovir     Listed on face sheet  . Namenda [Memantine]     Stomach pain  . Other Other (See Comments)    No blood thinners due to GI bleed.  . Ativan [Lorazepam] Anxiety    He became very anxious and aggitated    Outpatient Encounter Medications as of 03/18/2019  Medication Sig  . acetaminophen (TYLENOL) 160 MG/5ML liquid 1 tablespoon 0.5 ml by mouth 2 times daily  . ALPRAZolam (XANAX) 0.5 MG tablet Take by mouth. TAKE 1/2 TABLET BY MOUTH EVERY MORNING AND 1/2 TABLET AT NOON TAKE 1 TABLET AT BEDTIME  . amLODipine (NORVASC) 5 MG tablet TAKE 1 TABLET(5 MG) BY MOUTH DAILY  . Cholecalciferol (VITAMIN D3) 50 MCG  (2000 UT) CHEW Chew by mouth. daily  . citalopram (CELEXA) 20 MG tablet Take one and a half tablet = 30 mg daily to help with his anxiety.  . iron polysaccharides (NIFEREX) 150 MG capsule Take 150 mg by mouth every morning.   . latanoprost (XALATAN) 0.005 % ophthalmic solution Place 1 drop into both eyes at bedtime.  Marland Kitchen loperamide (IMODIUM A-D) 2 MG tablet Take 2 mg by mouth as needed.   . polyethylene glycol powder (GLYCOLAX/MIRALAX) powder Take 17 g by mouth 2 (two) times daily as needed.  . saccharomyces boulardii (FLORASTOR) 250 MG capsule Take 1 capsule (250 mg total) by mouth 2 (two) times  daily. Take for 10 days  . [DISCONTINUED] ALPRAZolam (XANAX) 0.5 MG tablet TAKE 1/2 TABLET BY MOUTH EVERY MORNING AND 1/2 TABLET AT NOON TAKE 1/2 TABLET AT BEDTIME AND MAY TAKE AN ADDITIONAL 1/2 TABLET AT NOON FOR SEVERE (Patient taking differently: TAKE 1/2 TABLET BY MOUTH EVERY MORNING AND 1/2 TABLET AT NOON TAKE 1 TABLET AT BEDTIME)  . [DISCONTINUED] ALPRAZolam (XANAX) 0.5 MG tablet TAKE 1/2 TABLET BY MOUTH EVERY AM& NOON, THEN ONE TABLET BY MOUTH AT BEDTIME FOR SEVERE ANXIETY  . [DISCONTINUED] doxycycline (VIBRAMYCIN) 100 MG capsule Take 1 capsule (100 mg total) by mouth 2 (two) times daily. Take for 10 days   No facility-administered encounter medications on file as of 03/18/2019.     Review of Systems:  Review of Systems  Unable to perform ROS: Dementia    Health Maintenance  Topic Date Due  . PNA vac Low Risk Adult (2 of 2 - PCV13) 03/06/2012  . INFLUENZA VACCINE  03/07/2019  . TETANUS/TDAP  06/01/2026    Physical Exam: Vitals:   03/18/19 1616  BP: (!) 132/58  Pulse: (!) 58  Resp: 18  Temp: (!) 97.1 F (36.2 C)  SpO2: 94%  Weight: 137 lb 12.8 oz (62.5 kg)  Height: 5\' 7"  (1.702 m)   Body mass index is 21.58 kg/m. Physical Exam Vitals signs reviewed.  Constitutional:      Appearance: Normal appearance.  HENT:     Head: Normocephalic.     Nose: Nose normal.     Mouth/Throat:      Mouth: Mucous membranes are moist.     Pharynx: Oropharynx is clear.  Eyes:     Pupils: Pupils are equal, round, and reactive to light.  Neck:     Musculoskeletal: Neck supple.  Cardiovascular:     Rate and Rhythm: Normal rate and regular rhythm.     Pulses: Normal pulses.     Heart sounds: Murmur present.  Pulmonary:     Effort: Pulmonary effort is normal.     Comments: Has few Rales in Left Lower Lobe Abdominal:     General: Abdomen is flat. Bowel sounds are normal.     Palpations: Abdomen is soft.  Musculoskeletal:     Comments: Trace Swelling Bilateral  Skin:    General: Skin is warm and dry.  Neurological:     General: No focal deficit present.     Mental Status: He is alert.     Comments: Follows simple Commands but mostly Wheelchair dependent  Psychiatric:        Mood and Affect: Mood normal.        Behavior: Behavior normal.        Thought Content: Thought content normal.     Labs reviewed: Basic Metabolic Panel: Recent Labs    12/01/18 0815  NA 141  K 4.2  CL 107  CO2 26  GLUCOSE 82  BUN 27*  CREATININE 1.67*  CALCIUM 9.0  TSH 2.00   Liver Function Tests: Recent Labs    12/01/18 0815  AST 8*  ALT 6*  BILITOT 0.3  PROT 7.3   No results for input(s): LIPASE, AMYLASE in the last 8760 hours. No results for input(s): AMMONIA in the last 8760 hours. CBC: Recent Labs    12/01/18 0815  WBC 5.1  NEUTROABS 3,998  HGB 11.2*  HCT 32.8*  MCV 92.9  PLT 246   Lipid Panel: No results for input(s): CHOL, HDL, LDLCALC, TRIG, CHOLHDL, LDLDIRECT in the last 8760 hours. Lab  Results  Component Value Date   HGBA1C 5.2 08/19/2013    Procedures since last visit: Dg Chest 2 View  Result Date: 03/05/2019 CLINICAL DATA:  Cough EXAM: CHEST - 2 VIEW COMPARISON:  11/27/2018 FINDINGS: Upper normal heart size. Mediastinal contours and pulmonary vascularity normal. Atherosclerotic calcification aorta. LEFT lower lobe atelectasis versus consolidation. Small  associated LEFT pleural effusion. Minimal central bronchitic changes. Remaining lungs clear. No pneumothorax. Bones demineralized. IMPRESSION: Bronchitic changes with atelectasis versus consolidation in LEFT lower lobe. Small associated LEFT pleural effusion. Electronically Signed   By: Lavonia Dana M.D.   On: 03/05/2019 08:27    Assessment/Plan Dysphagia, unspecified type - Plan:  Referral made to [facility] with [facility] Also talked to the wife and caregiver to about his feeding  Community acquired pneumonia of left lower lobe of lung  - Plan: Treated with Doxycyline Doing better  Late onset Alzheimer's disease with behavioral disturbance ([place]) - Plan: Continue Xanax PRN Has tried Namenda and Aricept in past He continues to need higher level of care  Wife has 2 caregivers to help her  Depression with anxiety - Plan:  On celexa Wife says he is stable but if she does not use xanax he gets Combative Other issues Hypertension Controlled on amlodipine CKD stage III Repeat CMPshowed creat slightly worst  Will follow Iron deficiency anemia due toPrevious h/o GI Bleed Continue iron and repeat CBC shows HGB stable Patient not candidate for Aggressive work up  Labs/tests ordered:   Next appt:  06/25/2019  Total time spent in this patient care encounter was  25_  minutes; greater than 50% of the visit spent counseling patient's wife and  reviewing records , Labs and coordinating care for problems addressed at this encounter.

## 2019-03-23 DIAGNOSIS — N184 Chronic kidney disease, stage 4 (severe): Secondary | ICD-10-CM | POA: Diagnosis not present

## 2019-03-23 DIAGNOSIS — E638 Other specified nutritional deficiencies: Secondary | ICD-10-CM | POA: Diagnosis not present

## 2019-03-23 DIAGNOSIS — I10 Essential (primary) hypertension: Secondary | ICD-10-CM | POA: Diagnosis not present

## 2019-03-23 DIAGNOSIS — R42 Dizziness and giddiness: Secondary | ICD-10-CM | POA: Diagnosis not present

## 2019-03-23 DIAGNOSIS — G468 Other vascular syndromes of brain in cerebrovascular diseases: Secondary | ICD-10-CM | POA: Diagnosis not present

## 2019-03-23 DIAGNOSIS — R1312 Dysphagia, oropharyngeal phase: Secondary | ICD-10-CM | POA: Diagnosis not present

## 2019-03-23 DIAGNOSIS — C679 Malignant neoplasm of bladder, unspecified: Secondary | ICD-10-CM | POA: Diagnosis not present

## 2019-03-23 DIAGNOSIS — D509 Iron deficiency anemia, unspecified: Secondary | ICD-10-CM | POA: Diagnosis not present

## 2019-03-23 DIAGNOSIS — E7800 Pure hypercholesterolemia, unspecified: Secondary | ICD-10-CM | POA: Diagnosis not present

## 2019-03-23 DIAGNOSIS — F0390 Unspecified dementia without behavioral disturbance: Secondary | ICD-10-CM | POA: Diagnosis not present

## 2019-03-23 DIAGNOSIS — N401 Enlarged prostate with lower urinary tract symptoms: Secondary | ICD-10-CM | POA: Diagnosis not present

## 2019-04-01 ENCOUNTER — Encounter: Payer: PPO | Admitting: Internal Medicine

## 2019-04-01 ENCOUNTER — Other Ambulatory Visit: Payer: Self-pay

## 2019-04-22 ENCOUNTER — Other Ambulatory Visit: Payer: Self-pay | Admitting: Internal Medicine

## 2019-04-22 DIAGNOSIS — R05 Cough: Secondary | ICD-10-CM

## 2019-04-22 DIAGNOSIS — R059 Cough, unspecified: Secondary | ICD-10-CM

## 2019-04-22 NOTE — Progress Notes (Signed)
Patients wife called concerning Patient cough. She says he was seen by Speech and  they have modified his diet but he keeps on coughing. Will start with ordering Chest Xray. She knows that this can be due to worsening Dysphagia and his dementia with Recurent Aspiration.

## 2019-04-23 ENCOUNTER — Other Ambulatory Visit: Payer: Self-pay | Admitting: Internal Medicine

## 2019-04-23 ENCOUNTER — Ambulatory Visit
Admission: RE | Admit: 2019-04-23 | Discharge: 2019-04-23 | Disposition: A | Discharge status: Home / Self Care | Payer: PPO | Source: Ambulatory Visit | Attending: Internal Medicine | Admitting: Internal Medicine

## 2019-04-23 ENCOUNTER — Other Ambulatory Visit: Payer: Self-pay

## 2019-04-23 DIAGNOSIS — R05 Cough: Secondary | ICD-10-CM | POA: Diagnosis not present

## 2019-04-23 DIAGNOSIS — R059 Cough, unspecified: Secondary | ICD-10-CM

## 2019-04-23 MED ORDER — DOXYCYCLINE HYCLATE 100 MG PO TABS: 100.0000 mg | ORAL_TABLET | Freq: Two times a day (BID) | ORAL | 0 refills | Status: AC

## 2019-04-23 NOTE — Progress Notes (Signed)
Patients Xray is positive for Left Lower Lobe Infiltrate.Reinfection due to Aspiration. D/W the wife again about taking aspiration precautions Will again talk to Speech about Reval him His disease is progressive and his prognosis is Poor Will Treat With Doxycyline 100 mg BID for 10 days

## 2019-05-02 ENCOUNTER — Other Ambulatory Visit: Payer: Self-pay | Admitting: Nurse Practitioner

## 2019-05-04 ENCOUNTER — Other Ambulatory Visit: Payer: Self-pay | Admitting: *Deleted

## 2019-05-04 MED ORDER — ALPRAZOLAM 0.5 MG PO TABS: ORAL_TABLET | ORAL | 0 refills | Status: DC

## 2019-05-04 NOTE — Telephone Encounter (Signed)
[facility]. Pended Rx and sent to Dr. Lyndel Safe for approval.

## 2019-05-28 ENCOUNTER — Telehealth: Payer: Self-pay | Admitting: Internal Medicine

## 2019-05-28 ENCOUNTER — Other Ambulatory Visit: Payer: Self-pay

## 2019-05-28 DIAGNOSIS — F418 Other specified anxiety disorders: Secondary | ICD-10-CM

## 2019-05-28 DIAGNOSIS — F02818 Dementia in other diseases classified elsewhere, unspecified severity, with other behavioral disturbance: Secondary | ICD-10-CM

## 2019-05-28 DIAGNOSIS — F0281 Dementia in other diseases classified elsewhere with behavioral disturbance: Secondary | ICD-10-CM

## 2019-05-28 DIAGNOSIS — G301 Alzheimer's disease with late onset: Secondary | ICD-10-CM

## 2019-05-28 MED ORDER — CITALOPRAM HYDROBROMIDE 20 MG PO TABS: ORAL_TABLET | ORAL | 1 refills | Status: AC

## 2019-05-28 MED ORDER — AMLODIPINE BESYLATE 5 MG PO TABS: ORAL_TABLET | ORAL | 1 refills | Status: AC

## 2019-05-28 NOTE — Telephone Encounter (Signed)
Patient wife called and patient again have symptoms of Aspiration Pneumonia. She wanted me to order Chest Xray and Antibiotics without Office visit.  I have told her that he needs to come to our [facility] office. She has agreed. Also suggested to take him to urgent care I also told her that he needs to be under Hospice care as this is his recurrent infection due to Dysphagia. She has resisted that for now. Has been seen by Speech before. Appointment made with office tomorrow @10  am

## 2019-05-29 ENCOUNTER — Ambulatory Visit (INDEPENDENT_AMBULATORY_CARE_PROVIDER_SITE_OTHER): Payer: PPO | Admitting: Family

## 2019-05-29 ENCOUNTER — Other Ambulatory Visit: Payer: Self-pay

## 2019-05-29 ENCOUNTER — Encounter: Payer: Self-pay | Admitting: Family

## 2019-05-29 DIAGNOSIS — R1310 Dysphagia, unspecified: Secondary | ICD-10-CM

## 2019-05-29 DIAGNOSIS — R059 Cough, unspecified: Secondary | ICD-10-CM

## 2019-05-29 DIAGNOSIS — R05 Cough: Secondary | ICD-10-CM | POA: Diagnosis not present

## 2019-05-29 NOTE — Progress Notes (Addendum)
This service is provided via telemedicine  No vital signs collected/recorded due to the encounter was a telemedicine visit.   Location of patient (ex: home, work):  Home  Patient consents to a telephone visit:  Yes  Location of the provider (ex: office, home): Office.   Name of any referring provider: Dr. Veleta Miners   Names of all persons participating in the telemedicine service and their role in the encounter: Marlowe Sax, NP, Ruthell Rummage CMA, Roselyn Meier and wife Danton Clap   Time spent on call: Ruthell Rummage CMA spent 10 minutes on phone with patient.     [place] [facility]  Provider: Marlowe Sax, NP  Code Status:  DNR Goals of Care:  Advanced Directives 11/26/2018  Does Patient Have a Medical Advance Directive? Yes  Type of Paramedic of [place];Out of facility DNR (pink MOST or yellow form)  Does patient want to make changes to medical advance directive? No - Patient declined  Copy of [place] in Chart? Yes - validated most recent copy scanned in chart (See row information)  Pre-existing out of facility DNR order (yellow form or pink MOST form) Yellow form placed in chart (order not valid for inpatient use)     No chief complaint on file.   HPI: Patient is a 85 y.o. male seen today for an acute visit for cough.Patient's wife states patient had a cough yesterday,slept through his breakfast and lunch but woke up and had his dinner then slept throughout the night.No cough during the night.wife was concerned that patient may have recurrent pneumonia but cough is resolved.she states had called MD yesterday requesting for X-ray to be done but does not think he needs any more since cough has resolved. patient's wife states she has been giving patient robitussin cough syrup.No fever,chills,shortness of breath or wheezing.she states patient is wide awake today and has had his breakfast without any cough.Also states patient did not sleep well  previous night that's why he  might have been sleep yesterday.Chart reviewed,patient was treated with course of Doxycyline 03/18/2019 for pneumonia.Of note,he has a significant medical history of Dysphagia,late Alzheimer's disease with behavioral disturbance among other conditions.He has had a speech therapy for his dysphagia.  Past Medical History:  Diagnosis Date  . Abnormality of gait 06/07/2016  . Anemia    from AVM's. Requiring transfusion  . Aortic stenosis    s/p TAVI July 2012  . Arthritis   . Balance problem    WEAK ON RIGHT SIDE ( SCOLISOSIS AND HX OF A BACK SURGERY) - BALANCE PROBLEM - "FALLS BACKWARD" - FREQUENT FALLS.  USES CANE WHEN AMBULATING  . Cancer (HCC)    BLADDER CANCER  . Chronic kidney disease   . Complication of anesthesia    VERY DROWSY FOR HOURS AFTER SURGERY - AND DISORIENTATION  . Dementia ([place])    more memory problems from stroke  . Fall   . GERD (gastroesophageal reflux disease)    RARE - AND NO MEDS  . GI bleed   . Glaucoma   . Glaucoma   . Heart murmur    PT'S CARDIOLOGIST IS DR. Acie Fredrickson -LAST OFFICE VISIT 03/02/13 IN [facility]  . History of blood transfusion LAST 2-3 YRS AGO   HAD 4 OR 5   . History of cerebral infarction 08/19/2013  . Hypercholesteremia   . Hyperlipemia   . Hypertension    Not taking Lisinopril due to BP drops low.  . Memory loss   . Mitral regurgitation   .  PONV (postoperative nausea and vomiting)    IN PAST, DID WELL WITH LAST SURGERY  . Shingles May 2013   right arm residual pain- last outbreak 1 1/2 yrs ago.  . Stroke ([place])    left internal capsule 1/15  . Transfusion history    last 2 yrs ago.    Past Surgical History:  Procedure Laterality Date  . AORTIC VALVE REPLACEMENT     02/2011([facility])  . APPENDECTOMY    . BACK SURGERY  2010 OR 2011   LOWER  . CARDIAC CATHETERIZATION  10/23/2010   ef 65%  . CARDIOVASCULAR STRESS TEST  01/01/2007   EF 64%, NO EVIDENCE OF ISCHEMIA  . CYSTOSCOPY W/ RETROGRADES Bilateral  09/17/2013   Procedure: CYSTOSCOPY WITH BILATERAL RETROGRADE;  Surgeon: [name], MD;  [facility];  Service: Urology;  Laterality: Bilateral;  . CYSTOSCOPY W/ RETROGRADES Bilateral 11/15/2015   Procedure: CYSTO, BILATERAL RETROGRADE PYELOGRAM WITH BLADDER BIOPSY;  Surgeon: [name], MD;  [facility];  Service: Urology;  Laterality: Bilateral;  . CYSTOSCOPY WITH STENT PLACEMENT Left 04/13/2013   Procedure:  LEFT URETERAL STENT PLACEMENT;  Surgeon: [name], MD;  [facility];  Service: Urology;  Laterality: Left;  . EYE SURGERY     bilateral cataracts with lens implants  . TRANSURETHRAL RESECTION OF BLADDER TUMOR N/A 06/04/2013   Procedure: RESTAGING TRANSURETHRAL RESECTION OF BLADDER TUMOR (TURBT), URETERAL CATHERIZATION ;  Surgeon: [name], MD;  [facility];  Service: Urology;  Laterality: N/A;  CYSTO RESTAGING TURBT     . TRANSURETHRAL RESECTION OF BLADDER TUMOR N/A 09/17/2013   Procedure: TRANSURETHRAL RESECTION OF BLADDER TUMOR WITH MITOMYCIN-C;  Surgeon: [name], MD;  [facility];  Service: Urology;  Laterality: N/A;  . TRANSURETHRAL RESECTION OF BLADDER TUMOR WITH GYRUS (TURBT-GYRUS) N/A 04/13/2013   Procedure: TRANSURETHRAL RESECTION OF BLADDER TUMOR WITH GYRUS (TURBT-GYRUS);  Surgeon: [name], MD;  [facility];  Service: Urology;  Laterality: N/A;  . US ECHOCARDIOGRAPHY  08/31/10   EF 55-60%    Allergies  Allergen Reactions  . Advil [Ibuprofen] Other (See Comments)    Causes stomach bleeding, need transfusions as result  . Aspirin Other (See Comments)    Causes stomach bleeding, needs transfusion as result  . Penicillins Itching and Swelling    Has patient had a PCN reaction causing immediate rash, facial/tongue/throat swelling, SOB or lightheadedness with hypotension: Unsure Has patient had a PCN reaction causing severe rash involving mucus membranes or skin necrosis: Unsure  Has patient had a PCN reaction that required hospitalization  Yes Has patient had a PCN reaction occurring within the last 10 years: No If all of the above answers are "NO", then may proceed with Cephalosporin use.  . Aricept [Donepezil]     Listed on face sheet  . Atorvastatin     Joint ache  . Famciclovir     Listed on face sheet  . Namenda [Memantine]     Stomach pain  . Other Other (See Comments)    No blood thinners due to GI bleed.  . Ativan [Lorazepam] Anxiety    He became very anxious and aggitated    Outpatient Encounter Medications as of 05/29/2019  Medication Sig  . acetaminophen (TYLENOL) 160 MG/5ML liquid 1 tablespoon 0.5 ml by mouth 2 times daily  . ALPRAZolam (XANAX) 0.5 MG tablet Take 1/2 tablet by mouth every morning, Take 1/2 tablet by mouth at noon, Take 1/2 tablet by mouth at bedtime  .  amLODipine (NORVASC) 5 MG tablet TAKE 1 TABLET(5 MG) BY MOUTH DAILY  . Cholecalciferol (VITAMIN D3) 50 MCG (2000 UT) CHEW Chew by mouth. daily  . citalopram (CELEXA) 20 MG tablet Take one and a half tablet = 30 mg daily to help with his anxiety.  . iron polysaccharides (NIFEREX) 150 MG capsule Take 150 mg by mouth every morning.   . latanoprost (XALATAN) 0.005 % ophthalmic solution Place 1 drop into both eyes at bedtime.  Marland Kitchen loperamide (IMODIUM A-D) 2 MG tablet Take 2 mg by mouth as needed.   . polyethylene glycol powder (GLYCOLAX/MIRALAX) powder Take 17 g by mouth 2 (two) times daily as needed.  . saccharomyces boulardii (FLORASTOR) 250 MG capsule Take 1 capsule (250 mg total) by mouth 2 (two) times daily. Take for 10 days   No facility-administered encounter medications on file as of 05/29/2019.     Review of Systems:  Review of Systems  Unable to perform ROS: Dementia (information provided by patient's wife Harvel Ricks )  Constitutional: Negative for appetite change, chills, fatigue and fever.  HENT: Negative for congestion, rhinorrhea, sinus pressure, sinus pain, sneezing and sore throat.   Respiratory: Negative for chest tightness,  shortness of breath and wheezing.        Had cough yesterday but has improved.  Cardiovascular: Negative for chest pain, palpitations and leg swelling.  Gastrointestinal: Negative for abdominal distention, abdominal pain, constipation, diarrhea, nausea and vomiting.  Endocrine: Negative for polydipsia, polyphagia and polyuria.  Genitourinary: Negative for difficulty urinating, dysuria, frequency and urgency.  Musculoskeletal: Positive for gait problem.  Skin: Negative for color change, pallor and rash.  Neurological: Negative for dizziness, light-headedness and headaches.  Psychiatric/Behavioral: Negative for agitation and sleep disturbance. The patient is not nervous/anxious.    Health Maintenance  Topic Date Due  . PNA vac Low Risk Adult (2 of 2 - PCV13) 03/06/2012  . INFLUENZA VACCINE  03/07/2019  . TETANUS/TDAP  06/01/2026    Physical Exam: There were no vitals filed for this visit. There is no height or weight on file to calculate BMI. Physical Exam  Unable to complete on Telephone visit.   Labs reviewed: Basic Metabolic Panel: Recent Labs    12/01/18 0815  NA 141  K 4.2  CL 107  CO2 26  GLUCOSE 82  BUN 27*  CREATININE 1.67*  CALCIUM 9.0  TSH 2.00   Liver Function Tests: Recent Labs    12/01/18 0815  AST 8*  ALT 6*  BILITOT 0.3  PROT 7.3   CBC: Recent Labs    12/01/18 0815  WBC 5.1  NEUTROABS 3,998  HGB 11.2*  HCT 32.8*  MCV 92.9  PLT 246   Lipid Panel: No results for input(s): CHOL, HDL, LDLCALC, TRIG, CHOLHDL, LDLDIRECT in the last 8760 hours. Lab Results  Component Value Date   HGBA1C 5.2 08/19/2013    Procedures since last visit: No results found.  Assessment/Plan  1.Cough in adult Afebrile.Cough has resolved today per patient's wife patient wide awake today.she thinks patient did sleep well previous night that could have contributed to his sleepiness yesterday.She declines chest X-ray for now.she will notify provider if symptoms  recurs.  2. Dysphagia  He remains high risk for aspiration due to dysphagia.Continue with Aspiration precautions.   Labs/tests ordered: None   Next appt:  06/25/2019 with Dr.Gupta   Spent 11 minutes of non-face to face with patient

## 2019-06-02 DIAGNOSIS — Z20828 Contact with and (suspected) exposure to other viral communicable diseases: Secondary | ICD-10-CM | POA: Diagnosis not present

## 2019-06-05 ENCOUNTER — Non-Acute Institutional Stay (SKILLED_NURSING_FACILITY): Payer: PPO | Admitting: Internal Medicine

## 2019-06-05 ENCOUNTER — Encounter: Payer: Self-pay | Admitting: Internal Medicine

## 2019-06-05 DIAGNOSIS — R1310 Dysphagia, unspecified: Secondary | ICD-10-CM | POA: Diagnosis not present

## 2019-06-05 DIAGNOSIS — D500 Iron deficiency anemia secondary to blood loss (chronic): Secondary | ICD-10-CM | POA: Diagnosis not present

## 2019-06-05 DIAGNOSIS — F418 Other specified anxiety disorders: Secondary | ICD-10-CM

## 2019-06-05 DIAGNOSIS — I10 Essential (primary) hypertension: Secondary | ICD-10-CM

## 2019-06-05 DIAGNOSIS — G301 Alzheimer's disease with late onset: Secondary | ICD-10-CM

## 2019-06-05 DIAGNOSIS — E785 Hyperlipidemia, unspecified: Secondary | ICD-10-CM | POA: Diagnosis not present

## 2019-06-05 DIAGNOSIS — N1832 Chronic kidney disease, stage 3b: Secondary | ICD-10-CM | POA: Diagnosis not present

## 2019-06-05 DIAGNOSIS — F0281 Dementia in other diseases classified elsewhere with behavioral disturbance: Secondary | ICD-10-CM

## 2019-06-05 DIAGNOSIS — F02818 Alzheimer's disease with late onset: Secondary | ICD-10-CM

## 2019-06-05 NOTE — Progress Notes (Signed)
Provider:  Virgie Dad, MD Location:    Nursing Home Room Number: 419 Place of Service:  SNF ([phone])  PCP: Virgie Dad, MD   Patient Care Team: Virgie Dad, MD as PCP - General (Internal Medicine) Ronald Lobo, MD (Gastroenterology) Rolm Bookbinder, MD as Consulting Physician (Dermatology) Nahser, [name], MD as Consulting Physician (Cardiology) Kathrynn Ducking, MD as Consulting Physician (Neurology) [name], MD as Attending Physician (Urology) Luberta Mutter, MD as Consulting Physician (Ophthalmology)  Extended Emergency Contact Information Primary Emergency Contact: [facility] Address: [address]          [place], [place] [address] [name] Phone: [phone] Mobile Phone: [phone] Relation: Spouse Secondary Emergency Contact: Hollie Beach          [place] Merla Riches of [place] Phone: [phone] Mobile Phone: [phone] Relation: Son  Code Status: Full Code oals of Care: Advanced Directive information Advanced Directives 11/26/2018  Does Patient Have a Medical Advance Directive? Yes  Type of Paramedic of [place];Out of facility DNR (pink MOST or yellow form)  Does patient want to make changes to medical advance directive? No - Patient declined  Copy of [place] in Chart? Yes - validated most recent copy scanned in chart (See row information)  Pre-existing out of facility DNR order (yellow form or pink MOST form) Yellow form placed in chart (order not valid for inpatient use)      Chief Complaint  Patient presents with  . New Admit To SNF  . Health Maintenance    PCV13    HPI: Patient is a 85 y.o. male seen today for admission to SNF for Observation and [place] a history of dementia with behavior disturbances,GERD,GI bleed due to blood thinners, history of TAVR, hypertension, hyperlipidemia, gait disorder,iron deficiency  anemia, CKD stage 3 And Dysphagia with Recurrent Pneumonia  Patient has a history of end-stage dementia.  He lives in [place] with his wife who is the primary caregiver.  She has company caregivers 12 hours a day.  One of the caregiver tested positive  for Covid.  So the facility has to stop them from coming to his apartment.  She is unable to take care of him.  He is now going to be admitted into the nursing facility for observation and care.  He is going to stay in isolation. Patient had some cough but now is in his room. Per nurses he has not had any fever and has no distress. He was alert.  Would  respond with simple yes and no.  Past Medical History:  Diagnosis Date  . Abnormality of gait 06/07/2016  . Anemia    from AVM's. Requiring transfusion  . Aortic stenosis    s/p TAVI July 2012  . Arthritis   . Balance problem    WEAK ON RIGHT SIDE ( SCOLISOSIS AND HX OF A BACK SURGERY) - BALANCE PROBLEM - "FALLS BACKWARD" - FREQUENT FALLS.  USES CANE WHEN AMBULATING  . Cancer (HCC)    BLADDER CANCER  . Chronic kidney disease   . Complication of anesthesia    VERY DROWSY FOR HOURS AFTER SURGERY - AND DISORIENTATION  . Dementia ([place])    more memory problems from stroke  . Fall   . GERD (gastroesophageal reflux disease)    RARE - AND NO MEDS  . GI bleed   . Glaucoma   . Glaucoma   . Heart murmur  PT'S CARDIOLOGIST IS DR. Acie Fredrickson -LAST OFFICE VISIT 03/02/13 IN [facility]  . History of blood transfusion LAST 2-3 YRS AGO   HAD 4 OR 5   . History of cerebral infarction 08/19/2013  . Hypercholesteremia   . Hyperlipemia   . Hypertension    Not taking Lisinopril due to BP drops low.  . Memory loss   . Mitral regurgitation   . PONV (postoperative nausea and vomiting)    IN PAST, DID WELL WITH LAST SURGERY  . Shingles May 2013   right arm residual pain- last outbreak 1 1/2 yrs ago.  . Stroke ([place])    left internal capsule 1/15  . Transfusion history    last 2 yrs ago.   Past Surgical History:   Procedure Laterality Date  . AORTIC VALVE REPLACEMENT     02/2011([facility])  . APPENDECTOMY    . BACK SURGERY  2010 OR 2011   LOWER  . CARDIAC CATHETERIZATION  10/23/2010   ef 65%  . CARDIOVASCULAR STRESS TEST  01/01/2007   EF 64%, NO EVIDENCE OF ISCHEMIA  . CYSTOSCOPY W/ RETROGRADES Bilateral 09/17/2013   Procedure: CYSTOSCOPY WITH BILATERAL RETROGRADE;  Surgeon: [name], MD;  Location: [facility];  Service: Urology;  Laterality: Bilateral;  . CYSTOSCOPY W/ RETROGRADES Bilateral 11/15/2015   Procedure: CYSTO, BILATERAL RETROGRADE PYELOGRAM WITH BLADDER BIOPSY;  Surgeon: [name], MD;  Location: [facility];  Service: Urology;  Laterality: Bilateral;  . CYSTOSCOPY WITH STENT PLACEMENT Left 04/13/2013   Procedure:  LEFT URETERAL STENT PLACEMENT;  Surgeon: [name], MD;  Location: [facility];  Service: Urology;  Laterality: Left;  . EYE SURGERY     bilateral cataracts with lens implants  . TRANSURETHRAL RESECTION OF BLADDER TUMOR N/A 06/04/2013   Procedure: RESTAGING TRANSURETHRAL RESECTION OF BLADDER TUMOR (TURBT), URETERAL CATHERIZATION ;  Surgeon: [name], MD;  Location: [facility];  Service: Urology;  Laterality: N/A;  CYSTO RESTAGING TURBT     . TRANSURETHRAL RESECTION OF BLADDER TUMOR N/A 09/17/2013   Procedure: TRANSURETHRAL RESECTION OF BLADDER TUMOR WITH MITOMYCIN-C;  Surgeon: [name], MD;  Location: [facility];  Service: Urology;  Laterality: N/A;  . TRANSURETHRAL RESECTION OF BLADDER TUMOR WITH GYRUS (TURBT-GYRUS) N/A 04/13/2013   Procedure: TRANSURETHRAL RESECTION OF BLADDER TUMOR WITH GYRUS (TURBT-GYRUS);  Surgeon: [name], MD;  Location: [facility];  Service: Urology;  Laterality: N/A;  . US ECHOCARDIOGRAPHY  08/31/10   EF 55-60%    reports that he quit smoking about 37 years ago. His smoking use included pipe. He has never used smokeless tobacco. He reports previous alcohol use of about 1.0 standard drinks of alcohol per week. He reports that he does not use drugs. Social History    Socioeconomic History  . Marital status: Married    Spouse name: Not on file  . Number of children: 2  . Years of education: College  . Highest education level: Not on file  Occupational History  . Occupation: retired Copy  . Financial resource strain: Not on file  . Food insecurity    Worry: Not on file    Inability: Not on file  . Transportation needs    Medical: Not on file    Non-medical: Not on file  Tobacco Use  . Smoking status: Former Smoker    Types: Pipe    Quit date: 07/04/1981    Years since quitting: 37.9  . Smokeless tobacco: Never Used  Substance and Sexual Activity  . Alcohol  use: Not Currently    Alcohol/week: 1.0 standard drinks    Types: 1 Glasses of wine per week  . Drug use: No  . Sexual activity: Not Currently  Lifestyle  . Physical activity    Days per week: Not on file    Minutes per session: Not on file  . Stress: Not on file  Relationships  . Social Herbalist on phone: Not on file    Gets together: Not on file    Attends religious service: Not on file    Active member of club or organization: Not on file    Attends meetings of clubs or organizations: Not on file    Relationship status: Not on file  . Intimate partner violence    Fear of current or ex partner: Not on file    Emotionally abused: Not on file    Physically abused: Not on file    Forced sexual activity: Not on file  Other Topics Concern  . Not on file  Social History Narrative   [facility]   Patient lives at home with his wife has 2 children.   Married 1960.   Patient is left handed.   Patient has college education.   Patient drink 4 cups daily.   Smoked pipe 20-35 years    Exercise no       Functional Status Survey:    Family History  Problem Relation Age of Onset  . COPD Father   . Stroke Father   . Parkinson's disease Brother 44  . Heart failure Mother   . Bowel Disease Sister 68    Health  Maintenance  Topic Date Due  . PNA vac Low Risk Adult (2 of 2 - PCV13) 03/06/2012  . TETANUS/TDAP  06/01/2026  . INFLUENZA VACCINE  Completed    Allergies  Allergen Reactions  . Advil [Ibuprofen] Other (See Comments)    Causes stomach bleeding, need transfusions as result  . Aspirin Other (See Comments)    Causes stomach bleeding, needs transfusion as result  . Penicillins Itching and Swelling    Has patient had a PCN reaction causing immediate rash, facial/tongue/throat swelling, SOB or lightheadedness with hypotension: Unsure Has patient had a PCN reaction causing severe rash involving mucus membranes or skin necrosis: Unsure  Has patient had a PCN reaction that required hospitalization Yes Has patient had a PCN reaction occurring within the last 10 years: No If all of the above answers are "NO", then may proceed with Cephalosporin use.  . Aricept [Donepezil]     Listed on face sheet  . Atorvastatin     Joint ache  . Famciclovir     Listed on face sheet  . Namenda [Memantine]     Stomach pain  . Other Other (See Comments)    No blood thinners due to GI bleed.  . Sulfa Antibiotics   . Ativan [Lorazepam] Anxiety    He became very anxious and aggitated    Allergies as of 06/05/2019      Reactions   Advil [ibuprofen] Other (See Comments)   Causes stomach bleeding, need transfusions as result   Aspirin Other (See Comments)   Causes stomach bleeding, needs transfusion as result   Penicillins Itching, Swelling   Has patient had a PCN reaction causing immediate rash, facial/tongue/throat swelling, SOB or lightheadedness with hypotension: Unsure Has patient had a PCN reaction causing severe rash involving mucus membranes or skin necrosis: Unsure  Has patient  had a PCN reaction that required hospitalization Yes Has patient had a PCN reaction occurring within the last 10 years: No If all of the above answers are "NO", then may proceed with Cephalosporin use.   Aricept [donepezil]     Listed on face sheet   Atorvastatin    Joint ache   Famciclovir    Listed on face sheet   Namenda [memantine]    Stomach pain   Other Other (See Comments)   No blood thinners due to GI bleed.   Sulfa Antibiotics    Ativan [lorazepam] Anxiety   He became very anxious and aggitated      Medication List       Accurate as of June 05, 2019 11:46 AM. If you have any questions, ask your nurse or doctor.        STOP taking these medications   loperamide 2 MG tablet Commonly known as: IMODIUM A-D Stopped by: Virgie Dad, MD   polyethylene glycol powder 17 GM/SCOOP powder Commonly known as: GLYCOLAX/MIRALAX Stopped by: Virgie Dad, MD     TAKE these medications   acetaminophen 160 MG/5ML liquid Commonly known as: TYLENOL 1 tablespoon 0.5 ml by mouth 2 times daily   ALPRAZolam 0.25 MG tablet Commonly known as: XANAX Take 0.25 mg by mouth. Twice A Day MORNING 07:00 AM, NOON 12:00 PM What changed: Another medication with the same name was removed. Continue taking this medication, and follow the directions you see here. Changed by: Virgie Dad, MD   ALPRAZolam 0.5 MG tablet Commonly known as: XANAX Take 0.5 mg by mouth at bedtime as needed for anxiety. At Bedtime  BEDTIME 07:00 PM - 10:00 PM What changed: Another medication with the same name was removed. Continue taking this medication, and follow the directions you see here. Changed by: Virgie Dad, MD   amLODipine 5 MG tablet Commonly known as: NORVASC TAKE 1 TABLET(5 MG) BY MOUTH DAILY   citalopram 20 MG tablet Commonly known as: CELEXA Take one and a half tablet = 30 mg daily to help with his anxiety.   iron polysaccharides 150 MG capsule Commonly known as: NIFEREX Take 150 mg by mouth every morning.   latanoprost 0.005 % ophthalmic solution Commonly known as: XALATAN Place 1 drop into both eyes at bedtime.   psyllium 58.6 % packet Commonly known as: METAMUCIL Take 1 packet by mouth daily.    Tubersol 5 UNIT/0.1ML injection Generic drug: tuberculin Inject into the skin once. Start taking on: June 18, 2019   Vitamin D3 50 MCG (2000 UT) Chew Chew by mouth. daily       Review of Systems  Unable to perform ROS: Dementia    Vitals:   06/05/19 1105  BP: 134/88  Pulse: 76  Resp: 20  Temp: (!) 97.5 F (36.4 C)  SpO2: 93%  Height: 5\' 7"  (1.702 m)   Body mass index is 21.58 kg/m. Physical Exam Vitals signs reviewed.  Constitutional:      Appearance: Normal appearance.  HENT:     Head: Normocephalic.     Nose: Nose normal.     Mouth/Throat:     Mouth: Mucous membranes are moist.  Eyes:     Pupils: Pupils are equal, round, and reactive to light.  Neck:     Musculoskeletal: Neck supple.  Cardiovascular:     Rate and Rhythm: Normal rate and regular rhythm.     Pulses: Normal pulses.     Heart sounds: Normal heart  sounds.  Pulmonary:     Effort: Pulmonary effort is normal. No respiratory distress.     Breath sounds: Normal breath sounds. No wheezing or rales.  Abdominal:     General: Abdomen is flat. Bowel sounds are normal.     Palpations: Abdomen is soft.  Musculoskeletal:        General: No swelling.  Skin:    General: Skin is warm and dry.  Neurological:     Mental Status: He is alert.     Comments: Does not follow any commands Sometimes answers Per nurses he is completely dependent for his ADLs  Psychiatric:        Mood and Affect: Mood normal.        Thought Content: Thought content normal.        Judgment: Judgment normal.     Labs reviewed: Basic Metabolic Panel: Recent Labs    12/01/18 0815  NA 141  K 4.2  CL 107  CO2 26  GLUCOSE 82  BUN 27*  CREATININE 1.67*  CALCIUM 9.0   Liver Function Tests: Recent Labs    12/01/18 0815  AST 8*  ALT 6*  BILITOT 0.3  PROT 7.3   No results for input(s): LIPASE, AMYLASE in the last 8760 hours. No results for input(s): AMMONIA in the last 8760 hours. CBC: Recent Labs     12/01/18 0815  WBC 5.1  NEUTROABS 3,998  HGB 11.2*  HCT 32.8*  MCV 92.9  PLT 246   Cardiac Enzymes: No results for input(s): CKTOTAL, CKMB, CKMBINDEX, TROPONINI in the last 8760 hours. BNP: Invalid input(s): POCBNP Lab Results  Component Value Date   HGBA1C 5.2 08/19/2013   Lab Results  Component Value Date   TSH 2.00 12/01/2018   Lab Results  Component Value Date   VITAMINB12 1,314 (H) 11/17/2013   No results found for: FOLATE No results found for: IRON, TIBC, FERRITIN  Imaging and Procedures obtained prior to SNF admission: Dg Chest 2 View  Result Date: 04/23/2019 CLINICAL DATA:  Cough, recent pneumonia EXAM: CHEST - 2 VIEW COMPARISON:  03/04/2019 FINDINGS: Cardiomegaly status post aortic valve stent endograft repair. Aortic atherosclerosis. There is persistent bibasilar heterogeneous airspace opacity seen on lateral view. A previously seen small left pleural effusion has however resolved. Disc degenerative disease of the thoracic spine. IMPRESSION: 1. There is persistent bibasilar heterogeneous airspace opacity seen on lateral view, concerning for persistent or recurrent infection or aspiration. A previously seen small left pleural effusion has however resolved. 2.  Cardiomegaly. Electronically Signed   By: Eddie Candle M.D.   On: 04/23/2019 16:25    Assessment/Plan Late onset Alzheimer's disease with behavioral disturbance ([place]) On Xanax. But he has been getting Sleepy during daytime Will make it PRN for now Has failed Namenda and Aricept in past Essential hypertension On Norvasc  Dysphagia, unspecified type Wife has agreed for Speech to evaluate him here  Depression with anxiety On Celexa  Hyperlipidemia, unspecified hyperlipidemia type No Aggressive treatment  Iron deficiency anemia due to chronic blood loss On Iron supplement Repeat CBC  Stage 3b chronic kidney disease Creat Stable Repeat CMP  ACP Will try to discuss hospice with Wife again before  discharge    Family/ staff Communication:   Labs/tests ordered: CBC CMP, TSH  Total time spent in this patient care encounter was  _45  minutes; greater than 50% of the visit spent counseling patient and staff, reviewing records , Labs and coordinating care for problems addressed at this encounter.

## 2019-06-08 DIAGNOSIS — R509 Fever, unspecified: Secondary | ICD-10-CM | POA: Diagnosis not present

## 2019-06-08 DIAGNOSIS — I499 Cardiac arrhythmia, unspecified: Secondary | ICD-10-CM | POA: Diagnosis not present

## 2019-06-08 DIAGNOSIS — R404 Transient alteration of awareness: Secondary | ICD-10-CM | POA: Diagnosis not present

## 2019-06-25 ENCOUNTER — Other Ambulatory Visit: Payer: Self-pay

## 2019-07-01 ENCOUNTER — Encounter: Payer: Self-pay | Admitting: Internal Medicine

## 2019-07-07 DIAGNOSIS — 419620001 Death: Secondary | SNOMED CT | POA: Diagnosis not present

## 2019-07-07 DEATH — deceased
